# Patient Record
Sex: Male | Born: 1940
Health system: Southern US, Community
[De-identification: ages and names within clinical notes are randomized; demographics above are authoritative.]

## PROBLEM LIST (undated history)

## (undated) DIAGNOSIS — I10 Essential (primary) hypertension: Secondary | ICD-10-CM

## (undated) DIAGNOSIS — I2510 Atherosclerotic heart disease of native coronary artery without angina pectoris: Secondary | ICD-10-CM

## (undated) DIAGNOSIS — I213 ST elevation (STEMI) myocardial infarction of unspecified site: Secondary | ICD-10-CM

## (undated) DIAGNOSIS — S060X9A Concussion with loss of consciousness of unspecified duration, initial encounter: Secondary | ICD-10-CM

## (undated) DIAGNOSIS — M1990 Unspecified osteoarthritis, unspecified site: Secondary | ICD-10-CM

## (undated) DIAGNOSIS — D649 Anemia, unspecified: Secondary | ICD-10-CM

## (undated) DIAGNOSIS — K5790 Diverticulosis of intestine, part unspecified, without perforation or abscess without bleeding: Secondary | ICD-10-CM

## (undated) DIAGNOSIS — D72820 Lymphocytosis (symptomatic): Secondary | ICD-10-CM

## (undated) DIAGNOSIS — I609 Nontraumatic subarachnoid hemorrhage, unspecified: Secondary | ICD-10-CM

## (undated) DIAGNOSIS — I4510 Unspecified right bundle-branch block: Secondary | ICD-10-CM

## (undated) DIAGNOSIS — K635 Polyp of colon: Secondary | ICD-10-CM

## (undated) DIAGNOSIS — Z9889 Other specified postprocedural states: Secondary | ICD-10-CM

## (undated) DIAGNOSIS — E781 Pure hyperglyceridemia: Secondary | ICD-10-CM

## (undated) DIAGNOSIS — G4733 Obstructive sleep apnea (adult) (pediatric): Secondary | ICD-10-CM

## (undated) DIAGNOSIS — N200 Calculus of kidney: Secondary | ICD-10-CM

## (undated) DIAGNOSIS — R112 Nausea with vomiting, unspecified: Secondary | ICD-10-CM

## (undated) DIAGNOSIS — S92309A Fracture of unspecified metatarsal bone(s), unspecified foot, initial encounter for closed fracture: Secondary | ICD-10-CM

## (undated) DIAGNOSIS — S066X9A Traumatic subarachnoid hemorrhage with loss of consciousness of unspecified duration, initial encounter: Secondary | ICD-10-CM

## (undated) DIAGNOSIS — R161 Splenomegaly, not elsewhere classified: Secondary | ICD-10-CM

## (undated) DIAGNOSIS — C8307 Small cell B-cell lymphoma, spleen: Secondary | ICD-10-CM

## (undated) HISTORY — PX: CHOLECYSTECTOMY: SHX55

## (undated) HISTORY — DX: Unspecified right bundle-branch block: I45.10

## (undated) HISTORY — PX: CARDIAC CATHETERIZATION: SHX172

## (undated) HISTORY — PX: TONSILLECTOMY AND ADENOIDECTOMY: SUR1326

## (undated) HISTORY — DX: Small cell b-cell lymphoma, spleen: C83.07

## (undated) HISTORY — DX: Calculus of kidney: N20.0

## (undated) HISTORY — PX: LUMBAR DISC SURGERY: SHX700

## (undated) HISTORY — DX: Fracture of unspecified metatarsal bone(s), unspecified foot, initial encounter for closed fracture: S92.309A

## (undated) HISTORY — DX: Diverticulosis of intestine, part unspecified, without perforation or abscess without bleeding: K57.90

## (undated) HISTORY — PX: HERNIA REPAIR: SHX51

## (undated) HISTORY — DX: Concussion with loss of consciousness of unspecified duration, initial encounter: S06.0X9A

## (undated) HISTORY — DX: Pure hyperglyceridemia: E78.1

## (undated) HISTORY — DX: ST elevation (STEMI) myocardial infarction of unspecified site: I21.3

## (undated) HISTORY — DX: Obstructive sleep apnea (adult) (pediatric): G47.33

## (undated) HISTORY — DX: Nontraumatic subarachnoid hemorrhage, unspecified: I60.9

## (undated) HISTORY — DX: Lymphocytosis (symptomatic): D72.820

## (undated) HISTORY — DX: Polyp of colon: K63.5

## (undated) HISTORY — DX: Splenomegaly, not elsewhere classified: R16.1

## (undated) HISTORY — PX: JOINT REPLACEMENT: SHX530

## (undated) HISTORY — DX: Essential (primary) hypertension: I10

---
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
## 2002-02-07 ENCOUNTER — Encounter: Admission: RE | Admit: 2002-02-07 | Discharge: 2002-02-07 | Payer: Self-pay | Admitting: Family Medicine

## 2002-02-07 ENCOUNTER — Encounter: Payer: Self-pay | Admitting: Family Medicine

## 2002-02-18 ENCOUNTER — Encounter: Admission: RE | Admit: 2002-02-18 | Discharge: 2002-02-18 | Payer: Self-pay | Admitting: Urology

## 2002-02-18 ENCOUNTER — Encounter: Payer: Self-pay | Admitting: Urology

## 2002-03-11 ENCOUNTER — Encounter: Admission: RE | Admit: 2002-03-11 | Discharge: 2002-03-11 | Payer: Self-pay | Admitting: Urology

## 2002-03-11 ENCOUNTER — Encounter: Payer: Self-pay | Admitting: Urology

## 2002-08-25 ENCOUNTER — Encounter: Admission: RE | Admit: 2002-08-25 | Discharge: 2002-08-25 | Payer: Self-pay | Admitting: Family Medicine

## 2002-08-25 ENCOUNTER — Encounter: Payer: Self-pay | Admitting: Family Medicine

## 2002-08-28 ENCOUNTER — Encounter: Payer: Self-pay | Admitting: Family Medicine

## 2002-08-28 ENCOUNTER — Encounter: Admission: RE | Admit: 2002-08-28 | Discharge: 2002-08-28 | Payer: Self-pay | Admitting: Family Medicine

## 2003-02-14 ENCOUNTER — Ambulatory Visit (HOSPITAL_COMMUNITY): Admission: RE | Admit: 2003-02-14 | Discharge: 2003-02-14 | Payer: Self-pay | Admitting: Neurosurgery

## 2003-02-14 ENCOUNTER — Encounter: Payer: Self-pay | Admitting: Neurosurgery

## 2003-03-13 ENCOUNTER — Encounter: Payer: Self-pay | Admitting: Neurosurgery

## 2003-03-13 ENCOUNTER — Encounter: Admission: RE | Admit: 2003-03-13 | Discharge: 2003-03-13 | Payer: Self-pay | Admitting: Neurosurgery

## 2003-03-13 ENCOUNTER — Encounter: Payer: Self-pay | Admitting: Diagnostic Radiology

## 2003-03-27 ENCOUNTER — Encounter: Payer: Self-pay | Admitting: Neurosurgery

## 2003-03-27 ENCOUNTER — Encounter: Admission: RE | Admit: 2003-03-27 | Discharge: 2003-03-27 | Payer: Self-pay | Admitting: Neurosurgery

## 2003-05-08 ENCOUNTER — Encounter: Payer: Self-pay | Admitting: Neurosurgery

## 2003-05-08 ENCOUNTER — Encounter: Admission: RE | Admit: 2003-05-08 | Discharge: 2003-05-08 | Payer: Self-pay | Admitting: Neurosurgery

## 2005-04-05 ENCOUNTER — Ambulatory Visit: Payer: Self-pay | Admitting: Gastroenterology

## 2005-04-27 ENCOUNTER — Ambulatory Visit: Payer: Self-pay | Admitting: Gastroenterology

## 2006-09-12 ENCOUNTER — Ambulatory Visit: Payer: Self-pay | Admitting: Internal Medicine

## 2006-09-20 ENCOUNTER — Ambulatory Visit: Payer: Self-pay | Admitting: Cardiology

## 2006-12-20 ENCOUNTER — Ambulatory Visit: Payer: Self-pay | Admitting: Gastroenterology

## 2007-01-08 ENCOUNTER — Encounter (INDEPENDENT_AMBULATORY_CARE_PROVIDER_SITE_OTHER): Payer: Self-pay | Admitting: Gastroenterology

## 2007-01-08 ENCOUNTER — Ambulatory Visit: Payer: Self-pay | Admitting: Gastroenterology

## 2007-11-11 ENCOUNTER — Ambulatory Visit (HOSPITAL_COMMUNITY): Admission: RE | Admit: 2007-11-11 | Discharge: 2007-11-11 | Payer: Self-pay | Admitting: Family Medicine

## 2008-01-02 ENCOUNTER — Ambulatory Visit (HOSPITAL_COMMUNITY): Admission: RE | Admit: 2008-01-02 | Discharge: 2008-01-03 | Payer: Self-pay | Admitting: Neurosurgery

## 2008-01-12 ENCOUNTER — Inpatient Hospital Stay (HOSPITAL_COMMUNITY): Admission: EM | Admit: 2008-01-12 | Discharge: 2008-01-17 | Payer: Self-pay | Admitting: Emergency Medicine

## 2008-02-20 ENCOUNTER — Encounter: Admission: RE | Admit: 2008-02-20 | Discharge: 2008-02-20 | Payer: Self-pay | Admitting: Neurosurgery

## 2008-06-24 ENCOUNTER — Encounter: Admission: RE | Admit: 2008-06-24 | Discharge: 2008-06-24 | Payer: Self-pay | Admitting: Neurosurgery

## 2010-12-27 NOTE — Op Note (Signed)
NAME:  Marcus Taylor, SCHULKE NO.:  [id number]   MEDICAL RECORD NO.:  [id number]          PATIENT TYPE:  INP   LOCATION:  3029                         FACILITY:  [name]   PHYSICIAN:  Reinaldo Meeker, M.D. DATE OF BIRTH:  [birth year]   DATE OF PROCEDURE:  01/02/2008  DATE OF DISCHARGE:                               OPERATIVE REPORT   PREOPERATIVE DIAGNOSIS:  Herniated disk L3-L4, central.   POSTOPERATIVE DIAGNOSIS:  Herniated disk L3-L4, central.   PROCEDURES:  Bilateral L3-L4 microdiskectomy followed by L3-L4  interspinous fusion with bony spacer, autologous bone, and Osteocel  followed by L3-L4 nonsegmental instrumentation with spinous process  plate.   SURGEON:  Reinaldo Meeker, MD   ASSISTANT:  Donalee Citrin, MD   PROCEDURE IN DETAIL:  After placing in the prone position, the patient's  back was prepped and draped in usual sterile fashion.  Localizing  fluoroscopy was used prior to incision to identify the appropriate  level.  A midline incision was made above the spinous processes of L3  and L4.  Using Bovie cutting current, an incision was carried on the  spinous processes.  Subperiosteal dissection was then carried out  bilaterally along the spinous processes and lamina.  Self-retaining  retractor was placed for exposure.  X-ray showed approach to the  appropriate level.  Starting on the patient's left side, generous  laminotomy was performed by removing the inferior one-half of the L3  lamina, the medial one-third of the section of the superior one-half of  the L4 lamina.  Residual bone was removed and saved for use later in the  case.  Ligamentum flavum was removed in a piecemeal fashion.  Similar  decompression was then carried on the right, once again removing the  inferior half of the L3 lamina and medial one-third of the section of  the superior one-half of the L4 lamina.  Once again, residual bone was  removed and saved for use later in the case.   Interspinous ligament at  this time was removed.  At this time, microdiskectomy was carried out  starting the patient's right side.  Thorough disk space clean-up was  carried out with great care being paid to the midline at the disk space  and just superior to where large fragments of disk materials were  removed.  Progressively, more relaxation of the thecal sac was obtained.  Attention was then turned to the opposite side.  Once again,  microdiskectomy was carried out after coagulating the areas and cutting  it.  Once again, thorough disk space clean-up was carried out.  On the  same time, great care was taken to avoid injury to the neural elements  and this was successfully done.  At this point, inspections were carried  out in all directions for any evidence of significant residual  compression and noncompliance identified.  Large amounts of irrigation  were carried out and any bleeding controlled with bipolar coagulation  and Gelfoam.  Sizer for the interspinous fusion was carried out and  appropriate size was obtained.  The spacer was then placed without  difficulty.  Autologous  bone and Osteocel were placed on top of the  spacer and through the spinous processes around them to help with the  fusion.  An appropriate length interspinous plate was then chosen.  Once  confirming good location under fluoroscopy, the plate was clamped tight,  and final fluoroscopy in the AP and lateral direction showed the plate  and spacer all to be in good position.  Large amounts of irrigation  had already been carried out.  The wound was then closed in multiple  layers of Vicryl and the muscle fascia, subcutaneous and subcuticular  tissues, and staples were placed on the skin.  A sterile dressing was  then applied, and the patient was extubated and taken to recovery room  in stable condition.           ______________________________  Reinaldo Meeker, M.D.     ROK/MEDQ  D:  01/02/2008  T:   01/03/2008  Job:  [id number]

## 2010-12-27 NOTE — Discharge Summary (Signed)
NAMECOBURN, KNAUS NO.:  [id number]   MEDICAL RECORD NO.:  [id number]          PATIENT TYPE:  INP   LOCATION:  3009                         FACILITY:  [name]   PHYSICIAN:  Reinaldo Meeker, M.D. DATE OF BIRTH:  Nov 12, 1940   DATE OF ADMISSION:  01/12/2008  DATE OF DISCHARGE:  01/17/2008                               DISCHARGE SUMMARY   PRIMARY DIAGNOSIS:  Postoperative wound infection.   PRIMARY OPERATIVE PROCEDURE:  I&D of lumbar wound, extension of lumbar  decompression, and removal of instrumentation.   HISTORY:  [name] is a 70 year old gentleman who is approximately  10 days out from an L3 diskectomy fusion and intraspinous plating.  He  developed some drainage from his wound consistent with a wound  infection.  He was admitted and taken to the operating room where he  underwent I&D of his lumbar wound and extension of his lumbar  decompression.  The patient tolerated the procedure well and was placed  on broad-spectrum antibiotics.  Over subsequent days, the wound looked  much better.  He had a PICC line placed.  The wound grew staph species  so that he be discharged home with outpatient vancomycin.  He is feeling  much better than he was at the time of admission.   Plan was for the patient to return to the office in 5 days time for a  wound check but his condition was markedly improved.           ______________________________  Reinaldo Meeker, M.D.     ROK/MEDQ  D:  02/20/2008  T:  02/21/2008  Job:  [id number]

## 2010-12-27 NOTE — Op Note (Signed)
Marcus Taylor, SCHEAFFER NO.:  [id number]   MEDICAL RECORD NO.:  [id number]          PATIENT TYPE:  INP   LOCATION:  3009                         FACILITY:  [name]   PHYSICIAN:  Reinaldo Meeker, M.D. DATE OF BIRTH:  12-12-1940   DATE OF PROCEDURE:  01/14/2008  DATE OF DISCHARGE:                               OPERATIVE REPORT   PREOPERATIVE DIAGNOSIS:  Lumbar wound infection.   POSTOPERATIVE DIAGNOSIS:  Lumbar wound infection.   PROCEDURE:  I&D of the lumbar wound followed by complete laminectomy L3  with removal of residual bone and removal of residual L4 lamina for  decompression.   SECONDARY PROCEDURES:  Repair of dural tear.   SURGEON:  Reinaldo Meeker, MD   PROCEDURE IN DETAIL:  After being placed in the prone position, the  patient's back was prepped and draped in usual sterile fashion.  Previous lumbar incision was reopened and cultures were taken of the  purulent material encountered superficially.  This was cleaned out  thoroughly.  The fascia was then opened and the dissection taken down to  the spinous process plate.  This was loosened without difficulty and  removed without difficulty.  Interspinous bony spacer was also removed  as well.  Any Gelfoam and organized hematoma on the dura was removed, so  the dura was well visualized.  At this time, spinous processes of L3-L4  were removed.  The remainder of the lamina of L3 was removed all the way  up until the L2-L3 interspace.  Similar procedure was carried out at L4,  carrying this completely down to the interspace of L4-L5 in order to  thoroughly decompress the spinal dura.  The disk space at L3-L4 was once  again cleaned out.  Inspection superior showed a bit of superior  fragment of disk and this was removed.  In the process of removing it,  small dural tear was encountered and this was repaired with 1 Prolene  stitch and Duragen at the end of the case.  At this time, large amounts  of  irrigation was carried out once the decompression was felt to be  complete.  A 3 L bag of antibiotic irrigation was carried out followed  by another 500 used in septal.  Because of the dural tear, the Hemovac  drain was not left subfascial but was left above the fascia.  The fascia  was then closed with interrupted Vicryl stitches.  The Hemovac drain was  left in the superfascial space and brought out through a separate stab  incision.  The rest of the wound was then irrigated and closed with  Vicryl on the subcutaneous and subcuticular tissues and staples were  placed on the skin.  A sterile dressing was then applied.  The patient  was extubated and taken to recovery room in stable condition.           ______________________________  Reinaldo Meeker, M.D.     ROK/MEDQ  D:  01/14/2008  T:  01/14/2008  Job:  [id number]

## 2011-05-10 LAB — CBC
Hemoglobin: 10.9 — ABNORMAL LOW
MCHC: 34.1
Platelets: 298
Platelets: 312
RDW: 14.7
WBC: 15.8 — ABNORMAL HIGH

## 2011-05-10 LAB — DIFFERENTIAL
Basophils Absolute: 0
Basophils Relative: 0
Eosinophils Relative: 0
Monocytes Absolute: 0.9
Neutro Abs: 6.4

## 2011-05-10 LAB — C-REACTIVE PROTEIN: CRP: 8 — ABNORMAL HIGH (ref <0.6)

## 2011-05-10 LAB — BASIC METABOLIC PANEL
BUN: 15
Calcium: 8.6
Creatinine, Ser: 1.11
GFR calc Af Amer: >60
GFR calc non Af Amer: >60

## 2011-05-10 LAB — POCT I-STAT, CHEM 8
Calcium, Ion: 1.13
Glucose, Bld: 188 — ABNORMAL HIGH
HCT: 33 — ABNORMAL LOW
Hemoglobin: 11.2 — ABNORMAL LOW
Potassium: 3.3 — ABNORMAL LOW

## 2011-05-10 LAB — WOUND CULTURE

## 2011-05-10 LAB — GRAM STAIN

## 2011-05-10 LAB — SEDIMENTATION RATE
Sed Rate: 36 — ABNORMAL HIGH
Sed Rate: 36 — ABNORMAL HIGH

## 2011-05-11 LAB — GRAM STAIN

## 2011-05-11 LAB — ANAEROBIC CULTURE

## 2011-05-11 LAB — CULTURE, ROUTINE-ABSCESS

## 2011-10-24 ENCOUNTER — Encounter: Payer: Self-pay | Admitting: Gastroenterology

## 2011-11-24 ENCOUNTER — Telehealth: Payer: Self-pay | Admitting: Oncology

## 2011-11-24 NOTE — Telephone Encounter (Signed)
Gv pt appt for 04/22 will fax over a letter to Dr. Foy [place] office with appt d/t

## 2011-11-27 ENCOUNTER — Telehealth: Payer: Self-pay | Admitting: Oncology

## 2011-11-27 NOTE — Telephone Encounter (Signed)
Referred by Dr. Foy [place] Dx- Chronic Lymphocytosis

## 2011-11-30 ENCOUNTER — Encounter: Payer: Self-pay | Admitting: Oncology

## 2011-11-30 DIAGNOSIS — E119 Type 2 diabetes mellitus without complications: Secondary | ICD-10-CM | POA: Insufficient documentation

## 2011-11-30 DIAGNOSIS — I10 Essential (primary) hypertension: Secondary | ICD-10-CM | POA: Insufficient documentation

## 2011-11-30 DIAGNOSIS — D72820 Lymphocytosis (symptomatic): Secondary | ICD-10-CM | POA: Insufficient documentation

## 2011-12-04 ENCOUNTER — Ambulatory Visit: Payer: Self-pay

## 2011-12-04 ENCOUNTER — Other Ambulatory Visit (HOSPITAL_BASED_OUTPATIENT_CLINIC_OR_DEPARTMENT_OTHER): Payer: Medicare Other | Admitting: Lab

## 2011-12-04 ENCOUNTER — Telehealth: Payer: Self-pay | Admitting: Oncology

## 2011-12-04 ENCOUNTER — Ambulatory Visit (HOSPITAL_BASED_OUTPATIENT_CLINIC_OR_DEPARTMENT_OTHER): Payer: Medicare Other | Admitting: Oncology

## 2011-12-04 ENCOUNTER — Encounter: Payer: Self-pay | Admitting: Oncology

## 2011-12-04 ENCOUNTER — Other Ambulatory Visit (HOSPITAL_COMMUNITY)
Admission: RE | Admit: 2011-12-04 | Discharge: 2011-12-04 | Disposition: A | Discharge status: Home / Self Care | Payer: Medicare Other | Source: Ambulatory Visit | Attending: Oncology | Admitting: Oncology

## 2011-12-04 VITALS — BP 120/70 | HR 53 | Temp 98.3°F | Ht 69.0 in | Wt 222.1 lb

## 2011-12-04 DIAGNOSIS — D72820 Lymphocytosis (symptomatic): Secondary | ICD-10-CM | POA: Insufficient documentation

## 2011-12-04 DIAGNOSIS — R161 Splenomegaly, not elsewhere classified: Secondary | ICD-10-CM

## 2011-12-04 DIAGNOSIS — D7289 Other specified disorders of white blood cells: Secondary | ICD-10-CM

## 2011-12-04 DIAGNOSIS — I10 Essential (primary) hypertension: Secondary | ICD-10-CM

## 2011-12-04 DIAGNOSIS — E119 Type 2 diabetes mellitus without complications: Secondary | ICD-10-CM

## 2011-12-04 LAB — CBC WITH DIFFERENTIAL/PLATELET
BASO%: 0.3 % (ref 0.0–2.0)
EOS%: 1.9 % (ref 0.0–7.0)
HCT: 37.8 % — ABNORMAL LOW (ref 38.4–49.9)
LYMPH%: 71.6 % — ABNORMAL HIGH (ref 14.0–49.0)
MCH: 26.9 pg — ABNORMAL LOW (ref 27.2–33.4)
MCHC: 33.1 g/dL (ref 32.0–36.0)
NEUT%: 21.7 % — ABNORMAL LOW (ref 39.0–75.0)
lymph#: 8.4 10*3/uL — ABNORMAL HIGH (ref 0.9–3.3)

## 2011-12-04 LAB — MORPHOLOGY: PLT EST: ADEQUATE

## 2011-12-04 LAB — CHCC SMEAR

## 2011-12-04 LAB — LACTATE DEHYDROGENASE: LDH: 157 U/L (ref 94–250)

## 2011-12-04 NOTE — Progress Notes (Signed)
[facility]  Telephone:(336) [phone] Fax:(336) [phone]     INITIAL HEMATOLOGY CONSULTATION    Referral MD:  Dr. Marinda Elk, M.D.  Reason for Referral: lymphocyte-predominant leukocytosis.     HPI: Marcus Taylor is a 71 year-old man with PMH with DM, type II, HTN, diverticulosis.  He has had leukocytosis as far back as 01/19/2005 when his WBC was 11.5; Hgb 13.8; Plt 245.  There was no differential with this CBC.  His most recent CBC from 11/13/2011 showed WBC 11.9; Hgb 12.6; Plt 214.  Differential showed lymph of 73%.  He was thus kindly referred to the [facility] for evaluation.  Marcus Taylor presented to the clinic for the first time today with his wife.  He reported feeling well.  He has intermittent mild lower bilateral quadrants/upper pelvic abdominal cramp for the past few months.  This is very mild.  There is no relation with eating, bowel/bladder movement, or activity.  He does not need pain med.  The pain does not radiate.  There is no diarrhea, constipation, or bleeding symptoms.   Patient denies fatigue, headache, visual changes, confusion, drenching night sweats, palpable lymph node swelling, mucositis, odynophagia, dysphagia, nausea vomiting, jaundice, chest pain, palpitation, shortness of breath, dyspnea on exertion, productive cough, gum bleeding, epistaxis, hematemesis, hemoptysis, abdominal swelling, early satiety, melena, hematochezia, hematuria, skin rash, spontaneous bleeding, joint swelling, joint pain, heat or cold intolerance, bowel bladder incontinence, back pain, focal motor weakness, paresthesia, depression, suicidal or homocidal ideation, feeling hopelessness.     Past Medical History  Diagnosis Date  . Lymphocytosis   . Diabetes mellitus   . HTN (hypertension)   . Hypertriglyceridemia   . Metabolic syndrome   . Diverticulosis   . Kidney stone   . OSA (obstructive sleep apnea)   . Colonic polyp   :    Past Surgical History    Procedure Date  . Lumbar disc surgery   . Hernia repair   . Cholecystectomy   :   CURRENT MEDS: Current Outpatient Prescriptions  Medication Sig Dispense Refill  . aspirin 325 MG tablet Take 325 mg by mouth daily.      Marland Kitchen gemfibrozil (LOPID) 600 MG tablet Take by mouth Twice daily.      . meloxicam (MOBIC) 15 MG tablet Take by mouth Daily.      . quinapril-hydrochlorothiazide (ACCURETIC) 20-25 MG per tablet Take by mouth Daily.          No Known Allergies:  Family History  Problem Relation Age of Onset  . Cancer Sister     kidney cancer; and then esophageal cancer  . Cancer Brother     colon cancer; then bladder cancer  :  History   Social History  . Marital Status: Married    Spouse Name: N/A    Number of Children: 2  . Years of Education: N/A   Occupational History  .      used to work as an Chief Technology Officer; now retired   Social History Main Topics  . Smoking status: Former Smoker -- 1.0 packs/day for 20 years    Quit date: 08/14/1988  . Smokeless tobacco: Not on file  . Alcohol Use: No  . Drug Use: No  . Sexually Active: Yes    Birth Control/ Protection: None   Other Topics Concern  . Not on file   Social History Narrative  . No narrative on file  :  REVIEW OF SYSTEM:  The rest of  the 14-point review of sytem was negative.   Exam: ECOG 0.   General:  Mildly obese man in no acute distress.  Eyes:  no scleral icterus.  ENT:  There were no oropharyngeal lesions.  Neck was without thyromegaly.  Lymphatics:  Negative cervical, supraclavicular or axillary adenopathy.  Respiratory: lungs were clear bilaterally without wheezing or crackles.  Cardiovascular:  Regular rate and rhythm, S1/S2, without murmur, rub or gallop.  There was no pedal edema.  GI:  abdomen was soft, flat, nontender, nondistended, without hepatomegaly.  There was splenomegaly about 5 cm below costal margin.  Muscoloskeletal:  no spinal tenderness of palpation of vertebral spine.  Skin exam  was without echymosis, petichae.  Neuro exam was nonfocal.  Patient was able to get on and off exam table without assistance.  Gait was normal.  Patient was alerted and oriented.  Attention was good.   Language was appropriate.  Mood was normal without depression.  Speech was not pressured.  Thought content was not tangential.    LABS:  Lab Results  Component Value Date   WBC 11.7* 12/04/2011   HGB 12.5* 12/04/2011   HCT 37.8* 12/04/2011   PLT 177 12/04/2011   GLUCOSE 131* 01/12/2008   NA 136 01/12/2008   K 3.3* 01/12/2008   CL 100 01/12/2008   CREATININE 1.11 01/12/2008   BUN 15 01/12/2008   CO2 29 01/12/2008    Blood smear review:   I personally reviewed the patient's peripheral blood smear today.  There was isocytosis.  There was no peripheral blast.  There was increased in lymphocytes.  Some lymphocytes had cytoplastic projections.  There was no schistocytosis, spherocytosis, target cell, rouleaux formation, tear drop cell.  There was no giant platelets or platelet clumps.      ASSESSMENT AND PLAN:   1.  Hypertension:  Well controlled on Accuretic per PCP.   2.  DM:  Diet controlled per PCP.   3.  Hypertriglyceridemia:  On Lopid per PCP.   4.  Diverticulosis: stable.  No history of diverticulitis.  I query if his abdominal discomfort is due to this.  He has no fever or elevated neutrophil to raise suspicion for diverticulitis at this time.  If his abdominal pain worsens, further work up may be considered.   5.  Lymphocyte-predominant leukocytosis; as far as 2006; possibly longer. - Differential diagnosis:   Reactive vs indolent lymphoma/leukemia (CLL chronic lymphoid leukemia; marginal zone lymphoma; hairy cell leukemia).  - Work up:  I sent for flow cytometry.   If this is non diagnostic, I will recommend bone marrow biopsy.   - Treatment:  Most of these aforementioned lymphoma are very slow growing, cannot be cured with chemo.  Chemo are effective; however, remission is not  permanent.    So, only patients are only treated with chemo if they have severe B-symptoms, very large lymph node, severe cytopenia, or  recurrent infections).    6.  Enlarged spleen:  Most likely due to indolent lymphoma.  - Work up:  I requested complete abdominal ultrasound to confirm enlarged spleen, and to rule out cirrhosis, ascites, and other pathologies.   7.  Follow up:   In about 7-10 days to discuss result of work up and definitive treatment recommendation.     The length of time of the face-to-face encounter was 45 minutes. More than 50% of time was spent counseling and coordination of care.  Than,  you for this referral.

## 2011-12-04 NOTE — Telephone Encounter (Signed)
gv pt appt schedule for may and [place] for 4/29.

## 2011-12-04 NOTE — Patient Instructions (Addendum)
1.  Issue:  Lymphocyte-predominant leukocytosis: - Possibility:  Reactive vs slow growing/indolent lymphoma/leukemia (CLL chronic lymphoid leukemia; marginal zone lymphoma; hairy cell leukemia).  - Diagnosis:  Pending flow cytometry.   If this is non diagnostic, I will recommend bone marrow biopsy.   - Most of these aforementioned lymphoma are very slow growing, cannot be cured with chemo.  Chemo are effective; however, they tend to recur.   So, only a few patients with these lymphoma/leukemia need to be treated (severe fatigue, very large lymph node, severe low blood count recurrent infections).  - Follow up:  In about 7-10 days to discuss result of work up.    2.  Enlarged spleen:  Most likely due to #1.  - Diagnosis:  Ultrasound to confirm enlarged spleen.

## 2011-12-05 ENCOUNTER — Telehealth: Payer: Self-pay | Admitting: Gastroenterology

## 2011-12-05 NOTE — Telephone Encounter (Signed)
Dr. Arlyce Dice the pt states his sister has been diagnosed with Colon cancer. Pt wants to know if they need to repeat their colon sooner than recall date. Please advise.

## 2011-12-05 NOTE — Telephone Encounter (Signed)
She should have a colonoscopy 5 years after her prior colonoscopy

## 2011-12-05 NOTE — Telephone Encounter (Signed)
Pt scheduled for previsit 12/11/11@4pm , colon scheduled for 12/19/11@9 :30am. Pt aware of appt date and time.

## 2011-12-06 LAB — FLOW CYTOMETRY

## 2011-12-11 ENCOUNTER — Other Ambulatory Visit: Payer: Self-pay | Admitting: Oncology

## 2011-12-11 ENCOUNTER — Ambulatory Visit (AMBULATORY_SURGERY_CENTER): Payer: Medicare Other | Admitting: *Deleted

## 2011-12-11 ENCOUNTER — Ambulatory Visit (HOSPITAL_COMMUNITY)
Admission: RE | Admit: 2011-12-11 | Discharge: 2011-12-11 | Disposition: A | Discharge status: Home / Self Care | Payer: Medicare Other | Source: Ambulatory Visit | Attending: Oncology | Admitting: Oncology

## 2011-12-11 VITALS — Ht 71.0 in | Wt 225.7 lb

## 2011-12-11 DIAGNOSIS — I10 Essential (primary) hypertension: Secondary | ICD-10-CM

## 2011-12-11 DIAGNOSIS — Z1211 Encounter for screening for malignant neoplasm of colon: Secondary | ICD-10-CM

## 2011-12-11 DIAGNOSIS — Z9089 Acquired absence of other organs: Secondary | ICD-10-CM | POA: Insufficient documentation

## 2011-12-11 DIAGNOSIS — D72820 Lymphocytosis (symptomatic): Secondary | ICD-10-CM

## 2011-12-11 DIAGNOSIS — Z800 Family history of malignant neoplasm of digestive organs: Secondary | ICD-10-CM

## 2011-12-11 DIAGNOSIS — E119 Type 2 diabetes mellitus without complications: Secondary | ICD-10-CM | POA: Insufficient documentation

## 2011-12-11 DIAGNOSIS — R161 Splenomegaly, not elsewhere classified: Secondary | ICD-10-CM

## 2011-12-11 MED ORDER — PEG-KCL-NACL-NASULF-NA ASC-C 100 G PO SOLR: 1.0000 | Freq: Once | ORAL | Status: DC

## 2011-12-12 ENCOUNTER — Encounter: Payer: Self-pay | Admitting: Gastroenterology

## 2011-12-18 ENCOUNTER — Telehealth: Payer: Self-pay | Admitting: Oncology

## 2011-12-18 ENCOUNTER — Ambulatory Visit (HOSPITAL_BASED_OUTPATIENT_CLINIC_OR_DEPARTMENT_OTHER): Payer: Medicare Other | Admitting: Oncology

## 2011-12-18 ENCOUNTER — Encounter: Payer: Self-pay | Admitting: Oncology

## 2011-12-18 VITALS — BP 118/77 | HR 60 | Temp 96.6°F | Ht 71.0 in | Wt 223.9 lb

## 2011-12-18 DIAGNOSIS — M545 Low back pain, unspecified: Secondary | ICD-10-CM

## 2011-12-18 DIAGNOSIS — C8307 Small cell B-cell lymphoma, spleen: Secondary | ICD-10-CM

## 2011-12-18 DIAGNOSIS — I10 Essential (primary) hypertension: Secondary | ICD-10-CM

## 2011-12-18 HISTORY — DX: Small cell b-cell lymphoma, spleen: C83.07

## 2011-12-18 NOTE — Progress Notes (Signed)
[facility]  Telephone:(336) [phone] Fax:(336) [phone]   OFFICE PROGRESS NOTE   Cc:  FRIED, Marcus Cheadle, MD, MD  DIAGNOSIS:  splenic marginal zone B-cell lymphoma  CURRENT THERAPY:  Watchful observation.   INTERVAL HISTORY: Marcus Taylor 71 y.o. male returns for regular follow up with his wife to discuss result of work up from the first visit.  He reports again chronic lower back pain; sometime, it radiates to the bilateral paraspinal areas.  Pain is mild to moderate; crampy; improves with Tylenol.  He denies leg weakness, paresthesia; bowel bladder incontinence.  He denies anorexia, weight loss, SOB, chest pain, palpitation, early satiety, palpable node swelling.   Past Medical History  Diagnosis Date  . Lymphocytosis   . Diabetes mellitus   . HTN (hypertension)   . Hypertriglyceridemia   . Metabolic syndrome   . Diverticulosis   . Kidney stone   . OSA (obstructive sleep apnea)   . Colonic polyp   . Splenic marginal zone b-cell lymphoma 12/18/2011    Past Surgical History  Procedure Date  . Lumbar disc surgery   . Hernia repair   . Cholecystectomy     Current Outpatient Prescriptions  Medication Sig Dispense Refill  . aspirin 325 MG tablet Take 325 mg by mouth daily.      Marland Kitchen gemfibrozil (LOPID) 600 MG tablet Take by mouth Twice daily.      . meloxicam (MOBIC) 15 MG tablet Take by mouth Daily.      . peg 3350 powder (MOVIPREP) 100 G SOLR Take 1 kit (100 g total) by mouth once.  1 kit  0  . quinapril-hydrochlorothiazide (ACCURETIC) 20-25 MG per tablet Take by mouth Daily.        ALLERGIES:   has no known allergies.  REVIEW OF SYSTEMS:  The rest of the 14-point review of system was negative.   Filed Vitals:   12/18/11 1401  BP: 118/77  Pulse: 60  Temp: 96.6 F (35.9 C)   Wt Readings from Last 3 Encounters:  12/18/11 223 lb 14.4 oz (101.56 kg)  12/11/11 225 lb 11.2 oz (102.377 kg)  12/04/11 222 lb 1.6 oz (100.744 kg)   ECOG Performance  status:   PHYSICAL EXAMINATION:     General: Mildly obese man in no acute distress. Eyes: no scleral icterus. ENT: There were no oropharyngeal lesions. Neck was without thyromegaly. Lymphatics: Negative cervical, supraclavicular or axillary adenopathy. Respiratory: lungs were clear bilaterally without wheezing or crackles. Cardiovascular: Regular rate and rhythm, S1/S2, without murmur, rub or gallop. There was no pedal edema. GI: abdomen was soft, flat, nontender, nondistended.  Due to his body habitus, I could not appreciate his splenomegaly.  There was splenomegaly about 5 cm below costal margin. Muscoloskeletal: no spinal tenderness of palpation of vertebral spine. Skin exam was without echymosis, petichae. Neuro exam was nonfocal. Patient was able to get on and off exam table without assistance. Gait was normal. Patient was alerted and oriented. Attention was good. Language was appropriate. Mood was normal without depression. Speech was not pressured. Thought content was not tangential.        LABORATORY/RADIOLOGY DATA:  Lab Results  Component Value Date   WBC 11.7* 12/04/2011   HGB 12.5* 12/04/2011   HCT 37.8* 12/04/2011   PLT 177 12/04/2011   GLUCOSE 131* 01/12/2008   NA 136 01/12/2008   K 3.3* 01/12/2008   CL 100 01/12/2008   CREATININE 1.11 01/12/2008   BUN 15 01/12/2008   CO2 29  01/12/2008    US Abdomen Complete  12/11/2011  *RADIOLOGY REPORT*  Clinical Data:  Splenomegaly felt on physical exam.  History of leukocytosis.  Diabetes.  Hypertension.  COMPLETE ABDOMINAL ULTRASOUND  Comparison:  None.  Findings:  Gallbladder:  Surgically absent.  Common bile duct: Normal, 7 mm.  Liver: Normal in echogenicity, without focal lesion.  IVC: Negative  Pancreas:  Poorly visualized due to overlying bowel gas.  Spleen:  Splenomegaly.  The spleen measures 18.6 cm in greatest cranial caudal dimension.  22.3 x 8.4 cm transverse.  Splenic volume of 1836.  At least one echogenic focus is identified  within. Mild posterior acoustic shadowing suspected.  Image 13.  Right Kidney:  11.1 cm. No hydronephrosis.  Left Kidney:  11.6 cm. No hydronephrosis.  Abdominal aorta:  Nonaneurysmal without ascites.  IMPRESSION: Splenomegaly.  Echogenic focus within is favored to be related to old granulomatous disease.  No other significant abnormality within the abdomen identified.  Original Report Authenticated By: Consuello Bossier, M.D.     ASSESSMENT AND PLAN:    1. Hypertension: Well controlled on Accuretic per PCP.   2. DM: Diet controlled per PCP.   3. Hypertriglyceridemia: On Lopid per PCP.   4. Diverticulosis: stable. No history of diverticulitis.   5. Lymphocyte-predominant leukocytosis and splenomegaly:  Newly diagnosed splenic marginal zone B-cell lymphoma (an indolent subtype of Non-Hodgkin's lymphoma). - Since chemo therapy does not cure these type of slow growing lymphoma, not all patients requires treatment right away.  - Indications for treatment:  Severe constitutional symptoms, low blood count, recurrent infection, very bulky disease causing LUQ abdominal pain or early satiety.  - Recommendation:  He does not have any of these indication.  Thus,  I recommended observation at this point.  [name] and his wife expressed informed understanding and wished to proceed with observation as well.   6.  Chronic lower back pain:  Most likely 2/2 OA.  He had lumbar surgery in the past.  However, pain now is also off the side.  He had history of nephrolithiasis.  The pain quality is unlike nephrolithiasis.  [place] cannot rule out kidney stone.  I advised him to follow up with his urologist if the pain worsens, radiates, or there is hematuria.   7.  Follow up: - Lab only appointment in about 2-3 months. - RV with my nurse practitioner in about 4-5 months.     The length of time of the face-to-face encounter was 15 minutes. More than 50% of time was spent counseling and coordination of care.

## 2011-12-18 NOTE — Telephone Encounter (Signed)
appts made and printed for pt aom °

## 2011-12-18 NOTE — Patient Instructions (Signed)
1.  Diagnosis:  Splenic marginal zone B-cell lymphoma (an indolent subtype of Non-Hodgkin's lymphoma). - Since chemo therapy does not cure these type of slow growing lymphoma, not all patients require treatment right away.  - Indications for treatment:  Severe constitutional symptoms, low blood count, recurrent infection, very bulky disease.  - Recommendation:  Observation at this point.  2.  Follow up: - Lab only appointment in about 2-3 months. - RV with my nurse practitioner in about 4-5 months.

## 2011-12-19 ENCOUNTER — Ambulatory Visit (AMBULATORY_SURGERY_CENTER): Payer: Medicare Other | Admitting: Gastroenterology

## 2011-12-19 ENCOUNTER — Encounter: Payer: Self-pay | Admitting: Gastroenterology

## 2011-12-19 VITALS — BP 116/62 | HR 53 | Temp 97.1°F | Resp 16 | Ht 71.0 in | Wt 225.0 lb

## 2011-12-19 DIAGNOSIS — Z800 Family history of malignant neoplasm of digestive organs: Secondary | ICD-10-CM

## 2011-12-19 DIAGNOSIS — Z1211 Encounter for screening for malignant neoplasm of colon: Secondary | ICD-10-CM

## 2011-12-19 DIAGNOSIS — Z86010 Personal history of colonic polyps: Secondary | ICD-10-CM

## 2011-12-19 DIAGNOSIS — K5730 Diverticulosis of large intestine without perforation or abscess without bleeding: Secondary | ICD-10-CM

## 2011-12-19 MED ORDER — SODIUM CHLORIDE 0.9 % IV SOLN: 500.0000 mL | INTRAVENOUS | Status: DC

## 2011-12-19 NOTE — Progress Notes (Signed)
Patient did not experience any of the following events: a burn prior to discharge; a fall within the facility; wrong site/side/patient/procedure/implant event; or a hospital transfer or hospital admission upon discharge from the facility. (G8907) Patient did not have preoperative order for IV antibiotic SSI prophylaxis. ([id number])  

## 2011-12-19 NOTE — Progress Notes (Signed)
The pt tolerated the colonoscopy very well. Maw   

## 2011-12-19 NOTE — Op Note (Signed)
[facility] [address] [place], [place]  [address]  COLONOSCOPY PROCEDURE REPORT  PATIENT:  Marcus Taylor, Marcus Taylor  MR#:  [id number] BIRTHDATE:  16-May-1941, 70 yrs. old  GENDER:  male ENDOSCOPIST:  Barbette Hair. Arlyce Dice, MD REF. BY:  Marinda Elk, M.D. PROCEDURE DATE:  12/19/2011 PROCEDURE:  Diagnostic Colonoscopy ASA CLASS:  Class II INDICATIONS:  Screening, family history of colon cancer Sister with colon Ca Index polypectomy 2002; 2008 colo negative for polyps MEDICATIONS:   MAC sedation, administered by CRNA propofol 140mg IV  DESCRIPTION OF PROCEDURE:   After the risks benefits and alternatives of the procedure were thoroughly explained, informed consent was obtained.  Digital rectal exam was performed and revealed no abnormalities.   The LB [id number] endoscope was introduced through the anus and advanced to the cecum, which was identified by both the appendix and ileocecal valve, without limitations.  The quality of the prep was excellent, using MoviPrep.  The instrument was then slowly withdrawn as the colon was fully examined. <<PROCEDUREIMAGES>>  FINDINGS:  Moderate diverticulosis was found in the sigmoid colon (see image2).  Scattered diverticula were found. descending to ascending colon  Internal Hemorrhoids were found (see image7). This was otherwise a normal examination of the colon (see image3). Retroflexed views in the rectum revealed no abnormalities.    The time to cecum =  1) 2.75  minutes. The scope was then withdrawn in 1) 8  minutes from the cecum and the procedure completed. COMPLICATIONS:  None ENDOSCOPIC IMPRESSION: 1) Moderate diverticulosis in the sigmoid colon 2) Diverticula, scattered 3) Internal hemorrhoids 4) Otherwise normal examination RECOMMENDATIONS: 1) Given your significant family history of colon cancer, you should have a repeat colonoscopy in 5 years REPEAT EXAM:  In 5 year(s) for  Colonoscopy.  ______________________________ Barbette Hair. Arlyce Dice, MD  CC:  n. eSIGNED:   Barbette Hair. Maeryn Mcgath at 12/19/2011 10:03 AM  Oralia Rud, [phone]

## 2011-12-19 NOTE — Patient Instructions (Signed)
YOU HAD AN ENDOSCOPIC PROCEDURE TODAY AT THE [facility]: Refer to the procedure report that was given to you for any specific questions about what was found during the examination.  If the procedure report does not answer your questions, please call your gastroenterologist to clarify.  If you requested that your care partner not be given the details of your procedure findings, then the procedure report has been included in a sealed envelope for you to review at your convenience later.  YOU SHOULD EXPECT: Some feelings of bloating in the abdomen. Passage of more gas than usual.  Walking can help get rid of the air that was put into your GI tract during the procedure and reduce the bloating. If you had a lower endoscopy (such as a colonoscopy or flexible sigmoidoscopy) you may notice spotting of blood in your stool or on the toilet paper. If you underwent a bowel prep for your procedure, then you may not have a normal bowel movement for a few days.  DIET: Your first meal following the procedure should be a light meal and then it is ok to progress to your normal diet.  A half-sandwich or bowl of soup is an example of a good first meal.  Heavy or fried foods are harder to digest and may make you feel nauseous or bloated.  Likewise meals heavy in dairy and vegetables can cause extra gas to form and this can also increase the bloating.  Drink plenty of fluids but you should avoid alcoholic beverages for 24 hours.  ACTIVITY: Your care partner should take you home directly after the procedure.  You should plan to take it easy, moving slowly for the rest of the day.  You can resume normal activity the day after the procedure however you should NOT DRIVE or use heavy machinery for 24 hours (because of the sedation medicines used during the test).    SYMPTOMS TO REPORT IMMEDIATELY: A gastroenterologist can be reached at any hour.  During normal business hours, 8:30 AM to 5:00 PM Monday through Friday,  call [phone].  After hours and on weekends, please call the GI answering service at [phone] who will take a message and have the physician on call contact you.   Following lower endoscopy (colonoscopy or flexible sigmoidoscopy):  Excessive amounts of blood in the stool  Significant tenderness or worsening of abdominal pains  Swelling of the abdomen that is new, acute  Fever of 100F or higher  Following upper endoscopy (EGD)  Vomiting of blood or coffee ground material  New chest pain or pain under the shoulder blades  Painful or persistently difficult swallowing  New shortness of breath  Fever of 100F or higher  Black, tarry-looking stools  FOLLOW UP: If any biopsies were taken you will be contacted by phone or by letter within the next 1-3 weeks.  Call your gastroenterologist if you have not heard about the biopsies in 3 weeks.  Our staff will call the home number listed on your records the next business day following your procedure to check on you and address any questions or concerns that you may have at that time regarding the information given to you following your procedure. This is a courtesy call and so if there is no answer at the home number and we have not heard from you through the emergency physician on call, we will assume that you have returned to your regular daily activities without incident.  SIGNATURES/CONFIDENTIALITY: You and/or your care   partner have signed paperwork which will be entered into your electronic medical record.  These signatures attest to the fact that that the information above on your After Visit Summary has been reviewed and is understood.  Full responsibility of the confidentiality of this discharge information lies with you and/or your care-partner.  

## 2011-12-20 ENCOUNTER — Telehealth: Payer: Self-pay | Admitting: *Deleted

## 2011-12-20 NOTE — Telephone Encounter (Signed)
  Follow up Call-  Call back number 12/19/2011  Post procedure Call Back phone  # [phone]  Permission to leave phone message Yes     Patient questions:  Do you have a fever, pain , or abdominal swelling? no Pain Score  0 *  Have you tolerated food without any problems? yes  Have you been able to return to your normal activities? yes  Do you have any questions about your discharge instructions: Diet   no Medications  no Follow up visit  no  Do you have questions or concerns about your Care? no  Actions: * If pain score is 4 or above: No action needed, pain <4.

## 2012-03-12 ENCOUNTER — Other Ambulatory Visit (HOSPITAL_BASED_OUTPATIENT_CLINIC_OR_DEPARTMENT_OTHER): Payer: Medicare Other | Admitting: Lab

## 2012-03-12 DIAGNOSIS — C8307 Small cell B-cell lymphoma, spleen: Secondary | ICD-10-CM

## 2012-03-12 LAB — CBC WITH DIFFERENTIAL/PLATELET
EOS%: 1.5 % (ref 0.0–7.0)
Eosinophils Absolute: 0.2 10*3/uL (ref 0.0–0.5)
MCV: 80.7 fL (ref 79.3–98.0)
MONO%: 3.4 % (ref 0.0–14.0)
NEUT#: 1.8 10*3/uL (ref 1.5–6.5)
RBC: 4.69 10*6/uL (ref 4.20–5.82)
RDW: 16.1 % — ABNORMAL HIGH (ref 11.0–14.6)
WBC: 10.2 10*3/uL (ref 4.0–10.3)

## 2012-03-12 LAB — COMPREHENSIVE METABOLIC PANEL
AST: 27 U/L (ref 0–37)
Albumin: 4.6 g/dL (ref 3.5–5.2)
Alkaline Phosphatase: 69 U/L (ref 39–117)
Glucose, Bld: 106 mg/dL — ABNORMAL HIGH (ref 70–99)
Potassium: 3.9 mEq/L (ref 3.5–5.3)
Sodium: 141 mEq/L (ref 135–145)
Total Protein: 6.4 g/dL (ref 6.0–8.3)

## 2012-03-13 ENCOUNTER — Telehealth: Payer: Self-pay | Admitting: *Deleted

## 2012-03-13 NOTE — Telephone Encounter (Signed)
Called pt w/ results of CBC and relayed Dr. Lodema Pilot message.  Instructed pt to keep next appt in Sept as scheduled. He verbalized understanding.

## 2012-03-13 NOTE — Telephone Encounter (Signed)
Message copied by Wende Mott on Wed Mar 13, 2012  4:53 PM ------      Message from: HA, Raliegh Ip T      Created: Wed Mar 13, 2012  7:50 AM       Please contact pt.  His CBC is relatively normal (from history of indolent nonHodgkin's lymphoma).  Again, there is no indication for chemo at this time.  Continue observation.  Thanks.

## 2012-05-07 ENCOUNTER — Ambulatory Visit (HOSPITAL_BASED_OUTPATIENT_CLINIC_OR_DEPARTMENT_OTHER): Payer: Medicare Other | Admitting: Oncology

## 2012-05-07 ENCOUNTER — Encounter: Payer: Self-pay | Admitting: Oncology

## 2012-05-07 ENCOUNTER — Other Ambulatory Visit (HOSPITAL_BASED_OUTPATIENT_CLINIC_OR_DEPARTMENT_OTHER): Payer: Medicare Other | Admitting: Lab

## 2012-05-07 ENCOUNTER — Telehealth: Payer: Self-pay | Admitting: Oncology

## 2012-05-07 VITALS — BP 118/70 | HR 55 | Temp 97.1°F | Resp 20 | Ht 71.0 in | Wt 219.6 lb

## 2012-05-07 DIAGNOSIS — C8307 Small cell B-cell lymphoma, spleen: Secondary | ICD-10-CM

## 2012-05-07 DIAGNOSIS — M549 Dorsalgia, unspecified: Secondary | ICD-10-CM

## 2012-05-07 DIAGNOSIS — Z23 Encounter for immunization: Secondary | ICD-10-CM

## 2012-05-07 LAB — COMPREHENSIVE METABOLIC PANEL (CC13)
ALT: 15 U/L (ref 0–55)
AST: 26 U/L (ref 5–34)
Creatinine: 1.3 mg/dL (ref 0.7–1.3)
Total Bilirubin: 0.4 mg/dL (ref 0.20–1.20)

## 2012-05-07 LAB — CBC WITH DIFFERENTIAL/PLATELET
BASO%: 0.5 % (ref 0.0–2.0)
EOS%: 1.1 % (ref 0.0–7.0)
HCT: 37.8 % — ABNORMAL LOW (ref 38.4–49.9)
LYMPH%: 79.1 % — ABNORMAL HIGH (ref 14.0–49.0)
MCH: 26.9 pg — ABNORMAL LOW (ref 27.2–33.4)
MCHC: 32.8 g/dL (ref 32.0–36.0)
MCV: 82.1 fL (ref 79.3–98.0)
NEUT%: 15.6 % — ABNORMAL LOW (ref 39.0–75.0)
Platelets: 194 10*3/uL (ref 140–400)

## 2012-05-07 LAB — LACTATE DEHYDROGENASE (CC13): LDH: 126 U/L (ref 125–220)

## 2012-05-07 MED ORDER — INFLUENZA VIRUS VACC SPLIT PF IM SUSP
0.5000 mL | Freq: Once | INTRAMUSCULAR | Status: AC
  Administered 2012-05-07: 0.5 mL via INTRAMUSCULAR
  Filled 2012-05-07: qty 0.5

## 2012-05-07 NOTE — Telephone Encounter (Signed)
appts made and printed for pt aom °

## 2012-05-07 NOTE — Progress Notes (Signed)
[facility]  Telephone:(336) [phone] Fax:(336) [phone]   OFFICE PROGRESS NOTE   Cc:  FRIED, ROBERT L, MD  DIAGNOSIS:  splenic marginal zone B-cell lymphoma  CURRENT THERAPY:  Watchful observation.   INTERVAL HISTORY: Marcus Taylor 71 y.o. male returns for regular follow up with his wife for routine follow-up. Lower back pain has resolved at this time. He denies anorexia, weight loss, SOB, chest pain, palpitation, early satiety, palpable node swelling. 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   Past Medical History  Diagnosis Date  . Lymphocytosis   . Diabetes mellitus   . HTN (hypertension)   . Hypertriglyceridemia   . Metabolic syndrome   . Diverticulosis   . Kidney stone   . OSA (obstructive sleep apnea)   . Colonic polyp   . Splenic marginal zone b-cell lymphoma 12/18/2011    Past Surgical History  Procedure Date  . Lumbar disc surgery   . Hernia repair   . Cholecystectomy     Current Outpatient Prescriptions  Medication Sig Dispense Refill  . aspirin 325 MG tablet Take 325 mg by mouth daily.      Marland Kitchen gemfibrozil (LOPID) 600 MG tablet Take by mouth Twice daily.      . meloxicam (MOBIC) 15 MG tablet Take by mouth Daily.      . quinapril-hydrochlorothiazide (ACCURETIC) 20-25 MG per tablet Take by mouth Daily.       Current Facility-Administered Medications  Medication Dose Route Frequency Provider Last Rate Last Dose  . influenza  inactive virus vaccine (FLUZONE/FLUARIX) injection 0.5 mL  0.5 mL Intramuscular Once Myrtis Ser, NP   0.5 mL at 05/07/12 1358    ALLERGIES:   has no known allergies.  REVIEW OF SYSTEMS:  The rest of the 14-point review of system was negative.   Filed Vitals:   05/07/12 1319  BP: 118/70  Pulse: 55  Temp: 97.1 F (36.2 C)  Resp: 20   Wt Readings from Last 3 Encounters:  05/07/12 219 lb 9.6 oz (99.61 kg)  12/19/11 225 lb (102.059 kg)  12/18/11 223 lb 14.4 oz (101.56 kg)   ECOG  Performance status: 1  PHYSICAL EXAMINATION:   General: Mildly obese man in no acute distress. Eyes: no scleral icterus. ENT: There were no oropharyngeal lesions. Neck was without thyromegaly. Lymphatics: Negative cervical, supraclavicular or axillary adenopathy. Respiratory: lungs were clear bilaterally without wheezing or crackles. Cardiovascular: Regular rate and rhythm, S1/S2, without murmur, rub or gallop. There was no pedal edema. GI: abdomen was soft, flat, nontender, nondistended.  Due to his body habitus, I could not appreciate his splenomegaly.  Muscoloskeletal: no spinal tenderness of palpation of vertebral spine. Skin exam was without echymosis, petichae. Neuro exam was nonfocal. Patient was able to get on and off exam table without assistance. Gait was normal. Patient was alerted and oriented. Attention was good. Language was appropriate. Mood was normal without depression. Speech was not pressured. Thought content was not tangential.   LABORATORY/RADIOLOGY DATA:  Lab Results  Component Value Date   WBC 13.0* 05/07/2012   HGB 12.4* 05/07/2012   HCT 37.8* 05/07/2012   PLT 194 05/07/2012   GLUCOSE 105* 05/07/2012   ALKPHOS 90 05/07/2012   ALT 15 05/07/2012   AST 26 05/07/2012   NA 141 05/07/2012   K 3.6 05/07/2012   CL 102 05/07/2012   CREATININE 1.3 05/07/2012   BUN 25.0 05/07/2012   CO2 29 05/07/2012   ASSESSMENT  AND PLAN:    1. Hypertension: Well controlled on Accuretic per PCP.   2. DM: Diet controlled per PCP.   3. Hypertriglyceridemia: On Lopid per PCP.   4. Diverticulosis: stable. No history of diverticulitis.   5. Lymphocyte-predominant leukocytosis and splenomegaly:  Newly diagnosed splenic marginal zone B-cell lymphoma (an indolent subtype of Non-Hodgkin's lymphoma). - Since chemo therapy does not cure these type of slow growing lymphoma, not all patients requires treatment right away.  - Indications for treatment:  Severe constitutional symptoms, low blood count,  recurrent infection, very bulky disease causing LUQ abdominal pain or early satiety.  - Recommendation:  He does not have any of these indication.  Thus,  I recommended observation at this point.  [name] and his wife expressed informed understanding and wished to proceed with observation as well.   6.  Chronic lower back pain:  Most likely due to OA.  He had lumbar surgery in the past.  Pain has resolved.  7. Health maintenance: Flu vaccine was administered today. Colonoscopy was performed earlier in 2013.He has a colonoscopy every 5 years due to a sister with a history of colon cancer.  8.  Follow up: - Lab only appointment in about 2-3 months. - Visit in about 5-6 months.     The length of time of the face-to-face encounter was 15 minutes. More than 50% of time was spent counseling and coordination of care.

## 2012-05-07 NOTE — Patient Instructions (Addendum)
1. Diagnosis: Splenic marginal zone B-cell lymphoma (an indolent subtype of Non-Hodgkin's lymphoma).  - Since chemo therapy does not cure these type of slow growing lymphoma, not all patients require treatment right away.  - Indications for treatment: Severe constitutional symptoms, low blood count, recurrent infection, very bulky disease.  - Recommendation: Observation at this point.   2. Follow up:  - Lab only appointment in about 2-3 months.  - Visit in about 5-6 months.

## 2012-07-15 ENCOUNTER — Other Ambulatory Visit (HOSPITAL_BASED_OUTPATIENT_CLINIC_OR_DEPARTMENT_OTHER): Payer: Medicare Other | Admitting: Lab

## 2012-07-15 DIAGNOSIS — C8307 Small cell B-cell lymphoma, spleen: Secondary | ICD-10-CM

## 2012-07-15 LAB — CBC WITH DIFFERENTIAL/PLATELET
BASO%: 0.2 % (ref 0.0–2.0)
Eosinophils Absolute: 0.1 10*3/uL (ref 0.0–0.5)
LYMPH%: 79.4 % — ABNORMAL HIGH (ref 14.0–49.0)
MCHC: 32.8 g/dL (ref 32.0–36.0)
MONO#: 0.4 10*3/uL (ref 0.1–0.9)
NEUT#: 2.1 10*3/uL (ref 1.5–6.5)
Platelets: 201 10*3/uL (ref 140–400)
RBC: 4.83 10*6/uL (ref 4.20–5.82)
RDW: 15.9 % — ABNORMAL HIGH (ref 11.0–14.6)
WBC: 12.9 10*3/uL — ABNORMAL HIGH (ref 4.0–10.3)
lymph#: 10.3 10*3/uL — ABNORMAL HIGH (ref 0.9–3.3)

## 2012-07-15 LAB — COMPREHENSIVE METABOLIC PANEL (CC13)
ALT: 17 U/L (ref 0–55)
Albumin: 4 g/dL (ref 3.5–5.0)
CO2: 29 mEq/L (ref 22–29)
Chloride: 100 mEq/L (ref 98–107)
Glucose: 187 mg/dl — ABNORMAL HIGH (ref 70–99)
Potassium: 3.7 mEq/L (ref 3.5–5.1)
Sodium: 142 mEq/L (ref 136–145)
Total Bilirubin: 0.36 mg/dL (ref 0.20–1.20)
Total Protein: 6.6 g/dL (ref 6.4–8.3)

## 2012-07-15 LAB — LACTATE DEHYDROGENASE (CC13): LDH: 119 U/L — ABNORMAL LOW (ref 125–245)

## 2012-07-15 LAB — TECHNOLOGIST REVIEW

## 2012-10-07 ENCOUNTER — Telehealth: Payer: Self-pay | Admitting: Oncology

## 2012-10-07 ENCOUNTER — Ambulatory Visit (HOSPITAL_BASED_OUTPATIENT_CLINIC_OR_DEPARTMENT_OTHER): Payer: Medicare Other | Admitting: Oncology

## 2012-10-07 ENCOUNTER — Other Ambulatory Visit (HOSPITAL_BASED_OUTPATIENT_CLINIC_OR_DEPARTMENT_OTHER): Payer: Medicare Other | Admitting: Lab

## 2012-10-07 VITALS — BP 118/71 | HR 61 | Temp 96.8°F | Resp 20 | Wt 219.0 lb

## 2012-10-07 DIAGNOSIS — I10 Essential (primary) hypertension: Secondary | ICD-10-CM

## 2012-10-07 DIAGNOSIS — C8307 Small cell B-cell lymphoma, spleen: Secondary | ICD-10-CM

## 2012-10-07 DIAGNOSIS — M159 Polyosteoarthritis, unspecified: Secondary | ICD-10-CM

## 2012-10-07 LAB — COMPREHENSIVE METABOLIC PANEL (CC13)
ALT: 17 U/L (ref 0–55)
AST: 27 U/L (ref 5–34)
Alkaline Phosphatase: 77 U/L (ref 40–150)
BUN: 22 mg/dL (ref 7.0–26.0)
Creatinine: 1.3 mg/dL (ref 0.7–1.3)
Potassium: 3.5 mEq/L (ref 3.5–5.1)

## 2012-10-07 LAB — TECHNOLOGIST REVIEW

## 2012-10-07 LAB — CBC WITH DIFFERENTIAL/PLATELET
BASO%: 0.6 % (ref 0.0–2.0)
Basophils Absolute: 0.1 10*3/uL (ref 0.0–0.1)
EOS%: 1.5 % (ref 0.0–7.0)
HGB: 12.3 g/dL — ABNORMAL LOW (ref 13.0–17.1)
MCH: 26.7 pg — ABNORMAL LOW (ref 27.2–33.4)
MCHC: 33.7 g/dL (ref 32.0–36.0)
MCV: 79.3 fL (ref 79.3–98.0)
MONO%: 3.3 % (ref 0.0–14.0)
NEUT%: 16.3 % — ABNORMAL LOW (ref 39.0–75.0)
RDW: 15.7 % — ABNORMAL HIGH (ref 11.0–14.6)

## 2012-10-07 NOTE — Telephone Encounter (Signed)
Gave pt appt for lab and MD on August 2014 °

## 2012-10-07 NOTE — Patient Instructions (Addendum)
1.  Diagnosis:  Splenic marginal B-cell lymphoma:  An indolent B-cell Non Hodgkin's lymphoma. 2.  Treatment:  Observation.  No chemo is indicated since these types of cancer are responsive to chemo; however, remission is not long lasting.  Chemotherapy is recommended if:  Severe constitutional symptoms, recurrent infection, severe anemia/low platelet count, severely enlarged lymph nodes.  3.  Follow up:  In about 6 months.

## 2012-10-08 NOTE — Progress Notes (Signed)
[facility]  Telephone:(336) [phone] Fax:(336) [phone]   OFFICE PROGRESS NOTE   Cc:  FRIED, ROBERT L, MD  DIAGNOSIS:  splenic marginal zone B-cell lymphoma  CURRENT THERAPY:  Watchful observation.   INTERVAL HISTORY: Marcus Taylor 72 y.o. male returns for regular follow up with his wife.  He reported chronic arthritic pain in the hands, knees, hips, ankles.  The pain is mild, crampy/stiff, worst in the morning, improves with activities.  He takes intermittent Ultram.  He denied fever, weight loss, anorexia, palpable node swellings, recurrent infection, fever, night sweat, skin rash.  The rest of the 14-point review of system was negative.   Past Medical History  Diagnosis Date  . Lymphocytosis   . Diabetes mellitus   . HTN (hypertension)   . Hypertriglyceridemia   . Metabolic syndrome   . Diverticulosis   . Kidney stone   . OSA (obstructive sleep apnea)   . Colonic polyp   . Splenic marginal zone b-cell lymphoma 12/18/2011    Past Surgical History  Procedure Laterality Date  . Lumbar disc surgery    . Hernia repair    . Cholecystectomy      Current Outpatient Prescriptions  Medication Sig Dispense Refill  . aspirin 325 MG tablet Take 325 mg by mouth daily.      Marland Kitchen gemfibrozil (LOPID) 600 MG tablet Take by mouth Twice daily.      . meloxicam (MOBIC) 15 MG tablet Take by mouth Daily.      . quinapril-hydrochlorothiazide (ACCURETIC) 20-25 MG per tablet Take by mouth Daily.      . traMADol (ULTRAM) 50 MG tablet Take one tablet as needed       No current facility-administered medications for this visit.    ALLERGIES:  has No Known Allergies.   Filed Vitals:   10/07/12 1026  BP: 118/71  Pulse: 61  Temp: 96.8 F (36 C)  Resp: 20   Wt Readings from Last 3 Encounters:  10/07/12 219 lb (99.338 kg)  05/07/12 219 lb 9.6 oz (99.61 kg)  12/19/11 225 lb (102.059 kg)   ECOG Performance status: 0-1  PHYSICAL EXAMINATION:    General: Mildly obese  man in no acute distress. Eyes: no scleral icterus. ENT: There were no oropharyngeal lesions. Neck was without thyromegaly. Lymphatics: Negative cervical, supraclavicular or axillary adenopathy. Respiratory: lungs were clear bilaterally without wheezing or crackles. Cardiovascular: Regular rate and rhythm, S1/S2, without murmur, rub or gallop. There was no pedal edema. GI: abdomen was soft, flat, nontender, non-distended.   There was about 5cm splenomegaly below costal margin on today exam. Muscoloskeletal: no spinal tenderness of palpation of vertebral spine. Skin exam was without echymosis, petichae. Neuro exam was nonfocal. Patient was able to get on and off exam table without assistance. Gait was normal. Patient was alerted and oriented. Attention was good. Language was appropriate. Mood was normal without depression. Speech was not pressured. Thought content was not tangential.        LABORATORY/RADIOLOGY DATA:  Lab Results  Component Value Date   WBC 11.9* 10/07/2012   HGB 12.3* 10/07/2012   HCT 36.6* 10/07/2012   PLT 206 10/07/2012   GLUCOSE 199* 10/07/2012   ALKPHOS 77 10/07/2012   ALT 17 10/07/2012   AST 27 10/07/2012   NA 143 10/07/2012   K 3.5 10/07/2012   CL 102 10/07/2012   CREATININE 1.3 10/07/2012   BUN 22.0 10/07/2012   CO2 31* 10/07/2012     ASSESSMENT AND PLAN:  1. Hypertension: on Accuretic per PCP.   2. DM: Diet controlled per PCP.   3. Hypertriglyceridemia: On Lopid per PCP.   4. Osteoarthritis:  On Ultram prn.   5.  Splenic marginal B-cell lymphoma:  An indolent B-cell Non Hodgkin's lymphoma. - Treatment:  Observation.  No chemo is indicated since these types of cancer are responsive to chemo; however, remission is not long lasting.   - Chemotherapy is recommended if:  Severe constitutional symptoms, recurrent infection, severe anemia/low platelet count, severely enlarged lymph nodes. - [name] and his wife expressed informed understanding and agreed with  watchful observation.   7.  Follow up:  In about 6 months.  At that time, if he does well, we can space out visit once a year.     The length of time of the face-to-face encounter was 10 minutes. More than 50% of time was spent counseling and coordination of care.

## 2013-03-19 ENCOUNTER — Other Ambulatory Visit: Payer: Self-pay

## 2013-04-07 ENCOUNTER — Encounter: Payer: Self-pay | Admitting: Oncology

## 2013-04-07 ENCOUNTER — Other Ambulatory Visit (HOSPITAL_BASED_OUTPATIENT_CLINIC_OR_DEPARTMENT_OTHER): Payer: Medicare Other | Admitting: Lab

## 2013-04-07 ENCOUNTER — Ambulatory Visit (HOSPITAL_BASED_OUTPATIENT_CLINIC_OR_DEPARTMENT_OTHER): Payer: Medicare Other | Admitting: Oncology

## 2013-04-07 ENCOUNTER — Telehealth: Payer: Self-pay | Admitting: Oncology

## 2013-04-07 VITALS — BP 104/62 | HR 54 | Temp 96.8°F | Resp 18 | Ht 71.0 in | Wt 218.2 lb

## 2013-04-07 DIAGNOSIS — M159 Polyosteoarthritis, unspecified: Secondary | ICD-10-CM

## 2013-04-07 DIAGNOSIS — C8307 Small cell B-cell lymphoma, spleen: Secondary | ICD-10-CM

## 2013-04-07 LAB — COMPREHENSIVE METABOLIC PANEL (CC13)
ALT: 18 U/L (ref 0–55)
AST: 25 U/L (ref 5–34)
CO2: 26 mEq/L (ref 22–29)
Calcium: 9.3 mg/dL (ref 8.4–10.4)
Chloride: 103 mEq/L (ref 98–109)
Sodium: 142 mEq/L (ref 136–145)
Total Protein: 6.8 g/dL (ref 6.4–8.3)

## 2013-04-07 LAB — LACTATE DEHYDROGENASE (CC13): LDH: 142 U/L (ref 125–245)

## 2013-04-07 LAB — CBC WITH DIFFERENTIAL/PLATELET
BASO%: 0.4 % (ref 0.0–2.0)
MCHC: 32.5 g/dL (ref 32.0–36.0)
MONO#: 0.5 10*3/uL (ref 0.1–0.9)
RBC: 4.61 10*6/uL (ref 4.20–5.82)
RDW: 17.3 % — ABNORMAL HIGH (ref 11.0–14.6)
WBC: 14.4 10*3/uL — ABNORMAL HIGH (ref 4.0–10.3)
lymph#: 11.6 10*3/uL — ABNORMAL HIGH (ref 0.9–3.3)

## 2013-04-07 LAB — TECHNOLOGIST REVIEW

## 2013-04-07 NOTE — Telephone Encounter (Signed)
gv and printed appt sched and avs for pt for NOV and Feb 2015

## 2013-04-07 NOTE — Progress Notes (Signed)
[facility]  Telephone:(336) [phone] Fax:(336) [phone]   OFFICE PROGRESS NOTE   Cc:  FRIED, ROBERT L, MD  DIAGNOSIS:  splenic marginal zone B-cell lymphoma  CURRENT THERAPY:  Watchful observation.   INTERVAL HISTORY: Marcus Taylor 72 y.o. male returns for regular follow up with his wife.  He reported chronic arthritic pain in the hands, knees, hips, ankles.  The pain is mild, crampy/stiff, worst in the morning, improves with activities.  He takes intermittent Ultram.  He denied fever, weight loss, anorexia, palpable node swellings, recurrent infection, fever, night sweats, skin rash.  The rest of the 14-point review of system was negative.   Past Medical History  Diagnosis Date  . Lymphocytosis   . Diabetes mellitus   . HTN (hypertension)   . Hypertriglyceridemia   . Metabolic syndrome   . Diverticulosis   . Kidney stone   . OSA (obstructive sleep apnea)   . Colonic polyp   . Splenic marginal zone b-cell lymphoma 12/18/2011    Past Surgical History  Procedure Laterality Date  . Lumbar disc surgery    . Hernia repair    . Cholecystectomy      Current Outpatient Prescriptions  Medication Sig Dispense Refill  . aspirin 325 MG tablet Take 325 mg by mouth daily.      Marland Kitchen gemfibrozil (LOPID) 600 MG tablet Take by mouth Twice daily.      . quinapril-hydrochlorothiazide (ACCURETIC) 20-25 MG per tablet Take by mouth Daily.      . traMADol (ULTRAM) 50 MG tablet Take one tablet as needed       No current facility-administered medications for this visit.    ALLERGIES:  has No Known Allergies.   Filed Vitals:   04/07/13 1049  BP: 104/62  Pulse: 54  Temp: 96.8 F (36 C)  Resp: 18   Wt Readings from Last 3 Encounters:  04/07/13 218 lb 3.2 oz (98.975 kg)  10/07/12 219 lb (99.338 kg)  05/07/12 219 lb 9.6 oz (99.61 kg)   ECOG Performance status: 0-1  PHYSICAL EXAMINATION:    General: Mildly obese man in no acute distress. Eyes: no scleral icterus.  ENT: There were no oropharyngeal lesions. Neck was without thyromegaly. Lymphatics: Negative cervical, supraclavicular or axillary adenopathy. Respiratory: lungs were clear bilaterally without wheezing or crackles. Cardiovascular: Regular rate and rhythm, S1/S2, without murmur, rub or gallop. There was no pedal edema. GI: abdomen was soft, flat, nontender, non-distended.   There was about 5cm splenomegaly below costal margin on today exam. Muscoloskeletal: no spinal tenderness of palpation of vertebral spine. Skin exam was without echymosis, petichae. Neuro exam was nonfocal. Patient was able to get on and off exam table without assistance. Gait was normal. Patient was alerted and oriented. Attention was good. Language was appropriate. Mood was normal without depression. Speech was not pressured. Thought content was not tangential.     LABORATORY/RADIOLOGY DATA:  Lab Results  Component Value Date   WBC 14.4* 04/07/2013   HGB 11.7* 04/07/2013   HCT 36.2* 04/07/2013   PLT 218 04/07/2013   GLUCOSE 164* 04/07/2013   ALKPHOS 78 04/07/2013   ALT 18 04/07/2013   AST 25 04/07/2013   NA 142 04/07/2013   K 3.7 04/07/2013   CL 102 10/07/2012   CREATININE 1.2 04/07/2013   BUN 24.7 04/07/2013   CO2 26 04/07/2013     ASSESSMENT AND PLAN:    1. Hypertension: on Accuretic per PCP.   2. DM: Diet controlled per PCP.  3. Hypertriglyceridemia: On Lopid per PCP.   4. Osteoarthritis:  On Ultram prn.   5.  Splenic marginal B-cell lymphoma:  An indolent B-cell Non Hodgkin's lymphoma. - Treatment:  Observation.  No chemo is indicated since these types of cancer are responsive to chemo; however, remission is not long lasting.   - Chemotherapy is recommended if:  Severe constitutional symptoms, recurrent infection, severe anemia/low platelet count, severely enlarged lymph nodes. - [name] and his wife expressed informed understanding and agreed with watchful observation.   7.  Follow up:  In about 6 months.   Lab only in 3 months.     The length of time of the face-to-face encounter was 15 minutes. More than 50% of time was spent counseling and coordination of care.

## 2013-06-19 ENCOUNTER — Other Ambulatory Visit: Payer: Self-pay

## 2013-06-30 ENCOUNTER — Other Ambulatory Visit (HOSPITAL_BASED_OUTPATIENT_CLINIC_OR_DEPARTMENT_OTHER): Payer: Medicare Other

## 2013-06-30 DIAGNOSIS — C8307 Small cell B-cell lymphoma, spleen: Secondary | ICD-10-CM

## 2013-06-30 LAB — COMPREHENSIVE METABOLIC PANEL (CC13)
AST: 24 U/L (ref 5–34)
Albumin: 4.1 g/dL (ref 3.5–5.0)
Anion Gap: 11 mEq/L (ref 3–11)
BUN: 25 mg/dL (ref 7.0–26.0)
CO2: 26 mEq/L (ref 22–29)
Calcium: 9.7 mg/dL (ref 8.4–10.4)
Chloride: 104 mEq/L (ref 98–109)
Creatinine: 1.3 mg/dL (ref 0.7–1.3)
Glucose: 147 mg/dl — ABNORMAL HIGH (ref 70–140)
Potassium: 3.9 mEq/L (ref 3.5–5.1)

## 2013-06-30 LAB — TECHNOLOGIST REVIEW

## 2013-06-30 LAB — CBC WITH DIFFERENTIAL/PLATELET
Basophils Absolute: 0.1 10*3/uL (ref 0.0–0.1)
EOS%: 1.1 % (ref 0.0–7.0)
Eosinophils Absolute: 0.1 10*3/uL (ref 0.0–0.5)
HCT: 39.4 % (ref 38.4–49.9)
HGB: 12.6 g/dL — ABNORMAL LOW (ref 13.0–17.1)
MCH: 25.6 pg — ABNORMAL LOW (ref 27.2–33.4)
MONO#: 0.5 10*3/uL (ref 0.1–0.9)
NEUT#: 2.2 10*3/uL (ref 1.5–6.5)
NEUT%: 16.9 % — ABNORMAL LOW (ref 39.0–75.0)
RDW: 16.5 % — ABNORMAL HIGH (ref 11.0–14.6)
WBC: 13 10*3/uL — ABNORMAL HIGH (ref 4.0–10.3)
lymph#: 10.1 10*3/uL — ABNORMAL HIGH (ref 0.9–3.3)

## 2013-09-12 ENCOUNTER — Telehealth: Payer: Self-pay | Admitting: *Deleted

## 2013-09-12 NOTE — Telephone Encounter (Signed)
Pt is already aware of his appt change on 09/22/13....td

## 2013-09-22 ENCOUNTER — Ambulatory Visit (HOSPITAL_BASED_OUTPATIENT_CLINIC_OR_DEPARTMENT_OTHER): Payer: Medicare Other | Admitting: Hematology and Oncology

## 2013-09-22 ENCOUNTER — Other Ambulatory Visit (HOSPITAL_BASED_OUTPATIENT_CLINIC_OR_DEPARTMENT_OTHER): Payer: Medicare Other

## 2013-09-22 ENCOUNTER — Encounter: Payer: Self-pay | Admitting: Hematology and Oncology

## 2013-09-22 VITALS — BP 138/64 | HR 62 | Temp 97.6°F | Resp 18 | Ht 71.0 in | Wt 227.5 lb

## 2013-09-22 DIAGNOSIS — D649 Anemia, unspecified: Secondary | ICD-10-CM

## 2013-09-22 DIAGNOSIS — C8307 Small cell B-cell lymphoma, spleen: Secondary | ICD-10-CM

## 2013-09-22 DIAGNOSIS — Z23 Encounter for immunization: Secondary | ICD-10-CM

## 2013-09-22 LAB — CBC WITH DIFFERENTIAL/PLATELET
BASO%: 0.4 % (ref 0.0–2.0)
BASOS ABS: 0.1 10*3/uL (ref 0.0–0.1)
EOS ABS: 0.2 10*3/uL (ref 0.0–0.5)
EOS%: 1.2 % (ref 0.0–7.0)
HCT: 39.2 % (ref 38.4–49.9)
HEMOGLOBIN: 12.6 g/dL — AB (ref 13.0–17.1)
LYMPH%: 79.2 % — ABNORMAL HIGH (ref 14.0–49.0)
MCH: 25.9 pg — ABNORMAL LOW (ref 27.2–33.4)
MCHC: 32.1 g/dL (ref 32.0–36.0)
MCV: 80.5 fL (ref 79.3–98.0)
MONO#: 0.6 10*3/uL (ref 0.1–0.9)
MONO%: 4.1 % (ref 0.0–14.0)
NEUT%: 15.1 % — ABNORMAL LOW (ref 39.0–75.0)
NEUTROS ABS: 2.3 10*3/uL (ref 1.5–6.5)
PLATELETS: 217 10*3/uL (ref 140–400)
RBC: 4.87 10*6/uL (ref 4.20–5.82)
RDW: 16.4 % — AB (ref 11.0–14.6)
WBC: 15.4 10*3/uL — ABNORMAL HIGH (ref 4.0–10.3)
lymph#: 12.2 10*3/uL — ABNORMAL HIGH (ref 0.9–3.3)

## 2013-09-22 MED ORDER — INFLUENZA VAC SPLIT QUAD 0.5 ML IM SUSP
0.5000 mL | Freq: Once | INTRAMUSCULAR | Status: AC
  Administered 2013-09-22: 0.5 mL via INTRAMUSCULAR
  Filled 2013-09-22: qty 0.5

## 2013-09-22 NOTE — Progress Notes (Signed)
[place] OFFICE PROGRESS NOTE  Patient Care Team: Abigail Miyamoto, MD as PCP - General (Family Medicine)  DIAGNOSIS: Splenic marginal zone lymphoma with splenomegaly  SUMMARY OF ONCOLOGIC HISTORY: This is a very pleasant gentleman who was found to have abnormal CBC for the past 5 years. In April 2013, flow cytometry detectable monoclonal B-cell population suspicious for splenic/marginal zone lymphoma. Ultrasound confirmed splenomegaly. Due to lack of symptoms he was being observed.  INTERVAL HISTORY: Marcus Taylor 73 y.o. male returns for further followup. He denies any new lymphadenopathy. His appetite is stable, denies any recent weight loss. Denies history of recurrent infection. The patient denies any mouth sores, nausea, vomiting or change in bowel habits  I have reviewed the past medical history, past surgical history, social history and family history with the patient and they are unchanged from previous note.  ALLERGIES:  has No Known Allergies.  MEDICATIONS:  Current Outpatient Prescriptions  Medication Sig Dispense Refill  . aspirin 325 MG tablet Take 325 mg by mouth daily.      Marland Kitchen gemfibrozil (LOPID) 600 MG tablet Take by mouth Twice daily.      . quinapril-hydrochlorothiazide (ACCURETIC) 20-25 MG per tablet Take by mouth Daily.      . traMADol (ULTRAM) 50 MG tablet Take one tablet as needed       No current facility-administered medications for this visit.    REVIEW OF SYSTEMS:   Constitutional: Denies fevers, chills or abnormal weight loss Eyes: Denies blurriness of vision Ears, nose, mouth, throat, and face: Denies mucositis or sore throat Respiratory: Denies cough, dyspnea or wheezes Cardiovascular: Denies palpitation, chest discomfort or lower extremity swelling Gastrointestinal:  Denies nausea, heartburn or change in bowel habits Skin: Denies abnormal skin rashes Lymphatics: Denies new lymphadenopathy or easy bruising Neurological:Denies  numbness, tingling or new weaknesses Behavioral/Psych: Mood is stable, no new changes  All other systems were reviewed with the patient and are negative.  PHYSICAL EXAMINATION: ECOG PERFORMANCE STATUS: 0 - Asymptomatic  Filed Vitals:   09/22/13 1343  BP: 138/64  Pulse: 62  Temp: 97.6 F (36.4 C)  Resp: 18   Filed Weights   09/22/13 1343  Weight: 227 lb 8 oz (103.193 kg)    GENERAL:alert, no distress and comfortable SKIN: skin color, texture, turgor are normal, no rashes or significant lesions EYES: normal, Conjunctiva are pink and non-injected, sclera clear OROPHARYNX:no exudate, no erythema and lips, buccal mucosa, and tongue normal  NECK: supple, thyroid normal size, non-tender, without nodularity LYMPH:  no palpable lymphadenopathy in the cervical, axillary or inguinal LUNGS: clear to auscultation and percussion with normal breathing effort HEART: regular rate & rhythm and no murmurs and no lower extremity edema ABDOMEN:abdomen soft, non-tender and normal bowel sounds. Due to abdomen obesity, I am not able to appreciate splenomegaly Musculoskeletal:no cyanosis of digits and no clubbing  NEURO: alert & oriented x 3 with fluent speech, no focal motor/sensory deficits  LABORATORY DATA:  I have reviewed the data as listed    Component Value Date/Time   NA 142 06/30/2013 0844   NA 141 03/12/2012 1358   K 3.9 06/30/2013 0844   K 3.9 03/12/2012 1358   CL 102 10/07/2012 1013   CL 105 03/12/2012 1358   CO2 26 06/30/2013 0844   CO2 28 03/12/2012 1358   GLUCOSE 147* 06/30/2013 0844   GLUCOSE 199* 10/07/2012 1013   GLUCOSE 106* 03/12/2012 1358   BUN 25.0 06/30/2013 0844   BUN 24* 03/12/2012 1358  CREATININE 1.3 06/30/2013 0844   CREATININE 1.15 03/12/2012 1358   CALCIUM 9.7 06/30/2013 0844   CALCIUM 9.7 03/12/2012 1358   PROT 7.1 06/30/2013 0844   PROT 6.4 03/12/2012 1358   ALBUMIN 4.1 06/30/2013 0844   ALBUMIN 4.6 03/12/2012 1358   AST 24 06/30/2013 0844   AST 27 03/12/2012 1358    ALT 16 06/30/2013 0844   ALT 15 03/12/2012 1358   ALKPHOS 71 06/30/2013 0844   ALKPHOS 69 03/12/2012 1358   BILITOT 0.34 06/30/2013 0844   BILITOT 0.3 03/12/2012 1358   GFRNONAA >60 01/12/2008 1508   GFRAA  Value: >60        The eGFR has been calculated using the MDRD equation. This calculation has not been validated in all clinical 01/12/2008 1508    No results found for this basename: SPEP, UPEP,  kappa and lambda light chains    Lab Results  Component Value Date   WBC 15.4* 09/22/2013   NEUTROABS 2.3 09/22/2013   HGB 12.6* 09/22/2013   HCT 39.2 09/22/2013   MCV 80.5 09/22/2013   PLT 217 09/22/2013      Chemistry      Component Value Date/Time   NA 142 06/30/2013 0844   NA 141 03/12/2012 1358   K 3.9 06/30/2013 0844   K 3.9 03/12/2012 1358   CL 102 10/07/2012 1013   CL 105 03/12/2012 1358   CO2 26 06/30/2013 0844   CO2 28 03/12/2012 1358   BUN 25.0 06/30/2013 0844   BUN 24* 03/12/2012 1358   CREATININE 1.3 06/30/2013 0844   CREATININE 1.15 03/12/2012 1358      Component Value Date/Time   CALCIUM 9.7 06/30/2013 0844   CALCIUM 9.7 03/12/2012 1358   ALKPHOS 71 06/30/2013 0844   ALKPHOS 69 03/12/2012 1358   AST 24 06/30/2013 0844   AST 27 03/12/2012 1358   ALT 16 06/30/2013 0844   ALT 15 03/12/2012 1358   BILITOT 0.34 06/30/2013 0844   BILITOT 0.3 03/12/2012 1358      ASSESSMENT & PLAN:  #1 splenic marginal cell lymphoma He has no evidence of disease progression. I will see him in 8 months. If his condition remained stable then, I would just see him on a yearly basis. The patient is made aware signs and symptoms to watch out for possible disease progression #2 anemia This is likely anemia of chronic disease. He has no signs of bleeding and he is not symptomatic. We will observe. #3 preventive care We discussed the importance of preventive care and reviewed the vaccination programs. He does not have any prior allergic reactions to influenza vaccination. He agrees to proceed with  influenza vaccination today and we will administer it today at the clinic.   Orders Placed This Encounter  Procedures  . CBC with Differential    Standing Status: Future     Number of Occurrences:      Standing Expiration Date: 09/22/2014  . Comprehensive metabolic panel    Standing Status: Future     Number of Occurrences:      Standing Expiration Date: 09/22/2014  . Lactate dehydrogenase    Standing Status: Future     Number of Occurrences:      Standing Expiration Date: 09/22/2014   All questions were answered. The patient knows to call the clinic with any problems, questions or concerns. No barriers to learning was detected. I spent 15 minutes counseling the patient face to face. The total time spent in the appointment was  20 minutes and more than 50% was on counseling and review of test results     [facility] Addison Whidbee, MD 09/22/2013 2:08 PM

## 2013-09-23 ENCOUNTER — Telehealth: Payer: Self-pay | Admitting: Hematology and Oncology

## 2013-09-23 NOTE — Telephone Encounter (Signed)
s.w. pt and advised on OCT appt....pt ok and aware °

## 2014-04-30 ENCOUNTER — Inpatient Hospital Stay (HOSPITAL_COMMUNITY): Admission: RE | Admit: 2014-04-30 | Payer: Medicare Other | Source: Ambulatory Visit

## 2014-05-04 ENCOUNTER — Ambulatory Visit (HOSPITAL_COMMUNITY): Payer: Medicare Other

## 2014-05-04 ENCOUNTER — Ambulatory Visit: Payer: Medicare Other | Admitting: Internal Medicine

## 2014-05-06 ENCOUNTER — Ambulatory Visit (HOSPITAL_COMMUNITY): Payer: Medicare Other

## 2014-05-08 ENCOUNTER — Ambulatory Visit (HOSPITAL_COMMUNITY): Payer: Medicare Other

## 2014-05-11 ENCOUNTER — Ambulatory Visit (HOSPITAL_COMMUNITY): Payer: Medicare Other

## 2014-05-13 ENCOUNTER — Ambulatory Visit (HOSPITAL_COMMUNITY): Payer: Medicare Other

## 2014-05-15 ENCOUNTER — Ambulatory Visit (HOSPITAL_COMMUNITY): Payer: Medicare Other

## 2014-05-18 ENCOUNTER — Ambulatory Visit (HOSPITAL_COMMUNITY): Payer: Medicare Other

## 2014-05-20 ENCOUNTER — Ambulatory Visit (HOSPITAL_COMMUNITY): Payer: Medicare Other

## 2014-05-22 ENCOUNTER — Ambulatory Visit (HOSPITAL_COMMUNITY): Payer: Medicare Other

## 2014-05-25 ENCOUNTER — Other Ambulatory Visit (HOSPITAL_BASED_OUTPATIENT_CLINIC_OR_DEPARTMENT_OTHER): Payer: Medicare Other

## 2014-05-25 ENCOUNTER — Telehealth: Payer: Self-pay | Admitting: Hematology and Oncology

## 2014-05-25 ENCOUNTER — Ambulatory Visit (HOSPITAL_BASED_OUTPATIENT_CLINIC_OR_DEPARTMENT_OTHER): Payer: Medicare Other | Admitting: Hematology and Oncology

## 2014-05-25 ENCOUNTER — Encounter: Payer: Self-pay | Admitting: Hematology and Oncology

## 2014-05-25 ENCOUNTER — Ambulatory Visit (HOSPITAL_COMMUNITY): Payer: Medicare Other

## 2014-05-25 VITALS — BP 133/53 | HR 18 | Temp 98.8°F | Resp 18 | Ht 71.0 in | Wt 215.5 lb

## 2014-05-25 DIAGNOSIS — D630 Anemia in neoplastic disease: Secondary | ICD-10-CM | POA: Insufficient documentation

## 2014-05-25 DIAGNOSIS — Z23 Encounter for immunization: Secondary | ICD-10-CM

## 2014-05-25 DIAGNOSIS — C8307 Small cell B-cell lymphoma, spleen: Secondary | ICD-10-CM

## 2014-05-25 DIAGNOSIS — Z299 Encounter for prophylactic measures, unspecified: Secondary | ICD-10-CM

## 2014-05-25 LAB — CBC WITH DIFFERENTIAL/PLATELET
BASO%: 0.3 % (ref 0.0–2.0)
Basophils Absolute: 0 10*3/uL (ref 0.0–0.1)
EOS%: 0.8 % (ref 0.0–7.0)
Eosinophils Absolute: 0.1 10*3/uL (ref 0.0–0.5)
HCT: 38.1 % — ABNORMAL LOW (ref 38.4–49.9)
HGB: 12 g/dL — ABNORMAL LOW (ref 13.0–17.1)
LYMPH%: 72.2 % — ABNORMAL HIGH (ref 14.0–49.0)
MCH: 25.4 pg — AB (ref 27.2–33.4)
MCHC: 31.5 g/dL — AB (ref 32.0–36.0)
MCV: 80.5 fL (ref 79.3–98.0)
MONO#: 0.4 10*3/uL (ref 0.1–0.9)
MONO%: 3.5 % (ref 0.0–14.0)
NEUT#: 2.8 10*3/uL (ref 1.5–6.5)
NEUT%: 23.2 % — ABNORMAL LOW (ref 39.0–75.0)
Platelets: 160 10*3/uL (ref 140–400)
RBC: 4.73 10*6/uL (ref 4.20–5.82)
RDW: 16.6 % — ABNORMAL HIGH (ref 11.0–14.6)
WBC: 11.9 10*3/uL — ABNORMAL HIGH (ref 4.0–10.3)
lymph#: 8.6 10*3/uL — ABNORMAL HIGH (ref 0.9–3.3)

## 2014-05-25 LAB — COMPREHENSIVE METABOLIC PANEL (CC13)
ALBUMIN: 4 g/dL (ref 3.5–5.0)
ALK PHOS: 93 U/L (ref 40–150)
ALT: 27 U/L (ref 0–55)
AST: 31 U/L (ref 5–34)
Anion Gap: 9 mEq/L (ref 3–11)
BUN: 18.6 mg/dL (ref 7.0–26.0)
CO2: 23 mEq/L (ref 22–29)
Calcium: 9.4 mg/dL (ref 8.4–10.4)
Chloride: 109 mEq/L (ref 98–109)
Creatinine: 1 mg/dL (ref 0.7–1.3)
Glucose: 118 mg/dl (ref 70–140)
Potassium: 4.1 mEq/L (ref 3.5–5.1)
Sodium: 141 mEq/L (ref 136–145)
Total Bilirubin: 0.41 mg/dL (ref 0.20–1.20)
Total Protein: 6.6 g/dL (ref 6.4–8.3)

## 2014-05-25 LAB — LACTATE DEHYDROGENASE (CC13): LDH: 146 U/L (ref 125–245)

## 2014-05-25 MED ORDER — INFLUENZA VAC SPLIT QUAD 0.5 ML IM SUSY
0.5000 mL | PREFILLED_SYRINGE | Freq: Once | INTRAMUSCULAR | Status: AC
  Administered 2014-05-25: 0.5 mL via INTRAMUSCULAR
  Filled 2014-05-25: qty 0.5

## 2014-05-25 NOTE — Assessment & Plan Note (Signed)
This is likely anemia of chronic disease and possibly related to underlying bone marrow disease. The patient denies recent history of bleeding such as epistaxis, hematuria or hematochezia. He is asymptomatic from the anemia. We will observe for now.

## 2014-05-25 NOTE — Assessment & Plan Note (Signed)
Clinically, he has no signs of disease progression. I will see him back on a yearly basis with history, physical examination and blood work. I educated the patient signs and symptoms to watch out for disease progression.

## 2014-05-25 NOTE — Telephone Encounter (Signed)
PER POF TO [facility] PT APPT-MAILED PT COPY OF [facility]

## 2014-05-25 NOTE — Progress Notes (Signed)
[place] OFFICE PROGRESS NOTE  Patient Care Team: Abigail Miyamoto, MD as PCP - General (Family Medicine)  SUMMARY OF ONCOLOGIC HISTORY: This is a very pleasant gentleman who was found to have abnormal CBC for the past 5 years. In April 2013, flow cytometry detectable monoclonal B-cell population suspicious for splenic/marginal zone lymphoma. Ultrasound confirmed splenomegaly. Due to lack of symptoms he was being observed.  INTERVAL HISTORY: Please see below for problem oriented charting. He feels well apart from recent heart attack in August. He had 3 stents placed. He is feeling well since.  REVIEW OF SYSTEMS:   Constitutional: Denies fevers, chills or abnormal weight loss Eyes: Denies blurriness of vision Ears, nose, mouth, throat, and face: Denies mucositis or sore throat Respiratory: Denies cough, dyspnea or wheezes Cardiovascular: Denies palpitation, chest discomfort or lower extremity swelling Gastrointestinal:  Denies nausea, heartburn or change in bowel habits Skin: Denies abnormal skin rashes Lymphatics: Denies new lymphadenopathy or easy bruising Neurological:Denies numbness, tingling or new weaknesses Behavioral/Psych: Mood is stable, no new changes  All other systems were reviewed with the patient and are negative.  I have reviewed the past medical history, past surgical history, social history and family history with the patient and they are unchanged from previous note.  ALLERGIES:  has No Known Allergies.  MEDICATIONS:  Current Outpatient Prescriptions  Medication Sig Dispense Refill  . aspirin EC 81 MG tablet Take 81 mg by mouth daily.      Marland Kitchen atorvastatin (LIPITOR) 80 MG tablet Take 80 mg by mouth daily.      . clopidogrel (PLAVIX) 75 MG tablet Take 75 mg by mouth daily.      . metFORMIN (GLUMETZA) 500 MG (MOD) 24 hr tablet Take 500 mg by mouth daily with breakfast.      . metoprolol tartrate (LOPRESSOR) 25 MG tablet Take 25 mg by mouth 2 (two)  times daily.      . nitroGLYCERIN (NITROSTAT) 0.4 MG SL tablet Place 0.4 mg under the tongue every 5 (five) minutes as needed for chest pain.      . traMADol (ULTRAM) 50 MG tablet Take one tablet as needed       Current Facility-Administered Medications  Medication Dose Route Frequency Provider Last Rate Last Dose  . Influenza vac split quadrivalent PF (FLUARIX) injection 0.5 mL  0.5 mL Intramuscular Once [name], MD        PHYSICAL EXAMINATION: ECOG PERFORMANCE STATUS: 0 - Asymptomatic  Filed Vitals:   05/25/14 1421  BP: 133/53  Pulse: 18  Temp: 98.8 F (37.1 C)  Resp: 18   Filed Weights   05/25/14 1421  Weight: 215 lb 8 oz (97.75 kg)    GENERAL:alert, no distress and comfortable. He is morbidly obese SKIN: skin color, texture, turgor are normal, no rashes or significant lesions EYES: normal, Conjunctiva are pink and non-injected, sclera clear OROPHARYNX:no exudate, no erythema and lips, buccal mucosa, and tongue normal  NECK: supple, thyroid normal size, non-tender, without nodularity LYMPH:  no palpable lymphadenopathy in the cervical, axillary or inguinal LUNGS: clear to auscultation and percussion with normal breathing effort HEART: regular rate & rhythm and no murmurs and no lower extremity edema ABDOMEN:abdomen soft, non-tender and normal bowel sounds. Palpable splenomegaly is detected Musculoskeletal:no cyanosis of digits and no clubbing  NEURO: alert & oriented x 3 with fluent speech, no focal motor/sensory deficits  LABORATORY DATA:  I have reviewed the data as listed    Component Value Date/Time   NA  142 06/30/2013 0844   NA 141 03/12/2012 1358   K 3.9 06/30/2013 0844   K 3.9 03/12/2012 1358   CL 102 10/07/2012 1013   CL 105 03/12/2012 1358   CO2 26 06/30/2013 0844   CO2 28 03/12/2012 1358   GLUCOSE 147* 06/30/2013 0844   GLUCOSE 199* 10/07/2012 1013   GLUCOSE 106* 03/12/2012 1358   BUN 25.0 06/30/2013 0844   BUN 24* 03/12/2012 1358   CREATININE 1.3  06/30/2013 0844   CREATININE 1.15 03/12/2012 1358   CALCIUM 9.7 06/30/2013 0844   CALCIUM 9.7 03/12/2012 1358   PROT 7.1 06/30/2013 0844   PROT 6.4 03/12/2012 1358   ALBUMIN 4.1 06/30/2013 0844   ALBUMIN 4.6 03/12/2012 1358   AST 24 06/30/2013 0844   AST 27 03/12/2012 1358   ALT 16 06/30/2013 0844   ALT 15 03/12/2012 1358   ALKPHOS 71 06/30/2013 0844   ALKPHOS 69 03/12/2012 1358   BILITOT 0.34 06/30/2013 0844   BILITOT 0.3 03/12/2012 1358   GFRNONAA >60 01/12/2008 1508   GFRAA  Value: >60        The eGFR has been calculated using the MDRD equation. This calculation has not been validated in all clinical 01/12/2008 1508    No results found for this basename: SPEP,  UPEP,   kappa and lambda light chains    Lab Results  Component Value Date   WBC 11.9* 05/25/2014   NEUTROABS 2.8 05/25/2014   HGB 12.0* 05/25/2014   HCT 38.1* 05/25/2014   MCV 80.5 05/25/2014   PLT 160 05/25/2014      Chemistry      Component Value Date/Time   NA 142 06/30/2013 0844   NA 141 03/12/2012 1358   K 3.9 06/30/2013 0844   K 3.9 03/12/2012 1358   CL 102 10/07/2012 1013   CL 105 03/12/2012 1358   CO2 26 06/30/2013 0844   CO2 28 03/12/2012 1358   BUN 25.0 06/30/2013 0844   BUN 24* 03/12/2012 1358   CREATININE 1.3 06/30/2013 0844   CREATININE 1.15 03/12/2012 1358      Component Value Date/Time   CALCIUM 9.7 06/30/2013 0844   CALCIUM 9.7 03/12/2012 1358   ALKPHOS 71 06/30/2013 0844   ALKPHOS 69 03/12/2012 1358   AST 24 06/30/2013 0844   AST 27 03/12/2012 1358   ALT 16 06/30/2013 0844   ALT 15 03/12/2012 1358   BILITOT 0.34 06/30/2013 0844   BILITOT 0.3 03/12/2012 1358     ASSESSMENT & PLAN:  Splenic marginal zone b-cell lymphoma Clinically, he has no signs of disease progression. I will see him back on a yearly basis with history, physical examination and blood work. I educated the patient signs and symptoms to watch out for disease progression.  Preventive measure We discussed the importance of  preventive care and reviewed the vaccination programs. He does not have any prior allergic reactions to influenza vaccination. He agrees to proceed with influenza vaccination today and we will administer it today at the clinic.   Anemia in neoplastic disease This is likely anemia of chronic disease and possibly related to underlying bone marrow disease. The patient denies recent history of bleeding such as epistaxis, hematuria or hematochezia. He is asymptomatic from the anemia. We will observe for now.        Orders Placed This Encounter  Procedures  . CBC with Differential    Standing Status: Future     Number of Occurrences:      Standing Expiration  Date: 06/29/2015  . Comprehensive metabolic panel    Standing Status: Future     Number of Occurrences:      Standing Expiration Date: 06/29/2015  . Lactate dehydrogenase    Standing Status: Future     Number of Occurrences:      Standing Expiration Date: 06/29/2015   All questions were answered. The patient knows to call the clinic with any problems, questions or concerns. No barriers to learning was detected. I spent 15 minutes counseling the patient face to face. The total time spent in the appointment was 20 minutes and more than 50% was on counseling and review of test results     [facility], [place], MD 05/25/2014 2:42 PM

## 2014-05-25 NOTE — Assessment & Plan Note (Signed)
We discussed the importance of preventive care and reviewed the vaccination programs. He does not have any prior allergic reactions to influenza vaccination. He agrees to proceed with influenza vaccination today and we will administer it today at the clinic.  

## 2014-05-27 ENCOUNTER — Ambulatory Visit (HOSPITAL_COMMUNITY): Payer: Medicare Other

## 2014-05-29 ENCOUNTER — Ambulatory Visit (HOSPITAL_COMMUNITY): Payer: Medicare Other

## 2014-06-01 ENCOUNTER — Ambulatory Visit (HOSPITAL_COMMUNITY): Payer: Medicare Other

## 2014-06-03 ENCOUNTER — Ambulatory Visit (HOSPITAL_COMMUNITY): Payer: Medicare Other

## 2014-06-05 ENCOUNTER — Ambulatory Visit (HOSPITAL_COMMUNITY): Payer: Medicare Other

## 2014-06-08 ENCOUNTER — Ambulatory Visit (HOSPITAL_COMMUNITY): Payer: Medicare Other

## 2014-06-10 ENCOUNTER — Ambulatory Visit (HOSPITAL_COMMUNITY): Payer: Medicare Other

## 2014-06-12 ENCOUNTER — Ambulatory Visit (HOSPITAL_COMMUNITY): Payer: Medicare Other

## 2014-06-15 ENCOUNTER — Ambulatory Visit (HOSPITAL_COMMUNITY): Payer: Medicare Other

## 2014-06-17 ENCOUNTER — Ambulatory Visit (HOSPITAL_COMMUNITY): Payer: Medicare Other

## 2014-06-19 ENCOUNTER — Ambulatory Visit (HOSPITAL_COMMUNITY): Payer: Medicare Other

## 2014-06-22 ENCOUNTER — Ambulatory Visit (HOSPITAL_COMMUNITY): Payer: Medicare Other

## 2014-06-24 ENCOUNTER — Ambulatory Visit (HOSPITAL_COMMUNITY): Payer: Medicare Other

## 2014-06-26 ENCOUNTER — Ambulatory Visit (HOSPITAL_COMMUNITY): Payer: Medicare Other

## 2014-06-29 ENCOUNTER — Ambulatory Visit (HOSPITAL_COMMUNITY): Payer: Medicare Other

## 2014-07-01 ENCOUNTER — Ambulatory Visit (HOSPITAL_COMMUNITY): Payer: Medicare Other

## 2014-07-03 ENCOUNTER — Ambulatory Visit (HOSPITAL_COMMUNITY): Payer: Medicare Other

## 2014-07-06 ENCOUNTER — Ambulatory Visit (HOSPITAL_COMMUNITY): Payer: Medicare Other

## 2014-07-08 ENCOUNTER — Ambulatory Visit (HOSPITAL_COMMUNITY): Payer: Medicare Other

## 2014-07-13 ENCOUNTER — Ambulatory Visit (HOSPITAL_COMMUNITY): Payer: Medicare Other

## 2014-07-15 ENCOUNTER — Ambulatory Visit (HOSPITAL_COMMUNITY): Payer: Medicare Other

## 2014-07-17 ENCOUNTER — Ambulatory Visit (HOSPITAL_COMMUNITY): Payer: Medicare Other

## 2014-07-20 ENCOUNTER — Ambulatory Visit (HOSPITAL_COMMUNITY): Payer: Medicare Other

## 2014-07-22 ENCOUNTER — Ambulatory Visit (HOSPITAL_COMMUNITY): Payer: Medicare Other

## 2014-07-24 ENCOUNTER — Ambulatory Visit (HOSPITAL_COMMUNITY): Payer: Medicare Other

## 2014-07-27 ENCOUNTER — Ambulatory Visit (HOSPITAL_COMMUNITY): Payer: Medicare Other

## 2014-07-29 ENCOUNTER — Ambulatory Visit (HOSPITAL_COMMUNITY): Payer: Medicare Other

## 2014-07-31 ENCOUNTER — Ambulatory Visit (HOSPITAL_COMMUNITY): Payer: Medicare Other

## 2014-08-03 ENCOUNTER — Ambulatory Visit (HOSPITAL_COMMUNITY): Payer: Medicare Other

## 2014-08-05 ENCOUNTER — Ambulatory Visit (HOSPITAL_COMMUNITY): Payer: Medicare Other

## 2015-02-08 ENCOUNTER — Other Ambulatory Visit: Payer: Self-pay

## 2015-03-04 ENCOUNTER — Other Ambulatory Visit: Payer: Self-pay | Admitting: General Surgery

## 2015-03-18 ENCOUNTER — Encounter: Payer: Self-pay | Admitting: Gastroenterology

## 2015-04-04 ENCOUNTER — Ambulatory Visit: Payer: Self-pay | Admitting: General Surgery

## 2015-04-04 NOTE — H&P (Signed)
Marcus Taylor 03/04/2015 4:00 PM Location: [facility] Patient #: [id number] DOB: 1941-05-23 Married / Language: English / Race: White Male  History of Present Illness Marcus Taylor Marcus Taylor; 03/05/2015 4:41 PM) Patient words: hernia.  The patient is a 74 year old male who presents with an inguinal hernia. He is referred by Dr Marcus Taylor for evaluation of right inguinal hernia. He states that about 6 weeks ago he had some discomfort in his right groin. It wasn't anything terribly painful. Then he developed a constant bulge. He ended up seeing his primary Marcus physician who was not able to completely reduce the hernia. He states the last 2-3 weeks he has not had any groin pain. He denies any nausea, vomiting, diarrhea or current constipation. It does tend to protrude with heavy lifting. He's had a prior open left inguinal hernia repair many years ago. He states that he did have a difficult time voiding spontaneously after that Taylor. He does endorse some weak stream and nocturia. He states that he had a heart attack last August and underwent cardiac catheterization with a stent placement at [facility]. He is still taking Plavix. He sees his cardiologist regularly he states. He denies any chest pain, chest pressure, orthopnea, paroxysmal dyspnea, or dyspnea on exertion. He denies any TIAs or amaurosis fugax. His cardiologist according to Marcus Taylor in Epic is Dr Marcus Taylor (office [phone])   Problem List/Past Medical Marcus Taylor, Marcus Taylor; 03/05/2015 4:44 PM) CAD IN NATIVE ARTERY (414.01  I25.10) CONTROLLED TYPE 2 DIABETES MELLITUS WITHOUT COMPLICATION, WITHOUT LONG-TERM CURRENT USE OF INSULIN (250.00  E11.9) HYPERTENSION, BENIGN (401.1  I10) RIGHT INGUINAL HERNIA (550.90  K40.90)  Past Surgical History Marcus Taylor, Marcus Taylor; 03/04/2015 4:04 PM) Gallbladder Taylor - Laparoscopic  Allergies (Marcus Taylor, Marcus Taylor; 03/04/2015 4:05 PM) No Known Drug  Allergies07/21/2016  Medication History Marcus Taylor, Marcus Taylor; 03/05/2015 4:44 PM) Aspirin (81MG  Tablet DR, Oral) Active. Atorvastatin Calcium (80MG  Tablet, Oral) Active. Clopidogrel Bisulfate (75MG  Tablet, Oral) Active. Lisinopril (10MG  Tablet, Oral) Active. MetFORMIN HCl ER (500MG  Tablet ER 24HR, Oral) Active. Metoprolol Succinate ER (50MG  Tablet ER 24HR, Oral) Active. Medications Reconciled Flomax (0.4MG  Capsule, 1 (one) Capsule Oral daily, Taken starting 03/04/2015) Active. (start taking 2 weeks before hernia Taylor)  Review of Systems (Marcus Taylor; 03/04/2015 4:04 PM) General Not Present- Appetite Loss, Chills, Fatigue, Fever, Night Sweats, Weight Gain and Weight Loss. HEENT Present- Wears glasses/contact lenses. Not Present- Earache, Hearing Loss, Hoarseness, Nose Bleed, Oral Ulcers, Ringing in the Ears, Seasonal Allergies, Sinus Pain, Sore Throat, Visual Disturbances and Yellow Eyes. Respiratory Not Present- Bloody sputum, Chronic Cough, Difficulty Breathing, Snoring and Wheezing. Breast Not Present- Breast Mass, Breast Pain, Nipple Discharge and Skin Changes. Cardiovascular Not Present- Chest Pain, Difficulty Breathing Lying Down, Leg Cramps, Palpitations, Rapid Heart Rate, Shortness of Breath and Swelling of Extremities. Gastrointestinal Not Present- Abdominal Pain, Bloating, Bloody Stool, Change in Bowel Habits, Chronic diarrhea, Constipation, Difficulty Swallowing, Excessive gas, Gets full quickly at meals, Hemorrhoids, Indigestion, Nausea, Rectal Pain and Vomiting. Male Genitourinary Not Present- Blood in Urine, Change in Urinary Stream, Frequency, Impotence, Nocturia, Painful Urination, Urgency and Urine Leakage.   Vitals (Marcus Taylor; 03/04/2015 4:05 PM) 03/04/2015 4:04 PM Weight: 205 lb Height: 71in Body Surface Area: 2.16 m Body Mass Index: 28.59 kg/m Temp.: 51F(Temporal)  Pulse: 81 (Regular)  BP: 126/74 (Sitting, Left Arm,  Standard)    Physical Exam Marcus Hiss M. Lakeshia Dohner Marcus Taylor; 03/05/2015 4:37 PM) General Mental Status-Alert. General Appearance-Consistent with stated age.  Hydration-Well hydrated. Voice-Normal.  Head and Neck Head-normocephalic, atraumatic with no lesions or palpable masses. Trachea-midline. Thyroid Gland Characteristics - normal size and consistency.  Eye Eyeball - Bilateral-Extraocular movements intact. Sclera/Conjunctiva - Bilateral-No scleral icterus.  Chest and Lung Exam Chest and lung exam reveals -quiet, even and easy respiratory effort with no use of accessory muscles and on auscultation, normal breath sounds, no adventitious sounds and normal vocal resonance. Inspection Chest Wall - Normal. Back - normal.  Breast - Did not examine.  Cardiovascular Cardiovascular examination reveals -normal heart sounds, regular rate and rhythm with no murmurs and normal pedal pulses bilaterally.  Abdomen Inspection Inspection of the abdomen reveals - No Hernias. Skin - Scar - no surgical scars. Palpation/Percussion Palpation and Percussion of the abdomen reveal - Soft, Non Tender, No Rebound tenderness, No Rigidity (guarding) and No hepatosplenomegaly. Auscultation Auscultation of the abdomen reveals - Bowel sounds normal.  Male Genitourinary Note: old left inguinal incision; both testicles down, no scrotal masses. has bag of worms on right side >left side; reducible RIH, nontender. +enlarged deep ring on right; no evidence of recurrence on left   Peripheral Vascular Upper Extremity Palpation - Pulses bilaterally normal.  Neurologic Neurologic evaluation reveals -alert and oriented x 3 with no impairment of recent or remote memory. Mental Status-Normal.  Neuropsychiatric The patient's mood and affect are described as -normal. Judgment and Insight-insight is appropriate concerning matters relevant to self.  Musculoskeletal Normal Exam - Left-Upper  Extremity Strength Normal and Lower Extremity Strength Normal. Normal Exam - Right-Upper Extremity Strength Normal and Lower Extremity Strength Normal.  Lymphatic Head & Neck  General Head & Neck Lymphatics: Bilateral - Description - Normal. Axillary - Did not examine. Femoral & Inguinal - Did not examine.    Assessment & Plan Marcus Hiss M. Hayze Gazda Marcus Taylor; 03/05/2015 4:44 PM) RIGHT INGUINAL HERNIA (550.90  K40.90) Impression: We discussed the etiology of inguinal hernias. We discussed the signs & symptoms of incarceration & strangulation. We discussed non-operative and operative management.  The patient has elected to proceed with [place]  I described the procedure in detail. The patient was given educational material. We discussed the risks and benefits including but not limited to bleeding, infection, chronic inguinal pain, nerve entrapment, hernia recurrence, mesh complications, hematoma formation, urinary retention, injury to the testicles or the ovaries, numbness in the groin, blood clots, injury to the surrounding structures, and anesthesia risk. We also discussed the typical post operative recovery course, including no heavy lifting for 4-6 weeks. I explained that the likelihood of improvement of their symptoms is good.  Because of his nocturia/BPH symptoms and difficulty urinating after his last hernia repair, we will place him on perioperative Flomax to try to decrease his risk of urinary retention.  I also explained that we would need to get cardiac clearance prior to scheduling him for Taylor. We will also need permission to hold his Plavix perioperatively as well Current Plans  Pt Education - Pamphlet Given - Laparoscopic Hernia Repair: discussed with patient and provided information. Pt Education - [facility] Free Text Education/Instructions: discussed with patient and provided information. Started Flomax 0.4MG , 1 (one) Capsule daily, #21, 21  days starting 03/04/2015, No Refill. Local Order: start taking 2 weeks before hernia Taylor HYPERTENSION, BENIGN (401.1  I10) CONTROLLED TYPE 2 DIABETES MELLITUS WITHOUT COMPLICATION, WITHOUT LONG-TERM CURRENT USE OF INSULIN (250.00  E11.9) CAD IN NATIVE ARTERY (414.01  I25.10)  Leighton Ruff. Redmond Pulling, Marcus Taylor, FACS General, Bariatric, & Minimally Invasive Taylor  [place], [place]

## 2015-05-24 ENCOUNTER — Encounter (HOSPITAL_COMMUNITY): Payer: Self-pay

## 2015-05-24 ENCOUNTER — Encounter (HOSPITAL_COMMUNITY)
Admission: RE | Admit: 2015-05-24 | Discharge: 2015-05-24 | Disposition: A | Discharge status: Home / Self Care | Payer: Medicare Other | Source: Ambulatory Visit | Attending: General Surgery | Admitting: General Surgery

## 2015-05-24 DIAGNOSIS — Z79899 Other long term (current) drug therapy: Secondary | ICD-10-CM | POA: Diagnosis not present

## 2015-05-24 DIAGNOSIS — Z955 Presence of coronary angioplasty implant and graft: Secondary | ICD-10-CM | POA: Diagnosis not present

## 2015-05-24 DIAGNOSIS — K4090 Unilateral inguinal hernia, without obstruction or gangrene, not specified as recurrent: Secondary | ICD-10-CM | POA: Diagnosis not present

## 2015-05-24 DIAGNOSIS — Z7982 Long term (current) use of aspirin: Secondary | ICD-10-CM | POA: Diagnosis not present

## 2015-05-24 DIAGNOSIS — I252 Old myocardial infarction: Secondary | ICD-10-CM | POA: Diagnosis not present

## 2015-05-24 DIAGNOSIS — Z87891 Personal history of nicotine dependence: Secondary | ICD-10-CM | POA: Diagnosis not present

## 2015-05-24 DIAGNOSIS — Z7902 Long term (current) use of antithrombotics/antiplatelets: Secondary | ICD-10-CM | POA: Diagnosis not present

## 2015-05-24 DIAGNOSIS — Z7984 Long term (current) use of oral hypoglycemic drugs: Secondary | ICD-10-CM | POA: Diagnosis not present

## 2015-05-24 DIAGNOSIS — I2510 Atherosclerotic heart disease of native coronary artery without angina pectoris: Secondary | ICD-10-CM | POA: Diagnosis not present

## 2015-05-24 DIAGNOSIS — E119 Type 2 diabetes mellitus without complications: Secondary | ICD-10-CM | POA: Diagnosis not present

## 2015-05-24 DIAGNOSIS — E781 Pure hyperglyceridemia: Secondary | ICD-10-CM | POA: Diagnosis not present

## 2015-05-24 DIAGNOSIS — I10 Essential (primary) hypertension: Secondary | ICD-10-CM | POA: Diagnosis not present

## 2015-05-24 DIAGNOSIS — M1990 Unspecified osteoarthritis, unspecified site: Secondary | ICD-10-CM | POA: Diagnosis not present

## 2015-05-24 HISTORY — DX: Other specified postprocedural states: R11.2

## 2015-05-24 HISTORY — DX: Anemia, unspecified: D64.9

## 2015-05-24 HISTORY — DX: Unspecified osteoarthritis, unspecified site: M19.90

## 2015-05-24 HISTORY — DX: Other specified postprocedural states: Z98.890

## 2015-05-24 HISTORY — DX: Atherosclerotic heart disease of native coronary artery without angina pectoris: I25.10

## 2015-05-24 HISTORY — DX: Nausea with vomiting, unspecified: R11.2

## 2015-05-24 LAB — CBC WITH DIFFERENTIAL/PLATELET
BASOS PCT: 0 %
Basophils Absolute: 0 10*3/uL (ref 0.0–0.1)
EOS ABS: 0.2 10*3/uL (ref 0.0–0.7)
Eosinophils Relative: 1 %
HCT: 37 % — ABNORMAL LOW (ref 39.0–52.0)
Hemoglobin: 11.4 g/dL — ABNORMAL LOW (ref 13.0–17.0)
LYMPHS ABS: 12.2 10*3/uL — AB (ref 0.7–4.0)
Lymphocytes Relative: 78 %
MCH: 25.7 pg — AB (ref 26.0–34.0)
MCHC: 30.8 g/dL (ref 30.0–36.0)
MCV: 83.3 fL (ref 78.0–100.0)
MONO ABS: 0.5 10*3/uL (ref 0.1–1.0)
Monocytes Relative: 3 %
NEUTROS PCT: 18 %
Neutro Abs: 2.8 10*3/uL (ref 1.7–7.7)
PLATELETS: 157 10*3/uL (ref 150–400)
RBC: 4.44 MIL/uL (ref 4.22–5.81)
RDW: 16.7 % — AB (ref 11.5–15.5)
WBC: 15.7 10*3/uL — AB (ref 4.0–10.5)

## 2015-05-24 LAB — BASIC METABOLIC PANEL
Anion gap: 6 (ref 5–15)
BUN: 16 mg/dL (ref 6–20)
CALCIUM: 9 mg/dL (ref 8.9–10.3)
CO2: 29 mmol/L (ref 22–32)
CREATININE: 0.84 mg/dL (ref 0.61–1.24)
Chloride: 106 mmol/L (ref 101–111)
GFR calc non Af Amer: >60 mL/min (ref >60)
Glucose, Bld: 119 mg/dL — ABNORMAL HIGH (ref 65–99)
Potassium: 4.6 mmol/L (ref 3.5–5.1)
SODIUM: 141 mmol/L (ref 135–145)

## 2015-05-24 NOTE — Patient Instructions (Signed)
Marcus Taylor  05/24/2015   Your procedure is scheduled on: May 27, 2015  Report to [facility] Main  Entrance take [place]  elevators to 3rd floor to  [place] at 5:30 AM.  Call this number if you have problems the morning of surgery [phone]   Remember: ONLY 1 PERSON MAY GO WITH YOU TO SHORT STAY TO GET  READY MORNING OF [place].  Do not eat food or drink liquids :After Midnight.     Take these medicines the morning of surgery with A SIP OF WATER: Metoprolol (lopressor), Flomax DO NOT TAKE ANY DIABETIC MEDICATIONS DAY OF YOUR SURGERY                               You may not have any metal on your body including hair pins and              piercings  Do not wear jewelry lotions, powders or perfumes, deodorant              Men may shave face and neck.   Do not bring valuables to the hospital. [place].  Contacts, dentures or bridgework may not be worn into surgery.  .     Patients discharged the day of surgery will not be allowed to drive home.  Name and phone number of your driver: Ricky Gallery (wife)             Special Instructions:  Coughing and deep breathing , leg exercises             Please read over the following fact sheets you were given: _____________________________________________________________________             [facility] - Preparing for Surgery Before surgery, you can play an important role.  Because skin is not sterile, your skin needs to be as free of germs as possible.  You can reduce the number of germs on your skin by washing with CHG (chlorahexidine gluconate) soap before surgery.  CHG is an antiseptic cleaner which kills germs and bonds with the skin to continue killing germs even after washing. Please DO NOT use if you have an allergy to CHG or antibacterial soaps.  If your skin becomes reddened/irritated stop using the CHG and inform your nurse when  you arrive at Short Stay. Do not shave (including legs and underarms) for at least 48 hours prior to the first CHG shower.  You may shave your face/neck. Please follow these instructions carefully:  1.  Shower with CHG Soap the night before surgery and the  morning of Surgery.  2.  If you choose to wash your hair, wash your hair first as usual with your  normal  shampoo.  3.  After you shampoo, rinse your hair and body thoroughly to remove the  shampoo.                           4.  Use CHG as you would any other liquid soap.  You can apply chg directly  to the skin and wash  Gently with a scrungie or clean washcloth.  5.  Apply the CHG Soap to your body ONLY FROM THE NECK DOWN.   Do not use on face/ open                           Wound or open sores. Avoid contact with eyes, ears mouth and genitals (private parts).                       Wash face,  Genitals (private parts) with your normal soap.             6.  Wash thoroughly, paying special attention to the area where your surgery  will be performed.  7.  Thoroughly rinse your body with warm water from the neck down.  8.  DO NOT shower/wash with your normal soap after using and rinsing off  the CHG Soap.                9.  Pat yourself dry with a clean towel.            10.  Wear clean pajamas.            11.  Place clean sheets on your bed the night of your first shower and do not  sleep with pets. Day of Surgery : Do not apply any lotions/deodorants the morning of surgery.  Please wear clean clothes to the hospital/surgery center.  FAILURE TO FOLLOW THESE INSTRUCTIONS MAY RESULT IN THE CANCELLATION OF YOUR SURGERY PATIENT SIGNATURE_________________________________  NURSE SIGNATURE__________________________________  ________________________________________________________________________

## 2015-05-24 NOTE — Progress Notes (Signed)
03-18-15 - LOV - Dr. Burnis Medin & Dr. Justice Rocher (cardio) - in chart (cardiac clearance in Care Everywhere)-EPIC  05-25-14 - LOV - Dr. Alvy Bimler (onc) - EPIC

## 2015-05-24 NOTE — Progress Notes (Signed)
05-24-15 - Faxed CBCw/diff lab results from preop appt. On 05-24-15 to Dr. Greer Pickerel via [facility]

## 2015-05-25 LAB — PATHOLOGIST SMEAR REVIEW

## 2015-05-27 ENCOUNTER — Ambulatory Visit (HOSPITAL_COMMUNITY): Payer: Medicare Other | Admitting: Certified Registered"

## 2015-05-27 ENCOUNTER — Encounter (HOSPITAL_COMMUNITY)
Admission: RE | Disposition: A | Discharge status: Home / Self Care | Payer: Self-pay | Source: Ambulatory Visit | Attending: General Surgery

## 2015-05-27 ENCOUNTER — Ambulatory Visit (HOSPITAL_COMMUNITY)
Admission: RE | Admit: 2015-05-27 | Discharge: 2015-05-27 | Disposition: A | Discharge status: Home / Self Care | Payer: Medicare Other | Source: Ambulatory Visit | Attending: General Surgery | Admitting: General Surgery

## 2015-05-27 ENCOUNTER — Encounter (HOSPITAL_COMMUNITY): Payer: Self-pay

## 2015-05-27 DIAGNOSIS — E119 Type 2 diabetes mellitus without complications: Secondary | ICD-10-CM | POA: Diagnosis not present

## 2015-05-27 DIAGNOSIS — E781 Pure hyperglyceridemia: Secondary | ICD-10-CM | POA: Insufficient documentation

## 2015-05-27 DIAGNOSIS — M1990 Unspecified osteoarthritis, unspecified site: Secondary | ICD-10-CM | POA: Insufficient documentation

## 2015-05-27 DIAGNOSIS — K4090 Unilateral inguinal hernia, without obstruction or gangrene, not specified as recurrent: Secondary | ICD-10-CM | POA: Insufficient documentation

## 2015-05-27 DIAGNOSIS — I2510 Atherosclerotic heart disease of native coronary artery without angina pectoris: Secondary | ICD-10-CM | POA: Insufficient documentation

## 2015-05-27 DIAGNOSIS — Z955 Presence of coronary angioplasty implant and graft: Secondary | ICD-10-CM | POA: Insufficient documentation

## 2015-05-27 DIAGNOSIS — Z79899 Other long term (current) drug therapy: Secondary | ICD-10-CM | POA: Insufficient documentation

## 2015-05-27 DIAGNOSIS — I10 Essential (primary) hypertension: Secondary | ICD-10-CM | POA: Insufficient documentation

## 2015-05-27 DIAGNOSIS — I252 Old myocardial infarction: Secondary | ICD-10-CM | POA: Insufficient documentation

## 2015-05-27 DIAGNOSIS — Z7982 Long term (current) use of aspirin: Secondary | ICD-10-CM | POA: Insufficient documentation

## 2015-05-27 DIAGNOSIS — Z7902 Long term (current) use of antithrombotics/antiplatelets: Secondary | ICD-10-CM | POA: Insufficient documentation

## 2015-05-27 DIAGNOSIS — Z87891 Personal history of nicotine dependence: Secondary | ICD-10-CM | POA: Insufficient documentation

## 2015-05-27 DIAGNOSIS — Z7984 Long term (current) use of oral hypoglycemic drugs: Secondary | ICD-10-CM | POA: Insufficient documentation

## 2015-05-27 HISTORY — PX: INSERTION OF MESH: SHX5868

## 2015-05-27 HISTORY — PX: INGUINAL HERNIA REPAIR: SHX194

## 2015-05-27 LAB — GLUCOSE, CAPILLARY
GLUCOSE-CAPILLARY: 99 mg/dL (ref 65–99)
GLUCOSE-CAPILLARY: 177 mg/dL — AB (ref 65–99)

## 2015-05-27 SURGERY — REPAIR, HERNIA, INGUINAL, LAPAROSCOPIC
Anesthesia: General | Site: Groin | Laterality: Right

## 2015-05-27 MED ORDER — CEFAZOLIN SODIUM-DEXTROSE 2-3 GM-% IV SOLR
2.0000 g | INTRAVENOUS | Status: AC
  Administered 2015-05-27: 2 g via INTRAVENOUS

## 2015-05-27 MED ORDER — DEXAMETHASONE SODIUM PHOSPHATE 10 MG/ML IJ SOLN
INTRAMUSCULAR | Status: AC
  Filled 2015-05-27: qty 1

## 2015-05-27 MED ORDER — EPHEDRINE SULFATE 50 MG/ML IJ SOLN
INTRAMUSCULAR | Status: AC
  Filled 2015-05-27: qty 1

## 2015-05-27 MED ORDER — BUPIVACAINE-EPINEPHRINE 0.25% -1:200000 IJ SOLN
INTRAMUSCULAR | Status: AC
  Filled 2015-05-27: qty 1

## 2015-05-27 MED ORDER — LACTATED RINGERS IV SOLN: INTRAVENOUS | Status: DC

## 2015-05-27 MED ORDER — PROPOFOL 10 MG/ML IV BOLUS
INTRAVENOUS | Status: AC
  Filled 2015-05-27: qty 20

## 2015-05-27 MED ORDER — LIDOCAINE HCL (PF) 2 % IJ SOLN
INTRAMUSCULAR | Status: DC | PRN
  Administered 2015-05-27: 30 mg via INTRADERMAL

## 2015-05-27 MED ORDER — GLYCOPYRROLATE 0.2 MG/ML IJ SOLN
INTRAMUSCULAR | Status: DC | PRN
  Administered 2015-05-27: 0.4 mg via INTRAVENOUS
  Administered 2015-05-27 (×2): 0.2 mg via INTRAVENOUS

## 2015-05-27 MED ORDER — OXYCODONE HCL 5 MG PO TABS: 5.0000 mg | ORAL_TABLET | ORAL | Status: DC | PRN

## 2015-05-27 MED ORDER — ROCURONIUM BROMIDE 100 MG/10ML IV SOLN
INTRAVENOUS | Status: AC
  Filled 2015-05-27: qty 1

## 2015-05-27 MED ORDER — GLYCOPYRROLATE 0.2 MG/ML IJ SOLN
INTRAMUSCULAR | Status: AC
  Filled 2015-05-27: qty 9

## 2015-05-27 MED ORDER — CHLORHEXIDINE GLUCONATE 4 % EX LIQD: 1.0000 "application " | Freq: Once | CUTANEOUS | Status: DC

## 2015-05-27 MED ORDER — FENTANYL CITRATE (PF) 250 MCG/5ML IJ SOLN
INTRAMUSCULAR | Status: AC
  Filled 2015-05-27: qty 25

## 2015-05-27 MED ORDER — ONDANSETRON HCL 4 MG/2ML IJ SOLN
INTRAMUSCULAR | Status: AC
  Filled 2015-05-27: qty 2

## 2015-05-27 MED ORDER — CEFAZOLIN SODIUM-DEXTROSE 2-3 GM-% IV SOLR
INTRAVENOUS | Status: AC
  Filled 2015-05-27: qty 50

## 2015-05-27 MED ORDER — LIDOCAINE HCL (CARDIAC) 20 MG/ML IV SOLN
INTRAVENOUS | Status: AC
  Filled 2015-05-27: qty 5

## 2015-05-27 MED ORDER — SODIUM CHLORIDE 0.9 % IV SOLN: INTRAVENOUS | Status: DC

## 2015-05-27 MED ORDER — BUPIVACAINE LIPOSOME 1.3 % IJ SUSP
20.0000 mL | Freq: Once | INTRAMUSCULAR | Status: DC
  Filled 2015-05-27: qty 20

## 2015-05-27 MED ORDER — LACTATED RINGERS IV SOLN
INTRAVENOUS | Status: DC | PRN
  Administered 2015-05-27: 07:00:00 via INTRAVENOUS

## 2015-05-27 MED ORDER — MIDAZOLAM HCL 2 MG/2ML IJ SOLN
INTRAMUSCULAR | Status: AC
  Filled 2015-05-27: qty 4

## 2015-05-27 MED ORDER — SODIUM CHLORIDE 0.9 % IJ SOLN
INTRAMUSCULAR | Status: AC
  Filled 2015-05-27: qty 10

## 2015-05-27 MED ORDER — ACETAMINOPHEN 325 MG PO TABS: 650.0000 mg | ORAL_TABLET | ORAL | Status: DC | PRN

## 2015-05-27 MED ORDER — SODIUM CHLORIDE 0.9 % IJ SOLN
INTRAMUSCULAR | Status: AC
Start: 2015-05-27 — End: 2015-05-27
  Filled 2015-05-27: qty 50

## 2015-05-27 MED ORDER — ONDANSETRON HCL 4 MG/2ML IJ SOLN
INTRAMUSCULAR | Status: DC | PRN
  Administered 2015-05-27: 4 mg via INTRAVENOUS

## 2015-05-27 MED ORDER — GLYCOPYRROLATE 0.2 MG/ML IJ SOLN
INTRAMUSCULAR | Status: AC
  Filled 2015-05-27: qty 1

## 2015-05-27 MED ORDER — MIDAZOLAM HCL 5 MG/5ML IJ SOLN
INTRAMUSCULAR | Status: DC | PRN
  Administered 2015-05-27 (×2): 1 mg via INTRAVENOUS

## 2015-05-27 MED ORDER — DEXAMETHASONE SODIUM PHOSPHATE 10 MG/ML IJ SOLN
INTRAMUSCULAR | Status: DC | PRN
  Administered 2015-05-27: 10 mg via INTRAVENOUS

## 2015-05-27 MED ORDER — PROMETHAZINE HCL 25 MG/ML IJ SOLN: 6.2500 mg | INTRAMUSCULAR | Status: DC | PRN

## 2015-05-27 MED ORDER — EPHEDRINE SULFATE 50 MG/ML IJ SOLN
INTRAMUSCULAR | Status: DC | PRN
  Administered 2015-05-27: 10 mg via INTRAVENOUS

## 2015-05-27 MED ORDER — NEOSTIGMINE METHYLSULFATE 10 MG/10ML IV SOLN
INTRAVENOUS | Status: DC | PRN
  Administered 2015-05-27: 3 mg via INTRAVENOUS

## 2015-05-27 MED ORDER — 0.9 % SODIUM CHLORIDE (POUR BTL) OPTIME
TOPICAL | Status: DC | PRN
  Administered 2015-05-27: 1000 mL

## 2015-05-27 MED ORDER — SODIUM CHLORIDE 0.9 % IJ SOLN: 3.0000 mL | INTRAMUSCULAR | Status: DC | PRN

## 2015-05-27 MED ORDER — SODIUM CHLORIDE 0.9 % IV SOLN: 20.0000 mL | Freq: Once | INTRAVENOUS | Status: DC

## 2015-05-27 MED ORDER — SODIUM CHLORIDE 0.9 % IJ SOLN: 3.0000 mL | Freq: Two times a day (BID) | INTRAMUSCULAR | Status: DC

## 2015-05-27 MED ORDER — BUPIVACAINE-EPINEPHRINE 0.25% -1:200000 IJ SOLN
INTRAMUSCULAR | Status: DC | PRN
  Administered 2015-05-27: 50 mL

## 2015-05-27 MED ORDER — ACETAMINOPHEN 650 MG RE SUPP
650.0000 mg | RECTAL | Status: DC | PRN
  Filled 2015-05-27: qty 1

## 2015-05-27 MED ORDER — ROCURONIUM BROMIDE 100 MG/10ML IV SOLN
INTRAVENOUS | Status: DC | PRN
  Administered 2015-05-27: 10 mg via INTRAVENOUS
  Administered 2015-05-27: 40 mg via INTRAVENOUS

## 2015-05-27 MED ORDER — PROPOFOL 10 MG/ML IV BOLUS
INTRAVENOUS | Status: DC | PRN
  Administered 2015-05-27: 150 mg via INTRAVENOUS

## 2015-05-27 MED ORDER — FENTANYL CITRATE (PF) 100 MCG/2ML IJ SOLN
INTRAMUSCULAR | Status: DC | PRN
  Administered 2015-05-27: 100 ug via INTRAVENOUS
  Administered 2015-05-27 (×3): 50 ug via INTRAVENOUS

## 2015-05-27 MED ORDER — HYDROMORPHONE HCL 1 MG/ML IJ SOLN: 0.2500 mg | INTRAMUSCULAR | Status: DC | PRN

## 2015-05-27 MED ORDER — SODIUM CHLORIDE 0.9 % IV SOLN: 250.0000 mL | INTRAVENOUS | Status: DC | PRN

## 2015-05-27 MED ORDER — MORPHINE SULFATE (PF) 10 MG/ML IV SOLN: 2.0000 mg | INTRAVENOUS | Status: DC | PRN

## 2015-05-27 SURGICAL SUPPLY — 42 items
ADH SKN CLS APL DERMABOND .7 (GAUZE/BANDAGES/DRESSINGS) ×2
APL SKNCLS STERI-STRIP NONHPOA (GAUZE/BANDAGES/DRESSINGS)
BANDAGE ADH SHEER 1  50/CT (GAUZE/BANDAGES/DRESSINGS) IMPLANT
BENZOIN TINCTURE PRP APPL 2/3 (GAUZE/BANDAGES/DRESSINGS) IMPLANT
CABLE HIGH FREQUENCY MONO STRZ (ELECTRODE) ×1 IMPLANT
COVER SURGICAL LIGHT HANDLE (MISCELLANEOUS) ×3 IMPLANT
DECANTER SPIKE VIAL GLASS SM (MISCELLANEOUS) ×2 IMPLANT
DERMABOND ADVANCED (GAUZE/BANDAGES/DRESSINGS) ×1
DERMABOND ADVANCED .7 DNX12 (GAUZE/BANDAGES/DRESSINGS) IMPLANT
DEVICE SECURE STRAP 25 ABSORB (INSTRUMENTS) ×1 IMPLANT
DRAPE LAPAROSCOPIC ABDOMINAL (DRAPES) ×3 IMPLANT
DRSG TEGADERM 2-3/8X2-3/4 SM (GAUZE/BANDAGES/DRESSINGS) IMPLANT
DRSG TEGADERM 4X4.75 (GAUZE/BANDAGES/DRESSINGS) IMPLANT
ELECT REM PT RETURN 9FT ADLT (ELECTROSURGICAL) ×3
ELECTRODE REM PT RTRN 9FT ADLT (ELECTROSURGICAL) ×2 IMPLANT
ENDOLOOP SUT PDS II  0 18 (SUTURE) ×1
ENDOLOOP SUT PDS II 0 18 (SUTURE) IMPLANT
GLOVE BIO SURGEON STRL SZ7.5 (GLOVE) ×3 IMPLANT
GLOVE BIOGEL M STRL SZ7.5 (GLOVE) IMPLANT
GLOVE INDICATOR 8.0 STRL GRN (GLOVE) ×3 IMPLANT
GOWN STRL REUS W/TWL XL LVL3 (GOWN DISPOSABLE) ×9 IMPLANT
KIT BASIN OR (CUSTOM PROCEDURE TRAY) ×3 IMPLANT
MESH 3DMAX 4X6 RT LRG (Mesh General) ×1 IMPLANT
NS IRRIG 1000ML POUR BTL (IV SOLUTION) ×3 IMPLANT
RELOAD STAPLE 4.0 BLU F/HERNIA (INSTRUMENTS) IMPLANT
RELOAD STAPLE 4.8 BLK F/HERNIA (STAPLE) IMPLANT
RELOAD STAPLE HERNIA 4.0 BLUE (INSTRUMENTS) IMPLANT
RELOAD STAPLE HERNIA 4.8 BLK (STAPLE) IMPLANT
SCALPEL HARMONIC ACE (MISCELLANEOUS) IMPLANT
SCISSORS LAP 5X35 DISP (ENDOMECHANICALS) ×1 IMPLANT
SET IRRIG TUBING LAPAROSCOPIC (IRRIGATION / IRRIGATOR) IMPLANT
SOLUTION ANTI FOG 6CC (MISCELLANEOUS) ×2 IMPLANT
STAPLER HERNIA 12 8.5 360D (INSTRUMENTS) ×2 IMPLANT
STRIP CLOSURE SKIN 1/2X4 (GAUZE/BANDAGES/DRESSINGS) IMPLANT
SUT MNCRL AB 4-0 PS2 18 (SUTURE) ×3 IMPLANT
TOWEL OR 17X26 10 PK STRL BLUE (TOWEL DISPOSABLE) ×3 IMPLANT
TOWEL OR NON WOVEN STRL DISP B (DISPOSABLE) ×3 IMPLANT
TRAY LAPAROSCOPIC (CUSTOM PROCEDURE TRAY) ×3 IMPLANT
TROCAR BLADELESS OPT 5 75 (ENDOMECHANICALS) ×3 IMPLANT
TROCAR SLEEVE XCEL 5X75 (ENDOMECHANICALS) ×3 IMPLANT
TROCAR XCEL BLUNT TIP 100MML (ENDOMECHANICALS) ×3 IMPLANT
TUBING INSUFFLATION 10FT LAP (TUBING) ×3 IMPLANT

## 2015-05-27 NOTE — Progress Notes (Signed)
Notified Dr. Redmond Pulling of pt's inability to void.  Informed of pt's bladder scan volume.  Dr. Redmond Pulling states that Mr. Bonnee Quin cannot go home until pt voids.  No further orders given.  Report given to [facility] RN.

## 2015-05-27 NOTE — Progress Notes (Signed)
[name] up in room, attempting to void again but was unsuccessful.

## 2015-05-27 NOTE — Anesthesia Procedure Notes (Signed)
Procedure Name: Intubation Date/Time: 05/27/2015 7:41 AM Performed by: Lajuana Carry E Pre-anesthesia Checklist: Patient identified, Emergency Drugs available, Suction available and Patient being monitored Patient Re-evaluated:Patient Re-evaluated prior to inductionOxygen Delivery Method: Circle System Utilized Preoxygenation: Pre-oxygenation with 100% oxygen Intubation Type: IV induction Ventilation: Mask ventilation without difficulty and Oral airway inserted - appropriate to patient size Laryngoscope Size: Mac and 3 Grade View: Grade I Tube type: Oral Tube size: 7.5 mm Number of attempts: 1 Airway Equipment and Method: Stylet and Oral airway Placement Confirmation: ETT inserted through vocal cords under direct vision,  positive ETCO2 and breath sounds checked- equal and bilateral Secured at: 21 cm Tube secured with: Tape Dental Injury: Teeth and Oropharynx as per pre-operative assessment

## 2015-05-27 NOTE — Progress Notes (Signed)
Pt sitting up on side of stretcher.  He became pale in color, c/o of nausea and lightheaded,   BP 120/61,  HR 47  100% O2 sats.   No complaints of SOB or CP.   Pt placed back in supine position and is feeling better.  I will continue to monitor.  His wife and daughter are at the bedside.  Call light within reach.  IVF's are infusing.

## 2015-05-27 NOTE — Progress Notes (Signed)
[name] up to bathroom and unable to void.  He walked down hallway x2 without complaints.

## 2015-05-27 NOTE — Progress Notes (Signed)
Patient ambulated in hallway and tolerated well. Denies pain. Reports he is beginning to feel as if he could void.

## 2015-05-27 NOTE — Progress Notes (Signed)
Pt up to bathroom and unable to void.

## 2015-05-27 NOTE — Discharge Instructions (Signed)
[facility]  UMBILICAL OR INGUINAL HERNIA REPAIR: POST OP INSTRUCTIONS  Always review your discharge instruction sheet given to you by the facility where your surgery was performed. IF YOU HAVE DISABILITY OR FAMILY LEAVE FORMS, YOU MUST BRING THEM TO THE OFFICE FOR PROCESSING.   DO NOT GIVE THEM TO YOUR DOCTOR.  RESUME PLAVIX ON SATURDAY  1. A  prescription for pain medication may be given to you upon discharge.  Take your pain medication as prescribed, if needed.  If narcotic pain medicine is not needed, then you may take acetaminophen (Tylenol) or ibuprofen (Advil) as needed. 2. Take your usually prescribed medications unless otherwise directed. 3. If you need a refill on your pain medication, please contact your pharmacy.  They will contact our office to request authorization. Prescriptions will not be filled after 5 pm or on week-ends. 4. You should follow a light diet the first 24 hours after arrival home, such as soup and crackers, etc.  Be sure to include lots of fluids daily.  Resume your normal diet the day after surgery. 5. Most patients will experience some swelling and bruising around the umbilicus or in the groin and scrotum.  Ice packs and reclining will help.  Swelling and bruising can take several days to resolve.  6. It is common to experience some constipation if taking pain medication after surgery.  Increasing fluid intake and taking a stool softener (such as Colace) will usually help or prevent this problem from occurring.  A mild laxative (Milk of Magnesia or Miralax) should be taken according to package directions if there are no bowel movements after 48 hours. 7.   If your surgeon used skin glue on the incision, you may shower in 24 hours.  The glue will flake off over the next 2-3 weeks.  Any sutures or staples will be removed at the office during your follow-up visit. 8. ACTIVITIES:  You may resume regular (light) daily activities beginning the next  day--such as daily self-care, walking, climbing stairs--gradually increasing activities as tolerated.  You may have sexual intercourse when it is comfortable.  Refrain from any heavy lifting or straining until approved by your doctor. a. You may drive when you are no longer taking prescription pain medication, you can comfortably wear a seatbelt, and you can safely maneuver your car and apply brakes. b. RETURN TO WORK:  9. You should see your doctor in the office for a follow-up appointment approximately 2-3 weeks after your surgery.  Make sure that you call for this appointment within a day or two after you arrive home to insure a convenient appointment time. 10. OTHER INSTRUCTIONS: DO NOT LIFT, PUSH, PULL ANYTHING GREATER THAN 10 POUNDS FOR 6 WEEKS    WHEN TO CALL YOUR DOCTOR: 1. Fever over 101.0 2. Inability to urinate 3. Nausea and/or vomiting 4. Extreme swelling or bruising 5. Continued bleeding from incision. 6. Increased pain, redness, or drainage from the incision  The clinic staff is available to answer your questions during regular business hours.  Please dont hesitate to call and ask to speak to one of the nurses for clinical concerns.  If you have a medical emergency, go to the nearest emergency room or call 911.  A surgeon from [facility] is always on call at the hospital   [address] [place], [place], [place]  [address] ?  [address] [place], [place],    [address] [phone] ? [phone] ? FAX [phone] Web  site: [email or web address]   General Anesthesia, Adult, Care After Refer to this sheet in the next few weeks. These instructions provide you with information on caring for yourself after your procedure. Your health care provider may also give you more specific instructions. Your treatment has been planned according to current medical practices, but problems sometimes occur. Call your health care provider if you have any problems  or questions after your procedure. WHAT TO EXPECT AFTER THE PROCEDURE After the procedure, it is typical to experience:  Sleepiness.  Nausea and vomiting. HOME CARE INSTRUCTIONS  For the first 24 hours after general anesthesia:  Have a responsible person with you.  Do not drive a car. If you are alone, do not take public transportation.  Do not drink alcohol.  Do not take medicine that has not been prescribed by your health care provider.  Do not sign important papers or make important decisions.  You may resume a normal diet and activities as directed by your health care provider.  Change bandages (dressings) as directed.  If you have questions or problems that seem related to general anesthesia, call the hospital and ask for the anesthetist or anesthesiologist on call. SEEK MEDICAL CARE IF:  You have nausea and vomiting that continue the day after anesthesia.  You develop a rash. SEEK IMMEDIATE MEDICAL CARE IF:   You have difficulty breathing.  You have chest pain.  You have any allergic problems.   This information is not intended to replace advice given to you by your health care provider. Make sure you discuss any questions you have with your health care provider.   Document Released: 11/06/2000 Document Revised: 08/21/2014 Document Reviewed: 11/29/2011 Elsevier Interactive Patient Education [employer].

## 2015-05-27 NOTE — Transfer of Care (Signed)
Immediate Anesthesia Transfer of Care Note  Patient: Marcus Taylor  Procedure(s) Performed: Procedure(s): LAPAROSCOPIC REPAIR RIGHT  INGUINAL HERNIA (Right) INSERTION OF MESH (Right)  Patient Location: PACU  Anesthesia Type:General  Level of Consciousness:  sedated, patient cooperative and responds to stimulation  Airway & Oxygen Therapy:Patient Spontanous Breathing and Patient connected to face mask oxgen  Post-op Assessment:  Report given to PACU RN and Post -op Vital signs reviewed and stable  Post vital signs:  Reviewed and stable  Last Vitals:  Filed Vitals:   05/27/15 0532  BP: 132/66  Pulse: 49  Temp: 36.4 C  Resp: 16    Complications: No apparent anesthesia complicationsImmediate Anesthesia Transfer of Care Note  Patient: Marcus Taylor  Procedure(s) Performed: Procedure(s): LAPAROSCOPIC REPAIR RIGHT  INGUINAL HERNIA (Right) INSERTION OF MESH (Right)  Patient Location: PACU  Anesthesia Type:General  Level of Consciousness: awake, alert , oriented and patient cooperative  Airway & Oxygen Therapy: Patient Spontanous Breathing and Patient connected to face mask oxygen  Post-op Assessment: Report given to RN, Post -op Vital signs reviewed and stable and Patient moving all extremities X 4  Post vital signs: Reviewed and stable  Last Vitals:  Filed Vitals:   05/27/15 0532  BP: 132/66  Pulse: 49  Temp: 36.4 C  Resp: 16    Complications: No apparent anesthesia complications

## 2015-05-27 NOTE — H&P (Signed)
SCOTTY WEIGELT is an 74 y.o. male.   Chief Complaint: here for surgery HPI: 74 yo WM presents for planned lap repair of RIH with mesh. Last seen in clinic in late July. receieved cardiology clearance. Denies  Any medical changes, ED trips, hospitalizations, cp/cp/sob/n/v/f/c/d/c. Symptoms of right hernia unchanged from [place]  Past Medical History  Diagnosis Date  . Lymphocytosis   . Diabetes mellitus   . HTN (hypertension)   . Hypertriglyceridemia   . Metabolic syndrome   . Diverticulosis   . Kidney stone   . Colonic polyp   . Splenic marginal zone b-cell lymphoma ([place]) 12/18/2011  . Need for prophylactic vaccination and inoculation against influenza 09/22/2013  . Complication of anesthesia     nausea/vomiting  . PONV (postoperative nausea and vomiting)   . Coronary artery disease     stents x 3 (03-2014)  . Myocardial infarction [facility])     August 2015  . Arthritis   . Anemia     Past Surgical History  Procedure Laterality Date  . Lumbar disc surgery    . Hernia repair    . Cholecystectomy    . Cardiac catheterization      stents x 3 (03/2014)    Family History  Problem Relation Age of Onset  . Cancer Sister     kidney cancer; and then esophageal cancer  . Colon cancer Sister 19  . Cancer Brother     colon cancer; then bladder cancer  . Stomach cancer Neg Hx    Social History:  reports that he quit smoking about 26 years ago. He has never used smokeless tobacco. He reports that he does not drink alcohol or use illicit drugs.  Allergies: No Known Allergies  Medications Prior to Admission  Medication Sig Dispense Refill  . aspirin EC 81 MG tablet Take 81 mg by mouth every morning.     Marland Kitchen atorvastatin (LIPITOR) 80 MG tablet Take 80 mg by mouth every morning.     . clopidogrel (PLAVIX) 75 MG tablet Take 75 mg by mouth every morning.     . metFORMIN (GLUMETZA) 500 MG (MOD) 24 hr tablet Take 500 mg by mouth daily with breakfast.    . metoprolol tartrate (LOPRESSOR) 25 MG  tablet Take 25 mg by mouth 2 (two) times daily.    . nitroGLYCERIN (NITROSTAT) 0.4 MG SL tablet Place 0.4 mg under the tongue every 5 (five) minutes as needed for chest pain.    . tamsulosin (FLOMAX) 0.4 MG CAPS capsule Take 0.4 mg by mouth daily.    . traMADol (ULTRAM) 50 MG tablet Take 50 mg by mouth every 6 (six) hours as needed for moderate pain. Take one tablet as needed      Results for orders placed or performed during the hospital encounter of 05/27/15 (from the past 48 hour(s))  Glucose, capillary     Status: None   Collection Time: 05/27/15  5:34 AM  Result Value Ref Range   Glucose-Capillary 99 65 - 99 mg/dL   Comment 1 Notify RN    No results found.  Review of Systems  Constitutional: Negative for weight loss.  HENT: Negative for nosebleeds.   Eyes: Negative for blurred vision.  Respiratory: Negative for shortness of breath.   Cardiovascular: Negative for chest pain, palpitations, orthopnea and PND.       Denies DOE; cleared by cardiologist at [facility]  Gastrointestinal: Negative for nausea, vomiting and abdominal pain.  Genitourinary: Negative for dysuria and hematuria.       +  nocturia, hesistancy  Musculoskeletal: Negative.   Skin: Negative for itching and rash.  Neurological: Negative for dizziness, focal weakness, seizures, loss of consciousness and headaches.       Denies TIAs, amaurosis fugax  Endo/Heme/Allergies: Does not bruise/bleed easily.  Psychiatric/Behavioral: The patient is not nervous/anxious.     Blood pressure 132/66, pulse 49, temperature 97.6 F (36.4 C), temperature source Oral, resp. rate 16, height 6' (1.829 m), weight 93.895 kg (207 lb), SpO2 100 %. Physical Exam  Vitals reviewed. Constitutional: He is oriented to person, place, and time. He appears well-developed and well-nourished. No distress.  obese  HENT:  Head: Normocephalic and atraumatic.  Right Ear: External ear normal.  Left Ear: External ear normal.  Eyes: Conjunctivae are  normal. No scleral icterus.  Neck: Normal range of motion. Neck supple. No tracheal deviation present. No thyromegaly present.  Cardiovascular: Normal rate and normal heart sounds.   Respiratory: Effort normal and breath sounds normal. No stridor. No respiratory distress. He has no wheezes.  GI: Soft. He exhibits no distension. There is no tenderness. There is no rebound.  Right groin bulge; old left groin scar  Musculoskeletal: He exhibits no edema or tenderness.  Lymphadenopathy:    He has no cervical adenopathy.  Neurological: He is alert and oriented to person, place, and time. He exhibits normal muscle tone.  Skin: Skin is warm and dry. No rash noted. He is not diaphoretic. No erythema. No pallor.  Psychiatric: He has a normal mood and affect. His behavior is normal. Judgment and thought content normal.     Assessment/Plan Right inguinal hernia  Although his preop wbc was elevated he denies any symptoms of infection. His vitals are stable. Will proceed with lap right inguinal hernia repair. Has been off of plavix. Been taking flomax to try to prevent urinary retention  All questions asked and answered.   Leighton Ruff. Redmond Pulling, MD, FACS General, Bariatric, & Minimally Invasive Surgery [facility], [place]  [facility] M 05/27/2015, 7:20 AM

## 2015-05-27 NOTE — Anesthesia Preprocedure Evaluation (Signed)
Anesthesia Evaluation  Patient identified by MRN, date of birth, ID band Patient awake    Reviewed: Allergy & Precautions, NPO status , Patient's Chart, lab work & pertinent test results  Airway Mallampati: II  TM Distance: >3 FB Neck ROM: Full    Dental no notable dental hx.    Pulmonary neg pulmonary ROS, former smoker,    Pulmonary exam normal breath sounds clear to auscultation       Cardiovascular hypertension, Pt. on medications and Pt. on home beta blockers + CAD and + Past MI  Normal cardiovascular exam Rhythm:Regular Rate:Normal     Neuro/Psych negative neurological ROS  negative psych ROS   GI/Hepatic negative GI ROS, Neg liver ROS,   Endo/Other  diabetes  Renal/GU negative Renal ROS  negative genitourinary   Musculoskeletal negative musculoskeletal ROS (+)   Abdominal   Peds negative pediatric ROS (+)  Hematology negative hematology ROS (+)   Anesthesia Other Findings   Reproductive/Obstetrics negative OB ROS                             Anesthesia Physical Anesthesia Plan  ASA: III  Anesthesia Plan: General   Post-op Pain Management:    Induction: Intravenous  Airway Management Planned: Oral ETT  Additional Equipment:   Intra-op Plan:   Post-operative Plan: Extubation in OR  Informed Consent: I have reviewed the patients History and Physical, chart, labs and discussed the procedure including the risks, benefits and alternatives for the proposed anesthesia with the patient or authorized representative who has indicated his/her understanding and acceptance.   Dental advisory given  Plan Discussed with: CRNA and Surgeon  Anesthesia Plan Comments:         Anesthesia Quick Evaluation

## 2015-05-27 NOTE — Anesthesia Postprocedure Evaluation (Signed)
  Anesthesia Post-op Note  Patient: Marcus Taylor  Procedure(s) Performed: Procedure(s) (LRB): LAPAROSCOPIC REPAIR RIGHT  INGUINAL HERNIA (Right) INSERTION OF MESH (Right)  Patient Location: PACU  Anesthesia Type: General  Level of Consciousness: awake and alert   Airway and Oxygen Therapy: Patient Spontanous Breathing  Post-op Pain: mild  Post-op Assessment: Post-op Vital signs reviewed, Patient's Cardiovascular Status Stable, Respiratory Function Stable, Patent Airway and No signs of Nausea or vomiting  Last Vitals:  Filed Vitals:   05/27/15 0940  BP: 123/52  Pulse: 57  Temp: 36.4 C  Resp: 15    Post-op Vital Signs: stable   Complications: No apparent anesthesia complications

## 2015-05-27 NOTE — Op Note (Signed)
05/27/2015  Marcus Taylor [birth year]   PREOPERATIVE DIAGNOSIS: right inguinal hernia.   POSTOPERATIVE DIAGNOSIS: large right indirect inguinal hernia.   PROCEDURE: Laparoscopic repair of large right indirect inguinal hernia with  mesh (TAPP).   SURGEON: Leighton Ruff. Redmond Pulling, MD   ASSISTANT SURGEON: None.   ANESTHESIA: General plus local consisting of 0.25% Marcaine with epi.   ESTIMATED BLOOD LOSS: Minimal.   FINDINGS: The patient had a right indirect inguinal hernia.  It was repaired using a right Large Bard 3DMax mesh  SPECIMEN: cord lipoma - discarded  INDICATIONS FOR PROCEDURE: 74 yo WM presented for repair of a symptomatic right inguinal hernia.  The risks and benefits including but not limited to bleeding, infection, chronic inguinal pain, nerve entrapment, hernia recurrence, mesh complications, hematoma formation, urinary retention, injury to the testicles or the ovaries, numbness in the groin, blood clots, injury to the surrounding structures, and anesthesia risk was discussed with the patient.  DESCRIPTION OF PROCEDURE: After obtaining verbal consent and marking  the right groin in the holding area with the patient confirming the  operative site, the patient was then taken back to the operating room, placed  supine on the operating room table. General endotracheal anesthesia was  established. The patient had emptied their bladder prior to going back to  the operating room. Sequential compression devices were placed. The  abdomen and groin were prepped and draped in the usual standard surgical  fashion with ChloraPrep. The patient received IV  antibiotics prior to the incision. A surgical time-out was performed.  Local was infiltrated at the base of the umbilicus.   Next, a 1-cm vertical infraumbilical incision was made with a #11 blade. The fascia  was grasped and lifted anteriorly. Next, the fascia was incised, and  the abdominal cavity was entered. Pursestring  suture was placed around  the fascial edges using a 0 Vicryl. A 12-mm Hasson trocar was placed.  Pneumoperitoneum was smoothly established up to a patient pressure of 15  mmHg. Laparoscope was advanced. There was no evidence of a  contralateral hernia. The patient had a defect lateral to  the inferior epigastric vessel, consistent with an right indirect  Hernia. It was a rather large defect. Two 5-mm trocars were placed, one on the right, one on the left  in the midclavicular line slightly above the level of the umbilicus all  under direct visualization. After local had been infiltrated, I then  made incision along the peritoneum on the right, starting 2 inches above  the anterior superior iliac spine and caring it medial  toward the median umbilical ligament in a lazy S configuration using  Endo Shears with electrocautery. The peritoneal flap was then gently  dissected downward from the anterior abdominal wall taking care not to  injure the inferior epigastric vessels. The pubic bone was identified.  The testicular vessels were identified.  Using  traction and counter traction with short graspers, I reduced the sac in  its entirety. The testicular vessels had been identified and preserved. The vas deferens was identified and preserved, and the hernia sac was stripped from those to  surrounding structures. He had a cord lipoma that was dissected off as well from the cord structures. This lipoma was tethered a little bit to the peritoneal flap. I decided to resect this cord lipoma by placing a PDS endoloop near base and then removing with endoshears with cautery. The cord lipoma was removed and discarded.  I then went about creating a large pocket  by  lifting the peritoneum of the pelvic floor. I took great care not to  injure the iliac vessels.    Local anesthetic was injected 2 finger breadths below and medial to the anterior superior iliac spine as well as along the right groin prior to  placing the mesh. I then obtained a piece of right Large Bard 3DMax Mesh, placed it through the Hasson trocar, half of it covered medial  to the inferior epigastric vessels and half of it lateral to the  inferior epigastric vessels. The defect was well  covered with the mesh. I then secured the mesh to the abdominal wall  using an Ethicon secure strap tack. Tacks were placed through  the Cooper's ligament, one tack on each side of the inferior epigastric  vessel and two tacks out laterally. No tacks were placed below the  shelving edge of the inguinal ligament. Pneumoperitoneum was reduced  to 8 mmHg. I then brought the peritoneal flap back up to the abdominal  wall and tacked it to the abdominal wall using 4 tacks. There was no  defect in the peritoneum, and the mesh was well covered. I removed the  Hasson trocar and tied down the previously placed pursestring suture. I placed an additional interrupted 0 vicryl suture.  The closure was viewed laparoscopically. There was no evidence of  fascial defect. There was no air leak at the umbilicus. There was no  evidence of injury to surrounding structures. Pneumoperitoneum was  released, and the remaining trocars were removed. All skin incisions  were closed with a 4-0 Monocryl in a subcuticular fashion followed by  application of Dermabond. All needle, instrument, and sponge counts  were correct x2. There are no immediate complications. The patient  tolerated the procedure well. The patient was extubated and taken to the  recovery room in stable condition.  Leighton Ruff. Redmond Pulling, MD, FACS General, Bariatric, & Minimally Invasive Surgery [facility], [place]

## 2015-05-31 ENCOUNTER — Telehealth: Payer: Self-pay | Admitting: Hematology and Oncology

## 2015-05-31 ENCOUNTER — Encounter: Payer: Self-pay | Admitting: Hematology and Oncology

## 2015-05-31 ENCOUNTER — Ambulatory Visit (HOSPITAL_BASED_OUTPATIENT_CLINIC_OR_DEPARTMENT_OTHER): Payer: Medicare Other | Admitting: Hematology and Oncology

## 2015-05-31 ENCOUNTER — Other Ambulatory Visit (HOSPITAL_BASED_OUTPATIENT_CLINIC_OR_DEPARTMENT_OTHER): Payer: Medicare Other

## 2015-05-31 VITALS — BP 126/69 | HR 59 | Temp 98.3°F | Resp 18 | Ht 72.0 in | Wt 205.8 lb

## 2015-05-31 DIAGNOSIS — D630 Anemia in neoplastic disease: Secondary | ICD-10-CM

## 2015-05-31 DIAGNOSIS — Z23 Encounter for immunization: Secondary | ICD-10-CM | POA: Diagnosis not present

## 2015-05-31 DIAGNOSIS — C8307 Small cell B-cell lymphoma, spleen: Secondary | ICD-10-CM

## 2015-05-31 DIAGNOSIS — Z299 Encounter for prophylactic measures, unspecified: Secondary | ICD-10-CM

## 2015-05-31 LAB — CBC WITH DIFFERENTIAL/PLATELET
BASO%: 0.3 % (ref 0.0–2.0)
Basophils Absolute: 0 10*3/uL (ref 0.0–0.1)
EOS%: 0.8 % (ref 0.0–7.0)
Eosinophils Absolute: 0.1 10*3/uL (ref 0.0–0.5)
HCT: 35.4 % — ABNORMAL LOW (ref 38.4–49.9)
HGB: 11.3 g/dL — ABNORMAL LOW (ref 13.0–17.1)
LYMPH%: 77 % — AB (ref 14.0–49.0)
MCH: 25.9 pg — ABNORMAL LOW (ref 27.2–33.4)
MCHC: 31.9 g/dL — AB (ref 32.0–36.0)
MCV: 81.4 fL (ref 79.3–98.0)
MONO#: 0.4 10*3/uL (ref 0.1–0.9)
MONO%: 3.2 % (ref 0.0–14.0)
NEUT#: 2.5 10*3/uL (ref 1.5–6.5)
NEUT%: 18.7 % — AB (ref 39.0–75.0)
PLATELETS: 154 10*3/uL (ref 140–400)
RBC: 4.36 10*6/uL (ref 4.20–5.82)
RDW: 16.8 % — ABNORMAL HIGH (ref 11.0–14.6)
WBC: 13.1 10*3/uL — ABNORMAL HIGH (ref 4.0–10.3)
lymph#: 10.1 10*3/uL — ABNORMAL HIGH (ref 0.9–3.3)

## 2015-05-31 LAB — COMPREHENSIVE METABOLIC PANEL (CC13)
ALT: 23 U/L (ref 0–55)
ANION GAP: 7 meq/L (ref 3–11)
AST: 28 U/L (ref 5–34)
Albumin: 3.9 g/dL (ref 3.5–5.0)
Alkaline Phosphatase: 79 U/L (ref 40–150)
BUN: 18.3 mg/dL (ref 7.0–26.0)
CHLORIDE: 108 meq/L (ref 98–109)
CO2: 29 meq/L (ref 22–29)
CREATININE: 0.9 mg/dL (ref 0.7–1.3)
Calcium: 9.2 mg/dL (ref 8.4–10.4)
EGFR: 86 mL/min/{1.73_m2} — ABNORMAL LOW (ref >90)
Glucose: 99 mg/dl (ref 70–140)
Potassium: 4.7 mEq/L (ref 3.5–5.1)
Sodium: 144 mEq/L (ref 136–145)
Total Bilirubin: 0.46 mg/dL (ref 0.20–1.20)
Total Protein: 6.3 g/dL — ABNORMAL LOW (ref 6.4–8.3)

## 2015-05-31 LAB — TECHNOLOGIST REVIEW

## 2015-05-31 LAB — LACTATE DEHYDROGENASE (CC13): LDH: 140 U/L (ref 125–245)

## 2015-05-31 MED ORDER — INFLUENZA VAC SPLIT QUAD 0.5 ML IM SUSY
0.5000 mL | PREFILLED_SYRINGE | Freq: Once | INTRAMUSCULAR | Status: AC
  Administered 2015-05-31: 0.5 mL via INTRAMUSCULAR
  Filled 2015-05-31: qty 0.5

## 2015-05-31 NOTE — Telephone Encounter (Signed)
Gave adn printed appt sched and avs for pt for OCT 2017

## 2015-06-01 NOTE — Assessment & Plan Note (Signed)
This is likely anemia of chronic disease and possibly related to underlying bone marrow disease. The patient denies recent history of bleeding such as epistaxis, hematuria or hematochezia. He is asymptomatic from the anemia. We will observe for now.

## 2015-06-01 NOTE — Assessment & Plan Note (Signed)
Clinically, he has no signs of disease progression. I will see him back on a yearly basis with history, physical examination and blood work. I educated the patient signs and symptoms to watch out for disease progression.

## 2015-06-01 NOTE — Assessment & Plan Note (Signed)
We discussed the importance of preventive care and reviewed the vaccination programs. He does not have any prior allergic reactions to influenza vaccination. He agrees to proceed with influenza vaccination today and we will administer it today at the clinic.  

## 2015-06-01 NOTE — Progress Notes (Signed)
[facility] OFFICE PROGRESS NOTE  Patient Care Team: Briscoe Deutscher, MD as PCP - General (Family Medicine)  SUMMARY OF ONCOLOGIC HISTORY:  This is a very pleasant gentleman who was found to have abnormal CBC for the past 5 years. In April 2013, flow cytometry detectable monoclonal B-cell population suspicious for splenic/marginal zone lymphoma. Ultrasound confirmed splenomegaly. Due to lack of symptoms he was being observed.  INTERVAL HISTORY: Please see below for problem oriented charting. He feels well. He had recent hernia repair and recovered fully. Denies recent infection. No new lymphadenopathy.  REVIEW OF SYSTEMS:   Constitutional: Denies fevers, chills or abnormal weight loss Eyes: Denies blurriness of vision Ears, nose, mouth, throat, and face: Denies mucositis or sore throat Respiratory: Denies cough, dyspnea or wheezes Cardiovascular: Denies palpitation, chest discomfort or lower extremity swelling Gastrointestinal:  Denies nausea, heartburn or change in bowel habits Skin: Denies abnormal skin rashes Lymphatics: Denies new lymphadenopathy or easy bruising Neurological:Denies numbness, tingling or new weaknesses Behavioral/Psych: Mood is stable, no new changes  All other systems were reviewed with the patient and are negative.  I have reviewed the past medical history, past surgical history, social history and family history with the patient and they are unchanged from previous note.  ALLERGIES:  has No Known Allergies.  MEDICATIONS:  Current Outpatient Prescriptions  Medication Sig Dispense Refill  . aspirin EC 81 MG tablet Take 81 mg by mouth every morning.     Marland Kitchen atorvastatin (LIPITOR) 80 MG tablet Take 80 mg by mouth every morning.     . clopidogrel (PLAVIX) 75 MG tablet Take 75 mg by mouth every morning.     . metFORMIN (GLUMETZA) 500 MG (MOD) 24 hr tablet Take 500 mg by mouth daily with breakfast.    . metoprolol tartrate (LOPRESSOR) 25 MG tablet  Take 25 mg by mouth 2 (two) times daily.    . nitroGLYCERIN (NITROSTAT) 0.4 MG SL tablet Place 0.4 mg under the tongue every 5 (five) minutes as needed for chest pain.    . tamsulosin (FLOMAX) 0.4 MG CAPS capsule Take 0.4 mg by mouth daily.    . traMADol (ULTRAM) 50 MG tablet Take 50 mg by mouth every 6 (six) hours as needed for moderate pain. Take one tablet as needed     No current facility-administered medications for this visit.    PHYSICAL EXAMINATION: ECOG PERFORMANCE STATUS: 0 - Asymptomatic  Filed Vitals:   05/31/15 1430  BP: 126/69  Pulse: 59  Temp: 98.3 F (36.8 C)  Resp: 18   Filed Weights   05/31/15 1430  Weight: 205 lb 12.8 oz (93.35 kg)    GENERAL:alert, no distress and comfortable SKIN: skin color, texture, turgor are normal, no rashes or significant lesions EYES: normal, Conjunctiva are pink and non-injected, sclera clear OROPHARYNX:no exudate, no erythema and lips, buccal mucosa, and tongue normal  NECK: supple, thyroid normal size, non-tender, without nodularity LYMPH:  no palpable lymphadenopathy in the cervical, axillary or inguinal LUNGS: clear to auscultation and percussion with normal breathing effort HEART: regular rate & rhythm and no murmurs and no lower extremity edema ABDOMEN:abdomen soft, non-tender and normal bowel sounds Musculoskeletal:no cyanosis of digits and no clubbing  NEURO: alert & oriented x 3 with fluent speech, no focal motor/sensory deficits  LABORATORY DATA:  I have reviewed the data as listed    Component Value Date/Time   NA 144 05/31/2015 1404   NA 141 05/24/2015 0935   K 4.7 05/31/2015 1404   K  4.6 05/24/2015 0935   CL 106 05/24/2015 0935   CL 102 10/07/2012 1013   CO2 29 05/31/2015 1404   CO2 29 05/24/2015 0935   GLUCOSE 99 05/31/2015 1404   GLUCOSE 119* 05/24/2015 0935   GLUCOSE 199* 10/07/2012 1013   BUN 18.3 05/31/2015 1404   BUN 16 05/24/2015 0935   CREATININE 0.9 05/31/2015 1404   CREATININE 0.84 05/24/2015  0935   CALCIUM 9.2 05/31/2015 1404   CALCIUM 9.0 05/24/2015 0935   PROT 6.3* 05/31/2015 1404   PROT 6.4 03/12/2012 1358   ALBUMIN 3.9 05/31/2015 1404   ALBUMIN 4.6 03/12/2012 1358   AST 28 05/31/2015 1404   AST 27 03/12/2012 1358   ALT 23 05/31/2015 1404   ALT 15 03/12/2012 1358   ALKPHOS 79 05/31/2015 1404   ALKPHOS 69 03/12/2012 1358   BILITOT 0.46 05/31/2015 1404   BILITOT 0.3 03/12/2012 1358   GFRNONAA >60 05/24/2015 0935   GFRAA >60 05/24/2015 0935    No results found for: SPEP, UPEP  Lab Results  Component Value Date   WBC 13.1* 05/31/2015   NEUTROABS 2.5 05/31/2015   HGB 11.3* 05/31/2015   HCT 35.4* 05/31/2015   MCV 81.4 05/31/2015   PLT 154 05/31/2015      Chemistry      Component Value Date/Time   NA 144 05/31/2015 1404   NA 141 05/24/2015 0935   K 4.7 05/31/2015 1404   K 4.6 05/24/2015 0935   CL 106 05/24/2015 0935   CL 102 10/07/2012 1013   CO2 29 05/31/2015 1404   CO2 29 05/24/2015 0935   BUN 18.3 05/31/2015 1404   BUN 16 05/24/2015 0935   CREATININE 0.9 05/31/2015 1404   CREATININE 0.84 05/24/2015 0935      Component Value Date/Time   CALCIUM 9.2 05/31/2015 1404   CALCIUM 9.0 05/24/2015 0935   ALKPHOS 79 05/31/2015 1404   ALKPHOS 69 03/12/2012 1358   AST 28 05/31/2015 1404   AST 27 03/12/2012 1358   ALT 23 05/31/2015 1404   ALT 15 03/12/2012 1358   BILITOT 0.46 05/31/2015 1404   BILITOT 0.3 03/12/2012 1358      ASSESSMENT & PLAN:  Splenic marginal zone b-cell lymphoma Clinically, he has no signs of disease progression. I will see him back on a yearly basis with history, physical examination and blood work. I educated the patient signs and symptoms to watch out for disease progression.    Preventive measure We discussed the importance of preventive care and reviewed the vaccination programs. He does not have any prior allergic reactions to influenza vaccination. He agrees to proceed with influenza vaccination today and we will  administer it today at the clinic.     Anemia in neoplastic disease This is likely anemia of chronic disease and possibly related to underlying bone marrow disease. The patient denies recent history of bleeding such as epistaxis, hematuria or hematochezia. He is asymptomatic from the anemia. We will observe for now.         Orders Placed This Encounter  Procedures  . CBC with Differential/Platelet    Standing Status: Future     Number of Occurrences:      Standing Expiration Date: 07/04/2016  . Comprehensive metabolic panel    Standing Status: Future     Number of Occurrences:      Standing Expiration Date: 07/04/2016  . Lactate dehydrogenase    Standing Status: Future     Number of Occurrences:  Standing Expiration Date: 07/04/2016   All questions were answered. The patient knows to call the clinic with any problems, questions or concerns. No barriers to learning was detected. I spent 15 minutes counseling the patient face to face. The total time spent in the appointment was 20 minutes and more than 50% was on counseling and review of test results     [facility], [place], MD 06/01/2015 4:30 PM

## 2015-10-29 LAB — HEMOGLOBIN A1C: Hemoglobin A1C: 6.3

## 2016-01-13 DIAGNOSIS — I4510 Unspecified right bundle-branch block: Secondary | ICD-10-CM

## 2016-01-13 HISTORY — DX: Unspecified right bundle-branch block: I45.10

## 2016-01-24 ENCOUNTER — Encounter: Payer: Self-pay | Admitting: Family Medicine

## 2016-01-24 ENCOUNTER — Ambulatory Visit (INDEPENDENT_AMBULATORY_CARE_PROVIDER_SITE_OTHER): Payer: Medicare Other | Admitting: Family Medicine

## 2016-01-24 VITALS — BP 136/76 | HR 71 | Temp 97.9°F | Resp 20 | Ht 70.0 in | Wt 199.2 lb

## 2016-01-24 DIAGNOSIS — C8307 Small cell B-cell lymphoma, spleen: Secondary | ICD-10-CM | POA: Diagnosis not present

## 2016-01-24 DIAGNOSIS — D630 Anemia in neoplastic disease: Secondary | ICD-10-CM | POA: Diagnosis not present

## 2016-01-24 DIAGNOSIS — Z7689 Persons encountering health services in other specified circumstances: Secondary | ICD-10-CM

## 2016-01-24 DIAGNOSIS — R7303 Prediabetes: Secondary | ICD-10-CM | POA: Diagnosis not present

## 2016-01-24 DIAGNOSIS — Z7189 Other specified counseling: Secondary | ICD-10-CM | POA: Diagnosis not present

## 2016-01-24 DIAGNOSIS — I10 Essential (primary) hypertension: Secondary | ICD-10-CM

## 2016-01-24 NOTE — Progress Notes (Signed)
Patient ID: Marcus Taylor, male   DOB: 11-Nov-1940, 75 y.o.   MRN: [id number]      Patient ID: Marcus Taylor, male  DOB: [birth year], 75 y.o.   MRN: [id number]  Subjective:  Marcus Taylor is a 75 y.o. male present for establishment of care.  All past medical history, surgical history, allergies, family history, immunizations, medications and social history were obtained and entered in the electronic medical record today, along with additional records request that will be reviewed and entered at the time it is received.  All recent labs, ED visits and hospitalizations within the last year were reviewed.  Patient Care Team    Relationship Specialty Notifications Start End  [name], DO PCP - General Family Medicine  01/26/16   [name], MD Consulting Physician Gastroenterology  01/26/16   [name], MD Consulting Physician Hematology and Oncology  01/26/16   [name], MD Referring Physician Cardiology  01/26/16     Hypertension/H/o MI/CAD/stentsx3: Pt routinely follows with cardiology at [facility], Dr. [name]. Pt has had stent placement and MI in 2015. He is prescribed Lipitor 80 mg, plavix, lisinopril 20 mg, lopressor 25 (BID). He takes a daily baby asa. He is prescribed nitro, but reports never having to use. He denies chest pain, shortness of breath,  Orthopnea, DOE or LE edema.    Splenic marginal zone b-cell lymphoma- Follows yearly with oncology. No medications or interventions to date.   Diabetes type 2: metformin 500 mg daily. Although a diagnosis of Diabetes, it sounds medication started for more metabolic syndrome. Will await records from prior PCP to decide on follow up need. No records or labs available at time of visit.   Health maintenance:  Colonoscopy: 2013 completed, 5 year f/u for Fhx. Diverticula noted only.  Immunizations: tdap 2015, PNA series completed. Zostavax unknown.  Infectious disease screening: Not indicated.  PSA: Unknown screening. Awaiting  records.  Assistive device: None  Oxygen use: None  Patient has a Dental home. Hospitalizations/ED visits: none   Past Medical History  Diagnosis Date  . Lymphocytosis   . Diabetes mellitus   . HTN (hypertension)   . Hypertriglyceridemia   . Diverticulosis   . Kidney stone   . Colonic polyp   . Splenic marginal zone b-cell lymphoma ([place]) 12/18/2011  . PONV (postoperative nausea and vomiting)   . Coronary artery disease     stents x 3 (03-2014)  . Myocardial infarction [facility])     August 2015  . Arthritis   . Anemia    No Known Allergies Past Surgical History  Procedure Laterality Date  . Lumbar disc surgery    . Hernia repair    . Cholecystectomy    . Cardiac catheterization      stents x 3 (03/2014)  . Inguinal hernia repair Right 05/27/2015    Procedure: LAPAROSCOPIC REPAIR RIGHT  INGUINAL HERNIA;  Surgeon: Greer Pickerel, MD;  Location: [facility];  Service: General;  Laterality: Right;  . Insertion of mesh Right 05/27/2015    Procedure: INSERTION OF MESH;  Surgeon: Greer Pickerel, MD;  Location: [facility];  Service: General;  Laterality: Right;  . Tonsillectomy and adenoidectomy     Family History  Problem Relation Age of Onset  . Kidney disease Sister   . Colon cancer Sister 71  . Colon cancer Brother   . Bladder Cancer Brother   . Arthritis Mother   . Arthritis Son   . Breast cancer Mother   . Heart  disease Mother   . Esophageal cancer Sister    Social History   Social History  . Marital Status: Married    Spouse Name: N/A  . Number of Children: 2  . Years of Education: N/A   Occupational History  .      used to work as an Proofreader; now retired   Social History Main Topics  . Smoking status: Former Smoker -- 1.00 packs/day for 20 years    Quit date: 08/14/1988  . Smokeless tobacco: Never Used  . Alcohol Use: No  . Drug Use: No  . Sexual Activity: Yes    Birth Control/ Protection: None   Other Topics Concern  . Not on file   Social History Narrative     Married to [place].    HS education. Retired.    Former smoker. No drugs or ETOH.   Wears seatbelt.   Wears dentures.   Smoke detector in the home. Firearms  In the home.    Feels safe in his relationships.    ROS: Negative, with the exception of above mentioned in HPI  Objective: BP 136/76 mmHg  Pulse 71  Temp(Src) 97.9 F (36.6 C)  Resp 20  Ht 5\' 10"  (1.778 m)  Wt 199 lb 4 oz (90.379 kg)  BMI 28.59 kg/m2  SpO2 97% Gen: Afebrile. No acute distress. Nontoxic in appearance, well-developed, well-nourished, male, pleasant caucasian.  HENT: AT. [place]. Bilateral TM visualized and normal in appearance, normal external auditory canal. MMM, no oral lesions, dentures.  Eyes:Pupils Equal Round Reactive to light, Extraocular movements intact,  Conjunctiva without redness, discharge or icterus. Neck/lymp/endocrine: Supple,no  lymphadenopathy CV: RRR, no edema Chest: CTAB, no wheeze, rhonchi or crackles.  Abd: Soft. NTND. BS present Skin: Warm and well-perfused. Skin intact. Neuro/Msk: Normal gait. PERLA. EOMi. Alert. Oriented x3.   Psych: Normal affect, dress and demeanor. Normal speech. Normal thought content and judgment.   Assessment/plan: Marcus Taylor is a 75 y.o. male present for establishment of care.  Splenic marginal zone b-cell lymphoma (HCC) Anemia in neoplastic disease Prediabetes Essential hypertension Establishing care with new doctor, encounter for - pt transferring care from [facility], Dr. Maceo Pro. No records provided prior to appt.  - Would advised pt to schedule visit in 1- 3 month depending on last visit with prior PCP. - Continue routine scheduled follow up with Oncology/cardiology and Gastroenterology. - Would advise  q 3 mos f/u initially and if labs/chronic medical conditions stable could be every 6 month f/u as long as continues to routinely follow with specialist.  - Medicare wellness yearly.   Greater than 45 minutes was spent with patient, greater than 50% of  that time was spent face-to-face with patient counseling and coordinating care.   Electronically signed by: Howard Pouch, DO [place]

## 2016-01-24 NOTE — Patient Instructions (Signed)
It was a pleasure meeting you today. Once I get your records I will call you to discuss time of labs and follow up. We will plan every 6 months followup for now. Sooner if you need anything.

## 2016-01-26 ENCOUNTER — Encounter: Payer: Self-pay | Admitting: Family Medicine

## 2016-01-26 ENCOUNTER — Telehealth: Payer: Self-pay | Admitting: Family Medicine

## 2016-01-26 DIAGNOSIS — R7303 Prediabetes: Secondary | ICD-10-CM | POA: Insufficient documentation

## 2016-01-26 NOTE — Telephone Encounter (Signed)
Please call pt: - I have received his last OV records from [facility]. He will be due after 05/01/2016 for DM/HTN follow up to be consistent with the timeline of every 6 months that he has been on. I believe he scheduled out in December because we were waiting on records to see when he needed to be scheduled.  - please call him and reschedule him to late September.  thanks

## 2016-01-27 ENCOUNTER — Encounter: Payer: Self-pay | Admitting: Family Medicine

## 2016-01-27 DIAGNOSIS — R809 Proteinuria, unspecified: Secondary | ICD-10-CM

## 2016-01-27 DIAGNOSIS — I252 Old myocardial infarction: Secondary | ICD-10-CM | POA: Insufficient documentation

## 2016-01-27 DIAGNOSIS — E781 Pure hyperglyceridemia: Secondary | ICD-10-CM | POA: Insufficient documentation

## 2016-01-27 DIAGNOSIS — Z860100 Personal history of colon polyps, unspecified: Secondary | ICD-10-CM

## 2016-01-27 DIAGNOSIS — E1129 Type 2 diabetes mellitus with other diabetic kidney complication: Secondary | ICD-10-CM | POA: Insufficient documentation

## 2016-01-27 DIAGNOSIS — E785 Hyperlipidemia, unspecified: Secondary | ICD-10-CM | POA: Insufficient documentation

## 2016-01-27 DIAGNOSIS — I2510 Atherosclerotic heart disease of native coronary artery without angina pectoris: Secondary | ICD-10-CM | POA: Insufficient documentation

## 2016-01-27 HISTORY — DX: Personal history of colonic polyps: Z86.010

## 2016-01-27 HISTORY — DX: Personal history of colon polyps, unspecified: Z86.0100

## 2016-01-28 ENCOUNTER — Other Ambulatory Visit: Payer: Self-pay | Admitting: Hematology and Oncology

## 2016-02-01 NOTE — Telephone Encounter (Signed)
Patient has been rescheduled.

## 2016-02-03 ENCOUNTER — Encounter: Payer: Self-pay | Admitting: Family Medicine

## 2016-02-03 DIAGNOSIS — K4090 Unilateral inguinal hernia, without obstruction or gangrene, not specified as recurrent: Secondary | ICD-10-CM | POA: Insufficient documentation

## 2016-02-03 HISTORY — DX: Unilateral inguinal hernia, without obstruction or gangrene, not specified as recurrent: K40.90

## 2016-02-14 ENCOUNTER — Other Ambulatory Visit: Payer: Self-pay | Admitting: *Deleted

## 2016-02-14 MED ORDER — CLOPIDOGREL BISULFATE 75 MG PO TABS: 75.0000 mg | ORAL_TABLET | Freq: Every day | ORAL | Status: DC

## 2016-02-14 NOTE — Telephone Encounter (Signed)
Pharmacy request for plavix received. We have not filled this before . Dr Alvy Bimler has notation on prior Rx for patient to get from PCP. Patient seen as new patient on 01/24/16. Please advise.

## 2016-03-03 ENCOUNTER — Encounter: Payer: Self-pay | Admitting: Family Medicine

## 2016-04-19 DIAGNOSIS — E119 Type 2 diabetes mellitus without complications: Secondary | ICD-10-CM | POA: Diagnosis not present

## 2016-04-19 DIAGNOSIS — H2513 Age-related nuclear cataract, bilateral: Secondary | ICD-10-CM | POA: Diagnosis not present

## 2016-04-19 DIAGNOSIS — Z7984 Long term (current) use of oral hypoglycemic drugs: Secondary | ICD-10-CM | POA: Diagnosis not present

## 2016-04-19 LAB — HM DIABETES EYE EXAM

## 2016-05-03 ENCOUNTER — Ambulatory Visit (INDEPENDENT_AMBULATORY_CARE_PROVIDER_SITE_OTHER): Payer: Medicare Other | Admitting: Family Medicine

## 2016-05-03 ENCOUNTER — Telehealth: Payer: Self-pay | Admitting: Family Medicine

## 2016-05-03 ENCOUNTER — Encounter: Payer: Self-pay | Admitting: Family Medicine

## 2016-05-03 VITALS — BP 127/74 | HR 57 | Temp 97.6°F | Resp 18 | Ht 70.0 in | Wt 198.2 lb

## 2016-05-03 DIAGNOSIS — I2510 Atherosclerotic heart disease of native coronary artery without angina pectoris: Secondary | ICD-10-CM

## 2016-05-03 DIAGNOSIS — I252 Old myocardial infarction: Secondary | ICD-10-CM

## 2016-05-03 DIAGNOSIS — L608 Other nail disorders: Secondary | ICD-10-CM

## 2016-05-03 DIAGNOSIS — R809 Proteinuria, unspecified: Secondary | ICD-10-CM

## 2016-05-03 DIAGNOSIS — E118 Type 2 diabetes mellitus with unspecified complications: Secondary | ICD-10-CM | POA: Insufficient documentation

## 2016-05-03 DIAGNOSIS — I10 Essential (primary) hypertension: Secondary | ICD-10-CM

## 2016-05-03 DIAGNOSIS — D630 Anemia in neoplastic disease: Secondary | ICD-10-CM

## 2016-05-03 DIAGNOSIS — Z23 Encounter for immunization: Secondary | ICD-10-CM

## 2016-05-03 DIAGNOSIS — E1129 Type 2 diabetes mellitus with other diabetic kidney complication: Secondary | ICD-10-CM

## 2016-05-03 DIAGNOSIS — L609 Nail disorder, unspecified: Secondary | ICD-10-CM

## 2016-05-03 DIAGNOSIS — C8307 Small cell B-cell lymphoma, spleen: Secondary | ICD-10-CM | POA: Diagnosis not present

## 2016-05-03 DIAGNOSIS — E1149 Type 2 diabetes mellitus with other diabetic neurological complication: Secondary | ICD-10-CM

## 2016-05-03 LAB — COMPREHENSIVE METABOLIC PANEL
ALBUMIN: 4.1 g/dL (ref 3.5–5.2)
ALT: 18 U/L (ref 0–53)
AST: 30 U/L (ref 0–37)
Alkaline Phosphatase: 69 U/L (ref 39–117)
BILIRUBIN TOTAL: 0.4 mg/dL (ref 0.2–1.2)
BUN: 17 mg/dL (ref 6–23)
CALCIUM: 8.9 mg/dL (ref 8.4–10.5)
CO2: 29 meq/L (ref 19–32)
CREATININE: 0.85 mg/dL (ref 0.40–1.50)
Chloride: 106 mEq/L (ref 96–112)
GFR: 93.42 mL/min (ref >60.00)
Glucose, Bld: 103 mg/dL — ABNORMAL HIGH (ref 70–99)
Potassium: 4.3 mEq/L (ref 3.5–5.1)
Sodium: 141 mEq/L (ref 135–145)
Total Protein: 6.1 g/dL (ref 6.0–8.3)

## 2016-05-03 LAB — POCT GLYCOSYLATED HEMOGLOBIN (HGB A1C): Hemoglobin A1C: 6

## 2016-05-03 LAB — CBC WITH DIFFERENTIAL/PLATELET
BASOS ABS: 0 10*3/uL (ref 0.0–0.1)
Basophils Relative: 0.3 % (ref 0.0–3.0)
EOS ABS: 0.1 10*3/uL (ref 0.0–0.7)
Eosinophils Relative: 0.7 % (ref 0.0–5.0)
HEMATOCRIT: 31.5 % — AB (ref 39.0–52.0)
HEMOGLOBIN: 10.1 g/dL — AB (ref 13.0–17.0)
LYMPHS PCT: 76.1 % — AB (ref 12.0–46.0)
Lymphs Abs: 10.8 10*3/uL — ABNORMAL HIGH (ref 0.7–4.0)
MCHC: 32 g/dL (ref 30.0–36.0)
MCV: 75.1 fl — ABNORMAL LOW (ref 78.0–100.0)
Monocytes Absolute: 0.7 10*3/uL (ref 0.1–1.0)
Monocytes Relative: 4.6 % (ref 3.0–12.0)
Neutro Abs: 2.6 10*3/uL (ref 1.4–7.7)
Neutrophils Relative %: 18.3 % — ABNORMAL LOW (ref 43.0–77.0)
Platelets: 183 10*3/uL (ref 150.0–400.0)
RBC: 4.19 Mil/uL — ABNORMAL LOW (ref 4.22–5.81)
RDW: 19 % — ABNORMAL HIGH (ref 11.5–15.5)
WBC: 14.1 10*3/uL — AB (ref 4.0–10.5)

## 2016-05-03 LAB — MICROALBUMIN / CREATININE URINE RATIO
CREATININE, U: 116.7 mg/dL
MICROALB UR: 6.8 mg/dL — AB (ref 0.0–1.9)
Microalb Creat Ratio: 5.8 mg/g (ref 0.0–30.0)

## 2016-05-03 NOTE — Telephone Encounter (Signed)
Please call pt: - His hemoglobin (anemia) is mildly lower than prior, otherwise his labs are consistent with prior collections.  - I have forwarded the results to his oncologist.

## 2016-05-03 NOTE — Progress Notes (Signed)
Patient ID: Marcus Taylor, male   DOB: 10/11/1940, 75 y.o.   MRN: [id number]      Patient ID: Marcus Taylor, male  DOB: 29-Dec-1940, 75 y.o.   MRN: [id number]  Subjective:  Marcus Taylor is a 75 y.o. male present for follow up on chronic medical issues.  All past medical history, surgical history, allergies, family history, immunizations, medications and social history were updated in the electronic medical record today. Additional records requested from his opthalmology office  Patient Care Team    Relationship Specialty Notifications Start End  [name], DO PCP - General Family Medicine  01/26/16   [name], MD Consulting Physician Gastroenterology  01/26/16   [name], MD Consulting Physician Hematology and Oncology  01/26/16   [name], MD Referring Physician Cardiology  01/26/16   [name], MD Consulting Physician Neurosurgery  01/27/16   [name], OD Referring Physician   05/03/16     Hypertension/H/o MI/CAD/stentsx3: Pt routinely follows with cardiology at [facility], Dr. [name]. Pt has had stent placement and MI in 2015. He is prescribed Lipitor 80 mg, plavix, lisinopril 20 mg, lopressor 25 (BID). He takes a daily baby asa. He is prescribed nitro, but reports never having to use. He denies chest pain, shortness of breath, orthopnea, DOE or LE edema.  Pt recent echo results from 11/2015 reviewed.   Splenic marginal zone b-cell lymphoma- Follows yearly with oncology, appointment scheduled in October 2017. No medications or interventions to date. Last CBC 05/2015 mild anemia and lymphocytosis 77%.  Diabetes type 2: Pt is compliant with metformin 500 mg daily. He does watch his diet closely, but does not get much exercise. His right knee condition makes it too painful to exercise routinely. Denies hypoglycemic events/hyperglycemic events. He does endorse mild tingling sensation of his bilateral large toes. Denies nonhealing wounds.  - Opthalmology:  04/14/2016.  [facility] Dr. [name] - pna series UTD/completed  - flu shot due and given  today - cmp every 6 mos,  05/03/2016 - foot exam completed 05/03/2016 - urine micro 05/03/2016, prior elevated microalbuminuria.   Health maintenance: Flu shot due today.   CBC Latest Ref Rng & Units 05/31/2015 05/24/2015 05/25/2014  WBC 4.0 - 10.3 10e3/uL 13.1(H) 15.7(H) 11.9(H)  Hemoglobin 13.0 - 17.1 g/dL 11.3(L) 11.4(L) 12.0(L)  Hematocrit 38.4 - 49.9 % 35.4(L) 37.0(L) 38.1(L)  Platelets 140 - 400 10e3/uL 154 157 160      Name: Marcus Taylor Study Date: 11/19/2015 Height: 72 in  MRN: [id number] Weight: 197 lb  DOB: 01-03-1941 Gender: Male BSA: 2.1 m2  Age: 69 yrsEthnicity: Caucasian BP: 146/38 mmHg  Reason For Study: Ventricular arrhythmia    Ordering Physician: [id number] [name], [name]  Performed By: [name]  Referring Physician: [name]  -  HISTORY  Arrhythmia.  -    PROCEDURE  Study Quality: Fair.  -  SUMMARY  The left ventricular size is normal.    Left ventricular systolic function is normal.  LV ejection fraction = 60-65%.    Left ventricular filling pattern is impaired.  The right ventricle is mildly dilated.  The right ventricular systolic function is normal.  The left atrium is mildly dilated.  The right atrium is mildly dilated.  There is aortic valve sclerosis.  There is trace aortic regurgitation.  No significant stenosis seen  There is trivial pericardial effusion.  -  FINDINGS:    LEFT VENTRICLE  The left ventricular size is normal. There is normal left ventricular  wall   thickness. LV ejection fraction = 60-65%. Left ventricular systolic function   is normal. Left ventricular filling pattern is impaired. No segmental wall   motion abnormalities seen in the left ventricle.  -    RIGHT VENTRICLE  The right ventricle is mildly dilated. The right ventricular systolic   function is normal.    LEFT  ATRIUM  The left atrium is mildly dilated.    RIGHT ATRIUM  The right atrium is mildly dilated.  -  AORTIC VALVE  There is aortic valve sclerosis. There is trace aortic regurgitation.  -  MITRAL VALVE  The mitral valve is normal in structure and function. There is trace mitral   regurgitation.  -  TRICUSPID VALVE  Structurally normal tricuspid valve. There is trace tricuspid regurgitation.   Estimated right ventricular systolic pressure is 31 mmHg. Estimated right   atrial pressure is 5 mmHg..  -  PULMONIC VALVE  The pulmonic valve is not well visualized. Trace pulmonic valvular   regurgitation.  -  ARTERIES  The aortic root is normal size.  -  VENOUS  Pulmonary venous flow pattern is normal. IVC size was normal.  -  EFFUSION  There is trivial pericardial effusion.  -  -  MMode/2D Measurements & Calculations  IVSd: 1.3 cm  LVIDd: 4.5 cm  LVPWd: 1.1 cm  LVIDs: 3.2 cm  LA diam: 4.3 cm  Ao root: 3.3 cm  EDV(MOD-sp4): 142.8 ml  ESV(MOD-sp4): 41.1 ml  LVOT diam: 2.1 cm  LVOT area: 3.4 cm2  SV(MOD-sp4): 101.7 ml  SI(MOD-sp4): 48.1 ml/m2  LA area A4: 27.7 cm2  LA length (vol): 5.6 cm  RA area A4: 29.1 cm2  Doppler Measurements & Calculations  MV E max vel: 57.2 cm/sec  MV A max vel: 39.4 cm/sec  MV E/A: 1.5  Med Peak E' Vel: 6.0 cm/sec  Lat Peak E' Vel: 7.8 cm/sec  E/Lat E`: 7.3  E/Med E`: 9.5  MV dec time: 0.23 sec  SV(LVOT): 59.9 ml  Ao max PG: 5.7 mmHg  Ao mean PG: 2.6 mmHg  Ao V2 VTI: 25.1 cm  AVA (VTI): 2.4 cm2  LV V1 VTI: 17.9 cm  TR max vel: 256.4 cm/sec  TR max PG: 26.3 mmHg  AS Dimensionless Index (VTI): 0.71  AVAi(VTI) cm^2/m^2: 1.1 cm2  SV index(LVOT): 28.3 ml/m2      Reading Physician:  [name], [id number] 11/19/2015 12:13 PM      Past Medical History:  Diagnosis Date  . Anemia   . Arthritis   . Colonic polyp   . Coronary artery disease    stents x 3  (03-2014)  . Diabetes mellitus   . Diverticulosis   . HTN (hypertension)   . Hypertriglyceridemia   . Kidney stone   . Lymphocytosis   . Metatarsal fracture left   left 5th proximal phalanx   . OSA (obstructive sleep apnea)    "Mild" by records  . PONV (postoperative nausea and vomiting)   . RBBB 01/2016  . Splenic marginal zone b-cell lymphoma ([place]) 12/18/2011  . STEMI (ST elevation myocardial infarction) [facility])    August 2015   No Known Allergies Past Surgical History:  Procedure Laterality Date  . CARDIAC CATHETERIZATION     stents x 3 (03/2014)  . CHOLECYSTECTOMY    . HERNIA REPAIR    . INGUINAL HERNIA REPAIR Right 05/27/2015   Procedure: LAPAROSCOPIC REPAIR RIGHT  INGUINAL HERNIA;  Surgeon: Greer Pickerel, MD;  Location: [facility] ORS;  Service:  General;  Laterality: Right;  . INSERTION OF MESH Right 05/27/2015   Procedure: INSERTION OF MESH;  Surgeon: Greer Pickerel, MD;  Location: [facility];  Service: General;  Laterality: Right;  . LUMBAR DISC SURGERY    . TONSILLECTOMY AND ADENOIDECTOMY     Family History  Problem Relation Age of Onset  . Kidney disease Sister   . Colon cancer Sister 41  . Colon cancer Brother   . Arthritis Mother   . Breast cancer Mother   . Heart disease Mother   . Bladder Cancer Brother   . Arthritis Son   . Esophageal cancer Sister    Social History   Social History  . Marital status: Married    Spouse name: N/A  . Number of children: 2  . Years of education: N/A   Occupational History  .      used to work as an Proofreader; now retired   Social History Main Topics  . Smoking status: Former Smoker    Packs/day: 1.00    Years: 20.00    Quit date: 08/14/1988  . Smokeless tobacco: Never Used  . Alcohol use No  . Drug use: No  . Sexual activity: Yes    Birth control/ protection: None   Other Topics Concern  . Not on file   Social History Narrative   Married to [place].    HS education. Retired.    Former smoker. No drugs or ETOH.   Wears  seatbelt.   Wears dentures.   Smoke detector in the home. Firearms  In the home.    Feels safe in his relationships.    ROS: Negative, with the exception of above mentioned in HPI  Objective: BP 127/74 (BP Location: Right Arm, Patient Position: Sitting, Cuff Size: Large)   Pulse (!) 57   Temp 97.6 F (36.4 C)   Resp 18   Ht '5\' 10"'  (1.778 m)   Wt 198 lb 4 oz (89.9 kg)   SpO2 96%   BMI 28.45 kg/m  Gen: Afebrile. No acute distress. Nontoxic in appearance, well-developed, well-nourished, male, pleasant caucasian.  HENT: AT. [place]. MMM, no oral lesions, dentures.  Eyes:Pupils Equal Round Reactive to light, Extraocular movements intact,  Conjunctiva without redness, discharge or icterus. Neck/lymp/endocrine: Supple,no  lymphadenopathy CV: RRR, no edema Chest: CTAB, no wheeze, rhonchi or crackles.  Abd: Soft. NTND. BS present Skin: Warm and well-perfused. Skin intact. Neuro/Msk: Normal gait. PERLA. EOMi. Alert. Oriented x3.    Diabetic Foot Exam - Simple   Simple Foot Form Diabetic Foot exam was performed with the following findings:  Yes 05/03/2016  8:21 AM  Visual Inspection See comments:  Yes Sensation Testing See comments:  Yes Pulse Check Posterior Tibialis and Dorsalis pulse intact bilaterally:  Yes Comments Thickened deformed nails affecting all nails. Decreased sensation bilateral medial large toes.      Assessment/plan: Marcus Taylor is a 75 y.o. male present for chronic medical condition follow up.  Microalbuminuria due to type 2 diabetes mellitus (HCC)/Controlled type 2 diabetes mellitus with complication, without long-term current use of insulin ([place]) - Opthalmology:  04/14/2016. [place] Dr. [name], records requested - pna series UTD/completed  - flu shot due and given  today - cmp every 6 mos,  05/03/2016 - foot exam completed 05/03/2016--> nail deformity and mild neuropathy present--> - Ambulatory referral to Podiatry. Diabetic foot care discussed,  - urine  micro 05/03/2016, prior elevated microalbuminuria, pt on ACEi, control RF: HTN, DM - last a1c 6.3--> today  6.0, would not want a1c lower than 6.0. If continues to decrease will consider DC metformin.   Essential hypertension/Coronary artery disease involving native coronary artery of native heart without angina pectoris/History of ST elevation myocardial infarction (STEMI)/Other diabetic neurological complication associated with type 2 diabetes mellitus ([place]) - stable. Follows routinely with cardiology.  - Continue ASA, plavix, lopressor, lisinopril and statin. - Low sodium diet encouraged. - recent echo reviewed.  - CBC w/Diff - Comp Met (CMET)   Anemia in neoplastic disease/Splenic marginal zone b-cell lymphoma (HCC) - CBC w/Diff - Follows yearly with Oncology.   Influenza vaccine administered - Flu vaccine HIGH DOSE PF (Fluzone High dose)  Nail deformity with Diabetes - Ambulatory referral to Podiatry  Return in about 3 months (around 08/02/2016).    Electronically signed by: Howard Pouch, DO [place]

## 2016-05-03 NOTE — Patient Instructions (Signed)
Medications will remain the same today. If you need refills please have your pharmacy request.  We will call you with lab results once they are available.  I will place a referral to podiatry to have your diabetic foot care.   Follow up 3-4 months for chronic medical conditions.

## 2016-05-04 ENCOUNTER — Other Ambulatory Visit: Payer: Self-pay | Admitting: Hematology and Oncology

## 2016-05-04 DIAGNOSIS — D539 Nutritional anemia, unspecified: Secondary | ICD-10-CM | POA: Insufficient documentation

## 2016-05-04 DIAGNOSIS — C8307 Small cell B-cell lymphoma, spleen: Secondary | ICD-10-CM

## 2016-05-04 NOTE — Telephone Encounter (Signed)
Spoke with patient reviewed lab results. 

## 2016-05-11 ENCOUNTER — Telehealth: Payer: Self-pay | Admitting: Family Medicine

## 2016-05-11 MED ORDER — METOPROLOL TARTRATE 25 MG PO TABS: 25.0000 mg | ORAL_TABLET | Freq: Two times a day (BID) | ORAL | 3 refills | Status: DC

## 2016-05-11 NOTE — Telephone Encounter (Signed)
Refill sent.

## 2016-05-11 NOTE — Telephone Encounter (Signed)
Pt's wife walked in to office to request refill of metoprolol tartrate (LOPRESSOR) 25 MG tablet to be sent to  [id number] - [place], [place] - [address] [phone] (Phone) [phone] (Fax)   Per wife patient has been out of medication for the last 3-4 days.  Please send to pharmacy as soon as you can.

## 2016-05-15 ENCOUNTER — Ambulatory Visit (INDEPENDENT_AMBULATORY_CARE_PROVIDER_SITE_OTHER): Payer: Medicare Other | Admitting: Podiatry

## 2016-05-15 ENCOUNTER — Encounter: Payer: Self-pay | Admitting: Podiatry

## 2016-05-15 DIAGNOSIS — M79675 Pain in left toe(s): Secondary | ICD-10-CM

## 2016-05-15 DIAGNOSIS — B351 Tinea unguium: Secondary | ICD-10-CM

## 2016-05-15 DIAGNOSIS — M79674 Pain in right toe(s): Secondary | ICD-10-CM

## 2016-05-15 NOTE — Progress Notes (Signed)
   Subjective:    Patient ID: Marcus Taylor, male    DOB: 05/29/1941, 75 y.o.   MRN: [id number]  HPI   75 year old male presents the also concerns of thick, painful, elongated toenails that he cannot trim himself. Denies any redness or drainage. Denies any claudication symptoms. Occasionally has a numbness and tingling as to his toes but denies any burning pain. No other complaints this time.  Last A1c 6  Review of Systems  All other systems reviewed and are negative.      Objective:   Physical Exam General: AAO x3, NAD  Dermatological: Nails are hypertrophic, dystrophic, brittle, discolored, elongated 10. No surrounding redness or drainage. Tenderness nails 1-5 bilaterally. No open lesions or pre-ulcerative lesions are identified today.  Vascular: Dorsalis Pedis artery and Posterior Tibial artery pedal pulses are 2/4 bilateral with immedate capillary fill time. There is no pain with calf compression, swelling, warmth, erythema.   Neruologic: Grossly intact via light touch bilateral. Vibratory intact via tuning fork bilateral. Protective threshold with Semmes Wienstein monofilament intact to all pedal sites bilateral.   Musculoskeletal: No gross boney pedal deformities bilateral. No pain, crepitus, or limitation noted with foot and ankle range of motion bilateral. Muscular strength 5/5 in all groups tested bilateral.  Gait: Unassisted, Nonantalgic.      Assessment & Plan:  75 year old male with symptomatic onychomycosis, early neuropathy symptoms. -Treatment options discussed including all alternatives, risks, and complications -Etiology of symptoms were discussed -Nails debrided 10 without complications or bleeding. -Continue monitor for any worsening neuropathy symptoms. We will medications for now. -Daily foot inspection -Follow-up in 3 months or sooner if any problems arise. In the meantime, encouraged to call the office with any questions, concerns, change in symptoms.     Celesta Gentile, DPM

## 2016-05-26 ENCOUNTER — Other Ambulatory Visit: Payer: Self-pay | Admitting: *Deleted

## 2016-05-26 MED ORDER — METFORMIN HCL ER 500 MG PO TB24: ORAL_TABLET | ORAL | 1 refills | Status: DC

## 2016-05-30 ENCOUNTER — Encounter: Payer: Self-pay | Admitting: Hematology and Oncology

## 2016-05-30 ENCOUNTER — Ambulatory Visit (HOSPITAL_BASED_OUTPATIENT_CLINIC_OR_DEPARTMENT_OTHER): Payer: Medicare Other | Admitting: Hematology and Oncology

## 2016-05-30 ENCOUNTER — Telehealth: Payer: Self-pay | Admitting: Hematology and Oncology

## 2016-05-30 ENCOUNTER — Telehealth: Payer: Self-pay | Admitting: *Deleted

## 2016-05-30 ENCOUNTER — Other Ambulatory Visit (HOSPITAL_BASED_OUTPATIENT_CLINIC_OR_DEPARTMENT_OTHER): Payer: Medicare Other

## 2016-05-30 VITALS — BP 137/61 | HR 62 | Temp 97.7°F | Resp 17 | Ht 70.0 in | Wt 203.6 lb

## 2016-05-30 DIAGNOSIS — D696 Thrombocytopenia, unspecified: Secondary | ICD-10-CM | POA: Diagnosis not present

## 2016-05-30 DIAGNOSIS — C8307 Small cell B-cell lymphoma, spleen: Secondary | ICD-10-CM

## 2016-05-30 DIAGNOSIS — D630 Anemia in neoplastic disease: Secondary | ICD-10-CM

## 2016-05-30 DIAGNOSIS — D539 Nutritional anemia, unspecified: Secondary | ICD-10-CM

## 2016-05-30 LAB — CBC & DIFF AND RETIC
BASO%: 0.5 % (ref 0.0–2.0)
BASOS ABS: 0.1 10*3/uL (ref 0.0–0.1)
EOS%: 1 % (ref 0.0–7.0)
Eosinophils Absolute: 0.1 10*3/uL (ref 0.0–0.5)
HEMATOCRIT: 32.8 % — AB (ref 38.4–49.9)
HGB: 10.1 g/dL — ABNORMAL LOW (ref 13.0–17.1)
Immature Retic Fract: 22.8 % — ABNORMAL HIGH (ref 3.00–10.60)
LYMPH%: 79 % — ABNORMAL HIGH (ref 14.0–49.0)
MCH: 23.6 pg — AB (ref 27.2–33.4)
MCHC: 30.8 g/dL — AB (ref 32.0–36.0)
MCV: 76.6 fL — ABNORMAL LOW (ref 79.3–98.0)
MONO#: 0.4 10*3/uL (ref 0.1–0.9)
MONO%: 3.6 % (ref 0.0–14.0)
NEUT#: 1.8 10*3/uL (ref 1.5–6.5)
NEUT%: 15.9 % — AB (ref 39.0–75.0)
PLATELETS: 132 10*3/uL — AB (ref 140–400)
RBC: 4.28 10*6/uL (ref 4.20–5.82)
RDW: 17.8 % — AB (ref 11.0–14.6)
RETIC %: 1.49 % (ref 0.80–1.80)
Retic Ct Abs: 63.77 10*3/uL (ref 34.80–93.90)
WBC: 11.5 10*3/uL — ABNORMAL HIGH (ref 4.0–10.3)
lymph#: 9.1 10*3/uL — ABNORMAL HIGH (ref 0.9–3.3)

## 2016-05-30 LAB — LACTATE DEHYDROGENASE: LDH: 142 U/L (ref 125–245)

## 2016-05-30 LAB — FERRITIN: Ferritin: 6 ng/ml — ABNORMAL LOW (ref 22–316)

## 2016-05-30 LAB — IRON AND TIBC
%SAT: 7 % — ABNORMAL LOW (ref 20–55)
Iron: 25 ug/dL — ABNORMAL LOW (ref 42–163)
TIBC: 356 ug/dL (ref 202–409)
UIBC: 331 ug/dL (ref 117–376)

## 2016-05-30 NOTE — Assessment & Plan Note (Signed)
This is secondary to consumption. It is minor and the patient feels well. Continue close observation

## 2016-05-30 NOTE — Progress Notes (Signed)
[facility] OFFICE PROGRESS NOTE  Patient Care Team: [name], DO as PCP - General (Family Medicine) [name], MD as Consulting Physician (Gastroenterology) [name], MD as Consulting Physician (Hematology and Oncology) [name], MD as Referring Physician (Cardiology) [name], MD as Consulting Physician (Neurosurgery) [name], OD as Referring Physician  SUMMARY OF ONCOLOGIC HISTORY:  This is a very pleasant gentleman who was found to have abnormal CBC for the past 5 years. In April 2013, flow cytometry detectable monoclonal B-cell population suspicious for splenic/marginal zone lymphoma. Ultrasound confirmed splenomegaly. Due to lack of symptoms he was being observed.  INTERVAL HISTORY: Please see below for problem oriented charting. The patient denies any recent signs or symptoms of bleeding such as spontaneous epistaxis, hematuria or hematochezia. He feels well. Denies recent infection. No new lymphadenopathy. Denies recent night sweats.  REVIEW OF SYSTEMS:   Constitutional: Denies fevers, chills or abnormal weight loss Eyes: Denies blurriness of vision Ears, nose, mouth, throat, and face: Denies mucositis or sore throat Respiratory: Denies cough, dyspnea or wheezes Cardiovascular: Denies palpitation, chest discomfort or lower extremity swelling Gastrointestinal:  Denies nausea, heartburn or change in bowel habits Skin: Denies abnormal skin rashes Lymphatics: Denies new lymphadenopathy or easy bruising Neurological:Denies numbness, tingling or new weaknesses Behavioral/Psych: Mood is stable, no new changes  All other systems were reviewed with the patient and are negative.  I have reviewed the past medical history, past surgical history, social history and family history with the patient and they are unchanged from previous note.  ALLERGIES:  has No Known Allergies.  MEDICATIONS:  Current Outpatient Prescriptions  Medication Sig  Dispense Refill  . aspirin EC 81 MG tablet Take 81 mg by mouth every morning.     Marland Kitchen atorvastatin (LIPITOR) 80 MG tablet Take 80 mg by mouth every morning.     . clopidogrel (PLAVIX) 75 MG tablet Take 1 tablet (75 mg total) by mouth daily. 30 tablet 11  . lisinopril (PRINIVIL,ZESTRIL) 20 MG tablet Take 20 mg by mouth daily.  1  . metFORMIN (GLUCOPHAGE-XR) 500 MG 24 hr tablet TAKE 1 TABLET BY MOUTH WITH EVENING MEAL 90 tablet 1  . metoprolol tartrate (LOPRESSOR) 25 MG tablet Take 1 tablet (25 mg total) by mouth 2 (two) times daily. 60 tablet 3  . nitroGLYCERIN (NITROSTAT) 0.4 MG SL tablet Place 0.4 mg under the tongue every 5 (five) minutes as needed for chest pain.     No current facility-administered medications for this visit.     PHYSICAL EXAMINATION: ECOG PERFORMANCE STATUS: 1 - Symptomatic but completely ambulatory  Vitals:   05/30/16 1315  BP: 137/61  Pulse: 62  Resp: 17  Temp: 97.7 F (36.5 C)   Filed Weights   05/30/16 1315  Weight: 203 lb 9.6 oz (92.4 kg)    GENERAL:alert, no distress and comfortable SKIN: skin color, texture, turgor are normal, no rashes or significant lesions EYES: normal, Conjunctiva are pink and non-injected, sclera clear OROPHARYNX:no exudate, no erythema and lips, buccal mucosa, and tongue normal  NECK: supple, thyroid normal size, non-tender, without nodularity LYMPH:  no palpable lymphadenopathy in the cervical, axillary or inguinal LUNGS: clear to auscultation and percussion with normal breathing effort HEART: regular rate & rhythm and no murmurs and no lower extremity edema ABDOMEN:abdomen soft, non-tender and normal bowel sounds Musculoskeletal:no cyanosis of digits and no clubbing  NEURO: alert & oriented x 3 with fluent speech, no focal motor/sensory deficits  LABORATORY DATA:  I have  reviewed the data as listed    Component Value Date/Time   NA 141 05/03/2016 0841   NA 144 05/31/2015 1404   K 4.3 05/03/2016 0841   K 4.7 05/31/2015  1404   CL 106 05/03/2016 0841   CL 102 10/07/2012 1013   CO2 29 05/03/2016 0841   CO2 29 05/31/2015 1404   GLUCOSE 103 (H) 05/03/2016 0841   GLUCOSE 99 05/31/2015 1404   GLUCOSE 199 (H) 10/07/2012 1013   BUN 17 05/03/2016 0841   BUN 18.3 05/31/2015 1404   CREATININE 0.85 05/03/2016 0841   CREATININE 0.9 05/31/2015 1404   CALCIUM 8.9 05/03/2016 0841   CALCIUM 9.2 05/31/2015 1404   PROT 6.1 05/03/2016 0841   PROT 6.3 (L) 05/31/2015 1404   ALBUMIN 4.1 05/03/2016 0841   ALBUMIN 3.9 05/31/2015 1404   AST 30 05/03/2016 0841   AST 28 05/31/2015 1404   ALT 18 05/03/2016 0841   ALT 23 05/31/2015 1404   ALKPHOS 69 05/03/2016 0841   ALKPHOS 79 05/31/2015 1404   BILITOT 0.4 05/03/2016 0841   BILITOT 0.46 05/31/2015 1404   GFRNONAA >60 05/24/2015 0935   GFRAA >60 05/24/2015 0935    No results found for: SPEP, UPEP  Lab Results  Component Value Date   WBC 11.5 (H) 05/30/2016   NEUTROABS 1.8 05/30/2016   HGB 10.1 (L) 05/30/2016   HCT 32.8 (L) 05/30/2016   MCV 76.6 (L) 05/30/2016   PLT 132 (L) 05/30/2016      Chemistry      Component Value Date/Time   NA 141 05/03/2016 0841   NA 144 05/31/2015 1404   K 4.3 05/03/2016 0841   K 4.7 05/31/2015 1404   CL 106 05/03/2016 0841   CL 102 10/07/2012 1013   CO2 29 05/03/2016 0841   CO2 29 05/31/2015 1404   BUN 17 05/03/2016 0841   BUN 18.3 05/31/2015 1404   CREATININE 0.85 05/03/2016 0841   CREATININE 0.9 05/31/2015 1404      Component Value Date/Time   CALCIUM 8.9 05/03/2016 0841   CALCIUM 9.2 05/31/2015 1404   ALKPHOS 69 05/03/2016 0841   ALKPHOS 79 05/31/2015 1404   AST 30 05/03/2016 0841   AST 28 05/31/2015 1404   ALT 18 05/03/2016 0841   ALT 23 05/31/2015 1404   BILITOT 0.4 05/03/2016 0841   BILITOT 0.46 05/31/2015 1404       ASSESSMENT & PLAN:  Splenic marginal zone b-cell lymphoma Clinically, he has no signs of disease progression. I will see him back on a yearly basis with history, physical examination and  blood work. I educated the patient signs and symptoms to watch out for disease progression.  Anemia in neoplastic disease The patient is noted to have progressive microcytosis. Iron studies confirmed iron deficiency anemia. The patient denies signs of bleeding. He had colonoscopy in 2013 which show diverticular disease with hemorrhoids. The patient is on dual antiplatelet agents. I suspect he may have microscopic bleeding. I recommend iron therapy. If his anemia does not improve, he would need repeat panendoscopy to rule out GI bleed  Thrombocytopenia ([place]) This is secondary to consumption. It is minor and the patient feels well. Continue close observation   Orders Placed This Encounter  Procedures  . CBC with Differential/Platelet    Standing Status:   Future    Standing Expiration Date:   07/04/2017   All questions were answered. The patient knows to call the clinic with any problems, questions or concerns. No barriers to  learning was detected. I spent 15 minutes counseling the patient face to face. The total time spent in the appointment was 20 minutes and more than 50% was on counseling and review of test results     [name], MD 05/30/2016 2:32 PM

## 2016-05-30 NOTE — Telephone Encounter (Signed)
-----   Message from [name], MD sent at 05/30/2016  2:26 PM EDT ----- Regarding: iron def PLs call patient Iron panel confirmed iron deficiency Recommend him to take iron supplement as discussed ----- Message ----- From: Interface, Lab In Three Zero One Sent: 05/30/2016   1:09 PM To: [name], MD

## 2016-05-30 NOTE — Assessment & Plan Note (Signed)
Clinically, he has no signs of disease progression. I will see him back on a yearly basis with history, physical examination and blood work. I educated the patient signs and symptoms to watch out for disease progression.

## 2016-05-30 NOTE — Assessment & Plan Note (Signed)
The patient is noted to have progressive microcytosis. Iron studies confirmed iron deficiency anemia. The patient denies signs of bleeding. He had colonoscopy in 2013 which show diverticular disease with hemorrhoids. The patient is on dual antiplatelet agents. I suspect he may have microscopic bleeding. I recommend iron therapy. If his anemia does not improve, he would need repeat panendoscopy to rule out GI bleed

## 2016-05-30 NOTE — Telephone Encounter (Signed)
Pt notified of message below. States he will go get iron supplement today.

## 2016-05-30 NOTE — Telephone Encounter (Signed)
Gave patient avs report and appointments for November 2018.  °

## 2016-06-21 ENCOUNTER — Other Ambulatory Visit: Payer: Self-pay | Admitting: *Deleted

## 2016-06-21 MED ORDER — LISINOPRIL 20 MG PO TABS: 20.0000 mg | ORAL_TABLET | Freq: Every day | ORAL | 1 refills | Status: DC

## 2016-06-21 NOTE — Telephone Encounter (Signed)
Request for refill on Lisinopril received from pharmacy. We have not filled this medication before. Patient see cardiology Dr [name] at [facility]. Note looks like he refilled this medication for patient as noted in his office visit note dated 11/04/15. Do you want to refill this medication or have pharmacy forward it to Dr [name]? Please advise.

## 2016-06-26 ENCOUNTER — Ambulatory Visit: Payer: Medicare Other | Admitting: Family Medicine

## 2016-07-26 ENCOUNTER — Ambulatory Visit: Payer: Medicare Other | Admitting: Family Medicine

## 2016-08-02 ENCOUNTER — Ambulatory Visit (INDEPENDENT_AMBULATORY_CARE_PROVIDER_SITE_OTHER): Payer: Medicare Other | Admitting: Family Medicine

## 2016-08-02 ENCOUNTER — Encounter: Payer: Self-pay | Admitting: Family Medicine

## 2016-08-02 VITALS — BP 137/81 | HR 52 | Temp 97.6°F | Resp 20 | Ht 70.0 in | Wt 201.0 lb

## 2016-08-02 DIAGNOSIS — I2510 Atherosclerotic heart disease of native coronary artery without angina pectoris: Secondary | ICD-10-CM

## 2016-08-02 DIAGNOSIS — E118 Type 2 diabetes mellitus with unspecified complications: Secondary | ICD-10-CM

## 2016-08-02 DIAGNOSIS — D630 Anemia in neoplastic disease: Secondary | ICD-10-CM

## 2016-08-02 DIAGNOSIS — D539 Nutritional anemia, unspecified: Secondary | ICD-10-CM | POA: Diagnosis not present

## 2016-08-02 DIAGNOSIS — C8307 Small cell B-cell lymphoma, spleen: Secondary | ICD-10-CM

## 2016-08-02 DIAGNOSIS — I10 Essential (primary) hypertension: Secondary | ICD-10-CM | POA: Diagnosis not present

## 2016-08-02 LAB — IRON AND TIBC
%SAT: 7 % — AB (ref 15–60)
Iron: 25 ug/dL — ABNORMAL LOW (ref 50–180)
TIBC: 342 ug/dL (ref 250–425)
UIBC: 317 ug/dL (ref 125–400)

## 2016-08-02 LAB — POCT GLYCOSYLATED HEMOGLOBIN (HGB A1C): HEMOGLOBIN A1C: 5.6

## 2016-08-02 NOTE — Progress Notes (Signed)
Patient ID: Marcus Taylor, male   DOB: 1941/03/10, 75 y.o.   MRN: [id number]      Patient ID: Marcus Taylor, male  DOB: [birth year], 75 y.o.   MRN: [id number]  Subjective:  Marcus Taylor is a 75 y.o. male present for follow up on chronic medical issues.  All past medical history, surgical history, allergies, family history, immunizations, medications and social history were updated in the electronic medical record today.   Patient Care Team    Relationship Specialty Notifications Start End  [name], DO PCP - General Family Medicine  01/26/16   [name], MD Consulting Physician Gastroenterology  01/26/16   [name], MD Consulting Physician Hematology and Oncology  01/26/16   [name], MD Referring Physician Cardiology  01/26/16   [name], MD Consulting Physician Neurosurgery  01/27/16   [name], OD Referring Physician   05/03/16    Comment: opthalmology     Hypertension/H/o MI/CAD/stentsx3/iron deficiency anemia: Pt routinely follows with cardiology at [facility], Dr. [name]. Pt has had stent placement and MI in 2015. He is prescribed Lipitor 80 mg, plavix, lisinopril 20 mg, lopressor 25 (BID). He takes a daily baby asa. He is prescribed nitro, but reports never having to use. He denies chest pain, shortness breath, orthopnea, dyspnea on exertion or lower extremity edema. Echo results from April 2017 reviewed. Patient reports having lower iron his oncology office, they have asked him to take an iron supplementation daily and have his iron rechecked in our office today. Last iron panel 05/31/1999 1725, U IBC 331, TIBC 336. Percent saturation 7, ferritin 6.  Diabetes type 2: She is compliant with metformin 500 mg daily. His last A1c was 6.0. He tries to watch his diet, not. Closely. He is unable to exercise secondary to orthopedic condition. He denies hypoglycemic or hyperglycemic events. He has endorse mild neuropathy in the past, this is unchanged. He denies  nonhealing wounds. He does not routinely check his blood sugars at home. He states he was put on the metformin a few years ago after his heart surgery, but his A1c's were up in the sevens at that time.  - Opthalmology:  04/14/2016. [place] Dr. [name] - pna series UTD/completed  - flu shot due at the day 2017 - cmp every 6 mos,  05/03/2016 - foot exam completed 05/03/2016 - urine micro 05/03/2016, prior elevated microalbuminuria.    CBC Latest Ref Rng & Units 05/30/2016 05/03/2016 05/31/2015  WBC 4.0 - 10.3 10e3/uL 11.5(H) 14.1(H) 13.1(H)  Hemoglobin 13.0 - 17.1 g/dL 10.1(L) 10.1(L) 11.3(L)  Hematocrit 38.4 - 49.9 % 32.8(L) 31.5(L) 35.4(L)  Platelets 140 - 400 10e3/uL 132(L) 183.0 154   Lab Results  Component Value Date   HGBA1C 5.6 08/02/2016    Name: Marcus Taylor, Marcus Taylor Study Date: 11/19/2015 Height: 72 in  MRN: [id number] Weight: 197 lb  DOB: 1941-07-03 Gender: Male BSA: 2.1 m2  Age: 41 yrsEthnicity: Caucasian BP: 146/38 mmHg  Reason For Study: Ventricular arrhythmia    Ordering Physician: [id number] [facility], [name]  Performed By: [name]  Referring Physician: [name]  -  HISTORY  Arrhythmia.  -    PROCEDURE  Study Quality: Fair.  -  SUMMARY  The left ventricular size is normal.    Left ventricular systolic function is normal.  LV ejection fraction = 60-65%.    Left ventricular filling pattern is impaired.  The right ventricle is mildly dilated.  The right ventricular systolic function is normal.  The  left atrium is mildly dilated.  The right atrium is mildly dilated.  There is aortic valve sclerosis.  There is trace aortic regurgitation.  No significant stenosis seen  There is trivial pericardial effusion.  -  FINDINGS:    LEFT VENTRICLE  The left ventricular size is normal. There is normal left ventricular wall   thickness. LV ejection fraction = 60-65%. Left ventricular systolic function   is normal. Left  ventricular filling pattern is impaired. No segmental wall   motion abnormalities seen in the left ventricle.  -    RIGHT VENTRICLE  The right ventricle is mildly dilated. The right ventricular systolic   function is normal.    LEFT ATRIUM  The left atrium is mildly dilated.    RIGHT ATRIUM  The right atrium is mildly dilated.  -  AORTIC VALVE  There is aortic valve sclerosis. There is trace aortic regurgitation.  -  MITRAL VALVE  The mitral valve is normal in structure and function. There is trace mitral   regurgitation.  -  TRICUSPID VALVE  Structurally normal tricuspid valve. There is trace tricuspid regurgitation.   Estimated right ventricular systolic pressure is 31 mmHg. Estimated right   atrial pressure is 5 mmHg..  -  PULMONIC VALVE  The pulmonic valve is not well visualized. Trace pulmonic valvular   regurgitation.  -  ARTERIES  The aortic root is normal size.  -  VENOUS  Pulmonary venous flow pattern is normal. IVC size was normal.  -  EFFUSION  There is trivial pericardial effusion.  -  -  MMode/2D Measurements & Calculations  IVSd: 1.3 cm  LVIDd: 4.5 cm  LVPWd: 1.1 cm  LVIDs: 3.2 cm  LA diam: 4.3 cm  Ao root: 3.3 cm  EDV(MOD-sp4): 142.8 ml  ESV(MOD-sp4): 41.1 ml  LVOT diam: 2.1 cm  LVOT area: 3.4 cm2  SV(MOD-sp4): 101.7 ml  SI(MOD-sp4): 48.1 ml/m2  LA area A4: 27.7 cm2  LA length (vol): 5.6 cm  RA area A4: 29.1 cm2  Doppler Measurements & Calculations  MV E max vel: 57.2 cm/sec  MV A max vel: 39.4 cm/sec  MV E/A: 1.5  Med Peak E' Vel: 6.0 cm/sec  Lat Peak E' Vel: 7.8 cm/sec  E/Lat E`: 7.3  E/Med E`: 9.5  MV dec time: 0.23 sec  SV(LVOT): 59.9 ml  Ao max PG: 5.7 mmHg  Ao mean PG: 2.6 mmHg  Ao V2 VTI: 25.1 cm  AVA (VTI): 2.4 cm2  LV V1 VTI: 17.9 cm  TR max vel: 256.4 cm/sec  TR max PG: 26.3 mmHg  AS Dimensionless Index (VTI): 0.71  AVAi(VTI) cm^2/m^2: 1.1 cm2    SV index(LVOT): 28.3 ml/m2      Reading Physician:  [name], [id number] 11/19/2015 12:13 PM      Past Medical History:  Diagnosis Date  . Anemia   . Arthritis   . Colonic polyp   . Coronary artery disease    stents x 3 (03-2014)  . Diabetes mellitus   . Diverticulosis   . HTN (hypertension)   . Hypertriglyceridemia   . Kidney stone   . Lymphocytosis   . Metatarsal fracture left   left 5th proximal phalanx   . OSA (obstructive sleep apnea)    "Mild" by records  . PONV (postoperative nausea and vomiting)   . RBBB 01/2016  . Splenic marginal zone b-cell lymphoma ([place]) 12/18/2011  . STEMI (ST elevation myocardial infarction) [facility])    August 2015   No Known Allergies  Past Surgical History:  Procedure Laterality Date  . CARDIAC CATHETERIZATION     stents x 3 (03/2014)  . CHOLECYSTECTOMY    . HERNIA REPAIR    . INGUINAL HERNIA REPAIR Right 05/27/2015   Procedure: LAPAROSCOPIC REPAIR RIGHT  INGUINAL HERNIA;  Surgeon: Greer Pickerel, MD;  Location: [facility];  Service: General;  Laterality: Right;  . INSERTION OF MESH Right 05/27/2015   Procedure: INSERTION OF MESH;  Surgeon: Greer Pickerel, MD;  Location: [facility];  Service: General;  Laterality: Right;  . LUMBAR DISC SURGERY    . TONSILLECTOMY AND ADENOIDECTOMY     Family History  Problem Relation Age of Onset  . Kidney disease Sister   . Colon cancer Sister 73  . Colon cancer Brother   . Arthritis Mother   . Breast cancer Mother   . Heart disease Mother   . Bladder Cancer Brother   . Arthritis Son   . Esophageal cancer Sister    Social History   Social History  . Marital status: Married    Spouse name: N/A  . Number of children: 2  . Years of education: N/A   Occupational History  .      used to work as an Proofreader; now retired   Social History Main Topics  . Smoking status: Former Smoker    Packs/day: 1.00    Years: 20.00    Quit date: 08/14/1988  . Smokeless tobacco: Never Used  . Alcohol use No   . Drug use: No  . Sexual activity: Yes    Birth control/ protection: None   Other Topics Concern  . Not on file   Social History Narrative   Married to [place].    HS education. Retired.    Former smoker. No drugs or ETOH.   Wears seatbelt.   Wears dentures.   Smoke detector in the home. Firearms  In the home.    Feels safe in his relationships.    ROS: Negative, with the exception of above mentioned in HPI  Objective: BP 137/81 (BP Location: Right Arm, Patient Position: Sitting, Cuff Size: Normal)   Pulse (!) 52   Temp 97.6 F (36.4 C)   Resp 20   Ht 5\' 10"  (1.778 m)   Wt 201 lb (91.2 kg)   SpO2 97%   BMI 28.84 kg/m   Gen: Afebrile. No acute distress. Nontoxic in appearance. Well developed. Pleasant caucasian male.  HENT: AT. [place]. MMM.  Eyes:Pupils Equal Round Reactive to light, Extraocular movements intact,  Conjunctiva without redness, discharge or icterus. CV: RRR, no edema, +2/4 P posterior tibialis pulses Chest: CTAB, no wheeze or crackles Abd: Soft. Flat. NTND. BS present.   Skin: WWW. Skin intact Neuro: Normal gait. PERLA. EOMi. Alert. Oriented  Assessment/plan: Marcus Taylor is a 75 y.o. male present for chronic medical condition follow up.  Microalbuminuria due to type 2 diabetes mellitus (HCC)/Controlled type 2 diabetes mellitus with complication, without long-term current use of insulin (HCC) - A1c today 5.6 - Patient to change metformin to Monday, Wednesday, Friday use only. - Opthalmology:  04/14/2016. [place] Dr. [name], records requested - pna series UTD/completed  - flu shot UTD 2017 - cmp every 6 mos,  05/03/2016 - foot exam completed 05/03/2016 - followes with podiatry as well  - urine micro 05/03/2016, prior elevated microalbuminuria, pt on ACEi, control RF: HTN, DM - Follow-up 4 months  Essential hypertension/Coronary artery disease involving native coronary artery of native heart without angina pectoris/History of ST  elevation myocardial  infarction (STEMI)/ - stable. Follows routinely with cardiology.  - Continue ASA, plavix, lopressor, lisinopril and statin. - Low sodium diet encouraged. - F/U q 4 months  Anemia in neoplastic disease/iron deficiency/Splenic marginal zone b-cell lymphoma (HCC) - Iron Binding Cap (TIBC) - Recheck iron panel today, taking oral supplementation since October.   Return in about 4 months (around 12/01/2016), or Diabetes/hypertension.    Electronically signed by: Howard Pouch, DO [place]

## 2016-08-02 NOTE — Patient Instructions (Addendum)
Your a1c is 5.6 today! That is great!!! Lets go down on your metformin to taking only on Monday, Wednesday, Friday.  Keep up the good work!  Keep taking your BP daily and if you see it increase above 140/90, then please call in and we will increase a medicine and then see you back in a week.   If Bp is normal at home, then we will see each other in 4 months.   Tell Mrs. Slifko I said "Hello" and Merry Christmas!

## 2016-08-09 ENCOUNTER — Telehealth: Payer: Self-pay | Admitting: Hematology and Oncology

## 2016-08-09 ENCOUNTER — Other Ambulatory Visit: Payer: Self-pay | Admitting: Hematology and Oncology

## 2016-08-09 ENCOUNTER — Telehealth: Payer: Self-pay | Admitting: *Deleted

## 2016-08-09 DIAGNOSIS — D500 Iron deficiency anemia secondary to blood loss (chronic): Secondary | ICD-10-CM

## 2016-08-09 DIAGNOSIS — D509 Iron deficiency anemia, unspecified: Secondary | ICD-10-CM | POA: Insufficient documentation

## 2016-08-09 NOTE — Telephone Encounter (Signed)
sw pt to confirm 08/28/16 appt date/time per LOS

## 2016-08-09 NOTE — Telephone Encounter (Signed)
-----   Message from [name], MD sent at 08/09/2016 10:10 AM EST ----- Regarding: IV iron Can you call and see if he can come in to see me at 1015 am, 15 mins on 1/15? He needs labs first, see me and then IV iron same day ----- Message ----- From: [name], DO Sent: 08/09/2016   8:07 AM To: [name], MD  Collected at his HTN appt. He saw you a few months prior and you started on iron supplement. Iron panel unchanged after supplement.  Dr. Raoul Pitch

## 2016-08-09 NOTE — Telephone Encounter (Signed)
Informed pt of Dr. Calton Dach message.  He agreed to appts on 1/15.  Scheduling message sent to add pt on for Lab/MD/Iron infusion 1/15 and another iron infusion on 1/22.

## 2016-08-11 DIAGNOSIS — M25569 Pain in unspecified knee: Secondary | ICD-10-CM | POA: Diagnosis not present

## 2016-08-18 ENCOUNTER — Ambulatory Visit (INDEPENDENT_AMBULATORY_CARE_PROVIDER_SITE_OTHER): Payer: Medicare Other | Admitting: Podiatry

## 2016-08-18 ENCOUNTER — Encounter: Payer: Self-pay | Admitting: Podiatry

## 2016-08-18 DIAGNOSIS — B351 Tinea unguium: Secondary | ICD-10-CM

## 2016-08-18 DIAGNOSIS — M79676 Pain in unspecified toe(s): Secondary | ICD-10-CM | POA: Diagnosis not present

## 2016-08-18 NOTE — Progress Notes (Signed)
Subjective: 75 y.o. returns the office today for painful, elongated, thickened toenails which he cannot trim himself. Denies any redness or drainage around the nails. Denies any acute changes since last appointment and no new complaints today. Denies any systemic complaints such as fevers, chills, nausea, vomiting.   Objective: AAO 3, NAD DP/PT pulses palpable, CRT less than 3 seconds Nails hypertrophic, dystrophic, elongated, brittle, discolored 10. There is tenderness overlying the nails 1-5 bilaterally. There is no surrounding erythema or drainage along the nail sites. Hammertoes are present.  No open lesions or pre-ulcerative lesions are identified. No other areas of tenderness bilateral lower extremities. No overlying edema, erythema, increased warmth. No pain with calf compression, swelling, warmth, erythema.  Assessment: Patient presents with symptomatic onychomycosis  Plan: -Treatment options including alternatives, risks, complications were discussed -Nails sharply debrided 10 without complication/bleeding. -Discussed daily foot inspection. If there are any changes, to call the office immediately.  -Follow-up in 3 months or sooner if any problems are to arise. In the meantime, encouraged to call the office with any questions, concerns, changes symptoms.  Rhealynn Myhre, DPM  

## 2016-08-28 ENCOUNTER — Encounter: Payer: Self-pay | Admitting: Hematology and Oncology

## 2016-08-28 ENCOUNTER — Ambulatory Visit: Payer: Medicare Other

## 2016-08-28 ENCOUNTER — Ambulatory Visit (HOSPITAL_BASED_OUTPATIENT_CLINIC_OR_DEPARTMENT_OTHER): Payer: Medicare Other | Admitting: Hematology and Oncology

## 2016-08-28 ENCOUNTER — Telehealth: Payer: Self-pay | Admitting: Hematology and Oncology

## 2016-08-28 ENCOUNTER — Other Ambulatory Visit (HOSPITAL_BASED_OUTPATIENT_CLINIC_OR_DEPARTMENT_OTHER): Payer: Medicare Other

## 2016-08-28 DIAGNOSIS — D500 Iron deficiency anemia secondary to blood loss (chronic): Secondary | ICD-10-CM

## 2016-08-28 DIAGNOSIS — C8307 Small cell B-cell lymphoma, spleen: Secondary | ICD-10-CM

## 2016-08-28 DIAGNOSIS — D630 Anemia in neoplastic disease: Secondary | ICD-10-CM

## 2016-08-28 DIAGNOSIS — D696 Thrombocytopenia, unspecified: Secondary | ICD-10-CM | POA: Diagnosis not present

## 2016-08-28 LAB — CBC & DIFF AND RETIC
BASO%: 0.4 % (ref 0.0–2.0)
Basophils Absolute: 0 10*3/uL (ref 0.0–0.1)
EOS ABS: 0.1 10*3/uL (ref 0.0–0.5)
EOS%: 0.9 % (ref 0.0–7.0)
HCT: 37.1 % — ABNORMAL LOW (ref 38.4–49.9)
HGB: 11.8 g/dL — ABNORMAL LOW (ref 13.0–17.1)
IMMATURE RETIC FRACT: 12.5 % — AB (ref 3.00–10.60)
LYMPH%: 76.9 % — AB (ref 14.0–49.0)
MCH: 25.7 pg — AB (ref 27.2–33.4)
MCHC: 31.8 g/dL — ABNORMAL LOW (ref 32.0–36.0)
MCV: 80.8 fL (ref 79.3–98.0)
MONO#: 0.3 10*3/uL (ref 0.1–0.9)
MONO%: 3.3 % (ref 0.0–14.0)
NEUT%: 18.5 % — ABNORMAL LOW (ref 39.0–75.0)
NEUTROS ABS: 1.9 10*3/uL (ref 1.5–6.5)
Platelets: 137 10*3/uL — ABNORMAL LOW (ref 140–400)
RBC: 4.59 10*6/uL (ref 4.20–5.82)
RDW: 20.4 % — AB (ref 11.0–14.6)
RETIC %: 1.24 % (ref 0.80–1.80)
Retic Ct Abs: 56.92 10*3/uL (ref 34.80–93.90)
WBC: 10.2 10*3/uL (ref 4.0–10.3)
lymph#: 7.9 10*3/uL — ABNORMAL HIGH (ref 0.9–3.3)

## 2016-08-28 LAB — FERRITIN: FERRITIN: 11 ng/mL — AB (ref 22–316)

## 2016-08-28 NOTE — Assessment & Plan Note (Signed)
This is secondary to consumption and splenomegaly. It is minor and the patient feels well. Continue close observation

## 2016-08-28 NOTE — Assessment & Plan Note (Signed)
Clinically, he has no signs of disease progression. I will see him back at the end of the year and then on an annual basis with history, physical examination and blood work. I educated the patient signs and symptoms to watch out for disease progression.

## 2016-08-28 NOTE — Telephone Encounter (Signed)
Iron Tx cancelled for next week, per Dr Burr Medico, per 08/28/16 los. No additional appointments needed, keep November appointment as is,  per 08/28/16 los. Patient was given a copy of the AVS report and appointment schedule, per 08/28/16 los.

## 2016-08-28 NOTE — Progress Notes (Signed)
[place] OFFICE PROGRESS NOTE  Patient Care Team: [name], DO as PCP - General (Family Medicine) [name], MD as Consulting Physician (Gastroenterology) [name], MD as Consulting Physician (Hematology and Oncology) [name], MD as Referring Physician (Cardiology) [name], MD as Consulting Physician (Neurosurgery) [name], OD as Referring Physician  SUMMARY OF ONCOLOGIC HISTORY:  This is a very pleasant gentleman who was found to have abnormal CBC for the past 5 years. In April 2013, flow cytometry detectable monoclonal B-cell population suspicious for splenic/marginal zone lymphoma. Ultrasound confirmed splenomegaly. Due to lack of symptoms he was being observed. In October 2017, he was noted to have microcytic, iron deficiency anemia. He was started on oral iron supplement  INTERVAL HISTORY: Please see below for problem oriented charting. The patient denies any recent signs or symptoms of bleeding such as spontaneous epistaxis, hematuria or hematochezia. He feels well. Denies recent infection. No new lymphadenopathy. Denies recent night sweats. He tolerated oral iron supplement well without any side effects  REVIEW OF SYSTEMS:   Constitutional: Denies fevers, chills or abnormal weight loss Eyes: Denies blurriness of vision Ears, nose, mouth, throat, and face: Denies mucositis or sore throat Respiratory: Denies cough, dyspnea or wheezes Cardiovascular: Denies palpitation, chest discomfort or lower extremity swelling Gastrointestinal:  Denies nausea, heartburn or change in bowel habits Skin: Denies abnormal skin rashes Lymphatics: Denies new lymphadenopathy or easy bruising Neurological:Denies numbness, tingling or new weaknesses Behavioral/Psych: Mood is stable, no new changes  All other systems were reviewed with the patient and are negative.  I have reviewed the past medical history, past surgical history, social history and  family history with the patient and they are unchanged from previous note.  ALLERGIES:  has No Known Allergies.  MEDICATIONS:  Current Outpatient Prescriptions  Medication Sig Dispense Refill  . aspirin EC 81 MG tablet Take 81 mg by mouth every morning.     Marland Kitchen atorvastatin (LIPITOR) 80 MG tablet Take 80 mg by mouth every morning.     . clopidogrel (PLAVIX) 75 MG tablet Take 1 tablet (75 mg total) by mouth daily. 30 tablet 11  . ferrous sulfate 325 (65 FE) MG tablet Take 325 mg by mouth daily with breakfast.    . lisinopril (PRINIVIL,ZESTRIL) 20 MG tablet Take 1 tablet (20 mg total) by mouth daily. 90 tablet 1  . metFORMIN (GLUCOPHAGE-XR) 500 MG 24 hr tablet TAKE 1 TABLET BY MOUTH WITH EVENING MEAL 90 tablet 1  . metoprolol succinate (TOPROL-XL) 12.5 mg TB24 24 hr tablet Take 12.5 mg by mouth daily.    . nitroGLYCERIN (NITROSTAT) 0.4 MG SL tablet Place 0.4 mg under the tongue every 5 (five) minutes as needed for chest pain.     No current facility-administered medications for this visit.     PHYSICAL EXAMINATION: ECOG PERFORMANCE STATUS: 1 - Symptomatic but completely ambulatory  Vitals:   08/28/16 1029  BP: (!) 144/67  Pulse: 62  Resp: 18  Temp: 97.4 F (36.3 C)   Filed Weights   08/28/16 1029  Weight: 206 lb (93.4 kg)    GENERAL:alert, no distress and comfortable SKIN: skin color, texture, turgor are normal, no rashes or significant lesions EYES: normal, Conjunctiva are pink and non-injected, sclera clear OROPHARYNX:no exudate, no erythema and lips, buccal mucosa, and tongue normal  NECK: supple, thyroid normal size, non-tender, without nodularity LYMPH:  no palpable lymphadenopathy in the cervical, axillary or inguinal LUNGS: clear to auscultation and percussion with normal breathing effort  HEART: regular rate & rhythm and no murmurs and no lower extremity edema ABDOMEN:abdomen soft, non-tender and normal bowel sounds. Palpable splenomegaly Musculoskeletal:no cyanosis of  digits and no clubbing  NEURO: alert & oriented x 3 with fluent speech, no focal motor/sensory deficits  LABORATORY DATA:  I have reviewed the data as listed    Component Value Date/Time   NA 141 05/03/2016 0841   NA 144 05/31/2015 1404   K 4.3 05/03/2016 0841   K 4.7 05/31/2015 1404   CL 106 05/03/2016 0841   CL 102 10/07/2012 1013   CO2 29 05/03/2016 0841   CO2 29 05/31/2015 1404   GLUCOSE 103 (H) 05/03/2016 0841   GLUCOSE 99 05/31/2015 1404   GLUCOSE 199 (H) 10/07/2012 1013   BUN 17 05/03/2016 0841   BUN 18.3 05/31/2015 1404   CREATININE 0.85 05/03/2016 0841   CREATININE 0.9 05/31/2015 1404   CALCIUM 8.9 05/03/2016 0841   CALCIUM 9.2 05/31/2015 1404   PROT 6.1 05/03/2016 0841   PROT 6.3 (L) 05/31/2015 1404   ALBUMIN 4.1 05/03/2016 0841   ALBUMIN 3.9 05/31/2015 1404   AST 30 05/03/2016 0841   AST 28 05/31/2015 1404   ALT 18 05/03/2016 0841   ALT 23 05/31/2015 1404   ALKPHOS 69 05/03/2016 0841   ALKPHOS 79 05/31/2015 1404   BILITOT 0.4 05/03/2016 0841   BILITOT 0.46 05/31/2015 1404   GFRNONAA >60 05/24/2015 0935   GFRAA >60 05/24/2015 0935    No results found for: SPEP, UPEP  Lab Results  Component Value Date   WBC 10.2 08/28/2016   NEUTROABS 1.9 08/28/2016   HGB 11.8 (L) 08/28/2016   HCT 37.1 (L) 08/28/2016   MCV 80.8 08/28/2016   PLT 137 (L) 08/28/2016      Chemistry      Component Value Date/Time   NA 141 05/03/2016 0841   NA 144 05/31/2015 1404   K 4.3 05/03/2016 0841   K 4.7 05/31/2015 1404   CL 106 05/03/2016 0841   CL 102 10/07/2012 1013   CO2 29 05/03/2016 0841   CO2 29 05/31/2015 1404   BUN 17 05/03/2016 0841   BUN 18.3 05/31/2015 1404   CREATININE 0.85 05/03/2016 0841   CREATININE 0.9 05/31/2015 1404      Component Value Date/Time   CALCIUM 8.9 05/03/2016 0841   CALCIUM 9.2 05/31/2015 1404   ALKPHOS 69 05/03/2016 0841   ALKPHOS 79 05/31/2015 1404   AST 30 05/03/2016 0841   AST 28 05/31/2015 1404   ALT 18 05/03/2016 0841   ALT 23  05/31/2015 1404   BILITOT 0.4 05/03/2016 0841   BILITOT 0.46 05/31/2015 1404      ASSESSMENT & PLAN:  Splenic marginal zone b-cell lymphoma Clinically, he has no signs of disease progression. I will see him back at the end of the year and then on an annual basis with history, physical examination and blood work. I educated the patient signs and symptoms to watch out for disease progression.  Thrombocytopenia ([place]) This is secondary to consumption and splenomegaly. It is minor and the patient feels well. Continue close observation  Anemia in neoplastic disease The patient is noted to have progressive microcytosis. Iron studies confirmed iron deficiency anemia. The patient denies signs of bleeding. He had colonoscopy in 2013 which show diverticular disease with hemorrhoids. The patient is on dual antiplatelet agents. I suspect he may have microscopic bleeding. I recommend iron therapy. He appears to be responsive take well to oral iron supplements with  improvement of his MCV and anemia I recommend he continues oral iron supplementation for now I will cancel his intravenous iron treatment   No orders of the defined types were placed in this encounter.  All questions were answered. The patient knows to call the clinic with any problems, questions or concerns. No barriers to learning was detected. I spent 15 minutes counseling the patient face to face. The total time spent in the appointment was 20 minutes and more than 50% was on counseling and review of test results     [name], MD 08/28/2016 11:11 AM

## 2016-08-28 NOTE — Assessment & Plan Note (Signed)
The patient is noted to have progressive microcytosis. Iron studies confirmed iron deficiency anemia. The patient denies signs of bleeding. He had colonoscopy in 2013 which show diverticular disease with hemorrhoids. The patient is on dual antiplatelet agents. I suspect he may have microscopic bleeding. I recommend iron therapy. He appears to be responsive take well to oral iron supplements with improvement of his MCV and anemia I recommend he continues oral iron supplementation for now I will cancel his intravenous iron treatment

## 2016-09-04 ENCOUNTER — Ambulatory Visit: Payer: Medicare Other

## 2016-09-04 ENCOUNTER — Telehealth: Payer: Self-pay | Admitting: Family Medicine

## 2016-09-04 DIAGNOSIS — G8929 Other chronic pain: Secondary | ICD-10-CM

## 2016-09-04 DIAGNOSIS — M25562 Pain in left knee: Secondary | ICD-10-CM

## 2016-09-04 DIAGNOSIS — M1990 Unspecified osteoarthritis, unspecified site: Secondary | ICD-10-CM

## 2016-09-04 DIAGNOSIS — M25561 Pain in right knee: Secondary | ICD-10-CM

## 2016-09-04 NOTE — Telephone Encounter (Signed)
Patient's wife walked into office and states that Pt would like to go see a specialist about his knees?  Can Dr Raoul Pitch do referral or does patient need an appointment?  Please advise.

## 2016-09-04 NOTE — Telephone Encounter (Signed)
pts request for referral to ortho for his chronic knee pain/arthritis has been placed. Please let him/wife know.

## 2016-09-04 NOTE — Telephone Encounter (Signed)
Spoke with patient reviewed information. Patient verbalized understanding 

## 2016-09-07 ENCOUNTER — Other Ambulatory Visit: Payer: Self-pay | Admitting: Family Medicine

## 2016-09-22 ENCOUNTER — Ambulatory Visit (INDEPENDENT_AMBULATORY_CARE_PROVIDER_SITE_OTHER): Payer: Medicare Other | Admitting: Orthopaedic Surgery

## 2016-09-22 ENCOUNTER — Ambulatory Visit (INDEPENDENT_AMBULATORY_CARE_PROVIDER_SITE_OTHER): Payer: Medicare Other

## 2016-09-22 ENCOUNTER — Encounter (INDEPENDENT_AMBULATORY_CARE_PROVIDER_SITE_OTHER): Payer: Self-pay | Admitting: Orthopaedic Surgery

## 2016-09-22 VITALS — BP 144/75 | HR 61 | Ht 70.0 in | Wt 206.0 lb

## 2016-09-22 DIAGNOSIS — M25562 Pain in left knee: Secondary | ICD-10-CM

## 2016-09-22 DIAGNOSIS — M25551 Pain in right hip: Secondary | ICD-10-CM

## 2016-09-22 DIAGNOSIS — M25561 Pain in right knee: Secondary | ICD-10-CM

## 2016-09-22 DIAGNOSIS — M545 Low back pain: Secondary | ICD-10-CM | POA: Diagnosis not present

## 2016-09-22 DIAGNOSIS — M25552 Pain in left hip: Secondary | ICD-10-CM

## 2016-09-22 DIAGNOSIS — G8929 Other chronic pain: Secondary | ICD-10-CM

## 2016-09-22 MED ORDER — METHYLPREDNISOLONE ACETATE 40 MG/ML IJ SUSP
80.0000 mg | INTRAMUSCULAR | Status: AC | PRN
  Administered 2016-09-22: 80 mg

## 2016-09-22 MED ORDER — LIDOCAINE HCL 1 % IJ SOLN
5.0000 mL | INTRAMUSCULAR | Status: AC | PRN
  Administered 2016-09-22: 5 mL

## 2016-09-22 MED ORDER — BUPIVACAINE HCL 0.5 % IJ SOLN
3.0000 mL | INTRAMUSCULAR | Status: AC | PRN
  Administered 2016-09-22: 3 mL via INTRA_ARTICULAR

## 2016-09-22 NOTE — Progress Notes (Signed)
Office Visit Note   Patient: Marcus [place]           Date of Birth: 20-Nov-1940           MRN: [id number] Visit Date: 09/22/2016              Requested by: [name], DO [address] [place],  [address] PCP: Howard Pouch, DO   Assessment & Plan: Visit Diagnoses: Osteoarthritis bilateral knees, left more symptomatic than right. Chronic low back pain with possible radiculopathy to both lower extremities.   Plan: Cortisone injection left knee, follow-up in 2 weeks for consideration of aspiration and injection of the right knee. MRI scan of lumbar spine. Spent over an hour discussing all of the above and possible implications and treatment options etc.  Follow-Up Instructions: No Follow-up on file.   Orders:  No orders of the defined types were placed in this encounter.  No orders of the defined types were placed in this encounter.     Procedures: Large Joint Inj Date/Time: 09/22/2016 9:12 AM Performed by: Garald Balding Authorized by: Garald Balding   Consent Given by:  Patient Timeout: prior to procedure the correct patient, procedure, and site was verified   Indications:  Pain and joint swelling Location:  Knee Site:  L knee Prep: patient was prepped and draped in usual sterile fashion   Needle Size:  25 G Needle Length:  1.5 inches Approach:  Anteromedial Ultrasound Guidance: No   Fluoroscopic Guidance: No   Arthrogram: No   Medications:  3 mL bupivacaine 0.5 %; 5 mL lidocaine 1 %; 80 mg methylPREDNISolone acetate 40 MG/ML Aspiration Attempted: No   Patient tolerance:  Patient tolerated the procedure well with no immediate complications       Clinical Data: No additional findings.   Subjective: Chief Complaint  Patient presents with  . Right Knee - Pain  . Left Knee - Pain    Patient is a 76 y.o male who presents in office today for bilateral knee pain. He has had chronic pain in right knee for three years now and pain in the left  knee since December of last year. He has no known injury. He does complain of catching, popping, swelling. In his left knee he has pain laterally. Right knee is nonspecific pain. His pain worsens going up steps. He is currently taking tylenol. Previously was evaluated at local urgent care in [place] due to acute pain in left knee. He had imaging done and was told he had a chipped bone, but did not bring imaging with him today. Maxcine Ham, RT   Marcus [place] has had chronic problem with both of his knees. He recently was seen as mentioned above at an urgent care facility in [place] and it was suggested he follow-up for an orthopedic evaluation. He presently is having more trouble on the left than the right knee without history of injury or trauma.  In addition he's had chronic problem with his lumbar spine. He's had 3 prior back surgeries for an apparent "fusion". He developed an infection postoperatively  with "staph.". He has experienced some pain in both of his legs which might be attributable to his lumbar spine. On occasion is experiencing some groin pain bilaterally. Review of Systems   Objective: Vital Signs: Ht 5\' 10"  (1.778 m)   Wt 206 lb (93.4 kg)   BMI 29.56 kg/m   Physical Exam  Ortho Exam straight leg raise is negative bilaterally  reflexes appear to be symmetrical. There was a large effusion of the right knee with mild medial joint pain and very mild patellar crepitation. He lacked approximately 10 to full knee extension. Flexion was over 100. Minimal effusion of the right knee with predominantly medial joint pain. No instability either knee. Mild loss of extension left knee.. No calf pain. Thickened skin over both patella but without redness or pain. Normal sensibility to both feet +1 pulses. No percussible tenderness of lumbar spine. Mild discomfort in both hips with internal/external rotation. Motion symmetrical of both hips. Localized areas of tenderness in either thigh or  calf.  Specialty Comments:  No specialty comments available.  Imaging: No results found.   PMFS History: Patient Active Problem List   Diagnosis Date Noted  . IDA (iron deficiency anemia) 08/09/2016  . Thrombocytopenia ([place]) 05/30/2016  . Deficiency anemia 05/04/2016  . Diabetes mellitus type 2, controlled, with complications ([place]) [id number]  . Right inguinal hernia 02/03/2016  . History of colon polyps 01/27/2016  . History of ST elevation myocardial infarction (STEMI) 01/27/2016  . CAD (coronary artery disease) 01/27/2016  . Hyperlipidemia 01/27/2016  . Anemia in neoplastic disease 05/25/2014  . Splenic marginal zone b-cell lymphoma ([place]) 12/18/2011  . HTN (hypertension)    Past Medical History:  Diagnosis Date  . Anemia   . Arthritis   . Colonic polyp   . Coronary artery disease    stents x 3 (03-2014)  . Diabetes mellitus   . Diverticulosis   . HTN (hypertension)   . Hypertriglyceridemia   . Kidney stone   . Lymphocytosis   . Metatarsal fracture left   left 5th proximal phalanx   . OSA (obstructive sleep apnea)    "Mild" by records  . PONV (postoperative nausea and vomiting)   . RBBB 01/2016  . Splenic marginal zone b-cell lymphoma ([place]) 12/18/2011  . STEMI (ST elevation myocardial infarction) [facility])    August 2015    Family History  Problem Relation Age of Onset  . Kidney disease Sister   . Colon cancer Sister 57  . Colon cancer Brother   . Arthritis Mother   . Breast cancer Mother   . Heart disease Mother   . Bladder Cancer Brother   . Arthritis Son   . Esophageal cancer Sister     Past Surgical History:  Procedure Laterality Date  . CARDIAC CATHETERIZATION     stents x 3 (03/2014)  . CHOLECYSTECTOMY    . HERNIA REPAIR    . INGUINAL HERNIA REPAIR Right 05/27/2015   Procedure: LAPAROSCOPIC REPAIR RIGHT  INGUINAL HERNIA;  Surgeon: Greer Pickerel, MD;  Location: [facility];  Service: General;  Laterality: Right;  . INSERTION OF MESH Right 05/27/2015    Procedure: INSERTION OF MESH;  Surgeon: Greer Pickerel, MD;  Location: [facility];  Service: General;  Laterality: Right;  . LUMBAR DISC SURGERY    . TONSILLECTOMY AND ADENOIDECTOMY     Social History   Occupational History  .      used to work as an Proofreader; now retired   Social History Main Topics  . Smoking status: Former Smoker    Packs/day: 1.00    Years: 20.00    Quit date: 08/14/1988  . Smokeless tobacco: Never Used  . Alcohol use No  . Drug use: No  . Sexual activity: Yes    Birth control/ protection: None

## 2016-10-04 ENCOUNTER — Encounter (INDEPENDENT_AMBULATORY_CARE_PROVIDER_SITE_OTHER): Payer: Self-pay | Admitting: Orthopedic Surgery

## 2016-10-04 ENCOUNTER — Ambulatory Visit (INDEPENDENT_AMBULATORY_CARE_PROVIDER_SITE_OTHER): Payer: Medicare Other | Admitting: Orthopedic Surgery

## 2016-10-04 ENCOUNTER — Telehealth (INDEPENDENT_AMBULATORY_CARE_PROVIDER_SITE_OTHER): Payer: Self-pay

## 2016-10-04 VITALS — BP 148/84 | HR 48 | Resp 14 | Ht 69.0 in | Wt 203.0 lb

## 2016-10-04 DIAGNOSIS — M1711 Unilateral primary osteoarthritis, right knee: Secondary | ICD-10-CM

## 2016-10-04 DIAGNOSIS — M25461 Effusion, right knee: Secondary | ICD-10-CM

## 2016-10-04 DIAGNOSIS — G8929 Other chronic pain: Secondary | ICD-10-CM

## 2016-10-04 DIAGNOSIS — M25561 Pain in right knee: Secondary | ICD-10-CM | POA: Diagnosis not present

## 2016-10-04 LAB — SYNOVIAL CELL COUNT + DIFF, W/ CRYSTALS
Basophils, %: 0 %
EOSINOPHILS-SYNOVIAL: 0 % (ref 0–2)
Lymphocytes-Synovial Fld: 69 % (ref 0–74)
Monocyte/Macrophage: 16 % (ref 0–69)
NEUTROPHIL, SYNOVIAL: 10 % (ref 0–24)
Synoviocytes, %: 5 % (ref 0–15)
WBC, SYNOVIAL: 305 {cells}/uL — AB (ref <150)

## 2016-10-04 MED ORDER — BUPIVACAINE HCL 0.5 % IJ SOLN
3.0000 mL | INTRAMUSCULAR | Status: AC | PRN
  Administered 2016-10-04: 3 mL via INTRA_ARTICULAR

## 2016-10-04 MED ORDER — LIDOCAINE HCL 1 % IJ SOLN
5.0000 mL | INTRAMUSCULAR | Status: AC | PRN
  Administered 2016-10-04: 5 mL

## 2016-10-04 MED ORDER — METHYLPREDNISOLONE ACETATE 40 MG/ML IJ SUSP
80.0000 mg | INTRAMUSCULAR | Status: AC | PRN
  Administered 2016-10-04: 80 mg

## 2016-10-04 NOTE — Progress Notes (Signed)
Office Visit Note   Patient: Marcus [place]           Date of Birth: [birth year]           MRN: [id number] Visit Date: 10/04/2016              Requested by: [name], DO [address] [place], [place] [address] PCP: Howard Pouch, DO   Assessment & Plan: Visit Diagnoses:  1. Unilateral primary osteoarthritis, right knee   2. Chronic pain of right knee   3. Effusion, right knee     Plan:  #1: The right knee was prepped and draped and aspirated 35 mL of yellow some blood tinging. The knee was then injected with Depo-Medrol Xylocaine and Marcaine. The procedure well. #2: Fluid sent for analysis #3: He will follow back up after his MRI of his lumbar spine for treatment options  Follow-Up Instructions: Return if symptoms worsen or fail to improve.   Orders:  Orders Placed This Encounter  Procedures  . Large Joint Injection/Arthrocentesis  . Body Fluid Culture  . Cell count + diff,  w/ cryst-synvl fld   No orders of the defined types were placed in this encounter.     Procedures: Large Joint Inj Date/Time: 10/04/2016 9:45 AM Performed by: Biagio Borg D Authorized by: Biagio Borg D   Consent Given by:  Patient Timeout: prior to procedure the correct patient, procedure, and site was verified   Indications:  Pain and joint swelling Location:  Knee Site:  R knee Prep: patient was prepped and draped in usual sterile fashion   Needle Size:  25 G Needle Length:  1.5 inches Approach:  Anteromedial Ultrasound Guidance: No   Fluoroscopic Guidance: No   Arthrogram: No   Medications:  3 mL bupivacaine 0.5 %; 5 mL lidocaine 1 %; 80 mg methylPREDNISolone acetate 40 MG/ML Aspiration Attempted: Yes   Aspirate amount (mL):  35 (Yellow with slight blood tinged) Lab: fluid sent for laboratory analysis   Patient tolerance:  Patient tolerated the procedure well with no immediate complications      Clinical Data: No additional findings.   Subjective: Chief  Complaint  Patient presents with  . Left Knee - Pain  . Right Knee - Pain, Injections    Marcus [place] is a 76 y.o male who presents in office today for follow-up of bilateral knee pain. He has had chronic pain in right knee for three years now and pain in the left knee since December of last year. He has no known injury. He does complain of catching, popping, swelling. In his left knee he has pain laterally. Right knee is nonspecific pain. His pain worsens going up steps. He is currently taking tylenol. Previously was evaluated at local urgent care in [place] due to acute pain in left knee  He was seen previously had an aspiration and injection of his left knee with excellent results. He has no pain in the left knee somewhat to what he had prior to his aspiration. He requests the right knee to be injected.      Review of Systems  Cardiovascular:       History of coronary artery disease  Allergic/Immunologic:       History of anemia     Objective: Vital Signs: BP (!) 148/84   Pulse (!) 48   Resp 14   Ht 5\' 9"  (1.753 m)   Wt 203 lb (92.1 kg)   BMI 29.98 kg/m   Physical  Exam  Constitutional: He is oriented to person, place, and time. He appears well-developed and well-nourished.  HENT:  Head: Normocephalic and atraumatic.  Eyes: EOM are normal. Pupils are equal, round, and reactive to light.  Pulmonary/Chest: Effort normal.  Musculoskeletal:       Right knee: He exhibits effusion.  Neurological: He is alert and oriented to person, place, and time.  Skin: Skin is warm and dry.  Psychiatric: He has a normal mood and affect. His behavior is normal. Judgment and thought content normal.    Right Knee Exam   Tenderness  The patient is experiencing tenderness in the medial joint line.  Range of Motion  Extension: 5  Flexion: 100   Other  Erythema: absent Scars: absent Sensation: normal Swelling: mild Other tests: effusion present      Specialty Comments:  No  specialty comments available.  Imaging: No results found.   PMFS History: Patient Active Problem List   Diagnosis Date Noted  . IDA (iron deficiency anemia) 08/09/2016  . Thrombocytopenia ([place]) 05/30/2016  . Deficiency anemia 05/04/2016  . Diabetes mellitus type 2, controlled, with complications ([place]) XX123456  . Right inguinal hernia 02/03/2016  . History of colon polyps 01/27/2016  . History of ST elevation myocardial infarction (STEMI) 01/27/2016  . CAD (coronary artery disease) 01/27/2016  . Hyperlipidemia 01/27/2016  . Anemia in neoplastic disease 05/25/2014  . Splenic marginal zone b-cell lymphoma ([place]) 12/18/2011  . HTN (hypertension)    Past Medical History:  Diagnosis Date  . Anemia   . Arthritis   . Colonic polyp   . Coronary artery disease    stents x 3 (03-2014)  . Diabetes mellitus   . Diverticulosis   . HTN (hypertension)   . Hypertriglyceridemia   . Kidney stone   . Lymphocytosis   . Metatarsal fracture left   left 5th proximal phalanx   . OSA (obstructive sleep apnea)    "Mild" by records  . PONV (postoperative nausea and vomiting)   . RBBB 01/2016  . Splenic marginal zone b-cell lymphoma ([place]) 12/18/2011  . STEMI (ST [facility] myocardial infarction) [facility])    August 2015    Family History  Problem Relation Age of Onset  . Kidney disease Sister   . Colon cancer Sister 20  . Colon cancer Brother   . Arthritis Mother   . Breast cancer Mother   . Heart disease Mother   . Bladder Cancer Brother   . Arthritis Son   . Esophageal cancer Sister     Past Surgical History:  Procedure Laterality Date  . CARDIAC CATHETERIZATION     stents x 3 (03/2014)  . CHOLECYSTECTOMY    . HERNIA REPAIR    . INGUINAL HERNIA REPAIR Right 05/27/2015   Procedure: LAPAROSCOPIC REPAIR RIGHT  INGUINAL HERNIA;  Surgeon: Greer Pickerel, MD;  [facility];  Service: General;  Laterality: Right;  . INSERTION OF MESH Right 05/27/2015   Procedure: INSERTION OF MESH;  Surgeon:  Greer Pickerel, MD;  [facility]: [facility];  Service: General;  Laterality: Right;  . LUMBAR DISC SURGERY    . TONSILLECTOMY AND ADENOIDECTOMY     Social History   Occupational History  .      used to work as an Proofreader; now retired   Social History Main Topics  . Smoking status: Former Smoker    Packs/day: 1.00    Years: 20.00    Quit date: 08/14/1988  . Smokeless tobacco: Never Used  . Alcohol use  No  . Drug use: No  . Sexual activity: Yes    Birth control/ protection: None

## 2016-10-04 NOTE — Progress Notes (Signed)
Pt doing very well with the Left knee corticoid injection on 09/19/16.   Relates "100%" better.  Pt here today for aspiration of Right knee and corticoid injection.

## 2016-10-05 ENCOUNTER — Telehealth (INDEPENDENT_AMBULATORY_CARE_PROVIDER_SITE_OTHER): Payer: Self-pay | Admitting: Orthopaedic Surgery

## 2016-10-05 NOTE — Telephone Encounter (Signed)
l °

## 2016-10-05 NOTE — Telephone Encounter (Signed)
They need to verify the lab test need for [facility].  [id number] option #2 for customer service.  Thank you

## 2016-10-05 NOTE — Telephone Encounter (Signed)
Called lab and they did not have any record of any more questions regarding lab test.

## 2016-10-08 LAB — BODY FLUID CULTURE
GRAM STAIN: NONE SEEN
GRAM STAIN: NONE SEEN
ORGANISM ID, BACTERIA: NO GROWTH

## 2016-10-10 ENCOUNTER — Ambulatory Visit
Admission: RE | Admit: 2016-10-10 | Discharge: 2016-10-10 | Disposition: A | Discharge status: Home / Self Care | Payer: Medicare Other | Source: Ambulatory Visit | Attending: Orthopaedic Surgery | Admitting: Orthopaedic Surgery

## 2016-10-10 DIAGNOSIS — M545 Low back pain: Principal | ICD-10-CM

## 2016-10-10 DIAGNOSIS — G8929 Other chronic pain: Secondary | ICD-10-CM

## 2016-10-10 DIAGNOSIS — M48061 Spinal stenosis, lumbar region without neurogenic claudication: Secondary | ICD-10-CM | POA: Diagnosis not present

## 2016-10-23 ENCOUNTER — Ambulatory Visit (INDEPENDENT_AMBULATORY_CARE_PROVIDER_SITE_OTHER): Payer: Medicare Other | Admitting: Orthopaedic Surgery

## 2016-10-23 ENCOUNTER — Encounter (INDEPENDENT_AMBULATORY_CARE_PROVIDER_SITE_OTHER): Payer: Self-pay | Admitting: Orthopaedic Surgery

## 2016-10-23 VITALS — BP 143/83 | HR 62 | Ht 72.0 in | Wt 185.0 lb

## 2016-10-23 DIAGNOSIS — G8929 Other chronic pain: Secondary | ICD-10-CM | POA: Diagnosis not present

## 2016-10-23 DIAGNOSIS — M5441 Lumbago with sciatica, right side: Secondary | ICD-10-CM

## 2016-10-23 DIAGNOSIS — M5442 Lumbago with sciatica, left side: Secondary | ICD-10-CM | POA: Diagnosis not present

## 2016-10-23 DIAGNOSIS — M4807 Spinal stenosis, lumbosacral region: Secondary | ICD-10-CM

## 2016-10-23 NOTE — Progress Notes (Signed)
Office Visit Note   Patient: Marcus [place]           Date of Birth: 1940-10-23           MRN: [id number] Visit Date: 10/23/2016              Requested by: [name], DO [address] [place], [place] [address] PCP: Howard Pouch, DO   Assessment & Plan: Visit Diagnoses: Degenerative lumbar disc disease with spinal stenosis. We'll schedule an appointment with Dr. Ernestina Patches for consideration of epidural steroid injection. Marcus. [place] has a history of physical therapy evaluation at that does have a home exercise program and encouraged him to restart his exercises    Follow-Up Instructions: No Follow-up on file.   Orders:  No orders of the defined types were placed in this encounter.  No orders of the defined types were placed in this encounter.     Procedures: No procedures performed   Clinical Data: No additional findings. MRI scan demonstrates severe lower lumbar degenerative disc disease with moderate spinal stenosis at L3-4 and L4-5 that's essentially unchanged. This partial involution of the right subarticular distal exctrusion at L3-4. Severe bilateral L3-L4 and L5 neural foraminal stenosis that also is unchanged  Subjective: No chief complaint on file.   Pt here today for LBP and results of MRI lumbar.  Marcus [place] is having predominantly low back pain but some lower extremity discomfort including both knees which we have evaluated with cortisone injections. He is approximately 10 years status post lumbar laminectomy  Review of Systems   Objective: Vital Signs: There were no vitals taken for this visit.  Physical Exam  Ortho ExamStraight leg raise negative bilaterally. motor and sensory exam was intact distally. Mild percussible tenderness of  the lumbar spine. Painless range of motion of both hips with internal/external rotation  Specialty Comments:  No specialty comments available.  Imaging: No results found.   PMFS History: Patient Active Problem  List   Diagnosis Date Noted  . IDA (iron deficiency anemia) 08/09/2016  . Thrombocytopenia ([place]) 05/30/2016  . Deficiency anemia 05/04/2016  . Diabetes mellitus type 2, controlled, with complications ([place]) 93/71/6967  . Right inguinal hernia 02/03/2016  . History of colon polyps 01/27/2016  . History of ST elevation myocardial infarction (STEMI) 01/27/2016  . CAD (coronary artery disease) 01/27/2016  . Hyperlipidemia 01/27/2016  . Anemia in neoplastic disease 05/25/2014  . Splenic marginal zone b-cell lymphoma ([place]) 12/18/2011  . HTN (hypertension)    Past Medical History:  Diagnosis Date  . Anemia   . Arthritis   . Colonic polyp   . Coronary artery disease    stents x 3 (03-2014)  . Diabetes mellitus   . Diverticulosis   . HTN (hypertension)   . Hypertriglyceridemia   . Kidney stone   . Lymphocytosis   . Metatarsal fracture left   left 5th proximal phalanx   . OSA (obstructive sleep apnea)    "Mild" by records  . PONV (postoperative nausea and vomiting)   . RBBB 01/2016  . Splenic marginal zone b-cell lymphoma ([place]) 12/18/2011  . STEMI (ST [facility] myocardial infarction) [facility])    August 2015    Family History  Problem Relation Age of Onset  . Kidney disease Sister   . Colon cancer Sister 92  . Colon cancer Brother   . Arthritis Mother   . Breast cancer Mother   . Heart disease Mother   . Bladder Cancer Brother   .  Arthritis Son   . Esophageal cancer Sister     Past Surgical History:  Procedure Laterality Date  . CARDIAC CATHETERIZATION     stents x 3 (03/2014)  . CHOLECYSTECTOMY    . HERNIA REPAIR    . INGUINAL HERNIA REPAIR Right 05/27/2015   Procedure: LAPAROSCOPIC REPAIR RIGHT  INGUINAL HERNIA;  Surgeon: Greer Pickerel, MD;  [facility]: [facility];  Service: General;  Laterality: Right;  . INSERTION OF MESH Right 05/27/2015   Procedure: INSERTION OF MESH;  Surgeon: Greer Pickerel, MD;  Location: [facility];  Service: General;  Laterality: Right;  . LUMBAR DISC SURGERY      . TONSILLECTOMY AND ADENOIDECTOMY     Social History   Occupational History  .      used to work as an Proofreader; now retired   Social History Main Topics  . Smoking status: Former Smoker    Packs/day: 1.00    Years: 20.00    Quit date: 08/14/1988  . Smokeless tobacco: Never Used  . Alcohol use No  . Drug use: No  . Sexual activity: Yes    Birth control/ protection: None

## 2016-11-09 ENCOUNTER — Encounter: Payer: Self-pay | Admitting: Gastroenterology

## 2016-11-09 DIAGNOSIS — Z87891 Personal history of nicotine dependence: Secondary | ICD-10-CM | POA: Diagnosis not present

## 2016-11-09 DIAGNOSIS — I2510 Atherosclerotic heart disease of native coronary artery without angina pectoris: Secondary | ICD-10-CM | POA: Diagnosis not present

## 2016-11-09 DIAGNOSIS — Z9889 Other specified postprocedural states: Secondary | ICD-10-CM | POA: Diagnosis not present

## 2016-11-09 DIAGNOSIS — Z7901 Long term (current) use of anticoagulants: Secondary | ICD-10-CM | POA: Diagnosis not present

## 2016-11-09 DIAGNOSIS — Z955 Presence of coronary angioplasty implant and graft: Secondary | ICD-10-CM | POA: Diagnosis not present

## 2016-11-09 DIAGNOSIS — Z7982 Long term (current) use of aspirin: Secondary | ICD-10-CM | POA: Diagnosis not present

## 2016-11-14 ENCOUNTER — Encounter (INDEPENDENT_AMBULATORY_CARE_PROVIDER_SITE_OTHER): Payer: Self-pay | Admitting: Physical Medicine and Rehabilitation

## 2016-11-14 ENCOUNTER — Ambulatory Visit (INDEPENDENT_AMBULATORY_CARE_PROVIDER_SITE_OTHER): Payer: Medicare Other | Admitting: Physical Medicine and Rehabilitation

## 2016-11-14 ENCOUNTER — Ambulatory Visit (INDEPENDENT_AMBULATORY_CARE_PROVIDER_SITE_OTHER): Payer: Self-pay

## 2016-11-14 VITALS — BP 134/69 | HR 67 | Temp 98.0°F

## 2016-11-14 DIAGNOSIS — M5416 Radiculopathy, lumbar region: Secondary | ICD-10-CM | POA: Diagnosis not present

## 2016-11-14 MED ORDER — LIDOCAINE HCL (PF) 1 % IJ SOLN
0.3300 mL | Freq: Once | INTRAMUSCULAR | Status: AC
  Administered 2016-11-14: 0.3 mL

## 2016-11-14 MED ORDER — METHYLPREDNISOLONE ACETATE 80 MG/ML IJ SUSP
80.0000 mg | Freq: Once | INTRAMUSCULAR | Status: AC
  Administered 2016-11-14: 80 mg

## 2016-11-14 NOTE — Progress Notes (Unsigned)
                    747\    

## 2016-11-14 NOTE — Patient Instructions (Signed)

## 2016-11-14 NOTE — Progress Notes (Signed)
Marcus Taylor - 76 y.o. male MRN [id number]  Date of birth: Jan 20, 1941  Office Visit Note: Visit Date: 11/14/2016 PCP: Howard Pouch, DO Referred by: [name], DO  Subjective: Chief Complaint  Patient presents with  . Lower Back - Pain   HPI: [name] is a 76 year old gentleman with chronic worsening severe pain across his lower back for several years. His symptoms are bilateral and equal. Says pain is constant but pain level varies. Worse with increased activity. He has some referral of the hips right more than left. Having bilateral knee pain also. Right worse than left. Denies numbness and tingling.   D/c'd Plavix on 11/07/16.  Plan and L5-S1 intralaminar epidural steroid injection today. He has had prior laminectomy decompression at L3-4 and L4-5 he continues to have moderate stenosis. Depending on his relief would look at facet joint blocks at L3-4 and L4-5    ROS Otherwise per HPI.  Assessment & Plan: Visit Diagnoses:  1. Lumbar radiculopathy     Plan: Findings:  Right L5-S1 interlaminar epidural steroid injection.    Meds & Orders:  Meds ordered this encounter  Medications  . lidocaine (PF) (XYLOCAINE) 1 % injection 0.3 mL  . methylPREDNISolone acetate (DEPO-MEDROL) injection 80 mg    Orders Placed This Encounter  Procedures  . XR C-ARM NO REPORT  . Epidural Steroid injection    Follow-up: Return in about 2 weeks (around 11/28/2016) for bilateral L3-4 and L4-5  facets block.   Procedures: No procedures performed  Lumbar Epidural Steroid Injection - Interlaminar Approach with Fluoroscopic Guidance  Patient: Marcus Taylor      Date of Birth: [birth year] MRN: [id number] PCP: Howard Pouch, DO      Visit Date: 11/14/2016   Universal Protocol:    Date/Time: 04/05/185:48 AM  Consent Given By: the patient  Position: PRONE  Additional Comments: Vital signs were monitored before and after the procedure. Patient was prepped and draped in the  usual sterile fashion. The correct patient, procedure, and site was verified.   Injection Procedure Details:  Procedure Site One Meds Administered:  Meds ordered this encounter  Medications  . lidocaine (PF) (XYLOCAINE) 1 % injection 0.3 mL  . methylPREDNISolone acetate (DEPO-MEDROL) injection 80 mg     Laterality: Right  Location/Site:  L5-S1  Needle size: 20 G  Needle type: Tuohy  Needle Placement: Paramedian epidural  Findings:  -Contrast Used: 1 mL iohexol 180 mg iodine/mL   -Comments: Excellent flow of contrast into the epidural space.  Procedure Details: Using a paramedian approach from the side mentioned above, the region overlying the inferior lamina was localized under fluoroscopic visualization and the soft tissues overlying this structure were infiltrated with 4 ml. of 1% Lidocaine without Epinephrine. The Tuohy needle was inserted into the epidural space using a paramedian approach.   The epidural space was localized using loss of resistance along with lateral and bi-planar fluoroscopic views.  After negative aspirate for air, blood, and CSF, a 2 ml. volume of Isovue-250 was injected into the epidural space and the flow of contrast was observed. Radiographs were obtained for documentation purposes.    The injectate was administered into the level noted above.   Additional Comments:  The patient tolerated the procedure well Dressing: Band-Aid    Post-procedure details: Patient was observed during the procedure. Post-procedure instructions were reviewed.  Patient left the clinic in stable condition.    Clinical History: Lumbar spine MRI 10/10/2016 L3-L4: Severe disc space narrowing and severe  facet hypertrophy. There are postsurgical transient prior laminectomies. Moderate central spinal canal stenosis. Severe bilateral neural foraminal stenosis, unchanged. A component of right subarticular disc extrusion with inferior migration has decreased compared  to the prior study.  L4-L5: Severe loss of disc space with diffuse bulge. Posterior decompression. Unchanged moderate spinal canal stenosis and severe bilateral neural foraminal stenosis.  L5-S1: Disc desiccation with diffuse disc bulge. No spinal canal stenosis. Unchanged severe bilateral neural foraminal stenosis.  Visualized sacrum: Normal.  IMPRESSION: 1. Severe lower lumbar degenerative disc disease with moderate spinal canal stenosis at L3-L4 and L4-L5, unchanged. Grade 1 listhesis of L3 on 4 and retrolisthesis of L4 on 5 2. Partial involution of right subarticular disc extrusion of L3-L4. 3. Severe bilateral L3, L4 and L5 neural foraminal stenosis, unchanged.  He reports that he quit smoking about 28 years ago. He has a 20.00 pack-year smoking history. He has never used smokeless tobacco.   Recent Labs  05/03/16 0855 08/02/16 0859 11/15/16 0855  HGBA1C 6.0 5.6 5.9    Objective:  VS:  HT:    WT:   BMI:     BP:134/69  HR:67bpm  TEMP:98 F (36.7 C)( )  RESP:96 % Physical Exam  Musculoskeletal:  Patient has difficulty going from sit to stand he has pain with extension he has good strength in the lower extremity is bilaterally.    Ortho Exam Imaging: No results found.  Past Medical/Family/Surgical/Social History: Medications & Allergies reviewed per EMR Patient Active Problem List   Diagnosis Date Noted  . IDA (iron deficiency anemia) 08/09/2016  . Thrombocytopenia ([place]) 05/30/2016  . Deficiency anemia 05/04/2016  . Diabetes mellitus type 2, controlled, with complications ([place]) 89/21/1941  . Right inguinal hernia 02/03/2016  . History of colon polyps 01/27/2016  . History of ST elevation myocardial infarction (STEMI) 01/27/2016  . CAD (coronary artery disease) 01/27/2016  . Hyperlipidemia 01/27/2016  . Anemia in neoplastic disease 05/25/2014  . Splenic marginal zone b-cell lymphoma ([place]) 12/18/2011  . HTN (hypertension)    Past Medical History:    Diagnosis Date  . Anemia   . Arthritis   . Colonic polyp   . Coronary artery disease    stents x 3 (03-2014)  . Diabetes mellitus   . Diverticulosis   . HTN (hypertension)   . Hypertriglyceridemia   . Kidney stone   . Lymphocytosis   . Metatarsal fracture left   left 5th proximal phalanx   . OSA (obstructive sleep apnea)    "Mild" by records  . PONV (postoperative nausea and vomiting)   . RBBB 01/2016  . Splenic marginal zone b-cell lymphoma ([place]) 12/18/2011  . STEMI (ST elevation myocardial infarction) [facility])    August 2015   Family History  Problem Relation Age of Onset  . Kidney disease Sister   . Colon cancer Sister 27  . Colon cancer Brother   . Arthritis Mother   . Breast cancer Mother   . Heart disease Mother   . Bladder Cancer Brother   . Arthritis Son   . Esophageal cancer Sister    Past Surgical History:  Procedure Laterality Date  . CARDIAC CATHETERIZATION     stents x 3 (03/2014)  . CHOLECYSTECTOMY    . HERNIA REPAIR    . INGUINAL HERNIA REPAIR Right 05/27/2015   Procedure: LAPAROSCOPIC REPAIR RIGHT  INGUINAL HERNIA;  Surgeon: Greer Pickerel, MD;  Location: [facility];  Service: General;  Laterality: Right;  . INSERTION OF MESH Right 05/27/2015   Procedure:  INSERTION OF MESH;  Surgeon: Greer Pickerel, MD;  Location: [facility];  Service: General;  Laterality: Right;  . LUMBAR DISC SURGERY    . TONSILLECTOMY AND ADENOIDECTOMY     Social History   Occupational History  .      used to work as an Proofreader; now retired   Social History Main Topics  . Smoking status: Former Smoker    Packs/day: 1.00    Years: 20.00    Quit date: 08/14/1988  . Smokeless tobacco: Never Used  . Alcohol use No  . Drug use: No  . Sexual activity: Yes    Birth control/ protection: None

## 2016-11-15 ENCOUNTER — Encounter: Payer: Self-pay | Admitting: Family Medicine

## 2016-11-15 ENCOUNTER — Ambulatory Visit (INDEPENDENT_AMBULATORY_CARE_PROVIDER_SITE_OTHER): Payer: Medicare Other | Admitting: Family Medicine

## 2016-11-15 VITALS — BP 138/82 | HR 78 | Temp 97.9°F | Resp 20 | Ht 72.0 in | Wt 203.2 lb

## 2016-11-15 DIAGNOSIS — C8307 Small cell B-cell lymphoma, spleen: Secondary | ICD-10-CM | POA: Diagnosis not present

## 2016-11-15 DIAGNOSIS — I252 Old myocardial infarction: Secondary | ICD-10-CM | POA: Diagnosis not present

## 2016-11-15 DIAGNOSIS — I2510 Atherosclerotic heart disease of native coronary artery without angina pectoris: Secondary | ICD-10-CM

## 2016-11-15 DIAGNOSIS — D696 Thrombocytopenia, unspecified: Secondary | ICD-10-CM | POA: Diagnosis not present

## 2016-11-15 DIAGNOSIS — I10 Essential (primary) hypertension: Secondary | ICD-10-CM

## 2016-11-15 DIAGNOSIS — E118 Type 2 diabetes mellitus with unspecified complications: Secondary | ICD-10-CM

## 2016-11-15 DIAGNOSIS — E785 Hyperlipidemia, unspecified: Secondary | ICD-10-CM

## 2016-11-15 LAB — BASIC METABOLIC PANEL WITH GFR
BUN: 15 mg/dL (ref 7–25)
CALCIUM: 9 mg/dL (ref 8.6–10.3)
CO2: 21 mmol/L (ref 20–31)
CREATININE: 0.85 mg/dL (ref 0.70–1.18)
Chloride: 106 mmol/L (ref 98–110)
GFR, EST NON AFRICAN AMERICAN: 85 mL/min (ref >=60)
Glucose, Bld: 113 mg/dL — ABNORMAL HIGH (ref 65–99)
Potassium: 4.4 mmol/L (ref 3.5–5.3)
SODIUM: 141 mmol/L (ref 135–146)

## 2016-11-15 LAB — POCT GLYCOSYLATED HEMOGLOBIN (HGB A1C): Hemoglobin A1C: 5.9

## 2016-11-15 MED ORDER — LISINOPRIL 20 MG PO TABS: 20.0000 mg | ORAL_TABLET | Freq: Every day | ORAL | 1 refills | Status: DC

## 2016-11-15 MED ORDER — METFORMIN HCL ER 500 MG PO TB24: ORAL_TABLET | ORAL | 1 refills | Status: DC

## 2016-11-15 NOTE — Progress Notes (Signed)
Patient ID: CAROLYN MANISCALCO, male   DOB: 04-18-1941, 76 y.o.   MRN: [id number]      Patient ID: DEQUANN VANDERVELDEN, male  DOB: 1941/02/28, 76 y.o.   MRN: [id number]  Subjective:  HELIO LACK is a 76 y.o. male present for follow up on chronic medical issues.  All past medical history, surgical history, allergies, family history, immunizations, medications and social history were updated in the electronic medical record today.   Patient Care Team    Relationship Specialty Notifications Start End  [name], DO PCP - General Family Medicine  01/26/16   [name], MD Consulting Physician Gastroenterology  01/26/16   [name], MD Consulting Physician Hematology and Oncology  01/26/16   [name], MD Referring Physician Cardiology  01/26/16   [name], MD Consulting Physician Neurosurgery  01/27/16   [name], OD Referring Physician   05/03/16    Comment: opthalmology     Hypertension/H/o MI/CAD/stentsx3/iron deficiency anemia: Pt routinely follows with cardiology at [facility], Dr. [name]. Pt has had stent placement and MI in 2015. He is prescribed Lipitor 80 mg, plavix, lisinopril 20 mg, lopressor 12.5 (BID). He takes a daily baby asa. He is prescribed nitro, but reports never having to use. His chest pain, shortness of breath, orthopnea, dyspnea on exertion or lower extremity edema. Echo results from April 2017 reviewed. He reports compliance with medications.  Diabetes type 2: Patient has been compliant on metformin 500 mg Monday Wednesday and Friday. His last A1c 6.0-5.6 in December. At that time we decreased his dose from daily metformin to metformin Monday Wednesday Friday.  He is unable to exercise secondary to orthopedic condition. He denies hypoglycemic events. He has chronic mild neuropathy that is unchanged. He denies nonhealing wounds. He does not routinely check his blood sugars at home. He states he was put on the metformin a few years ago after his heart surgery,  but his A1c's were up in the sevens at that time.  - Opthalmology:  04/14/2016. [place] Dr. [name] - pna series UTD/completed  - flu shot due at the day 2017 - cmp every 6 mos,  05/03/2016--> due today. - foot exam completed 05/03/2016 - urine micro 05/03/2016, prior elevated microalbuminuria.    CBC Latest Ref Rng & Units 08/28/2016 05/30/2016 05/03/2016  WBC 4.0 - 10.3 10e3/uL 10.2 11.5(H) 14.1(H)  Hemoglobin 13.0 - 17.1 g/dL 11.8(L) 10.1(L) 10.1(L)  Hematocrit 38.4 - 49.9 % 37.1(L) 32.8(L) 31.5(L)  Platelets 140 - 400 10e3/uL 137(L) 132(L) 183.0   Lab Results  Component Value Date   HGBA1C 5.9 11/15/2016    Name: MARQUEZ, CEESAY Study Date: 11/19/2015 Height: 72 in  MRN: [id number] Weight: 197 lb  DOB: 06-24-1941 Gender: Male BSA: 2.1 m2  Age: 76 yrsEthnicity: Caucasian BP: 146/38 mmHg  Reason For Study: Ventricular arrhythmia    Ordering Physician: [id number] [facility], [name]  Performed By: [name]  Referring Physician: [name]  -  HISTORY  Arrhythmia.  -    PROCEDURE  Study Quality: Fair.  -  SUMMARY  The left ventricular size is normal.    Left ventricular systolic function is normal.  LV ejection fraction = 60-65%.    Left ventricular filling pattern is impaired.  The right ventricle is mildly dilated.  The right ventricular systolic function is normal.  The left atrium is mildly dilated.  The right atrium is mildly dilated.  There is aortic valve sclerosis.  There is trace aortic regurgitation.  No significant  stenosis seen  There is trivial pericardial effusion.  -  FINDINGS:    LEFT VENTRICLE  The left ventricular size is normal. There is normal left ventricular wall   thickness. LV ejection fraction = 60-65%. Left ventricular systolic function   is normal. Left ventricular filling pattern is impaired. No segmental wall   motion abnormalities seen in the left ventricle.  -    RIGHT  VENTRICLE  The right ventricle is mildly dilated. The right ventricular systolic   function is normal.    LEFT ATRIUM  The left atrium is mildly dilated.    RIGHT ATRIUM  The right atrium is mildly dilated.  -  AORTIC VALVE  There is aortic valve sclerosis. There is trace aortic regurgitation.  -  MITRAL VALVE  The mitral valve is normal in structure and function. There is trace mitral   regurgitation.  -  TRICUSPID VALVE  Structurally normal tricuspid valve. There is trace tricuspid regurgitation.   Estimated right ventricular systolic pressure is 31 mmHg. Estimated right   atrial pressure is 5 mmHg..  -  PULMONIC VALVE  The pulmonic valve is not well visualized. Trace pulmonic valvular   regurgitation.  -  ARTERIES  The aortic root is normal size.  -  VENOUS  Pulmonary venous flow pattern is normal. IVC size was normal.  -  EFFUSION  There is trivial pericardial effusion.  -  -  MMode/2D Measurements & Calculations  IVSd: 1.3 cm  LVIDd: 4.5 cm  LVPWd: 1.1 cm  LVIDs: 3.2 cm  LA diam: 4.3 cm  Ao root: 3.3 cm  EDV(MOD-sp4): 142.8 ml  ESV(MOD-sp4): 41.1 ml  LVOT diam: 2.1 cm  LVOT area: 3.4 cm2  SV(MOD-sp4): 101.7 ml  SI(MOD-sp4): 48.1 ml/m2  LA area A4: 27.7 cm2  LA length (vol): 5.6 cm  RA area A4: 29.1 cm2  Doppler Measurements & Calculations  MV E max vel: 57.2 cm/sec  MV A max vel: 39.4 cm/sec  MV E/A: 1.5  Med Peak E' Vel: 6.0 cm/sec  Lat Peak E' Vel: 7.8 cm/sec  E/Lat E`: 7.3  E/Med E`: 9.5  MV dec time: 0.23 sec  SV(LVOT): 59.9 ml  Ao max PG: 5.7 mmHg  Ao mean PG: 2.6 mmHg  Ao V2 VTI: 25.1 cm  AVA (VTI): 2.4 cm2  LV V1 VTI: 17.9 cm  TR max vel: 256.4 cm/sec  TR max PG: 26.3 mmHg  AS Dimensionless Index (VTI): 0.71  AVAi(VTI) cm^2/m^2: 1.1 cm2  SV index(LVOT): 28.3 ml/m2      Reading Physician:  [name], [id number] 11/19/2015 12:13 PM      Past Medical  History:  Diagnosis Date  . Anemia   . Arthritis   . Colonic polyp   . Coronary artery disease    stents x 3 (03-2014)  . Diabetes mellitus   . Diverticulosis   . HTN (hypertension)   . Hypertriglyceridemia   . Kidney stone   . Lymphocytosis   . Metatarsal fracture left   left 5th proximal phalanx   . OSA (obstructive sleep apnea)    "Mild" by records  . PONV (postoperative nausea and vomiting)   . RBBB 01/2016  . Splenic marginal zone b-cell lymphoma ([place]) 12/18/2011  . STEMI (ST elevation myocardial infarction) [facility])    August 2015   No Known Allergies Past Surgical History:  Procedure Laterality Date  . CARDIAC CATHETERIZATION     stents x 3 (03/2014)  . CHOLECYSTECTOMY    . HERNIA REPAIR    .  INGUINAL HERNIA REPAIR Right 05/27/2015   Procedure: LAPAROSCOPIC REPAIR RIGHT  INGUINAL HERNIA;  Surgeon: Greer Pickerel, MD;  Location: [facility];  Service: General;  Laterality: Right;  . INSERTION OF MESH Right 05/27/2015   Procedure: INSERTION OF MESH;  Surgeon: Greer Pickerel, MD;  Location: [facility];  Service: General;  Laterality: Right;  . LUMBAR DISC SURGERY    . TONSILLECTOMY AND ADENOIDECTOMY     Family History  Problem Relation Age of Onset  . Kidney disease Sister   . Colon cancer Sister 37  . Colon cancer Brother   . Arthritis Mother   . Breast cancer Mother   . Heart disease Mother   . Bladder Cancer Brother   . Arthritis Son   . Esophageal cancer Sister    Social History   Social History  . Marital status: Married    Spouse name: N/A  . Number of children: 2  . Years of education: N/A   Occupational History  .      used to work as an Proofreader; now retired   Social History Main Topics  . Smoking status: Former Smoker    Packs/day: 1.00    Years: 20.00    Quit date: 08/14/1988  . Smokeless tobacco: Never Used  . Alcohol use No  . Drug use: No  . Sexual activity: Yes    Birth control/ protection: None   Other Topics Concern  . Not on file   Social  History Narrative   Married to [place].    HS education. Retired.    Former smoker. No drugs or ETOH.   Wears seatbelt.   Wears dentures.   Smoke detector in the home. Firearms  In the home.    Feels safe in his relationships.    ROS: Negative, with the exception of above mentioned in HPI  Objective: BP 138/82 (BP Location: Left Arm, Patient Position: Sitting, Cuff Size: Large)   Pulse 78   Temp 97.9 F (36.6 C)   Resp 20   Ht 6' (1.829 m)   Wt 203 lb 4 oz (92.2 kg)   SpO2 97%   BMI 27.57 kg/m   Gen: Afebrile. No acute distress. Very pleasant Caucasian male. HENT: AT. [place]. Bilateral TM visualized and normal in appearance. MMM. Bilateral nares erythema and drainage present. Throat without erythema or exudates. Mild cough, mild hoarseness. No sinus tenderness. Eyes:Pupils Equal Round Reactive to light, Extraocular movements intact,  Conjunctiva without redness, discharge or icterus. Watery eyes. Neck/lymp/endocrine: Supple, no lymphadenopathy CV: RRR, no edema, pulses equal bilateral lower extremity. Chest: CTAB, no wheeze or crackles Abd: Soft. NTND. BS present.    Neuro:  Normal gait. PERLA. EOMi. Alert. Oriented x3  Results for orders placed or performed in visit on 11/15/16 (from the past 48 hour(s))  POCT glycosylated hemoglobin (Hb A1C)     Status: Abnormal   Collection Time: 11/15/16  8:55 AM  Result Value Ref Range   Hemoglobin A1C 5.9     Assessment/plan: RAYANE GALLARDO is a 76 y.o. male present for chronic medical condition follow up.  Microalbuminuria due to type 2 diabetes mellitus (HCC)/Controlled type 2 diabetes mellitus with complication, without long-term current use of insulin (HCC) - A1c 5.9 Today - Continue metformin 500 mg Monday, Wednesday and Friday only.  - Opthalmology:  04/14/2016. [place] Dr. [name], records requested - pna series UTD/completed  - flu shot UTD 2017 - bmp every 6 mos,  05/03/2016--> completed today - foot exam completed 05/03/2016  -  follows with podiatry as well  - urine micro 05/03/2016, prior elevated microalbuminuria, pt on ACEi, control RF: HTN, DM - Follow-up 4 months  Essential hypertension/Coronary artery disease involving native coronary artery of native heart without angina pectoris/History of ST elevation myocardial infarction (STEMI)/ - Stable today. Follows routinely with cardiology.  - Continue ASA, plavix, lopressor, lisinopril and statin. - Low sodium diet encouraged. - F/U q 4 months   - Next appt 3 -4 months for CPE fasting labs (orders placed)   Return in about 4 months (around 03/17/2017) for CPE.    Electronically signed by: Howard Pouch, DO [place]

## 2016-11-15 NOTE — Patient Instructions (Signed)
Allergy: start mucinex plain or Coricidin, nasal saline 2-3x daily and Claritin or allegra.  -/+ flonase nasal spray   Diabetes looks well controlled, continue current regimen.   BP looks good.   Schedule a physical in 3-4 months and have fasting labs 2 days prior.

## 2016-11-16 ENCOUNTER — Telehealth: Payer: Self-pay | Admitting: Family Medicine

## 2016-11-16 ENCOUNTER — Ambulatory Visit (INDEPENDENT_AMBULATORY_CARE_PROVIDER_SITE_OTHER): Payer: Medicare Other | Admitting: Podiatry

## 2016-11-16 ENCOUNTER — Encounter: Payer: Self-pay | Admitting: Podiatry

## 2016-11-16 DIAGNOSIS — B351 Tinea unguium: Secondary | ICD-10-CM | POA: Diagnosis not present

## 2016-11-16 DIAGNOSIS — M79676 Pain in unspecified toe(s): Secondary | ICD-10-CM | POA: Diagnosis not present

## 2016-11-16 NOTE — Telephone Encounter (Signed)
Please call patient: - labs look good, kidney function looks great.

## 2016-11-16 NOTE — Telephone Encounter (Signed)
Spoke with patient reviewed results.

## 2016-11-16 NOTE — Procedures (Signed)
Lumbar Epidural Steroid Injection - Interlaminar Approach with Fluoroscopic Guidance  Patient: Marcus Taylor      Date of Birth: 05-22-1941 MRN: [id number] PCP: Howard Pouch, DO      Visit Date: 11/14/2016   Universal Protocol:    Date/Time: 04/05/185:48 AM  Consent Given By: the patient  Position: PRONE  Additional Comments: Vital signs were monitored before and after the procedure. Patient was prepped and draped in the usual sterile fashion. The correct patient, procedure, and site was verified.   Injection Procedure Details:  Procedure Site One Meds Administered:  Meds ordered this encounter  Medications  . lidocaine (PF) (XYLOCAINE) 1 % injection 0.3 mL  . methylPREDNISolone acetate (DEPO-MEDROL) injection 80 mg     Laterality: Right  Location/Site:  L5-S1  Needle size: 20 G  Needle type: Tuohy  Needle Placement: Paramedian epidural  Findings:  -Contrast Used: 1 mL iohexol 180 mg iodine/mL   -Comments: Excellent flow of contrast into the epidural space.  Procedure Details: Using a paramedian approach from the side mentioned above, the region overlying the inferior lamina was localized under fluoroscopic visualization and the soft tissues overlying this structure were infiltrated with 4 ml. of 1% Lidocaine without Epinephrine. The Tuohy needle was inserted into the epidural space using a paramedian approach.   The epidural space was localized using loss of resistance along with lateral and bi-planar fluoroscopic views.  After negative aspirate for air, blood, and CSF, a 2 ml. volume of Isovue-250 was injected into the epidural space and the flow of contrast was observed. Radiographs were obtained for documentation purposes.    The injectate was administered into the level noted above.   Additional Comments:  The patient tolerated the procedure well Dressing: Band-Aid    Post-procedure details: Patient was observed during the procedure. Post-procedure  instructions were reviewed.  Patient left the clinic in stable condition.

## 2016-11-16 NOTE — Progress Notes (Signed)
Subjective: 75 y.o. returns the office today for painful, elongated, thickened toenails which he cannot trim himself. Denies any redness or drainage around the nails. Denies any acute changes since last appointment and no new complaints today. Denies any systemic complaints such as fevers, chills, nausea, vomiting.   Objective: AAO 3, NAD DP/PT pulses palpable, CRT less than 3 seconds Nails hypertrophic, dystrophic, elongated, brittle, discolored 10. There is tenderness overlying the nails 1-5 bilaterally. There is no surrounding erythema or drainage along the nail sites. Hammertoes are present.  No open lesions or pre-ulcerative lesions are identified. No other areas of tenderness bilateral lower extremities. No overlying edema, erythema, increased warmth. No pain with calf compression, swelling, warmth, erythema.  Assessment: Patient presents with symptomatic onychomycosis  Plan: -Treatment options including alternatives, risks, complications were discussed -Nails sharply debrided 10 without complication/bleeding. -Discussed daily foot inspection. If there are any changes, to call the office immediately.  -Follow-up in 3 months or sooner if any problems are to arise. In the meantime, encouraged to call the office with any questions, concerns, changes symptoms.  Shamonica Schadt, DPM  

## 2016-11-28 ENCOUNTER — Ambulatory Visit (INDEPENDENT_AMBULATORY_CARE_PROVIDER_SITE_OTHER): Payer: Medicare Other | Admitting: Physical Medicine and Rehabilitation

## 2016-11-28 ENCOUNTER — Ambulatory Visit (INDEPENDENT_AMBULATORY_CARE_PROVIDER_SITE_OTHER): Payer: Medicare Other

## 2016-11-28 ENCOUNTER — Encounter (INDEPENDENT_AMBULATORY_CARE_PROVIDER_SITE_OTHER): Payer: Self-pay | Admitting: Physical Medicine and Rehabilitation

## 2016-11-28 VITALS — BP 133/74 | HR 63 | Temp 98.1°F

## 2016-11-28 DIAGNOSIS — M47816 Spondylosis without myelopathy or radiculopathy, lumbar region: Secondary | ICD-10-CM | POA: Diagnosis not present

## 2016-11-28 MED ORDER — LIDOCAINE HCL (PF) 1 % IJ SOLN
0.3300 mL | Freq: Once | INTRAMUSCULAR | Status: AC
  Administered 2016-11-28: 0.3 mL

## 2016-11-28 MED ORDER — METHYLPREDNISOLONE ACETATE 80 MG/ML IJ SUSP
80.0000 mg | Freq: Once | INTRAMUSCULAR | Status: AC
  Administered 2016-11-28: 80 mg

## 2016-11-28 NOTE — Progress Notes (Signed)
Marcus Taylor - 76 y.o. male MRN [id number]  Date of birth: [birth year]  Office Visit Note: Visit Date: 11/28/2016 PCP: Howard Pouch, DO Referred by: [name], DO  Subjective: Chief Complaint  Patient presents with  . Lower Back - Pain   HPI: [name] is a very pleasant 76 year old gentleman with right-sided low back pain without much referral in the hip at this point. He reports pain in the center and to the right low back. States he is doing better since last injection which was an L5-S1 interlaminar epidural steroid injection and he would say has had at least 60% relief with injection. His hip pain is much better. He does have facet arthropathy particularly at the L3-4 region. He's had prior laminectomy.     ROS Otherwise per HPI.  Assessment & Plan: Visit Diagnoses:  1. Spondylosis without myelopathy or radiculopathy, lumbar region     Plan: Findings:  Diagnostic and therapeutic right L3-4 facet joint block. Depending on his relief would look at trigger point injection versus physical therapy with dry needling.    Meds & Orders:  Meds ordered this encounter  Medications  . lidocaine (PF) (XYLOCAINE) 1 % injection 0.3 mL  . methylPREDNISolone acetate (DEPO-MEDROL) injection 80 mg    Orders Placed This Encounter  Procedures  . Facet Injection  . XR C-ARM NO REPORT    Follow-up: Return if symptoms worsen or fail to improve.   Procedures: No procedures performed  Lumbar Facet Joint Intra-Articular Injection(s) with Fluoroscopic Guidance  Patient: Marcus Taylor      Date of Birth: Mar 06, 1975 MRN: [id number] PCP: Howard Pouch, DO      Visit Date: 11/28/2016   Universal Protocol:    Date/Time: 04/17/185:06 PM  Consent Given By: the patient  Position: PRONE   Additional Comments: Vital signs were monitored before and after the procedure. Patient was prepped and draped in the usual sterile fashion. The correct patient, procedure, and site was  verified.   Injection Procedure Details:  Procedure Site One Meds Administered:  Meds ordered this encounter  Medications  . lidocaine (PF) (XYLOCAINE) 1 % injection 0.3 mL  . methylPREDNISolone acetate (DEPO-MEDROL) injection 80 mg     Laterality: Right  Location/Site:  L3-L4  Needle size: 22 guage  Needle type: Spinal  Needle Placement: Articular  Findings:  -Contrast Used: 0.5 mL iohexol 180 mg iodine/mL   -Comments: Excellent flow of contrast producing a partial arthrogram.  Procedure Details: The fluoroscope beam is vertically oriented in AP, and the inferior recess is visualized beneath the lower pole of the inferior apophyseal process, which represents the target point for needle insertion. When direct visualization is difficult the target point is located at the medial projection of the vertebral pedicle. The region overlying each aforementioned target is locally anesthetized with a 1 to 2 ml. volume of 1% Lidocaine without Epinephrine.   The spinal needle was inserted into each of the above mentioned facet joints using biplanar fluoroscopic guidance. A 0.25 to 0.5 ml. volume of Isovue-250 was injected and a partial facet joint arthrogram was obtained. A single spot film was obtained of the resulting arthrogram.    One to 1.25 ml of the steroid/anesthetic solution was then injected into each of the facet joints noted above.   Additional Comments:  The patient tolerated the procedure well Dressing: Band-Aid    Post-procedure details: Patient was observed during the procedure. Post-procedure instructions were reviewed.  Patient left the clinic in stable condition.  Clinical History: Lumbar spine MRI 10/10/2016 L3-L4: Severe disc space narrowing and severe facet hypertrophy. There are postsurgical transient prior laminectomies. Moderate central spinal canal stenosis. Severe bilateral neural foraminal stenosis, unchanged. A component of right subarticular  disc extrusion with inferior migration has decreased compared to the prior study.  L4-L5: Severe loss of disc space with diffuse bulge. Posterior decompression. Unchanged moderate spinal canal stenosis and severe bilateral neural foraminal stenosis.  L5-S1: Disc desiccation with diffuse disc bulge. No spinal canal stenosis. Unchanged severe bilateral neural foraminal stenosis.  Visualized sacrum: Normal.  IMPRESSION: 1. Severe lower lumbar degenerative disc disease with moderate spinal canal stenosis at L3-L4 and L4-L5, unchanged. Grade 1 listhesis of L3 on 4 and retrolisthesis of L4 on 5 2. Partial involution of right subarticular disc extrusion of L3-L4. 3. Severe bilateral L3, L4 and L5 neural foraminal stenosis, unchanged.  He reports that he quit smoking about 28 years ago. He has a 20.00 pack-year smoking history. He has never used smokeless tobacco.   Recent Labs  05/03/16 0855 08/02/16 0859 11/15/16 0855  HGBA1C 6.0 5.6 5.9    Objective:  VS:  HT:    WT:   BMI:     BP:133/74  HR:63bpm  TEMP:98.1 F (36.7 C)( )  RESP:97 % Physical Exam  Musculoskeletal:  Concordant low back pain with extension rotation of the lumbar spine. No pain over the greater trochanters and good distal strength.    Ortho Exam Imaging: Xr C-arm No Report  Result Date: 11/28/2016 Please see Notes or Procedures tab for imaging impression.   Past Medical/Family/Surgical/Social History: Medications & Allergies reviewed per EMR Patient Active Problem List   Diagnosis Date Noted  . IDA (iron deficiency anemia) 08/09/2016  . Thrombocytopenia ([place]) 05/30/2016  . Deficiency anemia 05/04/2016  . Diabetes mellitus type 2, controlled, with complications ([place]) 87/56/4332  . Right inguinal hernia 02/03/2016  . History of colon polyps 01/27/2016  . History of ST elevation myocardial infarction (STEMI) 01/27/2016  . CAD (coronary artery disease) 01/27/2016  . Hyperlipidemia 01/27/2016  .  Anemia in neoplastic disease 05/25/2014  . Splenic marginal zone b-cell lymphoma ([place]) 12/18/2011  . HTN (hypertension)    Past Medical History:  Diagnosis Date  . Anemia   . Arthritis   . Colonic polyp   . Coronary artery disease    stents x 3 (03-2014)  . Diabetes mellitus   . Diverticulosis   . HTN (hypertension)   . Hypertriglyceridemia   . Kidney stone   . Lymphocytosis   . Metatarsal fracture left   left 5th proximal phalanx   . OSA (obstructive sleep apnea)    "Mild" by records  . PONV (postoperative nausea and vomiting)   . RBBB 01/2016  . Splenic marginal zone b-cell lymphoma ([place]) 12/18/2011  . [facility]I (ST [facility])    August 2015   Family History  Problem Relation Age of Onset  . Kidney disease Sister   . Colon cancer Sister 68  . Colon cancer Brother   . Arthritis Mother   . Breast cancer Mother   . Heart disease Mother   . Bladder Cancer Brother   . Arthritis Son   . Esophageal cancer Sister    Past Surgical History:  Procedure Laterality Date  . CARDIAC CATHETERIZATION     stents x 3 (03/2014)  . CHOLECYSTECTOMY    . HERNIA REPAIR    . INGUINAL HERNIA REPAIR Right 05/27/2015   Procedure: LAPAROSCOPIC REPAIR RIGHT  INGUINAL HERNIA;  Surgeon: Greer Pickerel, MD;  Location: [facility];  Service: General;  Laterality: Right;  . INSERTION OF MESH Right 05/27/2015   Procedure: INSERTION OF MESH;  Surgeon: Greer Pickerel, MD;  Location: [facility];  Service: General;  Laterality: Right;  . LUMBAR DISC SURGERY    . TONSILLECTOMY AND ADENOIDECTOMY     Social History   Occupational History  .      used to work as an Proofreader; now retired   Social History Main Topics  . Smoking status: Former Smoker    Packs/day: 1.00    Years: 20.00    Quit date: 08/14/1988  . Smokeless tobacco: Never Used  . Alcohol use No  . Drug use: No  . Sexual activity: Yes    Birth control/ protection: None

## 2016-11-28 NOTE — Patient Instructions (Signed)

## 2016-11-28 NOTE — Procedures (Signed)
Lumbar Facet Joint Intra-Articular Injection(s) with Fluoroscopic Guidance  Patient: Marcus Taylor      Date of Birth: Nov 24, 1940 MRN: [id number] PCP: Howard Pouch, DO      Visit Date: 11/28/2016   Universal Protocol:    Date/Time: 04/17/185:06 PM  Consent Given By: the patient  Position: PRONE   Additional Comments: Vital signs were monitored before and after the procedure. Patient was prepped and draped in the usual sterile fashion. The correct patient, procedure, and site was verified.   Injection Procedure Details:  Procedure Site One Meds Administered:  Meds ordered this encounter  Medications  . lidocaine (PF) (XYLOCAINE) 1 % injection 0.3 mL  . methylPREDNISolone acetate (DEPO-MEDROL) injection 80 mg     Laterality: Right  Location/Site:  L3-L4  Needle size: 22 guage  Needle type: Spinal  Needle Placement: Articular  Findings:  -Contrast Used: 0.5 mL iohexol 180 mg iodine/mL   -Comments: Excellent flow of contrast producing a partial arthrogram.  Procedure Details: The fluoroscope beam is vertically oriented in AP, and the inferior recess is visualized beneath the lower pole of the inferior apophyseal process, which represents the target point for needle insertion. When direct visualization is difficult the target point is located at the medial projection of the vertebral pedicle. The region overlying each aforementioned target is locally anesthetized with a 1 to 2 ml. volume of 1% Lidocaine without Epinephrine.   The spinal needle was inserted into each of the above mentioned facet joints using biplanar fluoroscopic guidance. A 0.25 to 0.5 ml. volume of Isovue-250 was injected and a partial facet joint arthrogram was obtained. A single spot film was obtained of the resulting arthrogram.    One to 1.25 ml of the steroid/anesthetic solution was then injected into each of the facet joints noted above.   Additional Comments:  The patient tolerated the  procedure well Dressing: Band-Aid    Post-procedure details: Patient was observed during the procedure. Post-procedure instructions were reviewed.  Patient left the clinic in stable condition.

## 2017-01-10 ENCOUNTER — Other Ambulatory Visit: Payer: Self-pay | Admitting: *Deleted

## 2017-01-10 MED ORDER — METOPROLOL TARTRATE 25 MG PO TABS: 25.0000 mg | ORAL_TABLET | Freq: Two times a day (BID) | ORAL | 3 refills | Status: DC

## 2017-01-22 ENCOUNTER — Encounter: Payer: Self-pay | Admitting: Gastroenterology

## 2017-01-23 ENCOUNTER — Other Ambulatory Visit: Payer: Self-pay | Admitting: *Deleted

## 2017-01-23 MED ORDER — ATORVASTATIN CALCIUM 80 MG PO TABS: 80.0000 mg | ORAL_TABLET | Freq: Every morning | ORAL | 1 refills | Status: DC

## 2017-01-25 ENCOUNTER — Encounter (INDEPENDENT_AMBULATORY_CARE_PROVIDER_SITE_OTHER): Payer: Self-pay | Admitting: Orthopaedic Surgery

## 2017-01-25 ENCOUNTER — Ambulatory Visit (INDEPENDENT_AMBULATORY_CARE_PROVIDER_SITE_OTHER): Payer: Medicare Other | Admitting: Orthopaedic Surgery

## 2017-01-25 VITALS — BP 135/62 | HR 54 | Ht 70.0 in | Wt 200.0 lb

## 2017-01-25 DIAGNOSIS — M25562 Pain in left knee: Secondary | ICD-10-CM

## 2017-01-25 MED ORDER — BUPIVACAINE HCL 0.5 % IJ SOLN
3.0000 mL | INTRAMUSCULAR | Status: AC | PRN
  Administered 2017-01-25: 3 mL via INTRA_ARTICULAR

## 2017-01-25 MED ORDER — METHYLPREDNISOLONE ACETATE 40 MG/ML IJ SUSP
80.0000 mg | INTRAMUSCULAR | Status: AC | PRN
  Administered 2017-01-25: 80 mg

## 2017-01-25 MED ORDER — LIDOCAINE HCL 1 % IJ SOLN
5.0000 mL | INTRAMUSCULAR | Status: AC | PRN
  Administered 2017-01-25: 5 mL

## 2017-01-25 NOTE — Progress Notes (Signed)
Office Visit Note   Patient: Marcus Taylor           Date of Birth: 20-Dec-1940           MRN: [id number] Visit Date: 01/25/2017              Requested by: [name], DO [address] [place], [place] [address] PCP: [name], DO   Assessment & Plan: Visit Diagnoses:  1. Acute pain of left knee   Mild osteoarthritis by plain film with hemarthrosis-no history of injury or trauma  Plan: Aspirate left knee, inject with cortisone. I aspirated 26 mL of blood-tinged fluid. Patient is on Plavix. After aspiration and injection of cortisone he felt much better. Plan to see him back first of week if no improvement  Follow-Up Instructions: Return if symptoms worsen or fail to improve.   Orders:  No orders of the defined types were placed in this encounter.  No orders of the defined types were placed in this encounter.     Procedures: Large Joint Inj Date/Time: 01/25/2017 2:29 PM Performed by: Garald Balding Authorized by: Garald Balding   Consent Given by:  Patient Timeout: prior to procedure the correct patient, procedure, and site was verified   Indications:  Pain and joint swelling Location:  Knee Site:  L knee Prep: patient was prepped and draped in usual sterile fashion   Needle Size:  25 G Needle Length:  1.5 inches Approach:  Anteromedial Ultrasound Guidance: No   Fluoroscopic Guidance: No   Arthrogram: No   Medications:  5 mL lidocaine 1 %; 80 mg methylPREDNISolone acetate 40 MG/ML; 3 mL bupivacaine 0.5 % Aspiration Attempted: No   Aspirate amount (mL):  26 Aspirate:  Blood-tinged Patient tolerance:  Patient tolerated the procedure well with no immediate complications     Clinical Data: No additional findings.   Subjective: Chief Complaint  Patient presents with  . Left Leg - Weakness, Pain, Numbness    Marcus Taylor is a 76 y o that presents with chronic LBP, Knee pain and today left left and calf pain. Not hx of blood clots on Plavix.  Denies injury. Pain since 1 day  Marcus Taylor has had progressive pain in his left knee over the last several days. His knee is "tight" and pain in the posterior aspect of his leg. He was concerned that he may have a "blood clot" but has not noticed any swelling in his calf or his ankle. He has had prior films of his knee demonstrating osteoarthritis. He's doing well in terms of his low back pain after injection by Dr. Ernestina Patches  HPI  Review of Systems   Objective: Vital Signs: BP 135/62   Pulse (!) 54   Ht 5\' 10"  (1.778 m)   Wt 200 lb (90.7 kg)   BMI 28.70 kg/m   Physical Exam  Ortho Exam positive effusion left knee. Left knee was not hot warm or swollen. No calf pain induration or ecchymosis. No swelling distally. No popliteal mass. Flexion over 105 without instability but difficulty with full extension based on the effusion. Mild medial joint pain. Straight leg raise negative. Painless range of motion of both hips  Specialty Comments:  No specialty comments available.  Imaging: No results found.   PMFS History: Patient Active Problem List   Diagnosis Date Noted  . IDA (iron deficiency anemia) 08/09/2016  . Thrombocytopenia ([place]) 05/30/2016  . Deficiency anemia 05/04/2016  . Diabetes mellitus type 2,  controlled, with complications ([place]) 16/38/4536  . Right inguinal hernia 02/03/2016  . History of colon polyps 01/27/2016  . History of ST elevation myocardial infarction (STEMI) 01/27/2016  . CAD (coronary artery disease) 01/27/2016  . Hyperlipidemia 01/27/2016  . Anemia in neoplastic disease 05/25/2014  . Splenic marginal zone b-cell lymphoma ([place]) 12/18/2011  . HTN (hypertension)    Past Medical History:  Diagnosis Date  . Anemia   . Arthritis   . Colonic polyp   . Coronary artery disease    stents x 3 (03-2014)  . Diabetes mellitus   . Diverticulosis   . HTN (hypertension)   . Hypertriglyceridemia   . Kidney stone   . Lymphocytosis   . Metatarsal fracture left     left 5th proximal phalanx   . OSA (obstructive sleep apnea)    "Mild" by records  . PONV (postoperative nausea and vomiting)   . RBBB 01/2016  . Splenic marginal zone b-cell lymphoma ([place]) 12/18/2011  . STEMI (ST [facility] myocardial infarction) [facility])    August 2015    Family History  Problem Relation Age of Onset  . Kidney disease Sister   . Colon cancer Sister 68  . Colon cancer Brother   . Arthritis Mother   . Breast cancer Mother   . Heart disease Mother   . Bladder Cancer Brother   . Arthritis Son   . Esophageal cancer Sister     Past Surgical History:  Procedure Laterality Date  . CARDIAC CATHETERIZATION     stents x 3 (03/2014)  . CHOLECYSTECTOMY    . HERNIA REPAIR    . INGUINAL HERNIA REPAIR Right 05/27/2015   Procedure: LAPAROSCOPIC REPAIR RIGHT  INGUINAL HERNIA;  Surgeon: Greer Pickerel, MD;  [facility];  Service: General;  Laterality: Right;  . INSERTION OF MESH Right 05/27/2015   Procedure: INSERTION OF MESH;  Surgeon: Greer Pickerel, MD;  Location: [facility];  Service: General;  Laterality: Right;  . LUMBAR DISC SURGERY    . TONSILLECTOMY AND ADENOIDECTOMY     Social History   Occupational History  .      used to work as an Proofreader; now retired   Social History Main Topics  . Smoking status: Former Smoker    Packs/day: 1.00    Years: 20.00    Quit date: 08/14/1988  . Smokeless tobacco: Never Used  . Alcohol use No  . Drug use: No  . Sexual activity: Yes    Birth control/ protection: None     Garald Balding, MD   Note - This record has been created using [employer].  Chart creation errors have been sought, but may not always  have been located. Such creation errors do not reflect on  the standard of medical care.

## 2017-02-16 ENCOUNTER — Ambulatory Visit: Payer: Medicare Other | Admitting: Podiatry

## 2017-02-19 ENCOUNTER — Other Ambulatory Visit: Payer: Self-pay | Admitting: *Deleted

## 2017-02-19 MED ORDER — METOPROLOL TARTRATE 25 MG PO TABS: 25.0000 mg | ORAL_TABLET | Freq: Two times a day (BID) | ORAL | 0 refills | Status: DC

## 2017-02-22 ENCOUNTER — Other Ambulatory Visit: Payer: Self-pay | Admitting: *Deleted

## 2017-02-22 ENCOUNTER — Telehealth: Payer: Self-pay | Admitting: Family Medicine

## 2017-02-22 MED ORDER — CLOPIDOGREL BISULFATE 75 MG PO TABS: 75.0000 mg | ORAL_TABLET | Freq: Every day | ORAL | 2 refills | Status: DC

## 2017-02-22 NOTE — Telephone Encounter (Signed)
Patient states he has been out of plavix x 3 days now, has contacted his [facility] multiple times and they stated they have sent multiple refill requests.  Please adivse, patient needs a call back as well.  Thank You.

## 2017-02-22 NOTE — Telephone Encounter (Signed)
We received request this am and refill was sent to patient pharmacy.notified patient he states he will call his pharmacy to see if they have processed it.

## 2017-02-23 ENCOUNTER — Encounter: Payer: Self-pay | Admitting: Podiatry

## 2017-02-23 ENCOUNTER — Telehealth (INDEPENDENT_AMBULATORY_CARE_PROVIDER_SITE_OTHER): Payer: Self-pay | Admitting: Orthopaedic Surgery

## 2017-02-23 ENCOUNTER — Ambulatory Visit (INDEPENDENT_AMBULATORY_CARE_PROVIDER_SITE_OTHER): Payer: Medicare Other | Admitting: Podiatry

## 2017-02-23 DIAGNOSIS — B351 Tinea unguium: Secondary | ICD-10-CM

## 2017-02-23 DIAGNOSIS — M79676 Pain in unspecified toe(s): Secondary | ICD-10-CM

## 2017-02-23 NOTE — Progress Notes (Signed)
Subjective: 75 y.o. returns the office today for painful, elongated, thickened toenails which he cannot trim himself. Denies any redness or drainage around the nails. Denies any acute changes since last appointment and no new complaints today. Denies any systemic complaints such as fevers, chills, nausea, vomiting.   Objective: AAO 3, NAD DP/PT pulses palpable, CRT less than 3 seconds Nails hypertrophic, dystrophic, elongated, brittle, discolored 10. There is tenderness overlying the nails 1-5 bilaterally. There is no surrounding erythema or drainage along the nail sites. Hammertoes are present.  No open lesions or pre-ulcerative lesions are identified. No other areas of tenderness bilateral lower extremities. No overlying edema, erythema, increased warmth. No pain with calf compression, swelling, warmth, erythema.  Assessment: Patient presents with symptomatic onychomycosis  Plan: -Treatment options including alternatives, risks, complications were discussed -Nails sharply debrided 10 without complication/bleeding. -Discussed daily foot inspection. If there are any changes, to call the office immediately.  -Follow-up in 3 months or sooner if any problems are to arise. In the meantime, encouraged to call the office with any questions, concerns, changes symptoms.  Asencion Loveday, DPM  

## 2017-02-23 NOTE — Telephone Encounter (Signed)
Patient called this morning wanting to get fit into Dr. Rudene Anda schedule.  He is having trouble with his left leg and is unable to walk.  CB#[phone]

## 2017-02-28 ENCOUNTER — Telehealth: Payer: Self-pay | Admitting: *Deleted

## 2017-02-28 ENCOUNTER — Encounter: Payer: Self-pay | Admitting: *Deleted

## 2017-02-28 NOTE — Telephone Encounter (Signed)
Message sent in [facility] Chart to call and schedule Medicare Wellness Visit

## 2017-03-05 ENCOUNTER — Telehealth: Payer: Self-pay

## 2017-03-05 ENCOUNTER — Ambulatory Visit (INDEPENDENT_AMBULATORY_CARE_PROVIDER_SITE_OTHER): Payer: Medicare Other | Admitting: Gastroenterology

## 2017-03-05 ENCOUNTER — Encounter: Payer: Self-pay | Admitting: Gastroenterology

## 2017-03-05 VITALS — BP 118/60 | HR 60 | Ht 72.0 in | Wt 202.5 lb

## 2017-03-05 DIAGNOSIS — D509 Iron deficiency anemia, unspecified: Secondary | ICD-10-CM | POA: Diagnosis not present

## 2017-03-05 DIAGNOSIS — Z800 Family history of malignant neoplasm of digestive organs: Secondary | ICD-10-CM | POA: Diagnosis not present

## 2017-03-05 DIAGNOSIS — Z7902 Long term (current) use of antithrombotics/antiplatelets: Secondary | ICD-10-CM

## 2017-03-05 MED ORDER — NA SULFATE-K SULFATE-MG SULF 17.5-3.13-1.6 GM/177ML PO SOLN: 1.0000 | Freq: Once | ORAL | 0 refills | Status: AC

## 2017-03-05 NOTE — Telephone Encounter (Signed)
RE: Marcus Taylor DOB: 1941-06-21 MRN: [id number]   Dear Dr. [name],    We have scheduled the above patient for an endoscopic procedure. Our records show that he is on anticoagulation therapy.   Please advise as to how long the patient may come off his therapy of Plavix prior to the procedure, which is scheduled for 04/18/17.  Please fax back/ or route the completed form to [facility] at [phone].   Thank Ulanda Edison, LPN

## 2017-03-05 NOTE — Patient Instructions (Signed)
If you are age 76 or older, your body mass index should be between 23-30. Your Body mass index is 27.46 kg/m. If this is out of the aforementioned range listed, please consider follow up with your Primary Care Provider.  If you are age 55 or younger, your body mass index should be between 19-25. Your Body mass index is 27.46 kg/m. If this is out of the aformentioned range listed, please consider follow up with your Primary Care Provider.   We have sent the following medications to your pharmacy for you to pick up at your convenience:  [place] have been scheduled for an endoscopy and colonoscopy. Please follow the written instructions given to you at your visit today. Please pick up your prep supplies at the pharmacy within the next 1-3 days. If you use inhalers (even only as needed), please bring them with you on the day of your procedure. Your physician has requested that you go to www.startemmi.com and enter the access code given to you at your visit today. This web site gives a general overview about your procedure. However, you should still follow specific instructions given to you by our office regarding your preparation for the procedure.  You will be contacted by our office prior to your procedure for directions on holding your Plavix.  If you do not hear from our office 1 week prior to your scheduled procedure, please call [phone] to discuss.   You may continue your Aspirin through out the procedure.  We will contact you if a sooner/earlier appointment becomes available.  Thank you.

## 2017-03-05 NOTE — Progress Notes (Signed)
HPI :  76 y/o male with a history of MI 79 - CAD on plavix for 3 stents in place, here for a visit to discuss colon cancer screening.  He has not been to our office since 2013.  FH of colon cancer - sister had colon cancer, diagnosed around age 26s or so.  He denies any blood in the stools. No trouble with his bowels. No dysphagia. No eating problems. No nausea or vomiting. No abdominal pains. He denies shortness of breath or chest pains - Dr. [name] is cardiologist at [facility].  Echo 11/19/2015 showed EF of 60-65%.  He has been noted to have an iron deficiency anemia on labs in Jan 2018, he reports this has been ongoing for a while. He has been on iron tablets and has not needed IV infusion. He denies prior upper endoscopy.   Colonoscopy 12/19/2011 - diverticulosis, hemorrhoids, no polyps Colonoscopy 01/08/2007 - solitary ulcer ascending colon, diverticulosis, hemorrhoids, no polyps History of polyps remotely  Past Medical History:  Diagnosis Date  . Anemia   . Arthritis   . Colonic polyp   . Coronary artery disease    stents x 3 (03-2014)  . Diabetes mellitus   . Diverticulosis   . HTN (hypertension)   . Hypertriglyceridemia   . Kidney stone   . Lymphocytosis   . Metatarsal fracture left   left 5th proximal phalanx   . OSA (obstructive sleep apnea)    "Mild" by records  . PONV (postoperative nausea and vomiting)   . RBBB 01/2016  . Splenic marginal zone b-cell lymphoma ([place]) 12/18/2011  . STEMI (ST elevation myocardial infarction) [facility])    August 2015     Past Surgical History:  Procedure Laterality Date  . CARDIAC CATHETERIZATION     stents x 3 (03/2014)  . CHOLECYSTECTOMY    . HERNIA REPAIR    . INGUINAL HERNIA REPAIR Right 05/27/2015   Procedure: LAPAROSCOPIC REPAIR RIGHT  INGUINAL HERNIA;  Surgeon: Greer Pickerel, MD;  Location: [facility];  Service: General;  Laterality: Right;  . INSERTION OF MESH Right 05/27/2015   Procedure: INSERTION OF MESH;  Surgeon: Greer Pickerel,  MD;  Location: [facility];  Service: General;  Laterality: Right;  . LUMBAR DISC SURGERY    . TONSILLECTOMY AND ADENOIDECTOMY     Family History  Problem Relation Age of Onset  . Kidney disease Sister   . Colon cancer Sister 50  . Colon cancer Brother   . Arthritis Mother   . Breast cancer Mother   . Heart disease Mother   . Bladder Cancer Brother   . Arthritis Son   . Esophageal cancer Sister    Social History  Substance Use Topics  . Smoking status: Former Smoker    Packs/day: 1.00    Years: 20.00    Quit date: 08/14/1988  . Smokeless tobacco: Never Used  . Alcohol use No   Current Outpatient Prescriptions  Medication Sig Dispense Refill  . aspirin EC 81 MG tablet Take 81 mg by mouth every morning.     Marland Kitchen atorvastatin (LIPITOR) 80 MG tablet Take 1 tablet (80 mg total) by mouth every morning. 90 tablet 1  . clopidogrel (PLAVIX) 75 MG tablet Take 1 tablet (75 mg total) by mouth daily. 30 tablet 2  . ferrous sulfate 325 (65 FE) MG tablet Take 325 mg by mouth daily with breakfast.    . lisinopril (PRINIVIL,ZESTRIL) 20 MG tablet Take 1 tablet (20 mg total) by mouth daily.  90 tablet 1  . metFORMIN (GLUCOPHAGE-XR) 500 MG 24 hr tablet TAKE 1 TABLET BY MOUTH WITH EVENING MEAL 90 tablet 1  . metoprolol tartrate (LOPRESSOR) 25 MG tablet Take 1 tablet (25 mg total) by mouth 2 (two) times daily. Needs office visit prior to anymore refills 180 tablet 0  . nitroGLYCERIN (NITROSTAT) 0.4 MG SL tablet Place 0.4 mg under the tongue every 5 (five) minutes as needed for chest pain.     No current facility-administered medications for this visit.    No Known Allergies   Review of Systems: All systems reviewed and negative except where noted in HPI.   Lab Results  Component Value Date   WBC 10.2 08/28/2016   HGB 11.8 (L) 08/28/2016   HCT 37.1 (L) 08/28/2016   MCV 80.8 08/28/2016   PLT 137 (L) 08/28/2016    Lab Results  Component Value Date   CREATININE 0.85 11/15/2016   BUN 15 11/15/2016     NA 141 11/15/2016   K 4.4 11/15/2016   CL 106 11/15/2016   CO2 21 11/15/2016    Lab Results  Component Value Date   ALT 18 05/03/2016   AST 30 05/03/2016   ALKPHOS 69 05/03/2016   BILITOT 0.4 05/03/2016    Lab Results  Component Value Date   IRON 25 (L) 08/02/2016   TIBC 342 08/02/2016   FERRITIN 11 (L) 08/28/2016     Physical Exam: BP 118/60   Pulse 60   Ht 6' (1.829 m)   Wt 202 lb 8 oz (91.9 kg)   BMI 27.46 kg/m  Constitutional: Pleasant,well-developed, male in no acute distress. HEENT: Normocephalic and atraumatic.  No scleral icterus. Neck supple.  Cardiovascular: Normal rate, regular rhythm.  Pulmonary/chest: Effort normal and breath sounds normal. No wheezing, rales or rhonchi. Abdominal: Soft, nondistended, nontender.  There are no masses palpable. No hepatomegaly. Extremities: no edema Lymphadenopathy: No cervical adenopathy noted. Neurological: Alert and oriented to person place and time. Skin: Skin is warm and dry. No rashes noted. Psychiatric: Normal mood and affect. Behavior is normal.   ASSESSMENT AND PLAN: 76 year old male with history as above here to discuss colonoscopy given strong family history of colon cancer. He incidentally is noted to have an iron deficiency anemia on chronic Plavix.   I discussed differential diagnosis for iron deficiency anemia with him. For this issue I'm recommending an upper and lower endoscopy. I discussed endoscopy and anesthesia with him and he wanted to proceed. We will ask him to hold his Plavix for 5 days prior to the procedure and obtain approval from his prescribing provider to do this. He can continue aspirin during this time.  He was in agreement with the plan as outlined. All questions answered  Zayante Cellar, MD [facility] Pager [phone]  CC: [name], DO

## 2017-03-13 ENCOUNTER — Other Ambulatory Visit (INDEPENDENT_AMBULATORY_CARE_PROVIDER_SITE_OTHER): Payer: Medicare Other

## 2017-03-13 DIAGNOSIS — E118 Type 2 diabetes mellitus with unspecified complications: Secondary | ICD-10-CM | POA: Diagnosis not present

## 2017-03-13 DIAGNOSIS — I10 Essential (primary) hypertension: Secondary | ICD-10-CM | POA: Diagnosis not present

## 2017-03-13 DIAGNOSIS — E785 Hyperlipidemia, unspecified: Secondary | ICD-10-CM

## 2017-03-13 LAB — TSH: TSH: 1.84 u[IU]/mL (ref 0.35–4.50)

## 2017-03-13 LAB — LIPID PANEL
CHOLESTEROL: 82 mg/dL (ref 0–200)
HDL: 25.5 mg/dL — ABNORMAL LOW (ref >39.00)
LDL Cholesterol: 37 mg/dL (ref 0–99)
NONHDL: 56.01
Total CHOL/HDL Ratio: 3
Triglycerides: 93 mg/dL (ref 0.0–149.0)
VLDL: 18.6 mg/dL (ref 0.0–40.0)

## 2017-03-14 ENCOUNTER — Telehealth: Payer: Self-pay

## 2017-03-14 LAB — COMPLETE METABOLIC PANEL WITH GFR
ALBUMIN: 4.1 g/dL (ref 3.6–5.1)
ALT: 19 U/L (ref 9–46)
AST: 29 U/L (ref 10–35)
Alkaline Phosphatase: 75 U/L (ref 40–115)
BUN: 15 mg/dL (ref 7–25)
CO2: 27 mmol/L (ref 20–31)
Calcium: 8.8 mg/dL (ref 8.6–10.3)
Chloride: 107 mmol/L (ref 98–110)
Creat: 0.86 mg/dL (ref 0.70–1.18)
GFR, EST NON AFRICAN AMERICAN: 85 mL/min (ref >=60)
GLUCOSE: 104 mg/dL — AB (ref 65–99)
Potassium: 4.8 mmol/L (ref 3.5–5.3)
Sodium: 142 mmol/L (ref 135–146)
TOTAL PROTEIN: 5.8 g/dL — AB (ref 6.1–8.1)
Total Bilirubin: 0.5 mg/dL (ref 0.2–1.2)

## 2017-03-14 LAB — MICROALBUMIN / CREATININE URINE RATIO
CREATININE, URINE: 89 mg/dL (ref 20–370)
MICROALB UR: 4.7 mg/dL
Microalb Creat Ratio: 53 mcg/mg creat — ABNORMAL HIGH (ref <30)

## 2017-03-14 LAB — HEMOGLOBIN A1C
Hgb A1c MFr Bld: 5.9 % — ABNORMAL HIGH (ref <5.7)
Mean Plasma Glucose: 123 mg/dL

## 2017-03-14 NOTE — Telephone Encounter (Signed)
Per Dr. [name] pt may hold his Plavix 5 days prior to his endoscopic procedure. He should resume the medication 2 days after procedure.  Letter sent to be scanned.

## 2017-03-16 ENCOUNTER — Encounter: Payer: Medicare Other | Admitting: Family Medicine

## 2017-03-19 ENCOUNTER — Encounter: Payer: Self-pay | Admitting: Family Medicine

## 2017-03-19 ENCOUNTER — Ambulatory Visit (INDEPENDENT_AMBULATORY_CARE_PROVIDER_SITE_OTHER): Payer: Medicare Other | Admitting: Family Medicine

## 2017-03-19 VITALS — BP 134/74 | HR 58 | Temp 97.6°F | Resp 20 | Ht 72.0 in | Wt 204.0 lb

## 2017-03-19 DIAGNOSIS — E785 Hyperlipidemia, unspecified: Secondary | ICD-10-CM | POA: Diagnosis not present

## 2017-03-19 DIAGNOSIS — Z0000 Encounter for general adult medical examination without abnormal findings: Secondary | ICD-10-CM

## 2017-03-19 DIAGNOSIS — Z0001 Encounter for general adult medical examination with abnormal findings: Secondary | ICD-10-CM | POA: Diagnosis not present

## 2017-03-19 MED ORDER — METFORMIN HCL ER 500 MG PO TB24: 500.0000 mg | ORAL_TABLET | Freq: Every day | ORAL | 1 refills | Status: DC

## 2017-03-19 MED ORDER — ATORVASTATIN CALCIUM 40 MG PO TABS: 40.0000 mg | ORAL_TABLET | Freq: Every day | ORAL | 3 refills | Status: DC

## 2017-03-19 MED ORDER — CLOPIDOGREL BISULFATE 75 MG PO TABS: 75.0000 mg | ORAL_TABLET | Freq: Every day | ORAL | 3 refills | Status: DC

## 2017-03-19 NOTE — Progress Notes (Signed)
Patient ID: Marcus Taylor, male  DOB: [birth year], 76 y.o.   MRN: [id number] Patient Care Team    Relationship Specialty Notifications Start End  [name], DO PCP - General Family Medicine  01/26/16   [name], MD Consulting Physician Gastroenterology  01/26/16   [name], MD Consulting Physician Hematology and Oncology  01/26/16   [name], MD Referring Physician Cardiology  01/26/16   [name], MD Consulting Physician Neurosurgery  01/27/16   [name], OD Referring Physician   05/03/16    Comment: opthalmology     Chief Complaint  Patient presents with  . Annual Exam    Subjective:  Marcus Taylor is a 76 y.o. male present for CPE. All past medical history, surgical history, allergies, family history, immunizations, medications and social history were updated in the electronic medical record today. All recent labs, ED visits and hospitalizations within the last year were reviewed. Reviewed all lab results with patient today, he had pre-labs collected.  Health maintenance:  Colonoscopy: last screen 12/19/2011, recommend follow up 5 year. Completed by Dr. Deatra Ina. Patient has scheduled at the end of this month. Immunizations:  tdap UTD 2015, influenza UTD 2017 (encouraged yearly), PNA series completed. Assistive device: None Oxygen use: None Patient has a Dental home. Hospitalizations/ED visits: None  Depression screen [facility] 2/9 03/19/2017 01/24/2016  Decreased Interest 0 0  Down, Depressed, Hopeless 0 -  PHQ - 2 Score 0 0   No flowsheet data found.   Current Exercise Habits: Home exercise routine, Type of exercise: walking, Time (Minutes): 30, Frequency (Times/Week): 3, Weekly Exercise (Minutes/Week): 90, Intensity: Mild   Fall Risk  03/19/2017 01/24/2016 05/25/2014  Falls in the past year? No No No      Immunization History  Administered Date(s) Administered  . Influenza Split 05/07/2012  . Influenza, High Dose Seasonal PF 05/03/2016    . Influenza,inj,Quad PF,36+ Mos 09/22/2013, 05/25/2014, 05/31/2015  . Pneumococcal Conjugate-13 09/28/2014  . Pneumococcal Polysaccharide-23 10/05/2011  . Tdap 03/24/2014     Past Medical History:  Diagnosis Date  . Anemia   . Arthritis   . Colonic polyp   . Coronary artery disease    stents x 3 (03-2014)  . Diabetes mellitus   . Diverticulosis   . HTN (hypertension)   . Hypertriglyceridemia   . Kidney stone   . Lymphocytosis   . Metatarsal fracture left   left 5th proximal phalanx   . OSA (obstructive sleep apnea)    "Mild" by records  . PONV (postoperative nausea and vomiting)   . RBBB 01/2016  . Splenic marginal zone b-cell lymphoma ([place]) 12/18/2011  . STEMI (ST elevation myocardial infarction) [facility])    August 2015   No Known Allergies Past Surgical History:  Procedure Laterality Date  . CARDIAC CATHETERIZATION     stents x 3 (03/2014)  . CHOLECYSTECTOMY    . HERNIA REPAIR    . INGUINAL HERNIA REPAIR Right 05/27/2015   Procedure: LAPAROSCOPIC REPAIR RIGHT  INGUINAL HERNIA;  Surgeon: Greer Pickerel, MD;  Location: [facility];  Service: General;  Laterality: Right;  . INSERTION OF MESH Right 05/27/2015   Procedure: INSERTION OF MESH;  Surgeon: Greer Pickerel, MD;  Location: [facility];  Service: General;  Laterality: Right;  . LUMBAR DISC SURGERY    . TONSILLECTOMY AND ADENOIDECTOMY     Family History  Problem Relation Age of Onset  . Kidney disease Sister   . Colon cancer Sister 37  .  Colon cancer Brother   . Arthritis Mother   . Breast cancer Mother   . Heart disease Mother   . Bladder Cancer Brother   . Arthritis Son   . Esophageal cancer Sister    Social History   Social History  . Marital status: Married    Spouse name: N/A  . Number of children: 2  . Years of education: N/A   Occupational History  .      used to work as an Proofreader; now retired   Social History Main Topics  . Smoking status: Former Smoker    Packs/day: 1.00    Years: 20.00    Quit  date: 08/14/1988  . Smokeless tobacco: Never Used  . Alcohol use No  . Drug use: No  . Sexual activity: Yes    Birth control/ protection: None   Other Topics Concern  . Not on file   Social History Narrative   Married to [place].    HS education. Retired.    Former smoker. No drugs or ETOH.   Wears seatbelt.   Wears dentures.   Smoke detector in the home. Firearms  In the home.    Feels safe in his relationships.    Allergies as of 03/19/2017   No Known Allergies     Medication List       Accurate as of 03/19/17 11:59 PM. Always use your most recent med list.          aspirin EC 81 MG tablet Take 81 mg by mouth every morning.   atorvastatin 40 MG tablet Commonly known as:  LIPITOR Take 1 tablet (40 mg total) by mouth daily.   clopidogrel 75 MG tablet Commonly known as:  PLAVIX Take 1 tablet (75 mg total) by mouth daily.   ferrous sulfate 325 (65 FE) MG tablet Take 325 mg by mouth daily with breakfast.   lisinopril 20 MG tablet Commonly known as:  PRINIVIL,ZESTRIL Take 1 tablet (20 mg total) by mouth daily.   metFORMIN 500 MG 24 hr tablet Commonly known as:  GLUCOPHAGE-XR Take 1 tablet (500 mg total) by mouth daily with breakfast. TAKE 1 TABLET BY MOUTH WITH EVENING MEAL M-W-F   metoprolol tartrate 25 MG tablet Commonly known as:  LOPRESSOR Take 1 tablet (25 mg total) by mouth 2 (two) times daily. Needs office visit prior to anymore refills   nitroGLYCERIN 0.4 MG SL tablet Commonly known as:  NITROSTAT Place 0.4 mg under the tongue every 5 (five) minutes as needed for chest pain.      All past medical history, surgical history, allergies, family history, immunizations andmedications were updated in the EMR today and reviewed under the history and medication portions of their EMR.     Recent Results (from the past 2160 hour(s))  COMPLETE METABOLIC PANEL WITH GFR     Status: Abnormal   Collection Time: 03/13/17  7:59 AM  Result Value Ref Range   Sodium 142  135 - 146 mmol/L   Potassium 4.8 3.5 - 5.3 mmol/L   Chloride 107 98 - 110 mmol/L   CO2 27 20 - 31 mmol/L   Glucose, Bld 104 (H) 65 - 99 mg/dL   BUN 15 7 - 25 mg/dL   Creat 0.86 0.70 - 1.18 mg/dL    Comment:   For patients > or = 75 years of age: The upper reference limit for Creatinine is approximately 13% higher for people identified as African-American.      Total Bilirubin  0.5 0.2 - 1.2 mg/dL   Alkaline Phosphatase 75 40 - 115 U/L   AST 29 10 - 35 U/L   ALT 19 9 - 46 U/L   Total Protein 5.8 (L) 6.1 - 8.1 g/dL   Albumin 4.1 3.6 - 5.1 g/dL   Calcium 8.8 8.6 - 10.3 mg/dL   GFR, Est African American >89 >=60 mL/min   GFR, Est Non African American 85 >=60 mL/min  TSH     Status: None   Collection Time: 03/13/17  7:59 AM  Result Value Ref Range   TSH 1.84 0.35 - 4.50 uIU/mL  Hemoglobin A1c     Status: Abnormal   Collection Time: 03/13/17  7:59 AM  Result Value Ref Range   Hgb A1c MFr Bld 5.9 (H) <5.7 %    Comment:   For someone without known diabetes, a hemoglobin A1c value between 5.7% and 6.4% is consistent with prediabetes and should be confirmed with a follow-up test.   For someone with known diabetes, a value <7% indicates that their diabetes is well controlled. A1c targets should be individualized based on duration of diabetes, age, co-morbid conditions and other considerations.   This assay result is consistent with an increased risk of diabetes.   Currently, no consensus exists regarding use of hemoglobin A1c for diagnosis of diabetes in children.      Mean Plasma Glucose 123 mg/dL  Lipid panel     Status: Abnormal   Collection Time: 03/13/17  7:59 AM  Result Value Ref Range   Cholesterol 82 0 - 200 mg/dL    Comment: ATP III Classification       Desirable:  < 200 mg/dL               Borderline High:  200 - 239 mg/dL          High:  > = 240 mg/dL   Triglycerides 93.0 0.0 - 149.0 mg/dL    Comment: Normal:  <150 mg/dLBorderline High:  150 - 199 mg/dL   HDL  25.50 (L) >39.00 mg/dL   VLDL 18.6 0.0 - 40.0 mg/dL   LDL Cholesterol 37 0 - 99 mg/dL   Total CHOL/HDL Ratio 3     Comment:                Men          Women1/2 Average Risk     3.4          3.3Average Risk          5.0          4.42X Average Risk          9.6          7.13X Average Risk          15.0          11.0                       NonHDL 56.01     Comment: NOTE:  Non-HDL goal should be 30 mg/dL higher than patient's LDL goal (i.e. LDL goal of < 70 mg/dL, would have non-HDL goal of < 100 mg/dL)  Microalbumin / creatinine urine ratio     Status: Abnormal   Collection Time: 03/13/17  8:05 AM  Result Value Ref Range   Creatinine, Urine 89 20 - 370 mg/dL   Microalb, Ur 4.7 Not estab mg/dL   Microalb Creat Ratio 53 (H) <30 mcg/mg creat  Comment: The ADA has defined abnormalities in albumin excretion as follows:           Category           Result                            (mcg/mg creatinine)                 Normal:    <30       Microalbuminuria:    30 - 299   Clinical albuminuria:    > or = 300   The ADA recommends that at least two of three specimens collected within a 3 - 6 month period be abnormal before considering a patient to be within a diagnostic category.       Mr Lumbar Spine W/o Contrast  Result Date: 10/10/2016 CLINICAL DATA:  Low back pain. EXAM: MRI LUMBAR SPINE WITHOUT CONTRAST TECHNIQUE: Multiplanar, multisequence MR imaging of the lumbar spine was performed. No intravenous contrast was administered. COMPARISON:  Lumbar spine MRI 02/20/2008 Lumbar spine radiograph 09/22/2016 FINDINGS: Segmentation:  Normal Alignment: Grade 1 anterolisthesis at L3-L4 and grade 1 retrolisthesis of L4-L5, unchanged Vertebrae: Unchanged heterogeneous marrow signal with hemangioma in the L3 vertebral body. Conus medullaris: Extends to the L1 level and appears normal. Paraspinal and other soft tissues: Right renal cysts measure up to 1.4 cm. Otherwise unremarkable. Disc levels: T12-L1:  Evaluated on sagittal images only. Inferior T12 and superior L1 endplate Schmorl's nodes. Small disc bulge without spinal canal or neural foraminal stenosis. L1-L2: Small central disc protrusion without spinal canal or neural foraminal stenosis. This finding is new from the prior study. L2-L3: Small central disc protrusion and annular fissure, unchanged. Mild narrowing of spinal canal. No neural foraminal stenosis. L3-L4: Severe disc space narrowing and severe facet hypertrophy. There are postsurgical transient prior laminectomies. Moderate central spinal canal stenosis. Severe bilateral neural foraminal stenosis, unchanged. A component of right subarticular disc extrusion with inferior migration has decreased compared to the prior study. L4-L5: Severe loss of disc space with diffuse bulge. Posterior decompression. Unchanged moderate spinal canal stenosis and severe bilateral neural foraminal stenosis. L5-S1: Disc desiccation with diffuse disc bulge. No spinal canal stenosis. Unchanged severe bilateral neural foraminal stenosis. Visualized sacrum: Normal. IMPRESSION: 1. Severe lower lumbar degenerative disc disease with moderate spinal canal stenosis at L3-L4 and L4-L5, unchanged. 2. Partial involution of right subarticular disc extrusion of L3-L4. 3. Severe bilateral L3, L4 and L5 neural foraminal stenosis, unchanged. Electronically Signed   By: Ulyses Jarred M.D.   On: 10/10/2016 14:22     ROS: 14 pt review of systems performed and negative (unless mentioned in an HPI)  Objective: BP 134/74 (BP Location: Right Arm, Cuff Size: Normal)   Pulse (!) 58   Temp 97.6 F (36.4 C)   Resp 20   Ht 6' (1.829 m)   Wt 204 lb (92.5 kg)   SpO2 97%   BMI 27.67 kg/m  Gen: Afebrile. No acute distress. Nontoxic in appearance, well-developed, well-nourished,  very pleasant Caucasian male. HENT: AT. [place]. Bilateral TM visualized and normal in appearance, normal external auditory canal. MMM, no oral lesions, adequate  dentition. Bilateral nares within normal limits. Throat without erythema, ulcerations or exudates. No Cough on exam, no hoarseness on exam. Eyes:Pupils Equal Round Reactive to light, Extraocular movements intact,  Conjunctiva without redness, discharge or icterus. Neck/lymp/endocrine: Supple, no lymphadenopathy, no thyromegaly CV: RRR no murmur appreciated, no  edema, +2/4 P posterior tibialis pulses. No carotid bruits. No JVD. Chest: CTAB, no wheeze, rhonchi or crackles. Normal Respiratory effort. Good Air movement. Abd: Soft. Flat. NTND. BS present. No Masses palpated. No hepatosplenomegaly. No rebound tenderness or guarding. Skin: No rashes, purpura or petechiae. Warm and well-perfused. Skin intact. Neuro/Msk:  Normal gait. PERLA. EOMi. Alert. Oriented x3.  Cranial nerves II through XII intact. Muscle strength 5/5 upper/lower extremity. DTRs equal bilaterally. Psych: Normal affect, dress and demeanor. Normal speech. Normal thought content and judgment.  No exam data present  Assessment/plan: Marcus Taylor is a 76 y.o. male present for CPE Encounter for preventive health examination Patient was encouraged to exercise greater than 150 minutes a week. Patient was encouraged to choose a diet filled with fresh fruits and vegetables, and lean meats. AVS provided to patient today for education/recommendation on gender specific health and safety maintenance. UTD with all immunization desired and screenings. He does have his colonoscopy set up for the end of this month for his 5 year recall with Dr. Deatra Ina. Hyperlipidemia: His cholesterol panel is actually rather low with the atorvastatin 80 mg daily. I have encouraged him to cut back the atorvastatin to 40 mg and we will follow-up in about 6 months on a repeat panel for him.  Continue routine follow-ups for diabetes/hypertension/CAD/hyperlipidemia  Return in about 1 year (around 03/19/2018) for CPE.  Note is dictated utilizing voice recognition  software. Although note has been proof read prior to signing, occasional typographical errors still can be missed. If any questions arise, please do not hesitate to call for verification.  Electronically signed by: Howard Pouch, DO [place]

## 2017-03-19 NOTE — Patient Instructions (Addendum)
Health Maintenance, Male A healthy lifestyle and preventive care is important for your health and wellness. Ask your health care provider about what schedule of regular examinations is right for you. What should I know about weight and diet? Eat a Healthy Diet  Eat plenty of vegetables, fruits, whole grains, low-fat dairy products, and lean protein.  Do not eat a lot of foods high in solid fats, added sugars, or salt.  Maintain a Healthy Weight Regular exercise can help you achieve or maintain a healthy weight. You should:  Do at least 150 minutes of exercise each week. The exercise should increase your heart rate and make you sweat (moderate-intensity exercise).  Do strength-training exercises at least twice a week.  Watch Your Levels of Cholesterol and Blood Lipids  Have your blood tested for lipids and cholesterol every 5 years starting at 76 years of age. If you are at high risk for heart disease, you should start having your blood tested when you are 76 years old. You may need to have your cholesterol levels checked more often if: ? Your lipid or cholesterol levels are high. ? You are older than 76 years of age. ? You are at high risk for heart disease.  What should I know about cancer screening? Many types of cancers can be detected early and may often be prevented. Lung Cancer  You should be screened every year for lung cancer if: ? You are a current smoker who has smoked for at least 30 years. ? You are a former smoker who has quit within the past 15 years.  Talk to your health care provider about your screening options, when you should start screening, and how often you should be screened.  Colorectal Cancer  Routine colorectal cancer screening usually begins at 76 years of age and should be repeated every 5-10 years until you are 75 years old. You may need to be screened more often if early forms of precancerous polyps or small growths are found. Your health care provider  may recommend screening at an earlier age if you have risk factors for colon cancer.  Your health care provider may recommend using home test kits to check for hidden blood in the stool.  A small camera at the end of a tube can be used to examine your colon (sigmoidoscopy or colonoscopy). This checks for the earliest forms of colorectal cancer.  Prostate and Testicular Cancer  Depending on your age and overall health, your health care provider may do certain tests to screen for prostate and testicular cancer.  Talk to your health care provider about any symptoms or concerns you have about testicular or prostate cancer.  Skin Cancer  Check your skin from head to toe regularly.  Tell your health care provider about any new moles or changes in moles, especially if: ? There is a change in a mole's size, shape, or color. ? You have a mole that is larger than a pencil eraser.  Always use sunscreen. Apply sunscreen liberally and repeat throughout the day.  Protect yourself by wearing long sleeves, pants, a wide-brimmed hat, and sunglasses when outside.  What should I know about heart disease, diabetes, and high blood pressure?  If you are 18-39 years of age, have your blood pressure checked every 3-5 years. If you are 40 years of age or older, have your blood pressure checked every year. You should have your blood pressure measured twice-once when you are at a hospital or clinic, and once when   you are not at a hospital or clinic. Record the average of the two measurements. To check your blood pressure when you are not at a hospital or clinic, you can use: ? An automated blood pressure machine at a pharmacy. ? A home blood pressure monitor.  Talk to your health care provider about your target blood pressure.  If you are between 20-79 years old, ask your health care provider if you should take aspirin to prevent heart disease.  Have regular diabetes screenings by checking your fasting blood  sugar level. ? If you are at a normal weight and have a low risk for diabetes, have this test once every three years after the age of 64. ? If you are overweight and have a high risk for diabetes, consider being tested at a younger age or more often.  A one-time screening for abdominal aortic aneurysm (AAA) by ultrasound is recommended for men aged 93-75 years who are current or former smokers. What should I know about preventing infection? Hepatitis B If you have a higher risk for hepatitis B, you should be screened for this virus. Talk with your health care provider to find out if you are at risk for hepatitis B infection. Hepatitis C Blood testing is recommended for:  Everyone born from [birth year] through 1965.  Anyone with known risk factors for hepatitis C.  Sexually Transmitted Diseases (STDs)  You should be screened each year for STDs including gonorrhea and chlamydia if: ? You are sexually active and are younger than 76 years of age. ? You are older than 76 years of age and your health care provider tells you that you are at risk for this type of infection. ? Your sexual activity has changed since you were last screened and you are at an increased risk for chlamydia or gonorrhea. Ask your health care provider if you are at risk.  Talk with your health care provider about whether you are at high risk of being infected with HIV. Your health care provider may recommend a prescription medicine to help prevent HIV infection.  What else can I do?  Schedule regular health, dental, and eye exams.  Stay current with your vaccines (immunizations).  Do not use any tobacco products, such as cigarettes, chewing tobacco, and e-cigarettes. If you need help quitting, ask your health care provider.  Limit alcohol intake to no more than 2 drinks per day. One drink equals 12 ounces of beer, 5 ounces of wine, or 1 ounces of hard liquor.  Do not use street drugs.  Do not share needles.  Ask your  health care provider for help if you need support or information about quitting drugs.  Tell your health care provider if you often feel depressed.  Tell your health care provider if you have ever been abused or do not feel safe at home. This information is not intended to replace advice given to you by your health care provider. Make sure you discuss any questions you have with your health care provider. Document Released: 01/27/2008 Document Revised: 03/29/2016 Document Reviewed: 05/04/2015 Elsevier Interactive Patient Education  2018 [facility] back on Lipitor to 40 mg a day. New script is 40 mg only.  Continue all other meds as prescribed.

## 2017-04-04 ENCOUNTER — Encounter: Payer: Self-pay | Admitting: Gastroenterology

## 2017-04-09 ENCOUNTER — Telehealth: Payer: Self-pay

## 2017-04-09 NOTE — Telephone Encounter (Signed)
Left message on pts cell informing to hold Plavix 5 days prior to his procedure. Instructed to call the office back with any concerns.

## 2017-04-18 ENCOUNTER — Other Ambulatory Visit: Payer: Self-pay | Admitting: Gastroenterology

## 2017-04-18 ENCOUNTER — Ambulatory Visit (AMBULATORY_SURGERY_CENTER): Payer: Medicare Other | Admitting: Gastroenterology

## 2017-04-18 ENCOUNTER — Encounter: Payer: Self-pay | Admitting: Gastroenterology

## 2017-04-18 VITALS — BP 123/82 | HR 52 | Temp 97.8°F | Resp 14 | Ht 72.0 in | Wt 204.0 lb

## 2017-04-18 DIAGNOSIS — D122 Benign neoplasm of ascending colon: Secondary | ICD-10-CM | POA: Diagnosis not present

## 2017-04-18 DIAGNOSIS — B9681 Helicobacter pylori [H. pylori] as the cause of diseases classified elsewhere: Secondary | ICD-10-CM | POA: Diagnosis not present

## 2017-04-18 DIAGNOSIS — Z1211 Encounter for screening for malignant neoplasm of colon: Secondary | ICD-10-CM | POA: Diagnosis not present

## 2017-04-18 DIAGNOSIS — Z800 Family history of malignant neoplasm of digestive organs: Secondary | ICD-10-CM

## 2017-04-18 DIAGNOSIS — D509 Iron deficiency anemia, unspecified: Secondary | ICD-10-CM | POA: Diagnosis present

## 2017-04-18 DIAGNOSIS — D128 Benign neoplasm of rectum: Secondary | ICD-10-CM | POA: Diagnosis not present

## 2017-04-18 DIAGNOSIS — K2950 Unspecified chronic gastritis without bleeding: Secondary | ICD-10-CM | POA: Diagnosis not present

## 2017-04-18 MED ORDER — SODIUM CHLORIDE 0.9 % IV SOLN: 500.0000 mL | INTRAVENOUS | Status: DC

## 2017-04-18 NOTE — Patient Instructions (Signed)
YOU HAD AN ENDOSCOPIC PROCEDURE TODAY AT [facility]:   Refer to the procedure report that was given to you for any specific questions about what was found during the examination.  If the procedure report does not answer your questions, please call your gastroenterologist to clarify.  If you requested that your care partner not be given the details of your procedure findings, then the procedure report has been included in a sealed envelope for you to review at your convenience later.  YOU SHOULD EXPECT: Some feelings of bloating in the abdomen. Passage of more gas than usual.  Walking can help get rid of the air that was put into your GI tract during the procedure and reduce the bloating. If you had a lower endoscopy (such as a colonoscopy or flexible sigmoidoscopy) you may notice spotting of blood in your stool or on the toilet paper. If you underwent a bowel prep for your procedure, you may not have a normal bowel movement for a few days.  Please Note:  You might notice some irritation and congestion in your nose or some drainage.  This is from the oxygen used during your procedure.  There is no need for concern and it should clear up in a day or so.  SYMPTOMS TO REPORT IMMEDIATELY:   Following lower endoscopy (colonoscopy or flexible sigmoidoscopy):  Excessive amounts of blood in the stool  Significant tenderness or worsening of abdominal pains  Swelling of the abdomen that is new, acute  Fever of 100F or higher   Following upper endoscopy (EGD)  Vomiting of blood or coffee ground material  New chest pain or pain under the shoulder blades  Painful or persistently difficult swallowing  New shortness of breath  Fever of 100F or higher  Black, tarry-looking stools  For urgent or emergent issues, a gastroenterologist can be reached at any hour by calling [phone].   DIET:  We do recommend a small meal at first, but then you may proceed to your regular diet.  Drink  plenty of fluids but you should avoid alcoholic beverages for 24 hours.  ACTIVITY:  You should plan to take it easy for the rest of today and you should NOT DRIVE or use heavy machinery until tomorrow (because of the sedation medicines used during the test).    FOLLOW UP: Our staff will call the number listed on your records the next business day following your procedure to check on you and address any questions or concerns that you may have regarding the information given to you following your procedure. If we do not reach you, we will leave a message.  However, if you are feeling well and you are not experiencing any problems, there is no need to return our call.  We will assume that you have returned to your regular daily activities without incident.  If any biopsies were taken you will be contacted by phone or by letter within the next 1-3 weeks.  Please call us at [phone] if you have not heard about the biopsies in 3 weeks.    SIGNATURES/CONFIDENTIALITY: You and/or your care partner have signed paperwork which will be entered into your electronic medical record.  These signatures attest to the fact that that the information above on your After Visit Summary has been reviewed and is understood.  Full responsibility of the confidentiality of this discharge information lies with you and/or your care-partner.    Resume Plavix in 3 days,resume remainder of medications. Information  given on Hiatal Hernia,polyps,diverticulosis and hemorrhoids.

## 2017-04-18 NOTE — Progress Notes (Signed)
A and O x3. Report to RN. Tolerated MAC anesthesia well.Gums unchanged after procedure.234m d5w during procedure.

## 2017-04-18 NOTE — Op Note (Signed)
[facility] Patient Name: Marcus Taylor Procedure Date: 04/18/2017 1:41 PM MRN: [id number] Endoscopist: Remo Lipps P. Dreama Kuna MD, MD Age: 76 Referring MD:  Date of Birth: [birth year] Gender: Male Account #: [id number] Procedure:                Upper GI endoscopy Indications:              Iron deficiency anemia Medicines:                Monitored Anesthesia Care Procedure:                Pre-Anesthesia Assessment:                           - Prior to the procedure, a History and Physical                            was performed, and patient medications and                            allergies were reviewed. The patient's tolerance of                            previous anesthesia was also reviewed. The risks                            and benefits of the procedure and the sedation                            options and risks were discussed with the patient.                            All questions were answered, and informed consent                            was obtained. Prior Anticoagulants: The patient has                            taken Plavix (clopidogrel), last dose was 5 days                            prior to procedure. ASA Grade Assessment: III - A                            patient with severe systemic disease. After                            reviewing the risks and benefits, the patient was                            deemed in satisfactory condition to undergo the                            procedure.  After obtaining informed consent, the endoscope was                            passed under direct vision. Throughout the                            procedure, the patient's blood pressure, pulse, and                            oxygen saturations were monitored continuously. The                            Model GIF-HQ190 [id number]) scope was introduced                            through the mouth, and advanced to the second part                      of duodenum. The upper GI endoscopy was                            accomplished without difficulty. The patient                            tolerated the procedure well. Scope In: Scope Out: Findings:                 Esophagogastric landmarks were identified: the                            Z-line was found at 40 cm, the gastroesophageal                            junction was found at 40 cm and the upper extent of                            the gastric folds was found at 42 cm from the                            incisors.                           A 2 cm hiatal hernia was present.                           The exam of the esophagus was otherwise normal.                           The entire examined stomach was normal. Biopsies                            were taken with a cold forceps for Helicobacter                            pylori testing.  The duodenal bulb and second portion of the                            duodenum were normal. Complications:            No immediate complications. Estimated blood loss:                            Minimal. Estimated Blood Loss:     Estimated blood loss was minimal. Impression:               - Esophagogastric landmarks identified.                           - 2 cm hiatal hernia.                           - Normal stomach. Biopsied.                           - Normal duodenal bulb and second portion of the                            duodenum.                           No overt cause for iron deficiency noted, will                            await H pylori testing. Recommendation:           - Patient has a contact number available for                            emergencies. The signs and symptoms of potential                            delayed complications were discussed with the                            patient. Return to normal activities tomorrow.                            Written discharge  instructions were provided to the                            patient.                           - Resume previous diet.                           - Continue present medications.                           - Await pathology results.                           -  Consideration for capsule endoscopy to clear the                            small bowel Charmika Macdonnell P. Geral Tuch MD, MD 04/18/2017 2:29:39 PM This report has been signed electronically.

## 2017-04-18 NOTE — Progress Notes (Signed)
Called to room to assist during endoscopic procedure.  Patient ID and intended procedure confirmed with present staff. Received instructions for my participation in the procedure from the performing physician.  

## 2017-04-18 NOTE — Op Note (Signed)
[facility] Patient Name: Marcus Taylor Procedure Date: 04/18/2017 1:41 PM MRN: [id number] Endoscopist: Remo Lipps P. Ailine Hefferan MD, MD Age: 76 Referring MD:  Date of Birth: 06-20-1941 Gender: Male Account #: [id number] Procedure:                Colonoscopy Indications:              Iron deficiency anemia, family history of colon                            cancer (sister age 61s) Medicines:                Monitored Anesthesia Care Procedure:                Pre-Anesthesia Assessment:                           - Prior to the procedure, a History and Physical                            was performed, and patient medications and                            allergies were reviewed. The patient's tolerance of                            previous anesthesia was also reviewed. The risks                            and benefits of the procedure and the sedation                            options and risks were discussed with the patient.                            All questions were answered, and informed consent                            was obtained. Prior Anticoagulants: The patient has                            taken Plavix (clopidogrel), last dose was 5 days                            prior to procedure. ASA Grade Assessment: III - A                            patient with severe systemic disease. After                            reviewing the risks and benefits, the patient was                            deemed in satisfactory condition to undergo the  procedure.                           After obtaining informed consent, the colonoscope                            was passed under direct vision. Throughout the                            procedure, the patient's blood pressure, pulse, and                            oxygen saturations were monitored continuously. The                            Colonoscope was introduced through the anus and             advanced to the the cecum, identified by                            appendiceal orifice and ileocecal valve. The                            colonoscopy was performed without difficulty. The                            patient tolerated the procedure well. The quality                            of the bowel preparation was good. The ileocecal                            valve, appendiceal orifice, and rectum were                            photographed. Scope In: 1:55:40 PM Scope Out: 2:19:35 PM Scope Withdrawal Time: 0 hours 16 minutes 43 seconds  Total Procedure Duration: 0 hours 23 minutes 55 seconds  Findings:                 The perianal and digital rectal examinations were                            normal.                           Three sessile polyps were found in the ascending                            colon. The polyps were 3 mm in size. These polyps                            were removed with a cold biopsy forceps. Resection                            and retrieval were complete.  Two sessile polyps were found in the ascending                            colon. The polyps were 5 to 6 mm in size. These                            polyps were removed with a cold snare. Resection                            and retrieval were complete.                           A 3 mm polyp was found in the rectum. The polyp was                            sessile. The polyp was removed with a cold biopsy                            forceps. Resection and retrieval were complete.                           Multiple small and large-mouthed diverticula were                            found in the entire colon.                           The colon was redundant, could not intubate ileum                            despite multiple attempts.                           Internal hemorrhoids were found during retroflexion.                           The exam was otherwise  without abnormality. Complications:            No immediate complications. Estimated blood loss:                            Minimal. Estimated Blood Loss:     Estimated blood loss was minimal. Impression:               - Three 3 mm polyps in the ascending colon, removed                            with a cold biopsy forceps. Resected and retrieved.                           - Two 5 to 6 mm polyps in the ascending colon,                            removed with a cold snare. Resected  and retrieved.                           - One 3 mm polyp in the rectum, removed with a cold                            biopsy forceps. Resected and retrieved.                           - Diverticulosis in the entire examined colon.                           - Redundant colon.                           - Internal hemorrhoids.                           - The examination was otherwise normal.                           No cause for iron deficiency noted Recommendation:           - Patient has a contact number available for                            emergencies. The signs and symptoms of potential                            delayed complications were discussed with the                            patient. Return to normal activities tomorrow.                            Written discharge instructions were provided to the                            patient.                           - Resume previous diet.                           - Continue present medications including aspirin                           - Resume Plavix in 3 days                           - Await pathology results.                           - Repeat colonoscopy is recommended for                            surveillance. The colonoscopy date will be  determined after pathology results from today's                            exam become available for review.                           - Consideration for capsule endoscopy  pending EGD                            pathology results Remo Lipps P. Sundee Garland MD, MD 04/18/2017 2:27:11 PM This report has been signed electronically.

## 2017-04-19 ENCOUNTER — Telehealth: Payer: Self-pay | Admitting: *Deleted

## 2017-04-19 NOTE — Telephone Encounter (Signed)
  Follow up Call-  Call back number 04/18/2017  Post procedure Call Back phone  # [phone]  Permission to leave phone message Yes  Some recent data might be hidden     Patient questions:  Do you have a fever, pain , or abdominal swelling? No. Pain Score  0 *  Have you tolerated food without any problems? Yes.    Have you been able to return to your normal activities? Yes.    Do you have any questions about your discharge instructions: Diet   No. Medications  No. Follow up visit  No.  Do you have questions or concerns about your Care? No.  Actions: * If pain score is 4 or above: No action needed, pain <4.

## 2017-04-24 ENCOUNTER — Encounter: Payer: Self-pay | Admitting: Family Medicine

## 2017-04-24 ENCOUNTER — Other Ambulatory Visit: Payer: Self-pay

## 2017-04-24 DIAGNOSIS — D509 Iron deficiency anemia, unspecified: Secondary | ICD-10-CM

## 2017-04-24 DIAGNOSIS — A048 Other specified bacterial intestinal infections: Secondary | ICD-10-CM

## 2017-04-24 MED ORDER — OMEPRAZOLE 40 MG PO CPDR: 40.0000 mg | DELAYED_RELEASE_CAPSULE | Freq: Two times a day (BID) | ORAL | 0 refills | Status: DC

## 2017-04-24 MED ORDER — BIS SUBCIT-METRONID-TETRACYC 140-125-125 MG PO CAPS: 3.0000 | ORAL_CAPSULE | Freq: Three times a day (TID) | ORAL | 0 refills | Status: DC

## 2017-04-25 ENCOUNTER — Telehealth: Payer: Self-pay | Admitting: Gastroenterology

## 2017-04-25 ENCOUNTER — Other Ambulatory Visit: Payer: Self-pay

## 2017-04-25 NOTE — Telephone Encounter (Signed)
Please advise. Only 2 meds sent in not 3.

## 2017-04-25 NOTE — Telephone Encounter (Signed)
It is okay to take omeprazole with the plavix, especially for such a short period of time. This previously was a controversial issue, however the totality of the evidence does not support a valid statistical association between proton pump inhibitors and cardiovascular events among plavix users. He was given Pylera and omeprazole, that is all. Thanks

## 2017-04-26 ENCOUNTER — Other Ambulatory Visit: Payer: Self-pay

## 2017-04-26 MED ORDER — OMEPRAZOLE 20 MG PO CPDR: 20.0000 mg | DELAYED_RELEASE_CAPSULE | Freq: Two times a day (BID) | ORAL | 0 refills | Status: DC

## 2017-04-26 MED ORDER — DOXYCYCLINE HYCLATE 100 MG PO TABS: 100.0000 mg | ORAL_TABLET | Freq: Two times a day (BID) | ORAL | 0 refills | Status: DC

## 2017-04-26 MED ORDER — BISMUTH SUBSALICYLATE 262 MG PO CHEW: 524.0000 mg | CHEWABLE_TABLET | Freq: Three times a day (TID) | ORAL | 0 refills | Status: AC

## 2017-04-26 MED ORDER — METRONIDAZOLE 250 MG PO TABS: 250.0000 mg | ORAL_TABLET | Freq: Four times a day (QID) | ORAL | 0 refills | Status: DC

## 2017-04-26 NOTE — Telephone Encounter (Signed)
1) Omeprazole 20 mg 2 times a day x 14 d 2) Pepto Bismol 2 tabs (262 mg each) 4 times a day x 14 d 3) Metronidazole 250 mg 4 times a day x 14 d 4) doxycycline 100 mg 2 times a day x 14 d

## 2017-04-26 NOTE — Telephone Encounter (Signed)
Sent in new Rx to patient pharmacy, called patient and left message for patient of change.

## 2017-04-26 NOTE — Telephone Encounter (Signed)
Routed to DOD, patient cannot afford the Pylera, will need to split up into separate medications.

## 2017-05-25 ENCOUNTER — Ambulatory Visit (INDEPENDENT_AMBULATORY_CARE_PROVIDER_SITE_OTHER): Payer: Medicare Other | Admitting: Podiatry

## 2017-05-25 ENCOUNTER — Encounter: Payer: Self-pay | Admitting: Podiatry

## 2017-05-25 ENCOUNTER — Other Ambulatory Visit: Payer: Medicare Other

## 2017-05-25 DIAGNOSIS — B351 Tinea unguium: Secondary | ICD-10-CM | POA: Diagnosis not present

## 2017-05-25 DIAGNOSIS — M79676 Pain in unspecified toe(s): Secondary | ICD-10-CM

## 2017-05-25 NOTE — Progress Notes (Signed)
Subjective: 76 y.o. returns the office today for painful, elongated, thickened toenails which he cannot trim himself. Denies any redness or drainage around the nails. Denies any acute changes since last appointment and no new complaints today. Denies any systemic complaints such as fevers, chills, nausea, vomiting.   Objective: AAO 3, NAD DP/PT pulses palpable, CRT less than 3 seconds Nails hypertrophic, dystrophic, elongated, brittle, discolored 10. There is tenderness overlying the nails 1-5 bilaterally. There is no surrounding erythema or drainage along the nail sites. Hammertoes are present.  No open lesions or pre-ulcerative lesions are identified. No other areas of tenderness bilateral lower extremities. No overlying edema, erythema, increased warmth. No pain with calf compression, swelling, warmth, erythema.  Assessment: Patient presents with symptomatic onychomycosis  Plan: -Treatment options including alternatives, risks, complications were discussed -Nails sharply debrided 10 without complication/bleeding. -Discussed daily foot inspection. If there are any changes, to call the office immediately.  -Follow-up in 3 months or sooner if any problems are to arise. In the meantime, encouraged to call the office with any questions, concerns, changes symptoms.  Celesta Gentile, DPM

## 2017-05-31 ENCOUNTER — Telehealth: Payer: Self-pay

## 2017-05-31 ENCOUNTER — Other Ambulatory Visit: Payer: Self-pay

## 2017-05-31 ENCOUNTER — Other Ambulatory Visit (INDEPENDENT_AMBULATORY_CARE_PROVIDER_SITE_OTHER): Payer: Medicare Other

## 2017-05-31 DIAGNOSIS — D509 Iron deficiency anemia, unspecified: Secondary | ICD-10-CM | POA: Diagnosis not present

## 2017-05-31 DIAGNOSIS — A048 Other specified bacterial intestinal infections: Secondary | ICD-10-CM

## 2017-05-31 LAB — CBC WITH DIFFERENTIAL/PLATELET
BASOS PCT: 0.4 % (ref 0.0–3.0)
Basophils Absolute: 0.1 10*3/uL (ref 0.0–0.1)
EOS PCT: 0.9 % (ref 0.0–5.0)
Eosinophils Absolute: 0.2 10*3/uL (ref 0.0–0.7)
HEMATOCRIT: 40.8 % (ref 39.0–52.0)
HEMOGLOBIN: 13.2 g/dL (ref 13.0–17.0)
Lymphs Abs: 15.6 10*3/uL — ABNORMAL HIGH (ref 0.7–4.0)
MCHC: 32.3 g/dL (ref 30.0–36.0)
MCV: 86.3 fl (ref 78.0–100.0)
MONOS PCT: 2.4 % — AB (ref 3.0–12.0)
Monocytes Absolute: 0.5 10*3/uL (ref 0.1–1.0)
Neutro Abs: 2.4 10*3/uL (ref 1.4–7.7)
Neutrophils Relative %: 12.9 % — ABNORMAL LOW (ref 43.0–77.0)
Platelets: 173 10*3/uL (ref 150.0–400.0)
RBC: 4.73 Mil/uL (ref 4.22–5.81)
RDW: 17.3 % — AB (ref 11.5–15.5)

## 2017-05-31 LAB — IRON: IRON: 53 ug/dL (ref 42–165)

## 2017-05-31 LAB — FERRITIN: Ferritin: 13.7 ng/mL — ABNORMAL LOW (ref 22.0–322.0)

## 2017-05-31 NOTE — Telephone Encounter (Signed)
Patient contacted, see result note.

## 2017-05-31 NOTE — Telephone Encounter (Signed)
Critical lab called from our lab, spoke to [place], WBC 18.7.

## 2017-05-31 NOTE — Telephone Encounter (Signed)
Recommendations in lab result note - can you please call to relay that to him? Thanks

## 2017-06-01 ENCOUNTER — Telehealth: Payer: Self-pay

## 2017-06-01 LAB — HELICOBACTER PYLORI  SPECIAL ANTIGEN
MICRO NUMBER: 81165488
SPECIMEN QUALITY: ADEQUATE

## 2017-06-01 LAB — IRON AND TIBC
Iron Saturation: 20 % (ref 15–55)
Iron: 56 ug/dL (ref 38–169)
Total Iron Binding Capacity: 282 ug/dL (ref 250–450)
UIBC: 226 ug/dL (ref 111–343)

## 2017-06-01 NOTE — Telephone Encounter (Signed)
Tell him that he has chronic high white count from his blood disorder. Nothing to do, plan to see him next month as scheduled

## 2017-06-01 NOTE — Telephone Encounter (Signed)
Called with below message. Verbalized understanding. 

## 2017-06-01 NOTE — Telephone Encounter (Signed)
Patient called and left message that he had lab work yesterday. Was told to call Dr. Alvy Bimler because his WBC count was 18.7.

## 2017-06-15 ENCOUNTER — Encounter: Payer: Self-pay | Admitting: Gastroenterology

## 2017-06-15 ENCOUNTER — Ambulatory Visit (INDEPENDENT_AMBULATORY_CARE_PROVIDER_SITE_OTHER): Payer: Medicare Other | Admitting: Gastroenterology

## 2017-06-15 DIAGNOSIS — D509 Iron deficiency anemia, unspecified: Secondary | ICD-10-CM

## 2017-06-15 NOTE — Progress Notes (Signed)
Patient here for capsule endoscopy. Tolerated procedure. Verbalizes understanding of written and verbal instructions. Capsule ID# [id number], lot # [id number], Exp 10/17/2018.

## 2017-06-21 ENCOUNTER — Telehealth: Payer: Self-pay | Admitting: Gastroenterology

## 2017-06-21 DIAGNOSIS — D500 Iron deficiency anemia secondary to blood loss (chronic): Secondary | ICD-10-CM

## 2017-06-21 DIAGNOSIS — Z7984 Long term (current) use of oral hypoglycemic drugs: Secondary | ICD-10-CM | POA: Diagnosis not present

## 2017-06-21 DIAGNOSIS — H2513 Age-related nuclear cataract, bilateral: Secondary | ICD-10-CM | POA: Diagnosis not present

## 2017-06-21 DIAGNOSIS — E119 Type 2 diabetes mellitus without complications: Secondary | ICD-10-CM | POA: Diagnosis not present

## 2017-06-21 LAB — HM DIABETES EYE EXAM

## 2017-06-21 NOTE — Telephone Encounter (Signed)
Capsule endoscopy completed:  - Good quality study - one small bowel AVM noted - otherwise negative study  Overall it's possible he has iron deficiency from small bowel AVMs in the setting of plavix use (if he has other AVMs not appreciated on this exam). His anemia has improved and no evidence of luminal malignancy on workup.  Marcus Taylor can you let the patient know the results. He should continue iron, repeat Hgb in about 3-4 months.   Thanks

## 2017-06-22 ENCOUNTER — Encounter: Payer: Self-pay | Admitting: *Deleted

## 2017-06-22 NOTE — Telephone Encounter (Signed)
The pt has been advised and will have labs in 3-4 months.

## 2017-07-02 ENCOUNTER — Encounter: Payer: Self-pay | Admitting: Hematology and Oncology

## 2017-07-02 ENCOUNTER — Ambulatory Visit (HOSPITAL_BASED_OUTPATIENT_CLINIC_OR_DEPARTMENT_OTHER): Payer: Medicare Other | Admitting: Hematology and Oncology

## 2017-07-02 ENCOUNTER — Other Ambulatory Visit (HOSPITAL_BASED_OUTPATIENT_CLINIC_OR_DEPARTMENT_OTHER): Payer: Medicare Other

## 2017-07-02 ENCOUNTER — Telehealth: Payer: Self-pay | Admitting: Hematology and Oncology

## 2017-07-02 VITALS — BP 142/78 | HR 61 | Temp 98.2°F | Resp 18 | Ht 72.0 in | Wt 207.0 lb

## 2017-07-02 DIAGNOSIS — Z299 Encounter for prophylactic measures, unspecified: Secondary | ICD-10-CM | POA: Diagnosis not present

## 2017-07-02 DIAGNOSIS — C8307 Small cell B-cell lymphoma, spleen: Secondary | ICD-10-CM | POA: Diagnosis not present

## 2017-07-02 DIAGNOSIS — D539 Nutritional anemia, unspecified: Secondary | ICD-10-CM

## 2017-07-02 DIAGNOSIS — D509 Iron deficiency anemia, unspecified: Secondary | ICD-10-CM

## 2017-07-02 DIAGNOSIS — Z23 Encounter for immunization: Secondary | ICD-10-CM | POA: Diagnosis not present

## 2017-07-02 DIAGNOSIS — D630 Anemia in neoplastic disease: Secondary | ICD-10-CM

## 2017-07-02 LAB — CBC WITH DIFFERENTIAL/PLATELET
BASO%: 0.4 % (ref 0.0–2.0)
BASOS ABS: 0 10*3/uL (ref 0.0–0.1)
EOS%: 1.2 % (ref 0.0–7.0)
Eosinophils Absolute: 0.1 10*3/uL (ref 0.0–0.5)
HCT: 40.4 % (ref 38.4–49.9)
HGB: 13.2 g/dL (ref 13.0–17.1)
LYMPH%: 78.8 % — ABNORMAL HIGH (ref 14.0–49.0)
MCH: 27.8 pg (ref 27.2–33.4)
MCHC: 32.6 g/dL (ref 32.0–36.0)
MCV: 85.3 fL (ref 79.3–98.0)
MONO#: 0.4 10*3/uL (ref 0.1–0.9)
MONO%: 3.1 % (ref 0.0–14.0)
NEUT#: 2 10*3/uL (ref 1.5–6.5)
NEUT%: 16.5 % — AB (ref 39.0–75.0)
Platelets: 170 10*3/uL (ref 140–400)
RBC: 4.74 10*6/uL (ref 4.20–5.82)
RDW: 16.3 % — ABNORMAL HIGH (ref 11.0–14.6)
WBC: 11.9 10*3/uL — ABNORMAL HIGH (ref 4.0–10.3)
lymph#: 9.4 10*3/uL — ABNORMAL HIGH (ref 0.9–3.3)

## 2017-07-02 LAB — TECHNOLOGIST REVIEW

## 2017-07-02 MED ORDER — INFLUENZA VAC SPLIT HIGH-DOSE 0.5 ML IM SUSY
0.5000 mL | PREFILLED_SYRINGE | INTRAMUSCULAR | Status: AC
  Administered 2017-07-02: 0.5 mL via INTRAMUSCULAR
  Filled 2017-07-02: qty 0.5

## 2017-07-02 NOTE — Assessment & Plan Note (Signed)
We discussed the importance of preventive care and reviewed the vaccination programs. He does not have any prior allergic reactions to influenza vaccination. He agrees to proceed with influenza vaccination today and we will administer it today at the clinic.  

## 2017-07-02 NOTE — Assessment & Plan Note (Signed)
Clinically, he has no signs of disease progression. I will see him back on an annual basis with history, physical examination and blood work. I educated the patient signs and symptoms to watch out for disease progression.

## 2017-07-02 NOTE — Assessment & Plan Note (Signed)
He had extensive evaluation for iron deficiency anemia, responded well with oral iron supplement He will continue iron supplement indefinitely 

## 2017-07-02 NOTE — Progress Notes (Signed)
[facility] OFFICE PROGRESS NOTE  Patient Care Team: [name], DO as PCP - General (Family Medicine) [name], MD as Consulting Physician (Gastroenterology) [name], MD as Consulting Physician (Hematology and Oncology) [name], MD as Referring Physician (Cardiology) [name], MD as Consulting Physician (Neurosurgery) [name], OD as Referring Physician  SUMMARY OF ONCOLOGIC HISTORY:  This is a very pleasant gentleman who was found to have abnormal CBC for the past 5 years. In April 2013, flow cytometry detectable monoclonal B-cell population suspicious for splenic/marginal zone lymphoma. Ultrasound confirmed splenomegaly. Due to lack of symptoms he was being observed. In October 2017, he was noted to have microcytic, iron deficiency anemia. He was started on oral iron supplement  INTERVAL HISTORY: Please see below for problem oriented charting. The patient denies any recent signs or symptoms of bleeding such as spontaneous epistaxis, hematuria or hematochezia. He feels well. Denies recent infection. No new lymphadenopathy. Denies recent night sweats. He tolerated oral iron supplement well without any side effects He needs influenza vaccination today. No abnormal night sweats or weight loss  REVIEW OF SYSTEMS:   Eyes: Denies blurriness of vision Ears, nose, mouth, throat, and face: Denies mucositis or sore throat Respiratory: Denies cough, dyspnea or wheezes Cardiovascular: Denies palpitation, chest discomfort or lower extremity swelling Gastrointestinal:  Denies nausea, heartburn or change in bowel habits Skin: Denies abnormal skin rashes Lymphatics: Denies new lymphadenopathy or easy bruising Neurological:Denies numbness, tingling or new weaknesses Behavioral/Psych: Mood is stable, no new changes  All other systems were reviewed with the patient and are negative.  I have reviewed the past medical history, past surgical history,  social history and family history with the patient and they are unchanged from previous note.  ALLERGIES:  has No Known Allergies.  MEDICATIONS:  Current Outpatient Medications  Medication Sig Dispense Refill  . aspirin EC 81 MG tablet Take 81 mg by mouth every morning.     Marland Kitchen atorvastatin (LIPITOR) 40 MG tablet Take 1 tablet (40 mg total) by mouth daily. 90 tablet 3  . clopidogrel (PLAVIX) 75 MG tablet Take 1 tablet (75 mg total) by mouth daily. 90 tablet 3  . doxycycline (VIBRA-TABS) 100 MG tablet Take 1 tablet (100 mg total) by mouth 2 (two) times daily. 28 tablet 0  . ferrous sulfate 325 (65 FE) MG tablet Take 325 mg by mouth daily with breakfast.    . lisinopril (PRINIVIL,ZESTRIL) 20 MG tablet Take 1 tablet (20 mg total) by mouth daily. 90 tablet 1  . metFORMIN (GLUCOPHAGE-XR) 500 MG 24 hr tablet Take 1 tablet (500 mg total) by mouth daily with breakfast. TAKE 1 TABLET BY MOUTH WITH EVENING MEAL M-W-F 45 tablet 1  . metoprolol tartrate (LOPRESSOR) 25 MG tablet Take 1 tablet (25 mg total) by mouth 2 (two) times daily. Needs office visit prior to anymore refills (Patient taking differently: Take 12.5 mg by mouth 2 (two) times daily. Needs office visit prior to anymore refills) 180 tablet 0  . metroNIDAZOLE (FLAGYL) 250 MG tablet Take 1 tablet (250 mg total) by mouth 4 (four) times daily. 56 tablet 0  . nitroGLYCERIN (NITROSTAT) 0.4 MG SL tablet Place 0.4 mg under the tongue every 5 (five) minutes as needed for chest pain.    Marland Kitchen omeprazole (PRILOSEC) 20 MG capsule Take 1 capsule (20 mg total) by mouth 2 (two) times daily before a meal. 28 capsule 0   Current Facility-Administered Medications  Medication Dose Route Frequency Provider Last Rate  Last Dose  . 0.9 %  sodium chloride infusion  500 mL Intravenous Continuous Armbruster, Carlota Raspberry, MD        PHYSICAL EXAMINATION: ECOG PERFORMANCE STATUS: 0 - Asymptomatic  Vitals:   07/02/17 1053  BP: (!) 142/78  Pulse: 61  Resp: 18  Temp: 98.2  F (36.8 C)  SpO2: 97%   Filed Weights   07/02/17 1053  Weight: 207 lb (93.9 kg)    GENERAL:alert, no distress and comfortable SKIN: skin color, texture, turgor are normal, no rashes or significant lesions EYES: normal, Conjunctiva are pink and non-injected, sclera clear OROPHARYNX:no exudate, no erythema and lips, buccal mucosa, and tongue normal  NECK: supple, thyroid normal size, non-tender, without nodularity LYMPH:  no palpable lymphadenopathy in the cervical, axillary or inguinal LUNGS: clear to auscultation and percussion with normal breathing effort HEART: regular rate & rhythm and no murmurs and no lower extremity edema ABDOMEN:abdomen soft, non-tender and normal bowel sounds Musculoskeletal:no cyanosis of digits and no clubbing  NEURO: alert & oriented x 3 with fluent speech, no focal motor/sensory deficits  LABORATORY DATA:  I have reviewed the data as listed    Component Value Date/Time   NA 142 03/13/2017 0759   NA 144 05/31/2015 1404   K 4.8 03/13/2017 0759   K 4.7 05/31/2015 1404   CL 107 03/13/2017 0759   CL 102 10/07/2012 1013   CO2 27 03/13/2017 0759   CO2 29 05/31/2015 1404   GLUCOSE 104 (H) 03/13/2017 0759   GLUCOSE 99 05/31/2015 1404   GLUCOSE 199 (H) 10/07/2012 1013   BUN 15 03/13/2017 0759   BUN 18.3 05/31/2015 1404   CREATININE 0.86 03/13/2017 0759   CREATININE 0.9 05/31/2015 1404   CALCIUM 8.8 03/13/2017 0759   CALCIUM 9.2 05/31/2015 1404   PROT 5.8 (L) 03/13/2017 0759   PROT 6.3 (L) 05/31/2015 1404   ALBUMIN 4.1 03/13/2017 0759   ALBUMIN 3.9 05/31/2015 1404   AST 29 03/13/2017 0759   AST 28 05/31/2015 1404   ALT 19 03/13/2017 0759   ALT 23 05/31/2015 1404   ALKPHOS 75 03/13/2017 0759   ALKPHOS 79 05/31/2015 1404   BILITOT 0.5 03/13/2017 0759   BILITOT 0.46 05/31/2015 1404   GFRNONAA 85 03/13/2017 0759   GFRAA >89 03/13/2017 0759    No results found for: SPEP, UPEP  Lab Results  Component Value Date   WBC 11.9 (H) 07/02/2017    NEUTROABS 2.0 07/02/2017   HGB 13.2 07/02/2017   HCT 40.4 07/02/2017   MCV 85.3 07/02/2017   PLT 170 07/02/2017      Chemistry      Component Value Date/Time   NA 142 03/13/2017 0759   NA 144 05/31/2015 1404   K 4.8 03/13/2017 0759   K 4.7 05/31/2015 1404   CL 107 03/13/2017 0759   CL 102 10/07/2012 1013   CO2 27 03/13/2017 0759   CO2 29 05/31/2015 1404   BUN 15 03/13/2017 0759   BUN 18.3 05/31/2015 1404   CREATININE 0.86 03/13/2017 0759   CREATININE 0.9 05/31/2015 1404      Component Value Date/Time   CALCIUM 8.8 03/13/2017 0759   CALCIUM 9.2 05/31/2015 1404   ALKPHOS 75 03/13/2017 0759   ALKPHOS 79 05/31/2015 1404   AST 29 03/13/2017 0759   AST 28 05/31/2015 1404   ALT 19 03/13/2017 0759   ALT 23 05/31/2015 1404   BILITOT 0.5 03/13/2017 0759   BILITOT 0.46 05/31/2015 1404     ASSESSMENT &  PLAN:  Splenic marginal zone b-cell lymphoma Clinically, he has no signs of disease progression. I will see him back on an annual basis with history, physical examination and blood work. I educated the patient signs and symptoms to watch out for disease progression.  Deficiency anemia He had extensive evaluation for iron deficiency anemia, responded well with oral iron supplement He will continue iron supplement indefinitely  Preventive measure We discussed the importance of preventive care and reviewed the vaccination programs. He does not have any prior allergic reactions to influenza vaccination. He agrees to proceed with influenza vaccination today and we will administer it today at the clinic.    No orders of the defined types were placed in this encounter.  All questions were answered. The patient knows to call the clinic with any problems, questions or concerns. No barriers to learning was detected. I spent 10 minutes counseling the patient face to face. The total time spent in the appointment was 15 minutes and more than 50% was on counseling and review of test  results     [name], MD 07/02/2017 2:39 PM

## 2017-07-02 NOTE — Telephone Encounter (Signed)
Gave avs and calendar for October 2019 °

## 2017-07-02 NOTE — Patient Instructions (Signed)

## 2017-07-19 ENCOUNTER — Encounter: Payer: Self-pay | Admitting: Family Medicine

## 2017-07-19 ENCOUNTER — Ambulatory Visit: Payer: Medicare Other | Admitting: Family Medicine

## 2017-07-19 VITALS — BP 131/67 | HR 78 | Temp 97.7°F | Resp 20 | Ht 72.0 in | Wt 204.0 lb

## 2017-07-19 DIAGNOSIS — I2510 Atherosclerotic heart disease of native coronary artery without angina pectoris: Secondary | ICD-10-CM | POA: Diagnosis not present

## 2017-07-19 DIAGNOSIS — C8307 Small cell B-cell lymphoma, spleen: Secondary | ICD-10-CM

## 2017-07-19 DIAGNOSIS — E118 Type 2 diabetes mellitus with unspecified complications: Secondary | ICD-10-CM

## 2017-07-19 DIAGNOSIS — E785 Hyperlipidemia, unspecified: Secondary | ICD-10-CM | POA: Diagnosis not present

## 2017-07-19 DIAGNOSIS — I10 Essential (primary) hypertension: Secondary | ICD-10-CM | POA: Diagnosis not present

## 2017-07-19 LAB — POCT GLYCOSYLATED HEMOGLOBIN (HGB A1C): Hemoglobin A1C: 5.9

## 2017-07-19 MED ORDER — LISINOPRIL 20 MG PO TABS: 20.0000 mg | ORAL_TABLET | Freq: Every day | ORAL | 1 refills | Status: DC

## 2017-07-19 MED ORDER — METFORMIN HCL ER 500 MG PO TB24: 500.0000 mg | ORAL_TABLET | Freq: Every day | ORAL | 1 refills | Status: DC

## 2017-07-19 MED ORDER — METOPROLOL TARTRATE 25 MG PO TABS: 12.5000 mg | ORAL_TABLET | Freq: Two times a day (BID) | ORAL | 1 refills | Status: DC

## 2017-07-19 NOTE — Progress Notes (Signed)
Patient ID: RAYYAN BURLEY, male   DOB: 1940/10/29, 76 y.o.   MRN: [id number]      Patient ID: SLAYTON LUBITZ, male  DOB: 12-16-1940, 76 y.o.   MRN: [id number]  Subjective:  HAEDYN ANCRUM is a 76 y.o. male present for follow up on chronic medical issues.  All past medical history, surgical history, allergies, family history, immunizations, medications and social history were updated in the electronic medical record today.   Patient Care Team    Relationship Specialty Notifications Start End  [name], DO PCP - General Family Medicine  01/26/16   [name], MD (Inactive) Consulting Physician Gastroenterology  01/26/16   [name], MD Consulting Physician Hematology and Oncology  01/26/16   [name], MD Referring Physician Cardiology  01/26/16   [name], MD Consulting Physician Neurosurgery  01/27/16   [name], OD Referring Physician   05/03/16    Comment: opthalmology     Hypertension/H/o MI/CAD/stentsx3/iron deficiency anemia: Pt routinely follows with cardiology at [facility], Dr. [name]. Pt has had stent placement and MI in 2015. He is prescribed Lipitor 80 mg, plavix, lisinopril 20 mg, lopressor 12.5 (BID). He takes a daily baby asa. He is prescribed nitro, he has not needed it. Echo results from April 2017 reviewed. He reports compliance with medications. Patient denies chest pain, shortness of breath, dizziness or lower extremity edema.    Diabetes type 2: Patient reports compliance on metformin 500 mg Monday Wednesday and Friday. His last A1c 6.0-5.6 > 5.9> 5.9 today. He is unable to exercise secondary to orthopedic condition. Patient denies dizziness, hyperglycemic or hypoglycemic events. Patient denies numbness, tingling in the extremities or nonhealing wounds of feet.  - Opthalmology:  06/21/2017. [place] Dr. [name] - pna series UTD/completed  - flu shot dUTD 07/02/2017 - foot exam completed today - urine micro on ACei    Name: ZAKARIYE, NEE Study Date: 11/19/2015 Height: 72 in  MRN: [id number] Weight: 197 lb  DOB: 10/18/1940 Gender: Male BSA: 2.1 m2  Age: 57 yrsEthnicity: Caucasian BP: 146/38 mmHg  Reason For Study: Ventricular arrhythmia    Ordering Physician: [id number] [facility], [name]  Performed By: [name]  Referring Physician: [name]  -  HISTORY  Arrhythmia.  -    PROCEDURE  Study Quality: Fair.  -  SUMMARY  The left ventricular size is normal.    Left ventricular systolic function is normal.  LV ejection fraction = 60-65%.    Left ventricular filling pattern is impaired.  The right ventricle is mildly dilated.  The right ventricular systolic function is normal.  The left atrium is mildly dilated.  The right atrium is mildly dilated.  There is aortic valve sclerosis.  There is trace aortic regurgitation.  No significant stenosis seen  There is trivial pericardial effusion.  -  FINDINGS:    LEFT VENTRICLE  The left ventricular size is normal. There is normal left ventricular wall   thickness. LV ejection fraction = 60-65%. Left ventricular systolic function   is normal. Left ventricular filling pattern is impaired. No segmental wall   motion abnormalities seen in the left ventricle.  -    RIGHT VENTRICLE  The right ventricle is mildly dilated. The right ventricular systolic   function is normal.    LEFT ATRIUM  The left atrium is mildly dilated.    RIGHT ATRIUM  The right atrium is mildly dilated.  -  AORTIC VALVE  There is aortic valve sclerosis. There is  trace aortic regurgitation.  -  MITRAL VALVE  The mitral valve is normal in structure and function. There is trace mitral   regurgitation.  -  TRICUSPID VALVE  Structurally normal tricuspid valve. There is trace tricuspid regurgitation.   Estimated right ventricular systolic pressure is 31 mmHg. Estimated right   atrial pressure is 5 mmHg..  -    PULMONIC VALVE  The pulmonic valve is not well visualized. Trace pulmonic valvular   regurgitation.  -  ARTERIES  The aortic root is normal size.  -  VENOUS  Pulmonary venous flow pattern is normal. IVC size was normal.  -  EFFUSION  There is trivial pericardial effusion.  -  -  MMode/2D Measurements & Calculations  IVSd: 1.3 cm  LVIDd: 4.5 cm  LVPWd: 1.1 cm  LVIDs: 3.2 cm  LA diam: 4.3 cm  Ao root: 3.3 cm  EDV(MOD-sp4): 142.8 ml  ESV(MOD-sp4): 41.1 ml  LVOT diam: 2.1 cm  LVOT area: 3.4 cm2  SV(MOD-sp4): 101.7 ml  SI(MOD-sp4): 48.1 ml/m2  LA area A4: 27.7 cm2  LA length (vol): 5.6 cm  RA area A4: 29.1 cm2  Doppler Measurements & Calculations  MV E max vel: 57.2 cm/sec  MV A max vel: 39.4 cm/sec  MV E/A: 1.5  Med Peak E' Vel: 6.0 cm/sec  Lat Peak E' Vel: 7.8 cm/sec  E/Lat E`: 7.3  E/Med E`: 9.5  MV dec time: 0.23 sec  SV(LVOT): 59.9 ml  Ao max PG: 5.7 mmHg  Ao mean PG: 2.6 mmHg  Ao V2 VTI: 25.1 cm  AVA (VTI): 2.4 cm2  LV V1 VTI: 17.9 cm  TR max vel: 256.4 cm/sec  TR max PG: 26.3 mmHg  AS Dimensionless Index (VTI): 0.71  AVAi(VTI) cm^2/m^2: 1.1 cm2  SV index(LVOT): 28.3 ml/m2      Reading Physician:  [name], [id number] 11/19/2015 12:13 PM      Past Medical History:  Diagnosis Date  . Anemia   . Arthritis   . Colonic polyp   . Coronary artery disease    stents x 3 (03-2014)  . Diabetes mellitus   . Diverticulosis    throughout entire colon  . HTN (hypertension)   . Hypertriglyceridemia   . Kidney stone   . Lymphocytosis   . Metatarsal fracture left   left 5th proximal phalanx   . OSA (obstructive sleep apnea)    "Mild" by records  . PONV (postoperative nausea and vomiting)   . RBBB 01/2016  . Splenic marginal zone b-cell lymphoma ([place]) 12/18/2011  . STEMI (ST elevation myocardial infarction) [facility])    August 2015   No Known Allergies Past Surgical History:  Procedure Laterality  Date  . CARDIAC CATHETERIZATION     stents x 3 (03/2014)  . CHOLECYSTECTOMY    . HERNIA REPAIR    . INGUINAL HERNIA REPAIR Right 05/27/2015   Procedure: LAPAROSCOPIC REPAIR RIGHT  INGUINAL HERNIA;  Surgeon: Greer Pickerel, MD;  Location: [facility];  Service: General;  Laterality: Right;  . INSERTION OF MESH Right 05/27/2015   Procedure: INSERTION OF MESH;  Surgeon: Greer Pickerel, MD;  Location: [facility];  Service: General;  Laterality: Right;  . LUMBAR DISC SURGERY    . TONSILLECTOMY AND ADENOIDECTOMY     Family History  Problem Relation Age of Onset  . Kidney disease Sister   . Colon cancer Sister 60  . Colon cancer Brother   . Arthritis Mother   . Breast cancer Mother   . Heart disease  Mother   . Bladder Cancer Brother   . Arthritis Son   . Esophageal cancer Sister   . Rectal cancer Neg Hx   . Stomach cancer Neg Hx    Social History   Socioeconomic History  . Marital status: Married    Spouse name: Not on file  . Number of children: 2  . Years of education: Not on file  . Highest education level: Not on file  Social Needs  . Financial resource strain: Not on file  . Food insecurity - worry: Not on file  . Food insecurity - inability: Not on file  . Transportation needs - medical: Not on file  . Transportation needs - non-medical: Not on file  Occupational History    Comment: used to work as an Proofreader; now retired  Tobacco Use  . Smoking status: Former Smoker    Packs/day: 1.00    Years: 20.00    Pack years: 20.00    Last attempt to quit: 08/14/1988    Years since quitting: 28.9  . Smokeless tobacco: Never Used  Substance and Sexual Activity  . Alcohol use: No  . Drug use: No  . Sexual activity: Yes    Birth control/protection: None  Other Topics Concern  . Not on file  Social History Narrative   Married to Nellie.    HS education. Retired.    Former smoker. No drugs or ETOH.   Wears seatbelt.   Wears dentures.   Smoke detector in the home. Firearms  In the  home.    Feels safe in his relationships.    ROS: Negative, with the exception of above mentioned in HPI  Objective: BP 131/67 (BP Location: Left Arm, Patient Position: Sitting, Cuff Size: Normal)   Pulse 78   Temp 97.7 F (36.5 C)   Resp 20   Ht 6' (1.829 m)   Wt 204 lb (92.5 kg)   SpO2 99%   BMI 27.67 kg/m   Gen: Afebrile. No acute distress. Nontoxic in appearance, well developed, well nourished. Pleasant caucasian male.  HENT: AT. [place].  MMM. Eyes:Pupils Equal Round Reactive to light, Extraocular movements intact,  Conjunctiva without redness, discharge or icterus. CV: RRR 1/6 SM, no edema, +2/4 P posterior tibialis pulses Chest: CTAB, no wheeze or crackles Abd: Soft.  NTND. BS present.  Neuro:  Normal gait. PERLA. EOMi. Alert. Oriented x3  Diabetic Foot Exam - Simple   Simple Foot Form Diabetic Foot exam was performed with the following findings:  Yes 07/19/2017  9:32 AM  Visual Inspection No deformities, no ulcerations, no other skin breakdown bilaterally:  Yes Sensation Testing Intact to touch and monofilament testing bilaterally:  Yes Pulse Check Posterior Tibialis and Dorsalis pulse intact bilaterally:  Yes Comments      Results for orders placed or performed in visit on 07/19/17 (from the past 48 hour(s))  POCT glycosylated hemoglobin (Hb A1C)     Status: Abnormal   Collection Time: 07/19/17  9:13 AM  Result Value Ref Range   Hemoglobin A1C 5.9     Assessment/plan: THORNTON DOHRMANN is a 76 y.o. male present for chronic medical condition follow up.  Microalbuminuria due to type 2 diabetes mellitus (HCC)/Controlled type 2 diabetes mellitus with complication, without long-term current use of insulin (HCC) - Stable. A1c 5.9 again Today >> Continue current regimen.  - Continue metformin 500 mg Monday, Wednesday and Friday only.  - Opthalmology:  UTD 06/2017 - pna series UTD/completed  - flu shot UTD  2018 - foot exam completed  today and he has a podiatrist -  urine micro >> pt on ACEi, control RF: HTN, DM - Follow-up 4-6 months  Essential hypertension/Coronary artery disease involving native coronary artery of native heart without angina pectoris/History of ST elevation myocardial infarction (STEMI)/ - Stable today. Follows yearly with cardiology.  - Continue ASA, plavix, lopressor, lisinopril and statin. - Low sodium diet encouraged. - F/U q 4-6  months  Splenic Margicnal Zone B-cell lymphoma: - continue follow up with heme/onc   Return in about 6 months (around 01/17/2018) for HTN/DM.    Electronically signed by: Howard Pouch, DO [place]

## 2017-07-19 NOTE — Patient Instructions (Signed)
I have refilled your medications for 6 months. Your diabetes is well controlled on current regimen. Your blood pressure is also good. No changes to medications today.    Hope you all have Happy Holidays!!!

## 2017-08-31 ENCOUNTER — Encounter: Payer: Self-pay | Admitting: Podiatry

## 2017-08-31 ENCOUNTER — Ambulatory Visit: Payer: Medicare Other | Admitting: Podiatry

## 2017-08-31 DIAGNOSIS — D689 Coagulation defect, unspecified: Secondary | ICD-10-CM

## 2017-08-31 DIAGNOSIS — B351 Tinea unguium: Secondary | ICD-10-CM | POA: Diagnosis not present

## 2017-08-31 DIAGNOSIS — M79676 Pain in unspecified toe(s): Secondary | ICD-10-CM

## 2017-08-31 NOTE — Progress Notes (Signed)
Subjective: 77 y.o. returns the office today for painful, elongated, thickened toenails which he cannot trim himself. Denies any redness or drainage around the nails. Denies any acute changes since last appointment and no new complaints today. Denies any systemic complaints such as fevers, chills, nausea, vomiting.   He is on Plavix.   Objective: AAO 3, NAD DP/PT pulses palpable, CRT less than 3 seconds Nails hypertrophic, dystrophic, elongated, brittle, discolored 10. There is tenderness overlying the nails 1-5 bilaterally. There is no surrounding erythema or drainage along the nail sites. Hammertoes are present.  No open lesions or pre-ulcerative lesions are identified. No other areas of tenderness bilateral lower extremities. No overlying edema, erythema, increased warmth. No pain with calf compression, swelling, warmth, erythema. No changes otherwise  Assessment: Patient presents with symptomatic onychomycosis  Plan: -Treatment options including alternatives, risks, complications were discussed -Nails sharply debrided 10 without complication/bleeding. -Discussed daily foot inspection. If there are any changes, to call the office immediately.  -Follow-up in 9 weeks for nail trim (they were very long today for 3 months) or sooner if any problems are to arise. In the meantime, encouraged to call the office with any questions, concerns, changes symptoms.  Celesta Gentile, DPM

## 2017-09-24 ENCOUNTER — Telehealth: Payer: Self-pay | Admitting: *Deleted

## 2017-09-24 DIAGNOSIS — D509 Iron deficiency anemia, unspecified: Secondary | ICD-10-CM

## 2017-09-24 NOTE — Telephone Encounter (Signed)
-----   Message from Jeoffrey Massed, RN sent at 06/22/2017  2:51 PM EST ----- Pt to get labs see 06/22/17 note

## 2017-09-24 NOTE — Telephone Encounter (Signed)
Patient has been advised that he needs hemoglobin drawn this week to follow up on IDA. He verbalizes understanding. Orders entered in [place] (I have confirmed with Dr Havery Moros that he only wants hgb, not CBC).

## 2017-09-28 ENCOUNTER — Other Ambulatory Visit (INDEPENDENT_AMBULATORY_CARE_PROVIDER_SITE_OTHER): Payer: Medicare Other

## 2017-09-28 DIAGNOSIS — D509 Iron deficiency anemia, unspecified: Secondary | ICD-10-CM

## 2017-09-28 LAB — HEMOGLOBIN: HEMOGLOBIN: 13.4 g/dL (ref 13.0–17.0)

## 2017-10-04 NOTE — Telephone Encounter (Signed)
Labs completed 09-28-2017

## 2017-11-02 ENCOUNTER — Encounter: Payer: Self-pay | Admitting: Podiatry

## 2017-11-02 ENCOUNTER — Ambulatory Visit: Payer: Medicare Other | Admitting: Podiatry

## 2017-11-02 DIAGNOSIS — D689 Coagulation defect, unspecified: Secondary | ICD-10-CM | POA: Diagnosis not present

## 2017-11-02 DIAGNOSIS — M79676 Pain in unspecified toe(s): Secondary | ICD-10-CM

## 2017-11-02 DIAGNOSIS — B351 Tinea unguium: Secondary | ICD-10-CM

## 2017-11-02 NOTE — Progress Notes (Signed)
Subjective: 77 y.o. returns the office today for painful, elongated, thickened toenails which he cannot trim himself. Denies any redness or drainage around the nails. Denies any acute changes since last appointment and no new complaints today. Denies any systemic complaints such as fevers, chills, nausea, vomiting.   He is on Plavix.   Objective: AAO 3, NAD DP/PT pulses palpable, CRT less than 3 seconds Nails hypertrophic, dystrophic, elongated, brittle, discolored 10. There is tenderness overlying the nails 1-5 bilaterally. There is no surrounding erythema or drainage along the nail sites. Small callus spot on lateral right hallux site. No underlying ulceration, drainage or signs of infection.  Hammertoes are present.  No open lesions or pre-ulcerative lesions are identified. No other areas of tenderness bilateral lower extremities. No overlying edema, erythema, increased warmth. No pain with calf compression, swelling, warmth, erythema. No changes otherwise  Assessment: Patient presents with symptomatic onychomycosis  Plan: -Treatment options including alternatives, risks, complications were discussed -Nails sharply debrided 10 without complication/bleeding. -Discussed daily foot inspection. If there are any changes, to call the office immediately.  -Follow-up in 9 weeks for nail trim or sooner if any problems are to arise. In the meantime, encouraged to call the office with any questions, concerns, changes symptoms.  Celesta Gentile, DPM

## 2017-11-29 DIAGNOSIS — I4510 Unspecified right bundle-branch block: Secondary | ICD-10-CM | POA: Diagnosis not present

## 2017-11-29 DIAGNOSIS — Z87891 Personal history of nicotine dependence: Secondary | ICD-10-CM | POA: Diagnosis not present

## 2017-11-29 DIAGNOSIS — Z7901 Long term (current) use of anticoagulants: Secondary | ICD-10-CM | POA: Diagnosis not present

## 2017-11-29 DIAGNOSIS — Z7982 Long term (current) use of aspirin: Secondary | ICD-10-CM | POA: Diagnosis not present

## 2017-11-29 DIAGNOSIS — I491 Atrial premature depolarization: Secondary | ICD-10-CM | POA: Diagnosis not present

## 2017-11-29 DIAGNOSIS — I2510 Atherosclerotic heart disease of native coronary artery without angina pectoris: Secondary | ICD-10-CM | POA: Diagnosis not present

## 2017-11-29 DIAGNOSIS — Z79899 Other long term (current) drug therapy: Secondary | ICD-10-CM | POA: Diagnosis not present

## 2018-01-03 ENCOUNTER — Other Ambulatory Visit: Payer: Self-pay | Admitting: *Deleted

## 2018-01-03 MED ORDER — METFORMIN HCL ER 500 MG PO TB24: 500.0000 mg | ORAL_TABLET | Freq: Every day | ORAL | 0 refills | Status: DC

## 2018-01-04 ENCOUNTER — Encounter: Payer: Self-pay | Admitting: Podiatry

## 2018-01-04 ENCOUNTER — Ambulatory Visit: Payer: Medicare Other | Admitting: Podiatry

## 2018-01-04 DIAGNOSIS — D689 Coagulation defect, unspecified: Secondary | ICD-10-CM

## 2018-01-04 DIAGNOSIS — M79676 Pain in unspecified toe(s): Secondary | ICD-10-CM | POA: Diagnosis not present

## 2018-01-04 DIAGNOSIS — B351 Tinea unguium: Secondary | ICD-10-CM | POA: Diagnosis not present

## 2018-01-08 ENCOUNTER — Other Ambulatory Visit: Payer: Self-pay | Admitting: *Deleted

## 2018-01-08 MED ORDER — CLOPIDOGREL BISULFATE 75 MG PO TABS: 75.0000 mg | ORAL_TABLET | Freq: Every day | ORAL | 0 refills | Status: DC

## 2018-01-08 NOTE — Progress Notes (Signed)
Subjective: 77 y.o. returns the office today for painful, elongated, thickened toenails which he cannot trim himself. Denies any redness or drainage around the nails. Denies any acute changes since last appointment and no new complaints today. Denies any systemic complaints such as fevers, chills, nausea, vomiting.   He is on Plavix.   Objective: AAO 3, NAD DP/PT pulses palpable, CRT less than 3 seconds Nails hypertrophic, dystrophic, elongated, brittle, discolored 10. There is tenderness overlying the nails 1-5 bilaterally. There is no surrounding erythema or drainage along the nail sites. Small callus spot on lateral right hallux site. No underlying ulceration, drainage or signs of infection.  Hammertoes are present.  No open lesions or pre-ulcerative lesions are identified. No pain with calf compression, swelling, warmth, erythema.  Assessment: 77-year-old male presents with symptomatic onychomycosis  Plan: -Treatment options including alternatives, risks, complications were discussed -Nails sharply debrided 10 without complication/bleeding. -Discussed daily foot inspection. If there are any changes, to call the office immediately.  -Follow-up in 9 weeks for nail trim or sooner if any problems are to arise. In the meantime, encouraged to call the office with any questions, concerns, changes symptoms.  Matthew Wagoner, DPM  

## 2018-01-17 ENCOUNTER — Ambulatory Visit (INDEPENDENT_AMBULATORY_CARE_PROVIDER_SITE_OTHER): Payer: Medicare Other | Admitting: Family Medicine

## 2018-01-17 ENCOUNTER — Encounter: Payer: Self-pay | Admitting: Family Medicine

## 2018-01-17 VITALS — BP 125/79 | HR 60 | Temp 97.9°F | Resp 20 | Ht 72.0 in | Wt 206.6 lb

## 2018-01-17 DIAGNOSIS — I252 Old myocardial infarction: Secondary | ICD-10-CM | POA: Diagnosis not present

## 2018-01-17 DIAGNOSIS — E118 Type 2 diabetes mellitus with unspecified complications: Secondary | ICD-10-CM

## 2018-01-17 DIAGNOSIS — D696 Thrombocytopenia, unspecified: Secondary | ICD-10-CM

## 2018-01-17 DIAGNOSIS — E785 Hyperlipidemia, unspecified: Secondary | ICD-10-CM

## 2018-01-17 DIAGNOSIS — C8307 Small cell B-cell lymphoma, spleen: Secondary | ICD-10-CM

## 2018-01-17 DIAGNOSIS — I2510 Atherosclerotic heart disease of native coronary artery without angina pectoris: Secondary | ICD-10-CM

## 2018-01-17 DIAGNOSIS — I10 Essential (primary) hypertension: Secondary | ICD-10-CM

## 2018-01-17 LAB — CBC WITH DIFFERENTIAL/PLATELET
BASOS ABS: 0.1 10*3/uL (ref 0.0–0.1)
Basophils Relative: 0.3 % (ref 0.0–3.0)
EOS ABS: 0.1 10*3/uL (ref 0.0–0.7)
Eosinophils Relative: 0.7 % (ref 0.0–5.0)
HEMATOCRIT: 41.8 % (ref 39.0–52.0)
Hemoglobin: 13.6 g/dL (ref 13.0–17.0)
Lymphs Abs: 14.1 10*3/uL — ABNORMAL HIGH (ref 0.7–4.0)
MCHC: 32.5 g/dL (ref 30.0–36.0)
MCV: 86.4 fl (ref 78.0–100.0)
Monocytes Absolute: 0.4 10*3/uL (ref 0.1–1.0)
Monocytes Relative: 2.1 % — ABNORMAL LOW (ref 3.0–12.0)
NEUTROS ABS: 2.6 10*3/uL (ref 1.4–7.7)
NEUTROS PCT: 15.3 % — AB (ref 43.0–77.0)
PLATELETS: 173 10*3/uL (ref 150.0–400.0)
RBC: 4.84 Mil/uL (ref 4.22–5.81)
RDW: 16.6 % — ABNORMAL HIGH (ref 11.5–15.5)
WBC: 17.3 10*3/uL — ABNORMAL HIGH (ref 4.0–10.5)

## 2018-01-17 LAB — BASIC METABOLIC PANEL
BUN: 14 mg/dL (ref 6–23)
CHLORIDE: 105 meq/L (ref 96–112)
CO2: 29 meq/L (ref 19–32)
Calcium: 9.3 mg/dL (ref 8.4–10.5)
Creatinine, Ser: 0.85 mg/dL (ref 0.40–1.50)
GFR: 92.99 mL/min (ref >60.00)
Glucose, Bld: 106 mg/dL — ABNORMAL HIGH (ref 70–99)
Potassium: 4.4 mEq/L (ref 3.5–5.1)
Sodium: 141 mEq/L (ref 135–145)

## 2018-01-17 LAB — POCT GLYCOSYLATED HEMOGLOBIN (HGB A1C): HbA1c, POC (controlled diabetic range): 5.7 % (ref 0.0–7.0)

## 2018-01-17 LAB — LIPID PANEL
CHOL/HDL RATIO: 4
Cholesterol: 96 mg/dL (ref 0–200)
HDL: 26.4 mg/dL — ABNORMAL LOW (ref >39.00)
LDL CALC: 45 mg/dL (ref 0–99)
NonHDL: 69.11
TRIGLYCERIDES: 123 mg/dL (ref 0.0–149.0)
VLDL: 24.6 mg/dL (ref 0.0–40.0)

## 2018-01-17 LAB — TSH: TSH: 1.43 u[IU]/mL (ref 0.35–4.50)

## 2018-01-17 MED ORDER — LISINOPRIL 20 MG PO TABS: 20.0000 mg | ORAL_TABLET | Freq: Every day | ORAL | 1 refills | Status: DC

## 2018-01-17 MED ORDER — METFORMIN HCL ER 500 MG PO TB24: 500.0000 mg | ORAL_TABLET | Freq: Every day | ORAL | 0 refills | Status: DC

## 2018-01-17 MED ORDER — CLOPIDOGREL BISULFATE 75 MG PO TABS: 75.0000 mg | ORAL_TABLET | Freq: Every day | ORAL | 0 refills | Status: DC

## 2018-01-17 MED ORDER — METOPROLOL TARTRATE 25 MG PO TABS: 12.5000 mg | ORAL_TABLET | Freq: Two times a day (BID) | ORAL | 1 refills | Status: DC

## 2018-01-17 MED ORDER — NITROGLYCERIN 0.4 MG SL SUBL: 0.4000 mg | SUBLINGUAL_TABLET | SUBLINGUAL | 0 refills | Status: DC | PRN

## 2018-01-17 MED ORDER — ZOSTER VAC RECOMB ADJUVANTED 50 MCG/0.5ML IM SUSR: 0.5000 mL | Freq: Once | INTRAMUSCULAR | 1 refills | Status: AC

## 2018-01-17 NOTE — Progress Notes (Signed)
Patient ID: BILAL MANZER, male   DOB: 05/22/1941, 77 y.o.   MRN: [id number]      Patient ID: SHRAVAN SALAHUDDIN, male  DOB: 06-27-1941, 77 y.o.   MRN: [id number] Patient Care Team    Relationship Specialty Notifications Start End  [name], DO PCP - General Family Medicine  01/26/16   [name], MD (Inactive) Consulting Physician Gastroenterology  01/26/16   [name], MD Consulting Physician Hematology and Oncology  01/26/16   [name], MD Referring Physician Cardiology  01/26/16   [name], MD Consulting Physician Neurosurgery  01/27/16   [name], OD Referring Physician   05/03/16    Comment: opthalmology     Subjective:  YOREL REDDER is a 77 y.o. male present for follow up on chronic medical issues.  All past medical history, surgical history, allergies, family history, immunizations, medications and social history were updated in the electronic medical record today.  Hypertension/H/o MI/CAD/stentsx3/iron deficiency anemia: Pt routinely follows with cardiology at [facility], Dr. [name]. Pt has had stent placement and MI in 2015. He is prescribed Lipitor 80 mg, plavix, lisinopril 20 mg, lopressor 12.5 (BID). He takes a daily baby asa. He is prescribed nitro, he has not needed it.  He reports compliance with his medications today.Patient denies chest pain, shortness of breath, dizziness or lower extremity edema.    CBC    Component Value Date/Time   WBC 17.3 (H) 01/17/2018 0939   RBC 4.84 01/17/2018 0939   HGB 13.6 01/17/2018 0939   HGB 13.2 07/02/2017 1036   HCT 41.8 01/17/2018 0939   HCT 40.4 07/02/2017 1036   PLT 173.0 01/17/2018 0939   PLT 170 07/02/2017 1036   MCV 86.4 01/17/2018 0939   MCV 85.3 07/02/2017 1036   MCH 27.8 07/02/2017 1036   MCH 25.7 (L) 05/24/2015 0935   MCHC 32.5 01/17/2018 0939   RDW 16.6 (H) 01/17/2018 0939   RDW 16.3 (H) 07/02/2017 1036   LYMPHSABS 14.1 (H) 01/17/2018 0939   LYMPHSABS 9.4 (H) 07/02/2017 1036   MONOABS 0.4  01/17/2018 0939   MONOABS 0.4 07/02/2017 1036   EOSABS 0.1 01/17/2018 0939   EOSABS 0.1 07/02/2017 1036   BASOSABS 0.1 01/17/2018 0939   BASOSABS 0.0 07/02/2017 1036   BMP Latest Ref Rng & Units 01/17/2018 03/13/2017 11/15/2016  Glucose 70 - 99 mg/dL 106(H) 104(H) 113(H)  BUN 6 - 23 mg/dL 14 15 15   Creatinine 0.40 - 1.50 mg/dL 0.85 0.86 0.85  Sodium 135 - 145 mEq/L 141 142 141  Potassium 3.5 - 5.1 mEq/L 4.4 4.8 4.4  Chloride 96 - 112 mEq/L 105 107 106  CO2 19 - 32 mEq/L 29 27 21   Calcium 8.4 - 10.5 mg/dL 9.3 8.8 9.0   Lipid Panel     Component Value Date/Time   CHOL 96 01/17/2018 0939   TRIG 123.0 01/17/2018 0939   HDL 26.40 (L) 01/17/2018 0939   CHOLHDL 4 01/17/2018 0939   VLDL 24.6 01/17/2018 0939   LDLCALC 45 01/17/2018 0939     Diabetes type 2: Patient reports compliance on metformin 500 mg Monday Wednesday and Friday. His last A1c 6.0-5.6 > 5.9> 5.9 >5.7 today. He is unable to exercise secondary to orthopedic condition. Patient denies dizziness, hyperglycemic or hypoglycemic events. Patient denies numbness, tingling in the extremities or nonhealing wounds of feet.  - Opthalmology:  06/21/2017. [place] Dr. [name] - pna series UTD/completed  - flu shot dUTD 07/02/2017 - foot exam completed UTD - urine  micro on ACei   Past Medical History:  Diagnosis Date  . Anemia   . Arthritis   . Colonic polyp   . Coronary artery disease    stents x 3 (03-2014)  . Diabetes mellitus   . Diverticulosis    throughout entire colon  . HTN (hypertension)   . Hypertriglyceridemia   . Kidney stone   . Lymphocytosis   . Metatarsal fracture left   left 5th proximal phalanx   . OSA (obstructive sleep apnea)    "Mild" by records  . PONV (postoperative nausea and vomiting)   . RBBB 01/2016  . Splenic marginal zone b-cell lymphoma ([place]) 12/18/2011  . STEMI (ST elevation myocardial infarction) [facility])    August 2015   No Known Allergies Past Surgical History:  Procedure Laterality Date  .  CARDIAC CATHETERIZATION     stents x 3 (03/2014)  . CHOLECYSTECTOMY    . HERNIA REPAIR    . INGUINAL HERNIA REPAIR Right 05/27/2015   Procedure: LAPAROSCOPIC REPAIR RIGHT  INGUINAL HERNIA;  Surgeon: Greer Pickerel, MD;  Location: [facility];  Service: General;  Laterality: Right;  . INSERTION OF MESH Right 05/27/2015   Procedure: INSERTION OF MESH;  Surgeon: Greer Pickerel, MD;  Location: [facility];  Service: General;  Laterality: Right;  . LUMBAR DISC SURGERY    . TONSILLECTOMY AND ADENOIDECTOMY     Family History  Problem Relation Age of Onset  . Kidney disease Sister   . Colon cancer Sister 77  . Colon cancer Brother   . Arthritis Mother   . Breast cancer Mother   . Heart disease Mother   . Bladder Cancer Brother   . Arthritis Son   . Esophageal cancer Sister   . Rectal cancer Neg Hx   . Stomach cancer Neg Hx    Social History   Socioeconomic History  . Marital status: Married    Spouse name: Not on file  . Number of children: 2  . Years of education: Not on file  . 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 Financial resource strain: Not on file  . Food insecurity:    Worry: Not on file    Inability: Not on file  . Transportation needs:    Medical: Not on file    Non-medical: Not on file  Tobacco Use  . Smoking status: Former Smoker    Packs/day: 1.00    Years: 20.00    Pack years: 20.00    Last attempt to quit: 08/14/1988    Years since quitting: 29.4  . Smokeless tobacco: Never Used  Substance and Sexual Activity  . Alcohol use: No  . Drug use: No  . Sexual activity: Yes    Birth control/protection: None  Lifestyle  . Physical activity:    Days per week: Not on file    Minutes per session: Not on file  . Stress: Not on file  Relationships  . Social connections:    Talks on phone: Not on file    Gets together: Not on file    Attends religious service: Not on file     Active member of club or organization: Not on file    Attends meetings of clubs or organizations: Not on file    Relationship status: Not on file  . Intimate partner violence:    Fear of current or ex partner: Not on  file    Emotionally abused: Not on file    Physically abused: Not on file    Forced sexual activity: Not on file  Other Topics Concern  . Not on file  Social History Narrative   Married to [place].    HS education. Retired.    Former smoker. No drugs or ETOH.   Wears seatbelt.   Wears dentures.   Smoke detector in the home. Firearms  In the home.    Feels safe in his relationships.    ROS: Negative, with the exception of above mentioned in HPI  Objective: BP 125/79 (BP Location: Right Arm, Patient Position: Sitting, Cuff Size: Normal)   Pulse 60   Temp 97.9 F (36.6 C)   Resp 20   Ht 6' (1.829 m)   Wt 206 lb 9.6 oz (93.7 kg)   SpO2 97%   BMI 28.02 kg/m   Gen: Afebrile. No acute distress.  Non-toxic and presentation.  Well-developed.  Very pleasant Caucasian male.  Overweight. HENT: AT. [place]. . MMM.  Eyes:Pupils Equal Round Reactive to light, Extraocular movements intact,  Conjunctiva without redness, discharge or icterus. Neck/lymp/endocrine: Supple, no lymphadenopathy, no thyromegaly CV: RRR , no edema, +2/4 P posterior tibialis pulses/  SMChest: CTAB, no wheeze or crackles Abd: Soft. NTND. BS present.  No masses palpated.  Skin: No rashes, purpura or petechiae.  Neuro: Normal gait. PERLA. EOMi. Alert. Oriented x3 Psych: Normal affect, dress and demeanor. Normal speech. Normal thought content and judgment.    Results for orders placed or performed in visit on 01/17/18 (from the past 48 hour(s))  POCT glycosylated hemoglobin (Hb A1C)     Status: Abnormal   Collection Time: 01/17/18  8:56 AM  Result Value Ref Range   Hemoglobin A1C  4.0 - 5.6 %   HbA1c, POC (prediabetic range)  5.7 - 6.4 %   HbA1c, POC (controlled diabetic range) 5.7 0.0 - 7.0 %  Basic  Metabolic Panel (BMET)     Status: Abnormal   Collection Time: 01/17/18  9:39 AM  Result Value Ref Range   Sodium 141 135 - 145 mEq/L   Potassium 4.4 3.5 - 5.1 mEq/L   Chloride 105 96 - 112 mEq/L   CO2 29 19 - 32 mEq/L   Glucose, Bld 106 (H) 70 - 99 mg/dL   BUN 14 6 - 23 mg/dL   Creatinine, Ser 0.85 0.40 - 1.50 mg/dL   Calcium 9.3 8.4 - 10.5 mg/dL   GFR 92.99 >60.00 mL/min  Lipid panel     Status: Abnormal   Collection Time: 01/17/18  9:39 AM  Result Value Ref Range   Cholesterol 96 0 - 200 mg/dL    Comment: ATP III Classification       Desirable:  < 200 mg/dL               Borderline High:  200 - 239 mg/dL          High:  > = 240 mg/dL   Triglycerides 123.0 0.0 - 149.0 mg/dL    Comment: Normal:  <150 mg/dLBorderline High:  150 - 199 mg/dL   HDL 26.40 (L) >39.00 mg/dL   VLDL 24.6 0.0 - 40.0 mg/dL   LDL Cholesterol 45 0 - 99 mg/dL   Total CHOL/HDL Ratio 4     Comment:                Men          Women1/2 Average Risk  3.4          3.3Average Risk          5.0          4.42X Average Risk          9.6          7.13X Average Risk          15.0          11.0                       NonHDL 69.11     Comment: NOTE:  Non-HDL goal should be 30 mg/dL higher than patient's LDL goal (i.e. LDL goal of < 70 mg/dL, would have non-HDL goal of < 100 mg/dL)  CBC w/Diff     Status: Abnormal   Collection Time: 01/17/18  9:39 AM  Result Value Ref Range   WBC 17.3 (H) 4.0 - 10.5 K/uL   RBC 4.84 4.22 - 5.81 Mil/uL   Hemoglobin 13.6 13.0 - 17.0 g/dL   HCT 41.8 39.0 - 52.0 %   MCV 86.4 78.0 - 100.0 fl   MCHC 32.5 30.0 - 36.0 g/dL   RDW 16.6 (H) 11.5 - 15.5 %   Platelets 173.0 150.0 - 400.0 K/uL   Neutrophils Relative % 15.3 (L) 43.0 - 77.0 %   Lymphocytes Relative 81.6 Repeated and verified X2. (H) 12.0 - 46.0 %   Monocytes Relative 2.1 (L) 3.0 - 12.0 %   Eosinophils Relative 0.7 0.0 - 5.0 %   Basophils Relative 0.3 0.0 - 3.0 %   Neutro Abs 2.6 1.4 - 7.7 K/uL   Lymphs Abs 14.1 (H) 0.7 - 4.0 K/uL     Monocytes Absolute 0.4 0.1 - 1.0 K/uL   Eosinophils Absolute 0.1 0.0 - 0.7 K/uL   Basophils Absolute 0.1 0.0 - 0.1 K/uL  TSH     Status: None   Collection Time: 01/17/18  9:39 AM  Result Value Ref Range   TSH 1.43 0.35 - 4.50 uIU/mL    Assessment/plan: JANIE STROTHMAN is a 77 y.o. male present for chronic medical condition follow up.  Microalbuminuria due to type 2 diabetes mellitus (HCC)/Controlled type 2 diabetes mellitus with complication, without long-term current use of insulin (HCC) -Stable A1c 5.9--> 5.7 Today >> Continue current regimen.  - Continue metformin 500 mg Monday, Wednesday and Friday only.  - Opthalmology:  UTD 06/2017 - pna series UTD/completed  - flu shot UTD 2018 - foot exam utd and he has a podiatrist - urine micro >> pt on ACEi, control RF: HTN, DM - Follow-up 4-6 months  Essential hypertension/Coronary artery disease involving native coronary artery of native heart without angina pectoris/History of ST elevation myocardial infarction (STEMI)/ -Stable today. Follows yearly with cardiology.  - Continue ASA, plavix, lopressor, lisinopril and statin. - Low sodium diet encouraged. - F/U q 4-6  months  Splenic Margicnal Zone B-cell lymphoma/thrombocytopenia: - continue follow up with heme/onc   Vaccine counseling: -Shingrix prescription provided today  Return in about 4 months (around 05/19/2018).    Electronically signed by: Howard Pouch, DO [place]

## 2018-01-17 NOTE — Patient Instructions (Addendum)
It was great to see you today.  A1c looks  BP look good.  Refills will be called in this afternoon.    shingrix script printed    Please help [place] help you:  We are honored you have chosen [facility] for your Primary Care home. Below you will find basic instructions that you may need to access in the future. Please help [place] help you by reading the instructions, which cover many of the frequent questions we experience.   Prescription refills and request:  -In order to allow more efficient response time, please call your pharmacy for all refills. They will forward the request electronically to [place]. This allows for the quickest possible response. Request left on a nurse line can take longer to refill, since these are checked as time allows between office patients and other phone calls.  - refill request can take up to 3-5 working days to complete.  - If request is sent electronically and request is appropiate, it is usually completed in 1-2 business days.  - all patients will need to be seen routinely for all chronic medical conditions requiring prescription medications (see follow-up below). If you are overdue for follow up on your condition, you will be asked to make an appointment and we will call in enough medication to cover you until your appointment (up to 30 days).  - all controlled substances will require a face to face visit to request/refill.  - if you desire your prescriptions to go through a new pharmacy, and have an active script at original pharmacy, you will need to call your pharmacy and have scripts transferred to new pharmacy. This is completed between the pharmacy locations and not by your provider.    Results: If any images or labs were ordered, it can take up to 1 week to get results depending on the test ordered and the lab/facility running and resulting the test. - Normal or stable results, which do not need further discussion, may be released to your mychart immediately  with attached note to you. A call may not be generated for normal results. Please make certain to sign up for mychart. If you have questions on how to activate your mychart you can call the front office.  - If your results need further discussion, our office will attempt to contact you via phone, and if unable to reach you after 2 attempts, we will release your abnormal result to your mychart with instructions.  - All results will be automatically released in mychart after 1 week.  - Your provider will provide you with explanation and instruction on all relevant material in your results. Please keep in mind, results and labs may appear confusing or abnormal to the untrained eye, but it does not mean they are actually abnormal for you personally. If you have any questions about your results that are not covered, or you desire more detailed explanation than what was provided, you should make an appointment with your provider to do so.   Our office handles many outgoing and incoming calls daily. If we have not contacted you within 1 week about your results, please check your mychart to see if there is a message first and if not, then contact our office.  In helping with this matter, you help decrease call volume, and therefore allow [place] to be able to respond to patients needs more efficiently.   Acute office visits (sick visit):  An acute visit is intended for a new problem and  are scheduled in shorter time slots to allow schedule openings for patients with new problems. This is the appropriate visit to discuss a new problem. Problems will not be addressed by phone call or Echart message. Appointment is needed if requesting treatment. In order to provide you with excellent quality medical care with proper time for you to explain your problem, have an exam and receive treatment with instructions, these appointments should be limited to one new problem per visit. If you experience a new problem, in which you  desire to be addressed, please make an acute office visit, we save openings on the schedule to accommodate you. Please do not save your new problem for any other type of visit, let us take care of it properly and quickly for you.   Follow up visits:  Depending on your condition(s) your provider will need to see you routinely in order to provide you with quality care and prescribe medication(s). Most chronic conditions (Example: hypertension, Diabetes, depression/anxiety... etc), require visits a couple times a year. Your provider will instruct you on proper follow up for your personal medical conditions and history. Please make certain to make follow up appointments for your condition as instructed. Failing to do so could result in lapse in your medication treatment/refills. If you request a refill, and are overdue to be seen on a condition, we will always provide you with a 30 day script (once) to allow you time to schedule.    Medicare wellness (well visit): - we have a wonderful Nurse Maudie Mercury), that will meet with you and provide you will yearly medicare wellness visits. These visits should occur yearly (can not be scheduled less than 1 calendar year apart) and cover preventive health, immunizations, advance directives and screenings you are entitled to yearly through your medicare benefits. Do not miss out on your entitled benefits, this is when medicare will pay for these benefits to be ordered for you.  These are strongly encouraged by your provider and is the appropriate type of visit to make certain you are up to date with all preventive health benefits. If you have not had your medicare wellness exam in the last 12 months, please make certain to schedule one by calling the office and schedule your medicare wellness with Maudie Mercury as soon as possible.   Yearly physical (well visit):  - Adults are recommended to be seen yearly for physicals. Check with your insurance and date of your last physical, most  insurances require one calendar year between physicals. Physicals include all preventive health topics, screenings, medical exam and labs that are appropriate for gender/age and history. You may have fasting labs needed at this visit. This is a well visit (not a sick visit), new problems should not be covered during this visit (see acute visit).  - Pediatric patients are seen more frequently when they are younger. Your provider will advise you on well child visit timing that is appropriate for your their age. - This is not a medicare wellness visit. Medicare wellness exams do not have an exam portion to the visit. Some medicare companies allow for a physical, some do not allow a yearly physical. If your medicare allows a yearly physical you can schedule the medicare wellness with our nurse Maudie Mercury and have your physical with your provider after, on the same day. Please check with insurance for your full benefits.   Late Policy/No Shows:  - all new patients should arrive 15-30 minutes earlier than appointment to allow [place] time  to  obtain all personal demographics,  insurance information and for you to complete office paperwork. - All established patients should arrive 10-15 minutes earlier than appointment time to update all information and be checked in .  - In our best efforts to run on time, if you are late for your appointment you will be asked to either reschedule or if able, we will work you back into the schedule. There will be a wait time to work you back in the schedule,  depending on availability.  - If you are unable to make it to your appointment as scheduled, please call 24 hours ahead of time to allow [place] to fill the time slot with someone else who needs to be seen. If you do not cancel your appointment ahead of time, you may be charged a no show fee.

## 2018-01-18 ENCOUNTER — Encounter: Payer: Self-pay | Admitting: Family Medicine

## 2018-01-18 ENCOUNTER — Telehealth: Payer: Self-pay | Admitting: Family Medicine

## 2018-01-18 MED ORDER — ATORVASTATIN CALCIUM 40 MG PO TABS: 40.0000 mg | ORAL_TABLET | Freq: Every day | ORAL | 3 refills | Status: DC

## 2018-01-18 NOTE — Telephone Encounter (Signed)
Patient notified and verbalized understanding. He will see oncologist as suggested.

## 2018-01-18 NOTE — Telephone Encounter (Signed)
Please inform patient the following information: His labs look stable. His WBC on his blood count is high, like it was 7 months ago. Continue to follow with heme/onc on this issue.   His cholesterol is very low. Of course, a controlled cholesterol is wonderful, but this HDL or good cholesterol is also rather low. HDL is the "good cholesterol". He  could bring this up with eating foods such as olive oil, salmon, whole grains, beans/legumes and exercise. Could also start a fish oil supplement.

## 2018-01-31 ENCOUNTER — Encounter (INDEPENDENT_AMBULATORY_CARE_PROVIDER_SITE_OTHER): Payer: Self-pay | Admitting: Orthopaedic Surgery

## 2018-01-31 ENCOUNTER — Ambulatory Visit (INDEPENDENT_AMBULATORY_CARE_PROVIDER_SITE_OTHER): Payer: Medicare Other | Admitting: Orthopaedic Surgery

## 2018-01-31 VITALS — BP 135/67 | HR 60 | Ht 72.0 in | Wt 207.0 lb

## 2018-01-31 DIAGNOSIS — M25561 Pain in right knee: Secondary | ICD-10-CM

## 2018-01-31 DIAGNOSIS — G8929 Other chronic pain: Secondary | ICD-10-CM

## 2018-01-31 MED ORDER — LIDOCAINE HCL 1 % IJ SOLN
2.0000 mL | INTRAMUSCULAR | Status: AC | PRN
  Administered 2018-01-31: 2 mL

## 2018-01-31 MED ORDER — METHYLPREDNISOLONE ACETATE 40 MG/ML IJ SUSP
80.0000 mg | INTRAMUSCULAR | Status: AC | PRN
  Administered 2018-01-31: 80 mg

## 2018-01-31 MED ORDER — BUPIVACAINE HCL 0.5 % IJ SOLN
2.0000 mL | INTRAMUSCULAR | Status: AC | PRN
  Administered 2018-01-31: 2 mL via INTRA_ARTICULAR

## 2018-01-31 NOTE — Progress Notes (Signed)
Office Visit Note   Patient: Marcus Taylor           Date of Birth: 1941-03-31           MRN: [id number] Visit Date: 01/31/2018              Requested by: [name], DO [address] [place], [place] [address] PCP: [name], DO   Assessment & Plan: Visit Diagnoses:  1. Chronic pain of right knee     Plan: Osteoarthritis right knee with recurrent symptoms.  Will aspirate the knee and reinject cortisone.  Felt much better after aspiration of the knee of 48 cc of clear yellow joint fluid and injection of cortisone  Follow-Up Instructions: Return if symptoms worsen or fail to improve.   Orders:  Orders Placed This Encounter  Procedures  . Large Joint Inj: R knee   No orders of the defined types were placed in this encounter.     Procedures: Large Joint Inj: R knee on 01/31/2018 10:44 AM Indications: pain and diagnostic evaluation Details: 25 G 1.5 in needle, anteromedial approach  Arthrogram: No  Medications: 2 mL lidocaine 1 %; 2 mL bupivacaine 0.5 %; 80 mg methylPREDNISolone acetate 40 MG/ML Aspirate: 48 mL clear and yellow Outcome: tolerated well, no immediate complications Procedure, treatment alternatives, risks and benefits explained, specific risks discussed. Consent was given by the patient. Immediately prior to procedure a time out was called to verify the correct patient, procedure, equipment, support staff and site/side marked as required. Patient was prepped and draped in the usual sterile fashion.       Clinical Data: No additional findings.   Subjective: Chief Complaint  Patient presents with  . Right Knee - Pain  . Follow-up    BI LAT KNEE PAIN FOR 5 YRS, R IS WORSE 01/25/17 ASPIRATED AND INJECTED R KNEE  Marcus Taylor was seen a year ago for evaluation of right knee pain.  Films demonstrated moderate to advanced osteoarthritis.  I injected his knee with cortisone he notes that it made a big difference.  He is had recurrence of his pain to  the point where he has had compromise of his activities.  No injury or trauma  HPI  Review of Systems  Constitutional: Negative for fatigue and fever.  HENT: Negative for ear pain.   Eyes: Negative for pain.  Respiratory: Negative for cough and shortness of breath.   Cardiovascular: Negative for leg swelling.  Gastrointestinal: Negative for constipation and diarrhea.  Genitourinary: Negative for difficulty urinating.  Musculoskeletal: Positive for back pain. Negative for neck pain.  Skin: Negative for rash.  Allergic/Immunologic: Negative for food allergies.  Neurological: Positive for weakness. Negative for numbness.  Hematological: Bruises/bleeds easily.  Psychiatric/Behavioral: Positive for sleep disturbance.     Objective: Vital Signs: BP 135/67 (BP Location: Left Arm, Patient Position: Sitting, Cuff Size: Normal)   Pulse 60   Ht 6' (1.829 m)   Wt 207 lb (93.9 kg)   BMI 28.07 kg/m   Physical Exam  Constitutional: He is oriented to person, place, and time. He appears well-developed and well-nourished.  HENT:  Mouth/Throat: Oropharynx is clear and moist.  Eyes: Pupils are equal, round, and reactive to light. EOM are normal.  Pulmonary/Chest: Effort normal.  Neurological: He is alert and oriented to person, place, and time.  Skin: Skin is warm and dry.  Psychiatric: He has a normal mood and affect. His behavior is normal.    Ortho Exam awake  alert and oriented x3.  Comfortable sitting.  Walk with minimal limp referable to his right knee.  Positive effusion right knee and mostly medial joint pain that was mild.  Some patellar crepitation.  No instability.  Full extension flexed over 105 degrees without instability.  No popliteal mass or pain.  No calf pain.  No distal edema  Specialty Comments:  No specialty comments available.  Imaging: No results found.   PMFS History: Patient Active Problem List   Diagnosis Date Noted  . IDA (iron deficiency anemia) 08/09/2016    . Thrombocytopenia ([place]) 05/30/2016  . Deficiency anemia 05/04/2016  . Diabetes mellitus type 2, controlled, with complications ([place]) 18/84/1660  . Right inguinal hernia 02/03/2016  . History of colon polyps 01/27/2016  . History of ST elevation myocardial infarction (STEMI) 01/27/2016  . CAD (coronary artery disease) 01/27/2016  . Hyperlipidemia 01/27/2016  . Anemia in neoplastic disease 05/25/2014  . Splenic marginal zone b-cell lymphoma ([place]) 12/18/2011  . HTN (hypertension)    Past Medical History:  Diagnosis Date  . Anemia   . Arthritis   . Colonic polyp   . Coronary artery disease    stents x 3 (03-2014)  . Diabetes mellitus   . Diverticulosis    throughout entire colon  . HTN (hypertension)   . Hypertriglyceridemia   . Kidney stone   . Lymphocytosis   . Metatarsal fracture left   left 5th proximal phalanx   . OSA (obstructive sleep apnea)    "Mild" by records  . PONV (postoperative nausea and vomiting)   . RBBB 01/2016  . Splenic marginal zone b-cell lymphoma ([place]) 12/18/2011  . STEMI (ST elevation myocardial infarction) [facility])    August 2015    Family History  Problem Relation Age of Onset  . Kidney disease Sister   . Colon cancer Sister 65  . Colon cancer Brother   . Arthritis Mother   . Breast cancer Mother   . Heart disease Mother   . Bladder Cancer Brother   . Arthritis Son   . Esophageal cancer Sister   . Rectal cancer Neg Hx   . Stomach cancer Neg Hx     Past Surgical History:  Procedure Laterality Date  . CARDIAC CATHETERIZATION     stents x 3 (03/2014)  . CHOLECYSTECTOMY    . HERNIA REPAIR    . INGUINAL HERNIA REPAIR Right 05/27/2015   Procedure: LAPAROSCOPIC REPAIR RIGHT  INGUINAL HERNIA;  Surgeon: Greer Pickerel, MD;  Location: [facility];  Service: General;  Laterality: Right;  . INSERTION OF MESH Right 05/27/2015   Procedure: INSERTION OF MESH;  Surgeon: Greer Pickerel, MD;  Location: [facility];  Service: General;  Laterality: Right;  . LUMBAR DISC  SURGERY    . 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 Smoking status: Former Smoker    Packs/day: 1.00    Years: 20.00    Pack years: 20.00    Last attempt to quit: 08/14/1988    Years since quitting: 29.4  . Smokeless tobacco: Never Used  Substance and Sexual Activity  . Alcohol use: No  . Drug use: No  . Sexual activity: Yes    Birth control/protection: None

## 2018-02-07 DIAGNOSIS — M25511 Pain in right shoulder: Secondary | ICD-10-CM | POA: Diagnosis not present

## 2018-02-19 ENCOUNTER — Encounter (INDEPENDENT_AMBULATORY_CARE_PROVIDER_SITE_OTHER): Payer: Self-pay | Admitting: Orthopaedic Surgery

## 2018-02-19 ENCOUNTER — Ambulatory Visit (INDEPENDENT_AMBULATORY_CARE_PROVIDER_SITE_OTHER): Payer: Self-pay

## 2018-02-19 ENCOUNTER — Ambulatory Visit (INDEPENDENT_AMBULATORY_CARE_PROVIDER_SITE_OTHER): Payer: Medicare Other | Admitting: Orthopaedic Surgery

## 2018-02-19 ENCOUNTER — Other Ambulatory Visit (INDEPENDENT_AMBULATORY_CARE_PROVIDER_SITE_OTHER): Payer: Self-pay | Admitting: Radiology

## 2018-02-19 VITALS — BP 130/63 | HR 51 | Ht 72.0 in | Wt 204.0 lb

## 2018-02-19 DIAGNOSIS — M542 Cervicalgia: Secondary | ICD-10-CM | POA: Insufficient documentation

## 2018-02-19 DIAGNOSIS — M25511 Pain in right shoulder: Secondary | ICD-10-CM

## 2018-02-19 HISTORY — DX: Cervicalgia: M54.2

## 2018-02-19 NOTE — Progress Notes (Signed)
Office Visit Note   Patient: Marcus Taylor           Date of Birth: 30-Mar-1941           MRN: [id number] Visit Date: 02/19/2018              Requested by: [name], DO [address] [place], [place] [address] PCP: [name], DO   Assessment & Plan: Visit Diagnoses:  1. Acute pain of right shoulder   2. Neck pain     Plan: Advanced osteoarthritis cervical spine with anterior listhesis of C7 on T8.  No obvious neurologic deficit.  Will order MRI scan consider treatment thereafter.  His treatment options are limited at this point as he is unable to take NSAIDs and the prednisone did not work.  He might be a good candidate for physical therapy and or neck injection or epidural steroid injection  Follow-Up Instructions: Return after MRI cervical spine.   Orders:  Orders Placed This Encounter  Procedures  . XR Cervical Spine 2 or 3 views   No orders of the defined types were placed in this encounter.     Procedures: No procedures performed   Clinical Data: No additional findings.   Subjective: Chief Complaint  Patient presents with  . Follow-up    r shoulder pain 3 wks no injury.   Marcus Taylor is accompanied by his wife and here for evaluation of posterior right shoulder pain that he is been experiencing for approximately 3 weeks.  He noticed insidious onset of pain one evening before going to bed.  He does not remember any injury or trauma.  Pain seems to be localized in the parascapular region on the right.  He is not having any direct shoulder pain.  He denies any numbness or tingling.  He is not having any difficulty on the left side.  He seems to feel increased pain with with neck flexion.  He has not been dizzy or short of breath.  He cannot reproduce his pain with any motion of his right shoulder. Marcus Taylor been to an urgent care facility near his home in [place].  They placed him on a Medrol Dosepak without any benefit.  He has had some recent issues  with "my kidneys acting up taking anti-inflammatory medicines".  He has been taking Tylenol with minimal benefit.  HPI  Review of Systems  Constitutional: Negative for fatigue and fever.  HENT: Negative for ear pain.   Eyes: Negative for pain.  Respiratory: Negative for cough and shortness of breath.   Cardiovascular: Negative for leg swelling.  Gastrointestinal: Negative for constipation and diarrhea.  Genitourinary: Negative for difficulty urinating.  Musculoskeletal: Positive for back pain and neck pain.  Skin: Negative for rash.  Allergic/Immunologic: Negative for food allergies.  Neurological: Positive for weakness. Negative for numbness.  Hematological: Bruises/bleeds easily.  Psychiatric/Behavioral: Positive for sleep disturbance.     Objective: Vital Signs: BP 130/63 (BP Location: Left Arm, Patient Position: Sitting, Cuff Size: Normal)   Pulse (!) 51   Ht 6' (1.829 m)   Wt 204 lb (92.5 kg)   BMI 27.67 kg/m   Physical Exam  Constitutional: He is oriented to person, place, and time. He appears well-developed and well-nourished.  HENT:  Mouth/Throat: Oropharynx is clear and moist.  Eyes: Pupils are equal, round, and reactive to light. EOM are normal.  Pulmonary/Chest: Effort normal.  Neurological: He is alert and oriented to person, place, and time.  Skin: Skin is warm and dry.  Psychiatric: He has a normal mood and affect. His behavior is normal.    Ortho Exam awake alert and oriented x3.  Hearing aids.  Comfortable sitting.  Negative impingement and empty can testing right shoulder.  No pain with any motion.  Full overhead motion and abduction.  No evidence of adhesive capsulitis.  Diffuse degenerative changes of the joints of his hand capillary refill.  Good sensibility.  Able to touch his chin to his chest but that motion reproduces the pain in the area of his right scapula.  Neck extension did not produce any pain to the right shoulder or to the right upper extremity  but did reproduce some of the pain in the parascapular region on the right.  No localized areas of tenderness about the right shoulder or scapula.  Specialty Comments:  No specialty comments available.  Imaging: Xr Cervical Spine 2 Or 3 Views  Result Date: 02/19/2018 Of the cervical spine obtained in 2 projections.  There is at least a 30% anterior listhesis of C7 on T1.  Joint space is completely obliterated.  There are degenerative changes at C5-6 and C6-7 without listhesis.  Fused facet arthropathy from C4 7    PMFS History: Patient Active Problem List   Diagnosis Date Noted  . Neck pain 02/19/2018  . IDA (iron deficiency anemia) 08/09/2016  . Thrombocytopenia ([place]) 05/30/2016  . Deficiency anemia 05/04/2016  . Diabetes mellitus type 2, controlled, with complications ([place]) 70/62/3762  . Right inguinal hernia 02/03/2016  . History of colon polyps 01/27/2016  . History of ST elevation myocardial infarction (STEMI) 01/27/2016  . CAD (coronary artery disease) 01/27/2016  . Hyperlipidemia 01/27/2016  . Anemia in neoplastic disease 05/25/2014  . Splenic marginal zone b-cell lymphoma ([place]) 12/18/2011  . HTN (hypertension)    Past Medical History:  Diagnosis Date  . Anemia   . Arthritis   . Colonic polyp   . Coronary artery disease    stents x 3 (03-2014)  . Diabetes mellitus   . Diverticulosis    throughout entire colon  . HTN (hypertension)   . Hypertriglyceridemia   . Kidney stone   . Lymphocytosis   . Metatarsal fracture left   left 5th proximal phalanx   . OSA (obstructive sleep apnea)    "Mild" by records  . PONV (postoperative nausea and vomiting)   . RBBB 01/2016  . Splenic marginal zone b-cell lymphoma ([place]) 12/18/2011  . STEMI (ST elevation myocardial infarction) [facility])    August 2015    Family History  Problem Relation Age of Onset  . Kidney disease Sister   . Colon cancer Sister 36  . Colon cancer Brother   . Arthritis Mother   . Breast cancer Mother   .  Heart disease Mother   . Bladder Cancer Brother   . Arthritis Son   . Esophageal cancer Sister   . Rectal cancer Neg Hx   . Stomach cancer Neg Hx     Past Surgical History:  Procedure Laterality Date  . CARDIAC CATHETERIZATION     stents x 3 (03/2014)  . CHOLECYSTECTOMY    . HERNIA REPAIR    . INGUINAL HERNIA REPAIR Right 05/27/2015   Procedure: LAPAROSCOPIC REPAIR RIGHT  INGUINAL HERNIA;  Surgeon: Greer Pickerel, MD;  Location: [facility];  Service: General;  Laterality: Right;  . INSERTION OF MESH Right 05/27/2015   Procedure: INSERTION OF MESH;  Surgeon: Greer Pickerel, MD;  Location: [facility];  Service: General;  Laterality: Right;  . LUMBAR DISC SURGERY    . 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 Smoking status: Former Smoker    Packs/day: 1.00    Years: 20.00    Pack years: 20.00    Last attempt to quit: 08/14/1988    Years since quitting: 29.5  . Smokeless tobacco: Never Used  Substance and Sexual Activity  . Alcohol use: No  . Drug use: No  . Sexual activity: Yes    Birth control/protection: None

## 2018-03-05 ENCOUNTER — Ambulatory Visit
Admission: RE | Admit: 2018-03-05 | Discharge: 2018-03-05 | Disposition: A | Discharge status: Home / Self Care | Payer: Medicare Other | Source: Ambulatory Visit | Attending: Orthopaedic Surgery | Admitting: Orthopaedic Surgery

## 2018-03-05 ENCOUNTER — Other Ambulatory Visit: Payer: Self-pay

## 2018-03-05 ENCOUNTER — Emergency Department (HOSPITAL_COMMUNITY): Payer: Medicare Other

## 2018-03-05 ENCOUNTER — Encounter (HOSPITAL_COMMUNITY): Payer: Self-pay

## 2018-03-05 ENCOUNTER — Observation Stay (HOSPITAL_COMMUNITY)
Admission: EM | Admit: 2018-03-05 | Discharge: 2018-03-06 | Disposition: A | Discharge status: Home / Self Care | Payer: Medicare Other | Attending: Internal Medicine | Admitting: Internal Medicine

## 2018-03-05 DIAGNOSIS — R410 Disorientation, unspecified: Secondary | ICD-10-CM | POA: Diagnosis not present

## 2018-03-05 DIAGNOSIS — I499 Cardiac arrhythmia, unspecified: Secondary | ICD-10-CM | POA: Diagnosis not present

## 2018-03-05 DIAGNOSIS — R161 Splenomegaly, not elsewhere classified: Secondary | ICD-10-CM

## 2018-03-05 DIAGNOSIS — W0110XA Fall on same level from slipping, tripping and stumbling with subsequent striking against unspecified object, initial encounter: Secondary | ICD-10-CM | POA: Insufficient documentation

## 2018-03-05 DIAGNOSIS — S062X0A Diffuse traumatic brain injury without loss of consciousness, initial encounter: Principal | ICD-10-CM

## 2018-03-05 DIAGNOSIS — W19XXXA Unspecified fall, initial encounter: Secondary | ICD-10-CM | POA: Diagnosis not present

## 2018-03-05 DIAGNOSIS — R413 Other amnesia: Secondary | ICD-10-CM

## 2018-03-05 DIAGNOSIS — R51 Headache: Secondary | ICD-10-CM | POA: Diagnosis not present

## 2018-03-05 DIAGNOSIS — S098XXA Other specified injuries of head, initial encounter: Secondary | ICD-10-CM | POA: Diagnosis present

## 2018-03-05 DIAGNOSIS — I2510 Atherosclerotic heart disease of native coronary artery without angina pectoris: Secondary | ICD-10-CM | POA: Diagnosis present

## 2018-03-05 DIAGNOSIS — M542 Cervicalgia: Secondary | ICD-10-CM

## 2018-03-05 DIAGNOSIS — R109 Unspecified abdominal pain: Secondary | ICD-10-CM

## 2018-03-05 DIAGNOSIS — Y92008 Other place in unspecified non-institutional (private) residence as the place of occurrence of the external cause: Secondary | ICD-10-CM | POA: Insufficient documentation

## 2018-03-05 DIAGNOSIS — S060X9A Concussion with loss of consciousness of unspecified duration, initial encounter: Secondary | ICD-10-CM

## 2018-03-05 DIAGNOSIS — S060XAA Concussion with loss of consciousness status unknown, initial encounter: Secondary | ICD-10-CM

## 2018-03-05 DIAGNOSIS — S199XXA Unspecified injury of neck, initial encounter: Secondary | ICD-10-CM | POA: Diagnosis not present

## 2018-03-05 DIAGNOSIS — W1830XA Fall on same level, unspecified, initial encounter: Secondary | ICD-10-CM

## 2018-03-05 DIAGNOSIS — I10 Essential (primary) hypertension: Secondary | ICD-10-CM | POA: Diagnosis not present

## 2018-03-05 DIAGNOSIS — I4510 Unspecified right bundle-branch block: Secondary | ICD-10-CM | POA: Diagnosis not present

## 2018-03-05 DIAGNOSIS — E119 Type 2 diabetes mellitus without complications: Secondary | ICD-10-CM | POA: Diagnosis not present

## 2018-03-05 DIAGNOSIS — C8307 Small cell B-cell lymphoma, spleen: Secondary | ICD-10-CM | POA: Diagnosis present

## 2018-03-05 DIAGNOSIS — Y999 Unspecified external cause status: Secondary | ICD-10-CM | POA: Insufficient documentation

## 2018-03-05 DIAGNOSIS — S0990XA Unspecified injury of head, initial encounter: Secondary | ICD-10-CM | POA: Diagnosis not present

## 2018-03-05 DIAGNOSIS — R55 Syncope and collapse: Secondary | ICD-10-CM | POA: Diagnosis not present

## 2018-03-05 DIAGNOSIS — E785 Hyperlipidemia, unspecified: Secondary | ICD-10-CM | POA: Diagnosis present

## 2018-03-05 DIAGNOSIS — R0781 Pleurodynia: Secondary | ICD-10-CM | POA: Diagnosis not present

## 2018-03-05 DIAGNOSIS — D509 Iron deficiency anemia, unspecified: Secondary | ICD-10-CM | POA: Diagnosis present

## 2018-03-05 DIAGNOSIS — D630 Anemia in neoplastic disease: Secondary | ICD-10-CM | POA: Diagnosis present

## 2018-03-05 DIAGNOSIS — I609 Nontraumatic subarachnoid hemorrhage, unspecified: Secondary | ICD-10-CM

## 2018-03-05 DIAGNOSIS — D696 Thrombocytopenia, unspecified: Secondary | ICD-10-CM | POA: Diagnosis present

## 2018-03-05 DIAGNOSIS — M4802 Spinal stenosis, cervical region: Secondary | ICD-10-CM | POA: Diagnosis not present

## 2018-03-05 DIAGNOSIS — Z8673 Personal history of transient ischemic attack (TIA), and cerebral infarction without residual deficits: Secondary | ICD-10-CM | POA: Diagnosis not present

## 2018-03-05 DIAGNOSIS — Y939 Activity, unspecified: Secondary | ICD-10-CM | POA: Insufficient documentation

## 2018-03-05 HISTORY — DX: Concussion with loss of consciousness of unspecified duration, initial encounter: S06.0X9A

## 2018-03-05 HISTORY — DX: Nontraumatic subarachnoid hemorrhage, unspecified: I60.9

## 2018-03-05 HISTORY — DX: Concussion with loss of consciousness status unknown, initial encounter: S06.0XAA

## 2018-03-05 LAB — COMPREHENSIVE METABOLIC PANEL
ALBUMIN: 4 g/dL (ref 3.5–5.0)
ALK PHOS: 73 U/L (ref 38–126)
ALT: 24 U/L (ref 0–44)
ANION GAP: 8 (ref 5–15)
AST: 38 U/L (ref 15–41)
BILIRUBIN TOTAL: 0.6 mg/dL (ref 0.3–1.2)
BUN: 17 mg/dL (ref 8–23)
CALCIUM: 9 mg/dL (ref 8.9–10.3)
CO2: 25 mmol/L (ref 22–32)
Chloride: 108 mmol/L (ref 98–111)
Creatinine, Ser: 0.81 mg/dL (ref 0.61–1.24)
GFR calc non Af Amer: >60 mL/min (ref >60)
Glucose, Bld: 96 mg/dL (ref 70–99)
POTASSIUM: 4.2 mmol/L (ref 3.5–5.1)
Sodium: 141 mmol/L (ref 135–145)
TOTAL PROTEIN: 5.9 g/dL — AB (ref 6.5–8.1)

## 2018-03-05 LAB — CBC
HCT: 40 % (ref 39.0–52.0)
HEMATOCRIT: 42.9 % (ref 39.0–52.0)
HEMOGLOBIN: 13.5 g/dL (ref 13.0–17.0)
Hemoglobin: 12.6 g/dL — ABNORMAL LOW (ref 13.0–17.0)
MCH: 27.8 pg (ref 26.0–34.0)
MCH: 27.9 pg (ref 26.0–34.0)
MCHC: 31.5 g/dL (ref 30.0–36.0)
MCHC: 31.5 g/dL (ref 30.0–36.0)
MCV: 88.3 fL (ref 78.0–100.0)
MCV: 88.7 fL (ref 78.0–100.0)
Platelets: 161 10*3/uL (ref 150–400)
Platelets: 150 K/uL (ref 150–400)
RBC: 4.86 MIL/uL (ref 4.22–5.81)
RBC: 4.51 MIL/uL (ref 4.22–5.81)
RDW: 16.7 % — AB (ref 11.5–15.5)
RDW: 16.7 % — ABNORMAL HIGH (ref 11.5–15.5)
WBC: 18 10*3/uL — ABNORMAL HIGH (ref 4.0–10.5)
WBC: 17 K/uL — ABNORMAL HIGH (ref 4.0–10.5)

## 2018-03-05 LAB — HEMOGLOBIN A1C
Hgb A1c MFr Bld: 6.2 % — ABNORMAL HIGH (ref 4.8–5.6)
Mean Plasma Glucose: 131.24 mg/dL

## 2018-03-05 LAB — URINALYSIS, ROUTINE W REFLEX MICROSCOPIC
Bilirubin Urine: NEGATIVE
Glucose, UA: NEGATIVE mg/dL
Hgb urine dipstick: NEGATIVE
Ketones, ur: NEGATIVE mg/dL
LEUKOCYTES UA: NEGATIVE
NITRITE: NEGATIVE
PH: 7 (ref 5.0–8.0)
Protein, ur: 30 mg/dL — AB
Specific Gravity, Urine: 1.012 (ref 1.005–1.030)

## 2018-03-05 LAB — CREATININE, SERUM
Creatinine, Ser: 1.03 mg/dL (ref 0.61–1.24)
GFR calc Af Amer: >60 mL/min
GFR calc non Af Amer: >60 mL/min

## 2018-03-05 LAB — MAGNESIUM: Magnesium: 2.1 mg/dL (ref 1.7–2.4)

## 2018-03-05 MED ORDER — LEVALBUTEROL HCL 0.63 MG/3ML IN NEBU
0.6300 mg | INHALATION_SOLUTION | Freq: Four times a day (QID) | RESPIRATORY_TRACT | Status: DC
  Filled 2018-03-05: qty 3

## 2018-03-05 MED ORDER — LEVALBUTEROL HCL 0.63 MG/3ML IN NEBU: 0.6300 mg | INHALATION_SOLUTION | Freq: Four times a day (QID) | RESPIRATORY_TRACT | Status: DC | PRN

## 2018-03-05 MED ORDER — ONDANSETRON HCL 4 MG PO TABS: 4.0000 mg | ORAL_TABLET | Freq: Four times a day (QID) | ORAL | Status: DC | PRN

## 2018-03-05 MED ORDER — ACETAMINOPHEN 325 MG PO TABS
650.0000 mg | ORAL_TABLET | Freq: Once | ORAL | Status: AC
  Administered 2018-03-05: 650 mg via ORAL
  Filled 2018-03-05: qty 2

## 2018-03-05 MED ORDER — HEPARIN SODIUM (PORCINE) 5000 UNIT/ML IJ SOLN
5000.0000 [IU] | Freq: Three times a day (TID) | INTRAMUSCULAR | Status: DC
  Filled 2018-03-05: qty 1

## 2018-03-05 MED ORDER — ACETAMINOPHEN 650 MG RE SUPP
650.0000 mg | Freq: Four times a day (QID) | RECTAL | Status: DC | PRN
Start: 2018-03-05 — End: 2018-03-06

## 2018-03-05 MED ORDER — ATORVASTATIN CALCIUM 40 MG PO TABS
40.0000 mg | ORAL_TABLET | Freq: Every day | ORAL | Status: DC
  Filled 2018-03-05: qty 1

## 2018-03-05 MED ORDER — METOPROLOL TARTRATE 12.5 MG HALF TABLET
12.5000 mg | ORAL_TABLET | Freq: Two times a day (BID) | ORAL | Status: DC
  Administered 2018-03-05 – 2018-03-06 (×2): 12.5 mg via ORAL
  Filled 2018-03-05 (×2): qty 1

## 2018-03-05 MED ORDER — SODIUM CHLORIDE 0.9 % IV SOLN
INTRAVENOUS | Status: DC
  Administered 2018-03-05: 23:00:00 via INTRAVENOUS

## 2018-03-05 MED ORDER — INSULIN ASPART 100 UNIT/ML ~~LOC~~ SOLN
0.0000 [IU] | Freq: Three times a day (TID) | SUBCUTANEOUS | Status: DC
  Administered 2018-03-06: 2 [IU] via SUBCUTANEOUS

## 2018-03-05 MED ORDER — FERROUS SULFATE 325 (65 FE) MG PO TABS
325.0000 mg | ORAL_TABLET | Freq: Every day | ORAL | Status: DC
  Administered 2018-03-06: 325 mg via ORAL
  Filled 2018-03-05: qty 1

## 2018-03-05 MED ORDER — ACETAMINOPHEN 325 MG PO TABS
650.0000 mg | ORAL_TABLET | Freq: Four times a day (QID) | ORAL | Status: DC | PRN
  Administered 2018-03-05: 650 mg via ORAL
  Filled 2018-03-05: qty 2

## 2018-03-05 MED ORDER — ONDANSETRON HCL 4 MG/2ML IJ SOLN: 4.0000 mg | Freq: Four times a day (QID) | INTRAMUSCULAR | Status: DC | PRN

## 2018-03-05 NOTE — ED Notes (Signed)
Attempted report 

## 2018-03-05 NOTE — ED Notes (Signed)
Patient transported to CT 

## 2018-03-05 NOTE — H&P (Addendum)
[facility] History and Physical  Marcus Taylor YYT:[id number] DOB: [birth year] DOA: 03/05/2018  Referring physician:   PCP: [name], DO   Chief Complaint:  Fall and contusion  HPI:   77 year old male with a history of coronary artery disease, PACs, chronic bradycardia with heart rate in the 40s and 50s, followed by [name], MD ,history of splenic/marginal zone lymphoma followed by Dr. [name], MD, who presented to the ED today after a fall and hitting his head and bouncing his head off the floor when he fell, without any loss of consciousness. Patient states that he was trying to clean out his closet this morning for some work being done at his house. Earlier today he also had an MRI of the C-spine at [facility] radiology because of bilateral shoulder pain that he has been experiencing.he denies any chest pain, palpitations. He has a history of known bradycardia with resting heart rate in the 40s to 60s. He is followed by cardiologist in [place]. Recently also been having left flank pain and was noted to have splenomegaly on a bedside ultrasound today and on the CT scan in the ED Patient complains of a headache but no blurry vision or unilateral weakness or slurred speech He is conversive alert and oriented. He was noted to have some retrograde amnesia after the fall by his family who brought him in today. ED course Vital Signs and Nursing Notes reviewed Vitals:   03/05/18 1245 03/05/18 1315  BP: (!) 167/73 (!) 148/108  Pulse: 60 (!) 50  Resp: 15 19  Temp:    SpO2: 95% 97%  CT of the head showed a small focus of attenuation consistent with a focus of contusion. No other hemorrhage. CT of the cervical spine showed multilevel degenerative changes CT of the abdomen shows splenomegaly without evidence of any significant splenic laceration or hemoperitoneum Patient is being in for observation for his contusion as the patient is on aspirin and  Plavix       Review of Systems: negative for the following  Constitutional: Denies fever, chills, diaphoresis, appetite change and fatigue.  HEENT: Denies photophobia, eye pain, redness, hearing loss, ear pain, congestion, sore throat, rhinorrhea, sneezing, mouth sores, trouble swallowing, neck pain, neck stiffness and tinnitus.  Respiratory: Denies SOB, DOE, cough, chest tightness, and wheezing.  Cardiovascular: Denies chest pain, palpitations and leg swelling.  Gastrointestinal: Denies nausea, vomiting, abdominal pain, diarrhea, constipation, blood in stool and abdominal distention.  Genitourinary: Denies dysuria, urgency, frequency, hematuria, flank pain and difficulty urinating.  Musculoskeletal: Denies myalgias, back pain, joint swelling, arthralgias and gait problem.  Skin: Denies pallor, rash and wound.  Neurological: Denies dizziness, seizures, syncope, weakness, light-headedness, positive for headaches.  Hematological: Denies adenopathy. Easy bruising, personal or family bleeding history  Psychiatric/Behavioral: Denies suicidal ideation, mood changes, confusion, nervousness, sleep disturbance and agitation       Past Medical History:  Diagnosis Date  . Anemia   . Arthritis   . Colonic polyp   . Coronary artery disease    stents x 3 (03-2014)  . Diabetes mellitus   . Diverticulosis    throughout entire colon  . HTN (hypertension)   . Hypertriglyceridemia   . Kidney stone   . Lymphocytosis   . Metatarsal fracture left   left 5th proximal phalanx   . OSA (obstructive sleep apnea)    "Mild" by records  . PONV (postoperative nausea and vomiting)   . RBBB 01/2016  . Splenic marginal zone b-cell lymphoma (  [place]) 12/18/2011  . STEMI (ST elevation myocardial infarction) [facility])    August 2015     Past Surgical History:  Procedure Laterality Date  . CARDIAC CATHETERIZATION     stents x 3 (03/2014)  . CHOLECYSTECTOMY    . HERNIA REPAIR    . INGUINAL HERNIA REPAIR Right  05/27/2015   Procedure: LAPAROSCOPIC REPAIR RIGHT  INGUINAL HERNIA;  Surgeon: Greer Pickerel, MD;  Location: [facility] ORS;  Service: General;  Laterality: Right;  . INSERTION OF MESH Right 05/27/2015   Procedure: INSERTION OF MESH;  Surgeon: Greer Pickerel, MD;  Location: [facility] ORS;  Service: General;  Laterality: Right;  . LUMBAR DISC SURGERY    . TONSILLECTOMY AND ADENOIDECTOMY        Social History:  reports that he quit smoking about 29 years ago. He has a 20.00 pack-year smoking history. He has never used smokeless tobacco. He reports that he does not drink alcohol or use drugs.    No Known Allergies  Family History  Problem Relation Age of Onset  . Kidney disease Sister   . Colon cancer Sister 74  . Colon cancer Brother   . Arthritis Mother   . Breast cancer Mother   . Heart disease Mother   . Bladder Cancer Brother   . Arthritis Son   . Esophageal cancer Sister   . Rectal cancer Neg Hx   . Stomach cancer Neg Hx       Prior to Admission medications   Medication Sig Start Date End Date Taking? Authorizing Provider  aspirin EC 81 MG tablet Take 81 mg by mouth every morning.    Yes [provider]  atorvastatin (LIPITOR) 40 MG tablet Take 1 tablet (40 mg total) by mouth daily. 01/18/18  Yes Kuneff, Renee A, DO  clopidogrel (PLAVIX) 75 MG tablet Take 1 tablet (75 mg total) by mouth daily. Needs office visit prior to any additional refills 01/17/18  Yes Kuneff, Renee A, DO  ferrous sulfate 325 (65 FE) MG tablet Take 325 mg by mouth daily with breakfast.   Yes [provider]  lisinopril (PRINIVIL,ZESTRIL) 20 MG tablet Take 1 tablet (20 mg total) by mouth daily. 01/17/18  Yes Kuneff, Renee A, DO  metFORMIN (GLUCOPHAGE-XR) 500 MG 24 hr tablet Take 1 tablet (500 mg total) by mouth daily with breakfast. TAKE 1 TABLET BY MOUTH WITH EVENING MEAL M-W-F Patient taking differently: Take 500 mg by mouth every Monday, Wednesday, and Friday at 6 PM.  01/17/18  Yes Kuneff, Renee A, DO   metoprolol tartrate (LOPRESSOR) 25 MG tablet Take 0.5 tablets (12.5 mg total) by mouth 2 (two) times daily. 01/17/18  Yes Kuneff, Renee A, DO  nitroGLYCERIN (NITROSTAT) 0.4 MG SL tablet Place 1 tablet (0.4 mg total) under the tongue every 5 (five) minutes as needed for chest pain. 01/17/18  Yes Howard Pouch A, DO     Physical Exam: Vitals:   03/05/18 1415 03/05/18 1430 03/05/18 1500 03/05/18 1515  BP: (!) 146/70 (!) 172/78 116/79 130/69  Pulse: (!) 57 (!) 55 (!) 50   Resp: (!) 21 13 20    Temp:      TempSrc:      SpO2: 97% 98% 100% 98%  Weight:      Height:            Vitals:   03/05/18 1415 03/05/18 1430 03/05/18 1500 03/05/18 1515  BP: (!) 146/70 (!) 172/78 116/79 130/69  Pulse: (!) 57 (!) 55 (!) 50   Resp: Marland Kitchen)  21 13 20    Temp:      TempSrc:      SpO2: 97% 98% 100% 98%  Weight:      Height:       Constitutional: NAD, calm, comfortable Eyes: PERRL, lids and conjunctivae normal ENMT: Mucous membranes are moist. Posterior pharynx clear of any exudate or lesions.Normal dentition.  Neck: normal, supple, no masses, no thyromegaly Respiratory: clear to auscultation bilaterally, no wheezing, no crackles. Normal respiratory effort. No accessory muscle use.  Cardiovascular: Regular rate and rhythm, no murmurs / rubs / gallops. No extremity edema. 2+ pedal pulses. No carotid bruits.  Abdomen: no tenderness, no masses palpated. No hepatosplenomegaly. Bowel sounds positive.  Musculoskeletal: no clubbing / cyanosis. No joint deformity upper and lower extremities. Good ROM, no contractures. Normal muscle tone.  Skin: no rashes, lesions, ulcers. No induration Neurologic: CN 2-12 grossly intact. Sensation intact, DTR normal. Strength 5/5 in all 4.  Psychiatric: Normal judgment and insight. Alert and oriented x 3. Normal mood.     Labs on Admission: I have personally reviewed following labs and imaging studies  CBC: Recent Labs  Lab 03/05/18 1313  WBC 18.0*  HGB 13.5  HCT 42.9   MCV 88.3  PLT 948    Basic Metabolic Panel: Recent Labs  Lab 03/05/18 1313  NA 141  K 4.2  CL 108  CO2 25  GLUCOSE 96  BUN 17  CREATININE 0.81  CALCIUM 9.0    GFR: Estimated Creatinine Clearance: 85.2 mL/min (by C-G formula based on SCr of 0.81 mg/dL).  Liver Function Tests: Recent Labs  Lab 03/05/18 1313  AST 38  ALT 24  ALKPHOS 73  BILITOT 0.6  PROT 5.9*  ALBUMIN 4.0   No results for input(s): LIPASE, AMYLASE in the last 168 hours. No results for input(s): AMMONIA in the last 168 hours.  Coagulation Profile: No results for input(s): INR, PROTIME in the last 168 hours. No results for input(s): DDIMER in the last 72 hours.  Cardiac Enzymes: No results for input(s): CKTOTAL, CKMB, CKMBINDEX, TROPONINI in the last 168 hours.  BNP (last 3 results) No results for input(s): PROBNP in the last 8760 hours.  HbA1C: No results for input(s): HGBA1C in the last 72 hours. Lab Results  Component Value Date   HGBA1C 5.7 01/17/2018   HGBA1C 5.9 07/19/2017   HGBA1C 5.9 (H) 03/13/2017     CBG: No results for input(s): GLUCAP in the last 168 hours.  Lipid Profile: No results for input(s): CHOL, HDL, LDLCALC, TRIG, CHOLHDL, LDLDIRECT in the last 72 hours.  Thyroid Function Tests: No results for input(s): TSH, T4TOTAL, FREET4, T3FREE, THYROIDAB in the last 72 hours.  Anemia Panel: No results for input(s): VITAMINB12, FOLATE, FERRITIN, TIBC, IRON, RETICCTPCT in the last 72 hours.  Urine analysis:    Component Value Date/Time   COLORURINE YELLOW 03/05/2018 [place] 03/05/2018 1247   LABSPEC 1.012 03/05/2018 1247   PHURINE 7.0 03/05/2018 1247   GLUCOSEU NEGATIVE 03/05/2018 1247   HGBUR NEGATIVE 03/05/2018 1247   BILIRUBINUR NEGATIVE 03/05/2018 1247   KETONESUR NEGATIVE 03/05/2018 1247   PROTEINUR 30 (A) 03/05/2018 1247   NITRITE NEGATIVE 03/05/2018 1247   LEUKOCYTESUR NEGATIVE 03/05/2018 1247    Sepsis  Labs: @LABRCNTIP (procalcitonin:4,lacticidven:4) )No results found for this or any previous visit (from the past 240 hour(s)).       Radiological Exams on Admission: Ct Abdomen Pelvis Wo Contrast  Result Date: 03/05/2018 CLINICAL DATA:  Chronic flank pain with splenomegaly. Recent  fall and need to rule out intra-abdominal hemorrhage. History of B-cell lymphoma. EXAM: CT ABDOMEN AND PELVIS WITHOUT CONTRAST TECHNIQUE: Multidetector CT imaging of the abdomen and pelvis was performed following the standard protocol without IV contrast. COMPARISON:  None. FINDINGS: Lower chest: Linear and patchy densities in left lower lobe are most compatible with atelectasis. No significant pleural fluid. Evidence for coronary artery calcifications. Hepatobiliary: Cholecystectomy. No gross abnormality to the liver on this non contrast examination. Pancreas: Unremarkable. No pancreatic ductal dilatation or surrounding inflammatory changes. Spleen: Splenomegaly. Spleen measures greater than 22 cm in the craniocaudal length. Question trace fluid along the caudal aspect of the spleen but no significant perisplenic fluid or hemorrhage. Adrenals/Urinary Tract: Normal adrenal glands. Mild distention of the urinary bladder. Mild fullness of the right renal collecting system without hydronephrosis. No evidence for kidney or ureter stones. Stomach/Bowel: Large amount of stool in the colon, particularly in the rectum. Colonic diverticulosis without acute inflammation. No evidence for bowel obstruction or focal bowel inflammation. Normal appearance of stomach and small bowel. Vascular/Lymphatic: Atherosclerotic calcifications involving the aorta and iliac arteries without aortic aneurysm. No significant lymph node enlargement in the abdomen or pelvis. Reproductive: Prostate is enlarged measuring 6.1 cm in transverse dimension. Other: No significant free fluid in the abdomen or pelvis. No evidence for hemoperitoneum. Minimal stranding and  questionable trace fluid along the caudal aspect of spleen. Negative for free air. Musculoskeletal: Laminectomy changes at L4. Chronic disc space loss at L4-L5. Motion artifact on this examination. IMPRESSION: No acute abnormality in the abdomen or pelvis. Splenomegaly without evidence for a significant splenic laceration or hemoperitoneum. There is trace stranding or fluid along the caudal aspect of the spleen but the chronicity is unknown. Study has limitations from motion artifact and lack of intravenous contrast. Prostate enlargement. Electronically Signed   By: Markus Daft M.D.   On: 03/05/2018 14:47   Dg Chest 2 View  Result Date: 03/05/2018 CLINICAL DATA:  Left posterior rib and flank pain for 2-3 weeks. EXAM: CHEST - 2 VIEW COMPARISON:  01/16/2008 FINDINGS: Normal heart size. Aortic tortuosity. There is no edema, consolidation, effusion, or pneumothorax. Artifact from EKG leads. Spondylosis. No acute osseous finding. IMPRESSION: No evidence of active disease. Electronically Signed   By: Monte Fantasia M.D.   On: 03/05/2018 13:50   Ct Head Wo Contrast  Result Date: 03/05/2018 CLINICAL DATA:  Recent fall with headaches and neck pain, initial encounter EXAM: CT HEAD WITHOUT CONTRAST CT CERVICAL SPINE WITHOUT CONTRAST TECHNIQUE: Multidetector CT imaging of the head and cervical spine was performed following the standard protocol without intravenous contrast. Multiplanar CT image reconstructions of the cervical spine were also generated. COMPARISON:  09/20/2006 CT of the head, 02/19/2018 FINDINGS: CT HEAD FINDINGS Brain: Mild atrophic changes are noted. A small focus of increased attenuation is noted along the falx re-re- just to the right of the midline. This is best seen on image number 24 of series 4 and on the coronal reconstructions on image number 26 of series 6 and on the sagittal reconstructions image 34 of series 7. No other areas of hemorrhage are identified. No findings to suggest infarction or  space-occupying mass lesion are seen. Vascular: No hyperdense vessel or unexpected calcification. Skull: Normal. Negative for fracture or focal lesion. Sinuses/Orbits: Sinuses demonstrates some mild mucosal changes in the ethmoid sinuses as well as mucosal retention cysts in the maxillary antra bilaterally. Other: None CT CERVICAL SPINE FINDINGS Alignment: Alignment is well maintained in the cervical spine. Some anterolisthesis of  C7 on T1 of a degenerative nature is noted stable from the previous exam. Skull base and vertebrae: 7 cervical segments are well visualized. Vertebral body height is well maintained. Multilevel disc space narrowing with associated osteophytic change is identified. Facet hypertrophic changes are noted throughout the cervical spine. The odontoid is within normal limits. No acute fracture or acute facet abnormality is identified. Soft tissues and spinal canal: Chronic calcifications are noted. No other soft tissue abnormality is seen. The vascular changes on recent MRI examination likely represents volume averaging through the junction of the right jugular and right subclavian vein. No definitive vascular mass is noted although contrast was not administered for this exam. Upper chest: There is an aberrant right subclavian artery identified. The remainder of the upper chest is within normal limits. Other: None IMPRESSION: CT of the head: Small focus of increased attenuation along the falx review to the right of the midline as described above. This likely represents a small focus of contusion. No other significant hemorrhage is identified. Mild atrophic changes. Sinus disease as described. CT of the cervical spine: Multilevel degenerative changes similar to that seen on recent MRI from earlier in the same day. No acute fracture is noted. Aberrant right subclavian artery. The venous malformation suggested on recent MRI is felt to represent the confluence of the right jugular and subclavian veins  incompletely imaged on the prior exam. Electronically Signed   By: Inez Catalina M.D.   On: 03/05/2018 14:46   Ct Cervical Spine Wo Contrast  Result Date: 03/05/2018 CLINICAL DATA:  Recent fall with headaches and neck pain, initial encounter EXAM: CT HEAD WITHOUT CONTRAST CT CERVICAL SPINE WITHOUT CONTRAST TECHNIQUE: Multidetector CT imaging of the head and cervical spine was performed following the standard protocol without intravenous contrast. Multiplanar CT image reconstructions of the cervical spine were also generated. COMPARISON:  09/20/2006 CT of the head, 02/19/2018 FINDINGS: CT HEAD FINDINGS Brain: Mild atrophic changes are noted. A small focus of increased attenuation is noted along the falx re-re- just to the right of the midline. This is best seen on image number 24 of series 4 and on the coronal reconstructions on image number 26 of series 6 and on the sagittal reconstructions image 34 of series 7. No other areas of hemorrhage are identified. No findings to suggest infarction or space-occupying mass lesion are seen. Vascular: No hyperdense vessel or unexpected calcification. Skull: Normal. Negative for fracture or focal lesion. Sinuses/Orbits: Sinuses demonstrates some mild mucosal changes in the ethmoid sinuses as well as mucosal retention cysts in the maxillary antra bilaterally. Other: None CT CERVICAL SPINE FINDINGS Alignment: Alignment is well maintained in the cervical spine. Some anterolisthesis of C7 on T1 of a degenerative nature is noted stable from the previous exam. Skull base and vertebrae: 7 cervical segments are well visualized. Vertebral body height is well maintained. Multilevel disc space narrowing with associated osteophytic change is identified. Facet hypertrophic changes are noted throughout the cervical spine. The odontoid is within normal limits. No acute fracture or acute facet abnormality is identified. Soft tissues and spinal canal: Chronic calcifications are noted. No  other soft tissue abnormality is seen. The vascular changes on recent MRI examination likely represents volume averaging through the junction of the right jugular and right subclavian vein. No definitive vascular mass is noted although contrast was not administered for this exam. Upper chest: There is an aberrant right subclavian artery identified. The remainder of the upper chest is within normal limits. Other: None IMPRESSION: CT  of the head: Small focus of increased attenuation along the falx review to the right of the midline as described above. This likely represents a small focus of contusion. No other significant hemorrhage is identified. Mild atrophic changes. Sinus disease as described. CT of the cervical spine: Multilevel degenerative changes similar to that seen on recent MRI from earlier in the same day. No acute fracture is noted. Aberrant right subclavian artery. 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 Electronically Signed   By: Inez Catalina M.D.   On: 03/05/2018 14:46   Mr Cervical Spine W/o Contrast  Result Date: 03/05/2018 CLINICAL DATA:  77 year old male with right side neck and shoulder pain for 3 weeks with no known injury. EXAM: MRI CERVICAL SPINE WITHOUT CONTRAST TECHNIQUE: Multiplanar, multisequence MR imaging of the cervical spine was performed. No intravenous contrast was administered. COMPARISON:  Cervical spine radiographs 02/19/2018. FINDINGS: Alignment: Stable from the recent radiographs. Moderate degenerative appearing anterolisthesis of C7 on T1. Mild degenerative anterolisthesis of C4 on C5. Mild superimposed straightening of cervical lordosis. Vertebrae: Visualized bone marrow signal is within normal limits. Intermittent degenerative endplate marrow signal changes, maximal at C6-C7 and C7-T1. No marrow edema or evidence of acute osseous abnormality. Cord:  No definite abnormal spinal cord signal despite stenosis with spinal cord mass effect at C3-C4, details below. The visible upper thoracic spinal cord is within normal limits. Posterior Fossa, vertebral arteries, paraspinal tissues: Cervicomedullary junction is within normal limits. Negative visible brain parenchyma. Dominant and mildly dolichoectatic left vertebral artery, the right is diminutive. The major vascular flow voids in the neck are preserved. There is lobular asymmetric and indeterminate appearing signal abnormality at the right thoracic inlet lateral to the right thyroid seen on series 6, image 30. This is not included on the sagittal images. Etiology is unclear, but these seem to be dilated venous vascular structures. No significant regional mass effect. Disc levels: C2-C3: Mild to moderate facet hypertrophy greater on the right. Mild broad-based posterior disc bulging. Mild ligament flavum hypertrophy. Mild spinal stenosis. No definite spinal cord mass effect. Moderate bilateral C3 foraminal stenosis. C3-C4: Mild left but moderate to severe right side facet hypertrophy with severe ligament flavum hypertrophy. Superimposed circumferential disc bulging and endplate spurring with a broad-based posterior component. Spinal stenosis with mild to moderate spinal cord mass effect. Moderate to severe left and severe right C4 foraminal stenosis. C4-C5: Moderate facet and ligament flavum hypertrophy greater on the right. Superimposed circumferential disc bulge and endplate spurring with a small broad-based central disc protrusion. Mild spinal stenosis with no cord mass effect. Mild to moderate left and moderate to severe right C5 foraminal stenosis. C5-C6: Disc space loss with bulky but mostly anterior and foraminal circumferential disc osteophyte complex. Mild facet hypertrophy. No significant spinal stenosis. Moderate to severe bilateral C6 foraminal stenosis. C6-C7: Disc space loss with circumferential disc  osteophyte complex eccentric to the left. Mild ligament flavum hypertrophy. No significant spinal stenosis. Severe left and moderate to severe right C7 foraminal stenosis. C7-T1: Anterolisthesis with near complete disc space loss. Moderate to severe facet and ligament flavum hypertrophy bilaterally. No significant spinal stenosis, but moderate to severe bilateral C8 foraminal stenosis. No upper thoracic spinal stenosis. IMPRESSION: 1. Suggestion of a venous vascular malformation at the right thoracic inlet (series 6, image 30). Ordinarily clinical significance would be doubtful, but in the setting of right shoulder pain recommend further evaluation.  Chest CT with IV contrast may be the simplest way to evaluate further. Alternative imaging modalities include right brachial plexus MRI (without and with contrast), right shoulder MRI (larger than standard field-of-view, without and with contrast). 2. Advanced cervical spine degeneration with multilevel spondylolisthesis. No acute osseous abnormality identified. 3. Multifactorial spinal stenosis with up to moderate spinal cord mass effect at C3-C4, but no cord signal abnormality. Mild spinal stenosis at C2-C3, C4-C5. 4. Multifactorial moderate and severe cervical neural foraminal stenosis at all levels. Electronically Signed   By: Genevie Ann M.D.   On: 03/05/2018 09:39   Ct Abdomen Pelvis Wo Contrast  Result Date: 03/05/2018 CLINICAL DATA:  Chronic flank pain with splenomegaly. Recent fall and need to rule out intra-abdominal hemorrhage. History of B-cell lymphoma. EXAM: CT ABDOMEN AND PELVIS WITHOUT CONTRAST TECHNIQUE: Multidetector CT imaging of the abdomen and pelvis was performed following the standard protocol without IV contrast. COMPARISON:  None. FINDINGS: Lower chest: Linear and patchy densities in left lower lobe are most compatible with atelectasis. No significant pleural fluid. Evidence for coronary artery calcifications. Hepatobiliary: Cholecystectomy. No  gross abnormality to the liver on this non contrast examination. Pancreas: Unremarkable. No pancreatic ductal dilatation or surrounding inflammatory changes. Spleen: Splenomegaly. Spleen measures greater than 22 cm in the craniocaudal length. Question trace fluid along the caudal aspect of the spleen but no significant perisplenic fluid or hemorrhage. Adrenals/Urinary Tract: Normal adrenal glands. Mild distention of the urinary bladder. Mild fullness of the right renal collecting system without hydronephrosis. No evidence for kidney or ureter stones. Stomach/Bowel: Large amount of stool in the colon, particularly in the rectum. Colonic diverticulosis without acute inflammation. No evidence for bowel obstruction or focal bowel inflammation. Normal appearance of stomach and small bowel. Vascular/Lymphatic: Atherosclerotic calcifications involving the aorta and iliac arteries without aortic aneurysm. No significant lymph node enlargement in the abdomen or pelvis. Reproductive: Prostate is enlarged measuring 6.1 cm in transverse dimension. Other: No significant free fluid in the abdomen or pelvis. No evidence for hemoperitoneum. Minimal stranding and questionable trace fluid along the caudal aspect of spleen. Negative for free air. Musculoskeletal: Laminectomy changes at L4. Chronic disc space loss at L4-L5. Motion artifact on this examination. IMPRESSION: No acute abnormality in the abdomen or pelvis. Splenomegaly without evidence for a significant splenic laceration or hemoperitoneum. There is trace stranding or fluid along the caudal aspect of the spleen but the chronicity is unknown. Study has limitations from motion artifact and lack of intravenous contrast. Prostate enlargement. Electronically Signed   By: Markus Daft M.D.   On: 03/05/2018 14:47   Dg Chest 2 View  Result Date: 03/05/2018 CLINICAL DATA:  Left posterior rib and flank pain for 2-3 weeks. EXAM: CHEST - 2 VIEW COMPARISON:  01/16/2008 FINDINGS:  Normal heart size. Aortic tortuosity. There is no edema, consolidation, effusion, or pneumothorax. Artifact from EKG leads. Spondylosis. No acute osseous finding. IMPRESSION: No evidence of active disease. Electronically Signed   By: Monte Fantasia M.D.   On: 03/05/2018 13:50   Ct Head Wo Contrast  Result Date: 03/05/2018 CLINICAL DATA:  Recent fall with headaches and neck pain, initial encounter EXAM: CT HEAD WITHOUT CONTRAST CT CERVICAL SPINE WITHOUT CONTRAST TECHNIQUE: Multidetector CT imaging of the head and cervical spine was performed following the standard protocol without intravenous contrast. Multiplanar CT image reconstructions of the cervical spine were also generated. COMPARISON:  09/20/2006 CT of the head, 02/19/2018 FINDINGS: CT HEAD FINDINGS Brain: Mild atrophic changes are noted. A small focus of increased  attenuation is noted along the falx re-re- just to the right of the midline. This is best seen on image number 24 of series 4 and on the coronal reconstructions on image number 26 of series 6 and on the sagittal reconstructions image 34 of series 7. No other areas of hemorrhage are identified. No findings to suggest infarction or space-occupying mass lesion are seen. Vascular: No hyperdense vessel or unexpected calcification. Skull: Normal. Negative for fracture or focal lesion. Sinuses/Orbits: Sinuses demonstrates some mild mucosal changes in the ethmoid sinuses as well as mucosal retention cysts in the maxillary antra bilaterally. Other: None CT CERVICAL SPINE FINDINGS Alignment: Alignment is well maintained in the cervical spine. Some anterolisthesis of C7 on T1 of a degenerative nature is noted stable from the previous exam. Skull base and vertebrae: 7 cervical segments are well visualized. Vertebral body height is well maintained. Multilevel disc space narrowing with associated osteophytic change is identified. Facet hypertrophic changes are noted throughout the cervical spine. The  odontoid is within normal limits. No acute fracture or acute facet abnormality is identified. Soft tissues and spinal canal: Chronic calcifications are noted. No other soft tissue abnormality is seen. The vascular changes on recent MRI examination likely represents volume averaging through the junction of the right jugular and right subclavian vein. No definitive vascular mass is noted although contrast was not administered for this exam. Upper chest: There is an aberrant right subclavian artery identified. The remainder of the upper chest is within normal limits. Other: None IMPRESSION: CT of the head: Small focus of increased attenuation along the falx review to the right of the midline as described above. This likely represents a small focus of contusion. No other significant hemorrhage is identified. Mild atrophic changes. Sinus disease as described. CT of the cervical spine: Multilevel degenerative changes similar to that seen on recent MRI from earlier in the same day. No acute fracture is noted. Aberrant right subclavian artery. 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 Electronically Signed   By: Inez Catalina M.D.   On: 03/05/2018 14:46   Ct Cervical Spine Wo Contrast  Result Date: 03/05/2018 CLINICAL DATA:  Recent fall with headaches and neck pain, initial encounter EXAM: CT HEAD WITHOUT CONTRAST CT CERVICAL SPINE WITHOUT CONTRAST TECHNIQUE: Multidetector CT imaging of the head and cervical spine was performed following the standard protocol without intravenous contrast. Multiplanar CT image reconstructions of the cervical spine were also generated. COMPARISON:  09/20/2006 CT of the head, 02/19/2018 FINDINGS: CT HEAD FINDINGS Brain: Mild atrophic changes are noted. A small focus of increased attenuation is noted along the falx re-re- just to the right of the midline. This is best  seen on image number 24 of series 4 and on the coronal reconstructions on image number 26 of series 6 and on the sagittal reconstructions image 34 of series 7. No other areas of hemorrhage are identified. No findings to suggest infarction or space-occupying mass lesion are seen. Vascular: No hyperdense vessel or unexpected calcification. Skull: Normal. Negative for fracture or focal lesion. Sinuses/Orbits: Sinuses demonstrates some mild mucosal changes in the ethmoid sinuses as well as mucosal retention cysts in the maxillary antra bilaterally. Other: None CT CERVICAL SPINE FINDINGS Alignment: Alignment is well maintained in the cervical spine. Some anterolisthesis of C7 on T1 of a degenerative nature is noted stable from the previous exam. Skull base and vertebrae: 7 cervical  segments are well visualized. Vertebral body height is well maintained. Multilevel disc space narrowing with associated osteophytic change is identified. Facet hypertrophic changes are noted throughout the cervical spine. The odontoid is within normal limits. No acute fracture or acute facet abnormality is identified. Soft tissues and spinal canal: Chronic calcifications are noted. No other soft tissue abnormality is seen. The vascular changes on recent MRI examination likely represents volume averaging through the junction of the right jugular and right subclavian vein. No definitive vascular mass is noted although contrast was not administered for this exam. Upper chest: There is an aberrant right subclavian artery identified. The remainder of the upper chest is within normal limits. Other: None IMPRESSION: CT of the head: Small focus of increased attenuation along the falx review to the right of the midline as described above. This likely represents a small focus of contusion. No other significant hemorrhage is identified. Mild atrophic changes. Sinus disease as described. CT of the cervical spine: Multilevel degenerative changes similar to  that seen on recent MRI from earlier in the same day. No acute fracture is noted. Aberrant right subclavian artery. 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 Electronically Signed   By: Inez Catalina M.D.   On: 03/05/2018 14:46   Mr Cervical Spine W/o Contrast  Result Date: 03/05/2018 CLINICAL DATA:  77 year old male with right side neck and shoulder pain for 3 weeks with no known injury. EXAM: MRI CERVICAL SPINE WITHOUT CONTRAST TECHNIQUE: Multiplanar, multisequence MR imaging of the cervical spine was performed. No intravenous contrast was administered. COMPARISON:  Cervical spine radiographs 02/19/2018. FINDINGS: Alignment: Stable from the recent radiographs. Moderate degenerative appearing anterolisthesis of C7 on T1. Mild degenerative anterolisthesis of C4 on C5. Mild superimposed straightening of cervical lordosis. Vertebrae: Visualized bone marrow signal is within normal limits. Intermittent degenerative endplate marrow signal changes, maximal at C6-C7 and C7-T1. No marrow edema or evidence of acute osseous abnormality. Cord: No definite abnormal spinal cord signal despite stenosis with spinal cord mass effect at C3-C4, details below. The visible upper thoracic spinal cord is within normal limits. Posterior Fossa, vertebral arteries, paraspinal tissues: Cervicomedullary junction is within normal limits. Negative visible brain parenchyma. Dominant and mildly dolichoectatic left vertebral artery, the right is diminutive. The major vascular flow voids in the neck are preserved. There is lobular asymmetric and indeterminate appearing signal abnormality at the right thoracic inlet lateral to the right thyroid seen on series 6, image 30. This is not included on the sagittal images. Etiology is unclear, but these seem to be dilated venous vascular structures. No significant regional mass  effect. Disc levels: C2-C3: Mild to moderate facet hypertrophy greater on the right. Mild broad-based posterior disc bulging. Mild ligament flavum hypertrophy. Mild spinal stenosis. No definite spinal cord mass effect. Moderate bilateral C3 foraminal stenosis. C3-C4: Mild left but moderate to severe right side facet hypertrophy with severe ligament flavum hypertrophy. Superimposed circumferential disc bulging and endplate spurring with a broad-based posterior component. Spinal stenosis with mild to moderate spinal cord mass effect. Moderate to severe left and severe right C4 foraminal stenosis. C4-C5: Moderate facet and ligament flavum hypertrophy greater on the right. Superimposed circumferential disc bulge and endplate spurring with a small broad-based central disc protrusion. Mild spinal stenosis with no cord mass effect. Mild to moderate left and moderate to severe right C5 foraminal stenosis. C5-C6: Disc space loss with bulky but mostly  anterior and foraminal circumferential disc osteophyte complex. Mild facet hypertrophy. No significant spinal stenosis. Moderate to severe bilateral C6 foraminal stenosis. C6-C7: Disc space loss with circumferential disc osteophyte complex eccentric to the left. Mild ligament flavum hypertrophy. No significant spinal stenosis. Severe left and moderate to severe right C7 foraminal stenosis. C7-T1: Anterolisthesis with near complete disc space loss. Moderate to severe facet and ligament flavum hypertrophy bilaterally. No significant spinal stenosis, but moderate to severe bilateral C8 foraminal stenosis. No upper thoracic spinal stenosis. IMPRESSION: 1. Suggestion of a venous vascular malformation at the right thoracic inlet (series 6, image 30). Ordinarily clinical significance would be doubtful, but in the setting of right shoulder pain recommend further evaluation. Chest CT with IV contrast may be the simplest way to evaluate further. Alternative imaging modalities include  right brachial plexus MRI (without and with contrast), right shoulder MRI (larger than standard field-of-view, without and with contrast). 2. Advanced cervical spine degeneration with multilevel spondylolisthesis. No acute osseous abnormality identified. 3. Multifactorial spinal stenosis with up to moderate spinal cord mass effect at C3-C4, but no cord signal abnormality. Mild spinal stenosis at C2-C3, C4-C5. 4. Multifactorial moderate and severe cervical neural foraminal stenosis at all levels. Electronically Signed   By: Genevie Ann M.D.   On: 03/05/2018 09:39   Xr Cervical Spine 2 Or 3 Views  Result Date: 02/19/2018 Of the cervical spine obtained in 2 projections.  There is at least a 30% anterior listhesis of C7 on T1.  Joint space is completely obliterated.  There are degenerative changes at C5-6 and C6-7 without listhesis.  Fused facet arthropathy from C4 7     EKG: Independently reviewed.  HR in the 40's , PAC's   Assessment/Plan Principal Problem:   Fall, vasovagal vs due to bradycardia   Patient states he tripped and fell over clothes from his  closet This was a mechanical fall but cannot r/o secondary to bradycardia Patient is a mild contusion on CT head, being brought in for observation hold aspirin and Plavix,start neurochecks We will do repeat CT scan if there is any change in his clinical condition Resume aspirin and Plavix in a week 2-D echo to evaluate EF  Sinus bradycardia with PACs Patient is aware of being bradycardic for several years, his cardiologist in [place] has told him that if he stops metoprolol he will become tachycardic and therefore he continues to take it    HTN (hypertension)-continue lisinopril, metoprolol    Splenic marginal zone b-cell lymphoma ([place]) He is followed by oncology, Dr. Course which, I have notified her and she will schedule him a follow-up . Leukocytosis predominantly lymphocytosis noted    Anemia in neoplastic disease-hemoglobin is  within normal limits today    CAD (coronary artery disease)-history of inferior STEMI in 2015    Hyperlipidemia continue statin    Thrombocytopenia (HCC)-platelet count 161 today        DVT prophylaxis lovenox      consults called:none  Family Communication: Admission, patients condition and plan of care including tests being ordered have been discussed with the patient  who indicates understanding and agree with the plan and Code Status   Admission status: observation      Disposition plan: Further plan will depend as patient's clinical course evolves and further radiologic and laboratory data become available. Likely home when stable    At the time of admission, it appears that the appropriate admission status for this patient is INPATIENT . This is judged to be  reasonable and necessary in order to provide the required intensity of service to ensure the patient's safety given the presenting symptoms, physical exam findings, and initial radiographic and laboratory data in the context of their chronic comorbidities.   Reyne Dumas MD [facility] Pager [phone]  If 7PM-7AM, please contact night-coverage [email or web address] Password [facility]  03/05/2018, 3:54 PM

## 2018-03-05 NOTE — ED Notes (Signed)
Pt ambulated to use urinal stated no dizziness.

## 2018-03-05 NOTE — Progress Notes (Signed)
Pt assessed as per respiratory care protocol.  Bilateral breath sounds clear with pt sats 96%-100% on room air.  Pt with a good strong non-prpductive cough.  No bronchodilators used at home.  Will follow pt with prn txs only.  No indication for scheduled bronchodilators at this time.

## 2018-03-05 NOTE — ED Provider Notes (Signed)
[facility] DEPT Provider Note MRN:  [id number]  Arrival date & time: 03/05/18     Chief Complaint   Fall and Altered Mental Status   History of Present Illness   Marcus Taylor is a 77 y.o. year-old male with a history of lymphoma/leukemia, as well as CAD on aspirin and Plavix presenting to the ED with chief complaint of ground-level fall and head trauma.  Patient explains that shortly prior to arrival he was attempting to move something while walking through his closet, tripped on something, going backwards and striking the back of his head.  Patient denies any symptoms of chest pain or dizziness prior to the fall, explaining that it was purely mechanical fall.  Patient denies loss of consciousness, but states that some events prior to the fall he does not remember.  Patient also endorsing neck pain that is mild, chronic, and unchanged since the fall.  Denies any nausea or vomiting since the fall, no numbness or weakness in the arms or legs.  Patient also endorsing 2 to 3 weeks of left flank pain, moderate in severity, worse when laying on his left side during the night.  Has not been evaluated by a physician for this issue.  She denies chest pain, shortness of breath, abdominal pain, dysuria.  Has a history of kidney stones, but this does not feel like a typical kidney stone.  Review of Systems  Positive for head trauma, ground-level fall, flank pain.  All other systems reviewed and negative.  Patient's Health History    Past Medical History:  Diagnosis Date  . Anemia   . Arthritis   . Colonic polyp   . Coronary artery disease    stents x 3 (03-2014)  . Diabetes mellitus   . Diverticulosis    throughout entire colon  . HTN (hypertension)   . Hypertriglyceridemia   . Kidney stone   . Lymphocytosis   . Metatarsal fracture left   left 5th proximal phalanx   . OSA (obstructive sleep apnea)    "Mild" by records  . PONV (postoperative nausea and  vomiting)   . RBBB 01/2016  . Splenic marginal zone b-cell lymphoma ([place]) 12/18/2011  . STEMI (ST elevation myocardial infarction) [facility])    August 2015    Past Surgical History:  Procedure Laterality Date  . CARDIAC CATHETERIZATION     stents x 3 (03/2014)  . CHOLECYSTECTOMY    . HERNIA REPAIR    . INGUINAL HERNIA REPAIR Right 05/27/2015   Procedure: LAPAROSCOPIC REPAIR RIGHT  INGUINAL HERNIA;  Surgeon: Greer Pickerel, MD;  Location: [facility] OR[facility];  Service: General;  Laterality: Right;  . INSERTION OF MESH Right 05/27/2015   Procedure: INSERTION OF MESH;  Surgeon: Greer Pickerel, MD;  Location: [facility];  Service: General;  Laterality: Right;  . LUMBAR DISC SURGERY    . TONSILLECTOMY AND ADENOIDECTOMY      Family History  Problem Relation Age of Onset  . Kidney disease Sister   . Colon cancer Sister 57  . Colon cancer Brother   . Arthritis Mother   . Breast cancer Mother   . Heart disease Mother   . Bladder Cancer Brother   . Arthritis Son   . Esophageal cancer Sister   . Rectal cancer Neg Hx   . Stomach cancer Neg Hx     Social History   Socioeconomic History  . Marital status: Married    Spouse name: Not on file  . Number of children: 2  .  Years of education: Not on file  . 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 Financial resource strain: Not on file  . Food insecurity:    Worry: Not on file    Inability: Not on file  . Transportation needs:    Medical: Not on file    Non-medical: Not on file  Tobacco Use  . Smoking status: Former Smoker    Packs/day: 1.00    Years: 20.00    Pack years: 20.00    Last attempt to quit: 08/14/1988    Years since quitting: 29.5  . Smokeless tobacco: Never Used  Substance and Sexual Activity  . Alcohol use: No  . Drug use: No  . Sexual activity: Yes    Birth control/protection: None  Lifestyle  . Physical activity:    Days per week: Not on file      Minutes per session: Not on file  . Stress: Not on file  Relationships  . Social connections:    Talks on phone: Not on file    Gets together: Not on file    Attends religious service: Not on file    Active member of club or organization: Not on file    Attends meetings of clubs or organizations: Not on file    Relationship status: Not on file  . Intimate partner violence:    Fear of current or ex partner: Not on file    Emotionally abused: Not on file    Physically abused: Not on file    Forced sexual activity: Not on file  Other Topics Concern  . Not on file  Social History Narrative   Married to [place].    HS education. Retired.    Former smoker. No drugs or ETOH.   Wears seatbelt.   Wears dentures.   Smoke detector in the home. Firearms  In the home.    Feels safe in his relationships.      Physical Exam  Vital Signs and Nursing Notes reviewed Vitals:   03/05/18 1500 03/05/18 1515  BP: 116/79 130/69  Pulse: (!) 50   Resp: 20   Temp:    SpO2: 100% 98%    CONSTITUTIONAL: Well-appearing, NAD NEURO:  Alert and oriented x 3, no focal deficits EYES:  eyes equal and reactive ENT/NECK:  no LAD, no JVD CARDIO: Regular rate, well-perfused, normal S1 and S2 PULM:  CTAB no wheezing or rhonchi GI/GU:  normal bowel sounds, non-distended, non-tender; mild to moderate tenderness to palpation to the left flank above the ribs MSK/SPINE:  No gross deformities, no edema SKIN:  no rash, atraumatic, no significant bruising to the back of the head, mild tenderness to palpation PSYCH:  Appropriate speech and behavior  Diagnostic and Interventional Summary    EKG Interpretation  Date/Time:    Ventricular Rate:    PR Interval:    QRS Duration:   QT Interval:    QTC Calculation:   R Axis:     Text Interpretation:        Labs Reviewed  CBC - Abnormal; Notable for the following components:      Result Value   WBC 18.0 (*)    RDW 16.7 (*)    All other components within  normal limits  COMPREHENSIVE METABOLIC PANEL - Abnormal; Notable for the following components:   Total Protein 5.9 (*)    All other components within  normal limits  URINALYSIS, ROUTINE W REFLEX MICROSCOPIC - Abnormal; Notable for the following components:   Protein, ur 30 (*)    Bacteria, UA RARE (*)    All other components within normal limits    CT ABDOMEN PELVIS WO CONTRAST  Final Result    CT Head Wo Contrast  Final Result    CT Cervical Spine Wo Contrast  Final Result    DG Chest 2 View  Final Result      Medications  acetaminophen (TYLENOL) tablet 650 mg (650 mg Oral Given 03/05/18 1444)     Procedures EMERGENCY DEPARTMENT ULTRASOUND  Study: Limited Retroperitoneal Ultrasound of the Abdominal Aorta.  INDICATIONS:Age>55 and Flank pain Multiple views of the abdominal aorta were obtained in real-time from the diaphragmatic hiatus to the aortic bifurcation in transverse planes with a multi-frequency probe.  PERFORMED BY: Myself IMAGES ARCHIVED?: Yes LIMITATIONS:  Body habitus and Bowel gas INTERPRETATION:  No abdominal aortic aneurysm and Normal caliber aorta.  EMERGENCY DEPARTMENT US RENAL EXAM  "Study: Limited Retroperitoneal Ultrasound of Kidneys"  INDICATIONS: Flank pain Long and short axis of both kidneys were obtained.   PERFORMED BY: Myself IMAGES ARCHIVED?: Yes LIMITATIONS: None VIEWS USED: Long axis and Short axis  INTERPRETATION: No Hydronephrosis, But significant splenomegaly that appears directly related to patient's tenderness.  Normal texture of the spleen with no obvious bleeding sites.  Critical Care: N/A  ED Course and Medical Decision Making  I have reviewed the triage vital signs and the nursing notes.  Pertinent labs & imaging results that were available during my care of the patient were reviewed by me and considered in my medical decision making (see below for details). Clinical Course as of Mar 06 1551  Tue Mar 05, 5753  [id number]  77 year old male with a history of CAD on aspirin and Plavix presenting with ground level fall, head trauma, retrograde amnesia, but denies loss of consciousness.  Patient explains that the fall was mechanical, no chest pain or dizziness prior to the fall, states that he tripped on something.  Denies nausea or vomiting since the fall but endorsing a dull frontal headache.  Patient with history of chronic neck pain, underwent MRI this morning, states that neck pain is at his baseline today, good range of motion on exam.  However given the nature of the fall, will obtain CT of C-spine while obtaining CT head to rule out intracranial bleeding.  Patient also endorsing left rib and/or flank pain for the past 2 to 3 weeks, has not been seen by a doctor as of yet.  The area is tender to palpation at the ribs, most consistent with muscular skeletal pain.  However will obtain screening labs, chest x-ray, bedside ultrasound.   [MB]  1332 Bedside ultrasound reveals normal caliber aorta, nothing to suggest aortic pathology.  During evaluation of the left kidney, which was unremarkable, ultrasound revealed extreme splenomegaly.  Patient explains that he has a history of splenomegaly due to his leukemia.  Given the significant severity of this patient's flank pain and the progressively worsening nature for the past 2 to 3 weeks, as well as the trauma today with anticoagulation on board, will pursue CT non-Contrast imaging of the abdomen pelvis today to rule out traumatic bleeding as well as evaluate for possible malignancy.   [MB]  8101 Patient is currently with a completely normal neurological exam, alert and oriented x3.   [MB]  7510 CT head reveals no intracranial bleeding, however small area of likely contusion.  CT abdomen reveals no splenic injury, however small fluid collection of undetermined significance or acuity.  Given these findings and the increased risk of delayed intracranial bleeding while on Plavix, will  request for admission.   [MB]    Clinical Course User Index [MB] Maudie Flakes, MD    Admitted to hospital service in stable condition.  Barth Kirks. Sedonia Small, [email or web address] [email or web address]  Final Clinical Impressions(s) / ED Diagnoses     ICD-10-CM   1. Injury of head, initial encounter S09.90XA   2. Rib pain R07.81 DG Chest 2 View    DG Chest 2 View  3. Flank pain R10.9   4. Fall from ground level W18.30XA   5. Neck pain M54.2   6. Splenomegaly R16.1   7. Amnesia R41.3     ED Discharge Orders    None         Maudie Flakes, MD 03/05/18 1553

## 2018-03-05 NOTE — ED Triage Notes (Signed)
Pt in closet, had a mechanical fall between 30 and 45 minutes PTA; remembers events before and during; no loc, no neck pain, no back pain; doesn't remember events directly after the fall; pt c/o HA over L eye; pt states he did hit the back of his head; EKG rhythm 40-80 sinus; extra beats noted by EMS, which pt says is normal for him; pt on Plavix for MI hx; HX htn, hyperlipidemia  BP 154/78 HR 40-88 98% RA 118 CBG 16 RR

## 2018-03-05 NOTE — ED Notes (Signed)
Report attempted x2

## 2018-03-05 NOTE — ED Notes (Addendum)
Patient transported to X-Ray 

## 2018-03-05 NOTE — ED Notes (Signed)
Report attempted x 1

## 2018-03-06 ENCOUNTER — Observation Stay (HOSPITAL_BASED_OUTPATIENT_CLINIC_OR_DEPARTMENT_OTHER): Payer: Medicare Other

## 2018-03-06 ENCOUNTER — Encounter (HOSPITAL_COMMUNITY): Payer: Self-pay

## 2018-03-06 ENCOUNTER — Observation Stay (HOSPITAL_COMMUNITY): Payer: Medicare Other

## 2018-03-06 ENCOUNTER — Ambulatory Visit: Payer: Medicare Other | Admitting: Family Medicine

## 2018-03-06 DIAGNOSIS — R55 Syncope and collapse: Secondary | ICD-10-CM

## 2018-03-06 DIAGNOSIS — I2510 Atherosclerotic heart disease of native coronary artery without angina pectoris: Secondary | ICD-10-CM | POA: Diagnosis not present

## 2018-03-06 DIAGNOSIS — R51 Headache: Secondary | ICD-10-CM | POA: Diagnosis not present

## 2018-03-06 DIAGNOSIS — I351 Nonrheumatic aortic (valve) insufficiency: Secondary | ICD-10-CM | POA: Diagnosis not present

## 2018-03-06 DIAGNOSIS — D630 Anemia in neoplastic disease: Secondary | ICD-10-CM

## 2018-03-06 DIAGNOSIS — W1830XA Fall on same level, unspecified, initial encounter: Secondary | ICD-10-CM | POA: Diagnosis not present

## 2018-03-06 DIAGNOSIS — S0093XA Contusion of unspecified part of head, initial encounter: Secondary | ICD-10-CM | POA: Diagnosis not present

## 2018-03-06 DIAGNOSIS — S062X0A Diffuse traumatic brain injury without loss of consciousness, initial encounter: Secondary | ICD-10-CM | POA: Diagnosis not present

## 2018-03-06 DIAGNOSIS — D649 Anemia, unspecified: Secondary | ICD-10-CM | POA: Diagnosis not present

## 2018-03-06 LAB — COMPREHENSIVE METABOLIC PANEL
ALBUMIN: 3.5 g/dL (ref 3.5–5.0)
ALK PHOS: 62 U/L (ref 38–126)
ALT: 21 U/L (ref 0–44)
AST: 31 U/L (ref 15–41)
Anion gap: 7 (ref 5–15)
BILIRUBIN TOTAL: 0.6 mg/dL (ref 0.3–1.2)
BUN: 17 mg/dL (ref 8–23)
CALCIUM: 8.5 mg/dL — AB (ref 8.9–10.3)
CO2: 26 mmol/L (ref 22–32)
Chloride: 109 mmol/L (ref 98–111)
Creatinine, Ser: 0.98 mg/dL (ref 0.61–1.24)
GFR calc Af Amer: >60 mL/min (ref >60)
Glucose, Bld: 108 mg/dL — ABNORMAL HIGH (ref 70–99)
Potassium: 4 mmol/L (ref 3.5–5.1)
Sodium: 142 mmol/L (ref 135–145)
TOTAL PROTEIN: 5.4 g/dL — AB (ref 6.5–8.1)

## 2018-03-06 LAB — CBC WITH DIFFERENTIAL/PLATELET
BAND NEUTROPHILS: 0 %
BASOS ABS: 0 10*3/uL (ref 0.0–0.1)
Basophils Relative: 0 %
Blasts: 0 %
EOS ABS: 0 10*3/uL (ref 0.0–0.7)
EOS PCT: 0 %
HEMATOCRIT: 37.1 % — AB (ref 39.0–52.0)
Hemoglobin: 11.8 g/dL — ABNORMAL LOW (ref 13.0–17.0)
LYMPHS ABS: 15.6 10*3/uL — AB (ref 0.7–4.0)
Lymphocytes Relative: 90 %
MCH: 28 pg (ref 26.0–34.0)
MCHC: 31.8 g/dL (ref 30.0–36.0)
MCV: 87.9 fL (ref 78.0–100.0)
Metamyelocytes Relative: 0 %
Monocytes Absolute: 0.5 10*3/uL (ref 0.1–1.0)
Monocytes Relative: 3 %
Myelocytes: 0 %
Neutro Abs: 1.2 10*3/uL — ABNORMAL LOW (ref 1.7–7.7)
Neutrophils Relative %: 7 %
Other: 0 %
Platelets: 159 10*3/uL (ref 150–400)
Promyelocytes Relative: 0 %
RBC: 4.22 MIL/uL (ref 4.22–5.81)
RDW: 16.7 % — ABNORMAL HIGH (ref 11.5–15.5)
Smear Review: ADEQUATE
WBC: 17.3 10*3/uL — ABNORMAL HIGH (ref 4.0–10.5)
nRBC: 0 /100 WBC

## 2018-03-06 LAB — GLUCOSE, CAPILLARY
GLUCOSE-CAPILLARY: 69 mg/dL — AB (ref 70–99)
GLUCOSE-CAPILLARY: 86 mg/dL (ref 70–99)
Glucose-Capillary: 179 mg/dL — ABNORMAL HIGH (ref 70–99)

## 2018-03-06 LAB — ECHOCARDIOGRAM COMPLETE
Height: 72 in
WEIGHTICAEL: 3255.75 [oz_av]

## 2018-03-06 LAB — PATHOLOGIST SMEAR REVIEW

## 2018-03-06 LAB — TSH: TSH: 1.567 u[IU]/mL (ref 0.350–4.500)

## 2018-03-06 NOTE — Discharge Summary (Signed)
Physician Discharge Summary  Marcus Taylor NWG:[id number] DOB: August 22, 1940 DOA: 03/05/2018  PCP: Howard Pouch A, DO  Admit date: 03/05/2018 Discharge date: 03/06/2018  Admitted From: home Disposition:  Home   Recommendations for Outpatient Follow-up:  1. Follow up with PCP in 1-2 weeks 2. Hold aspirin and plavix for a week  Home Health: none Equipment/Devices: none  Discharge Condition: stable CODE STATUS: Full code Diet recommendation: heart healthy  HPI: Per Dr. Allyson Sabal 77 year old male with a history of coronary artery disease, PACs, chronic bradycardia with heart rate in the 40s and 50s, followed by [name], MD ,history of splenic/marginal zone lymphoma followed by Dr. [name], MD, who presented to the ED today after a fall and hitting his head and bouncing his head off the floor when he fell, without any loss of consciousness. Patient states that he was trying to clean out his closet this morning for some work being done at his house. Earlier today he also had an MRI of the C-spine at [facility] radiology because of bilateral shoulder pain that he has been experiencing.he denies any chest pain, palpitations. He has a history of known bradycardia with resting heart rate in the 40s to 60s. He is followed by cardiologist in [place]. Recently also been having left flank pain and was noted to have splenomegaly on a bedside ultrasound today and on the CT scan in the ED Patient complains of a headache but no blurry vision or unilateral weakness or slurred speech He is conversive alert and oriented. He was noted to have some retrograde amnesia after the fall by his family who brought him in today.  Hospital Course: Fall -patient was admitted to the hospital with a fall which sounds to be mechanical.  He states that he tripped and fell over some items on the floor.  He denies any syncope or passing out, denies any chest pain, denies any palpitations.  He was admitted  to the hospital due to history of bradycardia as well as mild contusion seen on the CT head.  He remained clinically and neurologically stable, he underwent a repeat CT scan the next morning which showed regressed but not fully resolved trace posttraumatic subarachnoid hemorrhage within the right cingulate sulcus.  He was asymptomatic, ambulating well in the hallway without significant lightheadedness or dizziness, and was discharged home in stable condition and to hold his dual antiplatelet therapy for 7 days. Sinus bradycardia with PACs -this is not new, he is aware of being medicated for several years, telemetry was without significant findings.  He is on metoprolol, he discussed this extensively with his cardiologist and he decided to continue metoprolol as he stops it he gets tachycardic.EKG looks unchanged compared to 2017 Splenic/marginal zone lymphoma -followed as an outpatient by Dr. Alvy Bimler.  Patient tells me that he has been having worsening left sided pain, at times has difficulty sleeping at night.  CT scan on admission showed splenomegaly without evidence for significant spinal laceration or hemoperitoneum, there is trace stranding or fluid around the caudal aspect of the spleen but unknown if it is acute on chronic.  Recommended continue follow-up with Dr. Alvy Bimler Hypertension -continue home medications Coronary artery disease -no chest pain, stents placed 3-4 years ago, primary cardiologist is at [facility] Hyperlipidemia -continue statin  Discharge Diagnoses:  Principal Problem:   Syncope, vasovagal Active Problems:   HTN (hypertension)   Splenic marginal zone b-cell lymphoma (HCC)   Anemia in neoplastic disease   CAD (coronary artery disease)  Hyperlipidemia   Thrombocytopenia (HCC)   IDA (iron deficiency anemia)   Syncope and collapse     Discharge Instructions   Allergies as of 03/06/2018   No Known Allergies     Medication List    STOP taking these medications     aspirin EC 81 MG tablet   clopidogrel 75 MG tablet Commonly known as:  PLAVIX     TAKE these medications   atorvastatin 40 MG tablet Commonly known as:  LIPITOR Take 1 tablet (40 mg total) by mouth daily.   ferrous sulfate 325 (65 FE) MG tablet Take 325 mg by mouth daily with breakfast.   lisinopril 20 MG tablet Commonly known as:  PRINIVIL,ZESTRIL Take 1 tablet (20 mg total) by mouth daily.   metFORMIN 500 MG 24 hr tablet Commonly known as:  GLUCOPHAGE-XR Take 1 tablet (500 mg total) by mouth daily with breakfast. TAKE 1 TABLET BY MOUTH WITH EVENING MEAL M-W-F What changed:    when to take this  additional instructions   metoprolol tartrate 25 MG tablet Commonly known as:  LOPRESSOR Take 0.5 tablets (12.5 mg total) by mouth 2 (two) times daily.   nitroGLYCERIN 0.4 MG SL tablet Commonly known as:  NITROSTAT Place 1 tablet (0.4 mg total) under the tongue every 5 (five) minutes as needed for chest pain.      Follow-up Information    Kuneff, Renee A, DO. Schedule an appointment as soon as possible for a visit in 2 week(s).   Specialty:  Family Medicine Contact information: [address] [place] [address] [phone]           Consultations:  None   Procedures/Studies:  2D echo  Study Conclusions - Left ventricle: The cavity size was normal. Wall thickness was increased in a pattern of mild LVH. There was mild concentric hypertrophy. Systolic function was normal. The estimated ejection fraction was in the range of 60% to 65%. Wall motion was normal; there were no regional wall motion abnormalities. The study is not technically sufficient to allow evaluation of LV diastolic function. - Aortic valve: There was mild regurgitation. - Left atrium: The atrium was mildly dilated. - Atrial septum: No defect or patent foramen ovale was identified. - Impressions: Patient appeared to have 3:2 wenkebach during exam.  Ct Abdomen Pelvis Wo Contrast  Result Date:  03/05/2018 CLINICAL DATA:  Chronic flank pain with splenomegaly. Recent fall and need to rule out intra-abdominal hemorrhage. History of B-cell lymphoma. EXAM: CT ABDOMEN AND PELVIS WITHOUT CONTRAST TECHNIQUE: Multidetector CT imaging of the abdomen and pelvis was performed following the standard protocol without IV contrast. COMPARISON:  None. FINDINGS: Lower chest: Linear and patchy densities in left lower lobe are most compatible with atelectasis. No significant pleural fluid. Evidence for coronary artery calcifications. Hepatobiliary: Cholecystectomy. No gross abnormality to the liver on this non contrast examination. Pancreas: Unremarkable. No pancreatic ductal dilatation or surrounding inflammatory changes. Spleen: Splenomegaly. Spleen measures greater than 22 cm in the craniocaudal length. Question trace fluid along the caudal aspect of the spleen but no significant perisplenic fluid or hemorrhage. Adrenals/Urinary Tract: Normal adrenal glands. Mild distention of the urinary bladder. Mild fullness of the right renal collecting system without hydronephrosis. No evidence for kidney or ureter stones. Stomach/Bowel: Large amount of stool in the colon, particularly in the rectum. Colonic diverticulosis without acute inflammation. No evidence for bowel obstruction or focal bowel inflammation. Normal appearance of stomach and small bowel. Vascular/Lymphatic: Atherosclerotic calcifications involving the aorta and iliac arteries  without aortic aneurysm. No significant lymph node enlargement in the abdomen or pelvis. Reproductive: Prostate is enlarged measuring 6.1 cm in transverse dimension. Other: No significant free fluid in the abdomen or pelvis. No evidence for hemoperitoneum. Minimal stranding and questionable trace fluid along the caudal aspect of spleen. Negative for free air. Musculoskeletal: Laminectomy changes at L4. Chronic disc space loss at L4-L5. Motion artifact on this examination. IMPRESSION: No acute  abnormality in the abdomen or pelvis. Splenomegaly without evidence for a significant splenic laceration or hemoperitoneum. There is trace stranding or fluid along the caudal aspect of the spleen but the chronicity is unknown. Study has limitations from motion artifact and lack of intravenous contrast. Prostate enlargement. Electronically Signed   By: Markus Daft M.D.   On: 03/05/2018 14:47   Dg Chest 2 View  Result Date: 03/05/2018 CLINICAL DATA:  Left posterior rib and flank pain for 2-3 weeks. EXAM: CHEST - 2 VIEW COMPARISON:  01/16/2008 FINDINGS: Normal heart size. Aortic tortuosity. There is no edema, consolidation, effusion, or pneumothorax. Artifact from EKG leads. Spondylosis. No acute osseous finding. IMPRESSION: No evidence of active disease. Electronically Signed   By: Monte Fantasia M.D.   On: 03/05/2018 13:50   Ct Head Wo Contrast  Result Date: 03/06/2018 CLINICAL DATA:  77 year old male status post fall 2 days ago, struck back of head. Headache and bruising. Suggestion of trace subarachnoid hemorrhage on head CT yesterday. EXAM: CT HEAD WITHOUT CONTRAST TECHNIQUE: Contiguous axial images were obtained from the base of the skull through the vertex without intravenous contrast. COMPARISON:  Head and cervical spine CT 03/05/2018. Cervical spine MRI 03/05/2018. FINDINGS: Brain: Regressed but not fully resolved trace subarachnoid hemorrhage along the right cingulate sulcus, coronal image 28 today. No new or increased intracranial hemorrhage. No intraventricular hemorrhage. No midline shift, ventriculomegaly, mass effect, evidence of mass lesion, or evidence of cortically based acute infarction. Gray-white matter differentiation is within normal limits throughout the brain. No cortical encephalomalacia identified. Vascular: Calcified atherosclerosis at the skull base. No suspicious intracranial vascular hyperdensity. Skull: Stable and intact. Sinuses/Orbits: Stable. No sinus fluid levels. Tympanic  cavities and mastoids are stable and well pneumatized. Other: No acute orbit or scalp soft tissue finding. Partially visible left face subcutaneous nodule measuring 16 mm is probably a benign sebaceous cyst (series 4, image 1). IMPRESSION: 1. Regressed but not fully resolved trace posttraumatic subarachnoid hemorrhage within the right cingulate sulcus. 2. No new intracranial abnormality. Electronically Signed   By: Genevie Ann M.D.   On: 03/06/2018 10:43   Ct Head Wo Contrast  Result Date: 03/05/2018 CLINICAL DATA:  Recent fall with headaches and neck pain, initial encounter EXAM: CT HEAD WITHOUT CONTRAST CT CERVICAL SPINE WITHOUT CONTRAST TECHNIQUE: Multidetector CT imaging of the head and cervical spine was performed following the standard protocol without intravenous contrast. Multiplanar CT image reconstructions of the cervical spine were also generated. COMPARISON:  09/20/2006 CT of the head, 02/19/2018 FINDINGS: CT HEAD FINDINGS Brain: Mild atrophic changes are noted. A small focus of increased attenuation is noted along the falx re-re- just to the right of the midline. This is best seen on image number 24 of series 4 and on the coronal reconstructions on image number 26 of series 6 and on the sagittal reconstructions image 34 of series 7. No other areas of hemorrhage are identified. No findings to suggest infarction or space-occupying mass lesion are seen. Vascular: No hyperdense vessel or unexpected calcification. Skull: Normal. Negative for fracture or focal lesion. Sinuses/Orbits: Sinuses  demonstrates some mild mucosal changes in the ethmoid sinuses as well as mucosal retention cysts in the maxillary antra bilaterally. Other: None CT CERVICAL SPINE FINDINGS Alignment: Alignment is well maintained in the cervical spine. Some anterolisthesis of C7 on T1 of a degenerative nature is noted stable from the previous exam. Skull base and vertebrae: 7 cervical segments are well visualized. Vertebral body height is  well maintained. Multilevel disc space narrowing with associated osteophytic change is identified. Facet hypertrophic changes are noted throughout the cervical spine. The odontoid is within normal limits. No acute fracture or acute facet abnormality is identified. Soft tissues and spinal canal: Chronic calcifications are noted. No other soft tissue abnormality is seen. The vascular changes on recent MRI examination likely represents volume averaging through the junction of the right jugular and right subclavian vein. No definitive vascular mass is noted although contrast was not administered for this exam. Upper chest: There is an aberrant right subclavian artery identified. The remainder of the upper chest is within normal limits. Other: None IMPRESSION: CT of the head: Small focus of increased attenuation along the falx review to the right of the midline as described above. This likely represents a small focus of contusion. No other significant hemorrhage is identified. Mild atrophic changes. Sinus disease as described. CT of the cervical spine: Multilevel degenerative changes similar to that seen on recent MRI from earlier in the same day. No acute fracture is noted. Aberrant right subclavian artery. 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 Electronically Signed   By: Inez Catalina M.D.   On: 03/05/2018 14:46   Ct Cervical Spine Wo Contrast  Result Date: 03/05/2018 CLINICAL DATA:  Recent fall with headaches and neck pain, initial encounter EXAM: CT HEAD WITHOUT CONTRAST CT CERVICAL SPINE WITHOUT CONTRAST TECHNIQUE: Multidetector CT imaging of the head and cervical spine was performed following the standard protocol without intravenous contrast. Multiplanar CT image reconstructions of the cervical spine were also generated. COMPARISON:  09/20/2006 CT of the head, 02/19/2018 FINDINGS: CT  HEAD FINDINGS Brain: Mild atrophic changes are noted. A small focus of increased attenuation is noted along the falx re-re- just to the right of the midline. This is best seen on image number 24 of series 4 and on the coronal reconstructions on image number 26 of series 6 and on the sagittal reconstructions image 34 of series 7. No other areas of hemorrhage are identified. No findings to suggest infarction or space-occupying mass lesion are seen. Vascular: No hyperdense vessel or unexpected calcification. Skull: Normal. Negative for fracture or focal lesion. Sinuses/Orbits: Sinuses demonstrates some mild mucosal changes in the ethmoid sinuses as well as mucosal retention cysts in the maxillary antra bilaterally. Other: None CT CERVICAL SPINE FINDINGS Alignment: Alignment is well maintained in the cervical spine. Some anterolisthesis of C7 on T1 of a degenerative nature is noted stable from the previous exam. Skull base and vertebrae: 7 cervical segments are well visualized. Vertebral body height is well maintained. Multilevel disc space narrowing with associated osteophytic change is identified. Facet hypertrophic changes are noted throughout the cervical spine. The odontoid is within normal limits. No acute fracture or acute facet abnormality is identified. Soft tissues and spinal canal: Chronic calcifications are noted. No other soft tissue abnormality is seen. The vascular changes on recent MRI examination likely represents volume averaging through the junction of the right jugular and right subclavian vein.  No definitive vascular mass is noted although contrast was not administered for this exam. Upper chest: There is an aberrant right subclavian artery identified. The remainder of the upper chest is within normal limits. Other: None IMPRESSION: CT of the head: Small focus of increased attenuation along the falx review to the right of the midline as described above. This likely represents a small focus of  contusion. No other significant hemorrhage is identified. Mild atrophic changes. Sinus disease as described. CT of the cervical spine: Multilevel degenerative changes similar to that seen on recent MRI from earlier in the same day. No acute fracture is noted. Aberrant right subclavian artery. 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 Electronically Signed   By: Inez Catalina M.D.   On: 03/05/2018 14:46   Mr Cervical Spine W/o Contrast  Result Date: 03/05/2018 CLINICAL DATA:  77 year old male with right side neck and shoulder pain for 3 weeks with no known injury. EXAM: MRI CERVICAL SPINE WITHOUT CONTRAST TECHNIQUE: Multiplanar, multisequence MR imaging of the cervical spine was performed. No intravenous contrast was administered. COMPARISON:  Cervical spine radiographs 02/19/2018. FINDINGS: Alignment: Stable from the recent radiographs. Moderate degenerative appearing anterolisthesis of C7 on T1. Mild degenerative anterolisthesis of C4 on C5. Mild superimposed straightening of cervical lordosis. Vertebrae: Visualized bone marrow signal is within normal limits. Intermittent degenerative endplate marrow signal changes, maximal at C6-C7 and C7-T1. No marrow edema or evidence of acute osseous abnormality. Cord: No definite abnormal spinal cord signal despite stenosis with spinal cord mass effect at C3-C4, details below. The visible upper thoracic spinal cord is within normal limits. Posterior Fossa, vertebral arteries, paraspinal tissues: Cervicomedullary junction is within normal limits. Negative visible brain parenchyma. Dominant and mildly dolichoectatic left vertebral artery, the right is diminutive. The major vascular flow voids in the neck are preserved. There is lobular asymmetric and indeterminate appearing signal abnormality at the right thoracic inlet lateral to the right thyroid seen  on series 6, image 30. This is not included on the sagittal images. Etiology is unclear, but these seem to be dilated venous vascular structures. No significant regional mass effect. Disc levels: C2-C3: Mild to moderate facet hypertrophy greater on the right. Mild broad-based posterior disc bulging. Mild ligament flavum hypertrophy. Mild spinal stenosis. No definite spinal cord mass effect. Moderate bilateral C3 foraminal stenosis. C3-C4: Mild left but moderate to severe right side facet hypertrophy with severe ligament flavum hypertrophy. Superimposed circumferential disc bulging and endplate spurring with a broad-based posterior component. Spinal stenosis with mild to moderate spinal cord mass effect. Moderate to severe left and severe right C4 foraminal stenosis. C4-C5: Moderate facet and ligament flavum hypertrophy greater on the right. Superimposed circumferential disc bulge and endplate spurring with a small broad-based central disc protrusion. Mild spinal stenosis with no cord mass effect. Mild to moderate left and moderate to severe right C5 foraminal stenosis. C5-C6: Disc space loss with bulky but mostly anterior and foraminal circumferential disc osteophyte complex. Mild facet hypertrophy. No significant spinal stenosis. Moderate to severe bilateral C6 foraminal stenosis. C6-C7: Disc space loss with circumferential disc osteophyte complex eccentric to the left. Mild ligament flavum hypertrophy. No significant spinal stenosis. Severe left and moderate to severe right C7 foraminal stenosis. C7-T1: Anterolisthesis with near complete disc space loss. Moderate to severe facet and ligament flavum hypertrophy bilaterally. No significant spinal stenosis, but moderate to severe bilateral C8 foraminal stenosis. No  upper thoracic spinal stenosis. IMPRESSION: 1. Suggestion of a venous vascular malformation at the right thoracic inlet (series 6, image 30). Ordinarily clinical significance would be doubtful, but in the  setting of right shoulder pain recommend further evaluation. Chest CT with IV contrast may be the simplest way to evaluate further. Alternative imaging modalities include right brachial plexus MRI (without and with contrast), right shoulder MRI (larger than standard field-of-view, without and with contrast). 2. Advanced cervical spine degeneration with multilevel spondylolisthesis. No acute osseous abnormality identified. 3. Multifactorial spinal stenosis with up to moderate spinal cord mass effect at C3-C4, but no cord signal abnormality. Mild spinal stenosis at C2-C3, C4-C5. 4. Multifactorial moderate and severe cervical neural foraminal stenosis at all levels. Electronically Signed   By: Genevie Ann M.D.   On: 03/05/2018 09:39   Xr Cervical Spine 2 Or 3 Views  Result Date: 02/19/2018 Of the cervical spine obtained in 2 projections.  There is at least a 30% anterior listhesis of C7 on T1.  Joint space is completely obliterated.  There are degenerative changes at C5-6 and C6-7 without listhesis.  Fused facet arthropathy from C4 7     Subjective: - no chest pain, shortness of breath, no abdominal pain, nausea or vomiting.   Discharge Exam: Vitals:   03/06/18 0840 03/06/18 1218  BP: 137/64 (!) 150/73  Pulse: (!) 52 (!) 44  Resp: 20 20  Temp: 98.2 F (36.8 C) 97.8 F (36.6 C)  SpO2: 98% 96%    General: Pt is alert, awake, not in acute distress Cardiovascular: RRR, S1/S2 +, no rubs, no gallops Respiratory: CTA bilaterally, no wheezing, no rhonchi Abdominal: Soft, NT, ND, bowel sounds + Extremities: no edema, no cyanosis    The results of significant diagnostics from this hospitalization (including imaging, microbiology, ancillary and laboratory) are listed below for reference.     Microbiology: No results found for this or any previous visit (from the past 240 hour(s)).   Labs: BNP (last 3 results) No results for input(s): BNP in the last 8760 hours. Basic Metabolic Panel: Recent  Labs  Lab 03/05/18 1313 03/05/18 2306 03/06/18 0357  NA 141  --  142  K 4.2  --  4.0  CL 108  --  109  CO2 25  --  26  GLUCOSE 96  --  108*  BUN 17  --  17  CREATININE 0.81 1.03 0.98  CALCIUM 9.0  --  8.5*  MG  --  2.1  --    Liver Function Tests: Recent Labs  Lab 03/05/18 1313 03/06/18 0357  AST 38 31  ALT 24 21  ALKPHOS 73 62  BILITOT 0.6 0.6  PROT 5.9* 5.4*  ALBUMIN 4.0 3.5   No results for input(s): LIPASE, AMYLASE in the last 168 hours. No results for input(s): AMMONIA in the last 168 hours. CBC: Recent Labs  Lab 03/05/18 1313 03/05/18 2306 03/06/18 0357  WBC 18.0* 17.0* 17.3*  NEUTROABS  --   --  1.2*  HGB 13.5 12.6* 11.8*  HCT 42.9 40.0 37.1*  MCV 88.3 88.7 87.9  PLT 161 150 159   Cardiac Enzymes: No results for input(s): CKTOTAL, CKMB, CKMBINDEX, TROPONINI in the last 168 hours. BNP: Invalid input(s): POCBNP CBG: Recent Labs  Lab 03/06/18 0908 03/06/18 1149 03/06/18 1317  GLUCAP 179* 69* 86   D-Dimer No results for input(s): DDIMER in the last 72 hours. Hgb A1c Recent Labs    03/05/18 2306  HGBA1C 6.2*   Lipid Profile  No results for input(s): CHOL, HDL, LDLCALC, TRIG, CHOLHDL, LDLDIRECT in the last 72 hours. Thyroid function studies Recent Labs    03/05/18 2306  TSH 1.567   Anemia work up No results for input(s): VITAMINB12, FOLATE, FERRITIN, TIBC, IRON, RETICCTPCT in the last 72 hours. Urinalysis    Component Value Date/Time   COLORURINE YELLOW 03/05/2018 [place] 03/05/2018 1247   LABSPEC 1.012 03/05/2018 1247   PHURINE 7.0 03/05/2018 1247   GLUCOSEU NEGATIVE 03/05/2018 1247   HGBUR NEGATIVE 03/05/2018 1247   BILIRUBINUR NEGATIVE 03/05/2018 1247   KETONESUR NEGATIVE 03/05/2018 1247   PROTEINUR 30 (A) 03/05/2018 1247   NITRITE NEGATIVE 03/05/2018 1247   LEUKOCYTESUR NEGATIVE 03/05/2018 1247   Sepsis Labs Invalid input(s): PROCALCITONIN,  WBC,  LACTICIDVEN   Time coordinating discharge: 40  minutes  SIGNED:  Marzetta Board, MD  [facility] 03/06/2018, 5:49 PM Pager [phone]  If 7PM-7AM, please contact night-coverage [email or web address] Password [email or web address]

## 2018-03-06 NOTE — Discharge Instructions (Addendum)
Follow with Kuneff, Renee A, DO in 5-7 days  Stop aspirin and plavix for 7 days  Please get a complete blood count and chemistry panel checked by your Primary MD at your next visit, and again as instructed by your Primary MD. Please get your medications reviewed and adjusted by your Primary MD.  Please request your Primary MD to go over all Hospital Tests and Procedure/Radiological results at the follow up, please get all Hospital records sent to your Prim MD by signing hospital release before you go home.  If you had Pneumonia of Lung problems at the Hospital: Please get a 2 view Chest X ray done in 6-8 weeks after hospital discharge or sooner if instructed by your Primary MD.  If you have Congestive Heart Failure: Please call your Cardiologist or Primary MD anytime you have any of the following symptoms:  1) 3 pound weight gain in 24 hours or 5 pounds in 1 week  2) shortness of breath, with or without a dry hacking cough  3) swelling in the hands, feet or stomach  4) if you have to sleep on extra pillows at night in order to breathe  Follow cardiac low salt diet and 1.5 lit/day fluid restriction.  If you have diabetes Accuchecks 4 times/day, Once in AM empty stomach and then before each meal. Log in all results and show them to your primary doctor at your next visit. If any glucose reading is under 80 or above 300 call your primary MD immediately.  If you have Seizure/Convulsions/Epilepsy: Please do not drive, operate heavy machinery, participate in activities at heights or participate in high speed sports until you have seen by Primary MD or a Neurologist and advised to do so again.  If you had Gastrointestinal Bleeding: Please ask your Primary MD to check a complete blood count within one week of discharge or at your next visit. Your endoscopic/colonoscopic biopsies that are pending at the time of discharge, will also need to followed by your Primary MD.  Get Medicines reviewed and  adjusted. Please take all your medications with you for your next visit with your Primary MD  Please request your Primary MD to go over all hospital tests and procedure/radiological results at the follow up, please ask your Primary MD to get all Hospital records sent to his/her office.  If you experience worsening of your admission symptoms, develop shortness of breath, life threatening emergency, suicidal or homicidal thoughts you must seek medical attention immediately by calling 911 or calling your MD immediately  if symptoms less severe.  You must read complete instructions/literature along with all the possible adverse reactions/side effects for all the Medicines you take and that have been prescribed to you. Take any new Medicines after you have completely understood and accpet all the possible adverse reactions/side effects.   Do not drive or operate heavy machinery when taking Pain medications.   Do not take more than prescribed Pain, Sleep and Anxiety Medications  Special Instructions: If you have smoked or chewed Tobacco  in the last 2 yrs please stop smoking, stop any regular Alcohol  and or any Recreational drug use.  Wear Seat belts while driving.  Please note You were cared for by a hospitalist during your hospital stay. If you have any questions about your discharge medications or the care you received while you were in the hospital after you are discharged, you can call the unit and asked to speak with the hospitalist on call if the hospitalist  that took care of you is not available. Once you are discharged, your primary care physician will handle any further medical issues. Please note that NO REFILLS for any discharge medications will be authorized once you are discharged, as it is imperative that you return to your primary care physician (or establish a relationship with a primary care physician if you do not have one) for your aftercare needs so that they can reassess your need  for medications and monitor your lab values.  You can reach the hospitalist office at phone [phone] or fax [phone]   If you do not have a primary care physician, you can call [phone] for a physician referral.  Activity: As tolerated with Full fall precautions use walker/cane & assistance as needed  Diet: heart healthy  Disposition Home

## 2018-03-06 NOTE — Care Management Note (Signed)
Case Management Note  Patient Details  Name: Marcus Taylor MRN: [id number] Date of Birth: 04/27/1941  Subjective/Objective:     Pt in with syncope. He is from home with his spouse.     PCP: Dr   Raoul Pitch Insurance: [facility] medicare          Action/Plan: Pt discharging home with self care. Pt has transportation home.  Expected Discharge Date:  03/06/18               Expected Discharge Plan:  Home/Self Care  In-House Referral:     Discharge planning Services     Post Acute Care Choice:    Choice offered to:     DME Arranged:    DME Agency:     HH Arranged:    HH Agency:     Status of Service:  Completed, signed off  If discussed at [employer] of Stay Meetings, dates discussed:    Additional Comments:  Pollie Friar, RN 03/06/2018, 12:52 PM

## 2018-03-06 NOTE — Progress Notes (Signed)
Pt discharged home. Discharge instructions were reviewed with the pt. Pt verbalized understanding.  

## 2018-03-06 NOTE — Progress Notes (Signed)
  Echocardiogram 2D Echocardiogram has been performed.  Marcus Taylor 03/06/2018, 11:28 AM

## 2018-03-07 ENCOUNTER — Telehealth: Payer: Self-pay

## 2018-03-07 ENCOUNTER — Encounter (INDEPENDENT_AMBULATORY_CARE_PROVIDER_SITE_OTHER): Payer: Self-pay | Admitting: Orthopaedic Surgery

## 2018-03-07 ENCOUNTER — Telehealth: Payer: Self-pay | Admitting: Hematology and Oncology

## 2018-03-07 ENCOUNTER — Ambulatory Visit (INDEPENDENT_AMBULATORY_CARE_PROVIDER_SITE_OTHER): Payer: Medicare Other | Admitting: Orthopaedic Surgery

## 2018-03-07 ENCOUNTER — Telehealth: Payer: Self-pay | Admitting: Emergency Medicine

## 2018-03-07 VITALS — BP 148/73 | HR 49 | Ht 72.0 in | Wt 204.0 lb

## 2018-03-07 DIAGNOSIS — M4802 Spinal stenosis, cervical region: Secondary | ICD-10-CM

## 2018-03-07 DIAGNOSIS — M9981 Other biomechanical lesions of cervical region: Secondary | ICD-10-CM | POA: Diagnosis not present

## 2018-03-07 NOTE — Telephone Encounter (Signed)
Per 7/25 sch msg.  Called patient with appt w/ Dr. Alvy Bimler for 8/1.

## 2018-03-07 NOTE — Telephone Encounter (Signed)
He called and left a message to call him.  Called back. He is having a lot of pain and went to ER. He is calling about getting a appt with Dr. Alvy Bimler. Told him per Dr. Alvy Bimler, she sent a scheduling message this morning for appt to see him next week. Instructed to go to ER if symptoms get worse. He verbalized understanding.

## 2018-03-07 NOTE — Progress Notes (Signed)
Office Visit Note   Patient: Marcus Taylor           Date of Birth: Dec 23, 1940           MRN: [id number] Visit Date: 03/07/2018              Requested by: [name], DO [id number] [place], [place] [address] PCP: [name], DO   Assessment & Plan: Visit Diagnoses:  1. Spinal stenosis of cervical region   2. Foraminal stenosis of cervical region     Plan:  #1: At this time he is not symptomatic.  He would like to wait at this time and not proceed with evaluation by Dr. Ernestina Patches for injections. #2: He will then give [place] a call if his symptoms return.  I have also gone over with him that if he develops numbness or worsening pain and or motor loss he is to go the emergency room.  Follow-Up Instructions: Return if symptoms worsen or fail to improve.   Face-to-face time spent with patient was greater than 30 minutes.  Greater than 50% of the time was spent in counseling and coordination of care.   Orders:  No orders of the defined types were placed in this encounter.  No orders of the defined types were placed in this encounter.     Procedures: No procedures performed   Clinical Data: No additional findings.   Subjective: Chief Complaint  Patient presents with  . Right Shoulder - Pain    HPI  [name] returns today for follow-up of his cervical spine and right shoulder pain and MRI scan.  History is that he had an insidious onset of pain in his right shoulder before going to bed.  The pain was localized in the parascapular region of the right arm.  He denied any history of injury or trauma.  Shoulder pain.  He also did not have any numbness or tingling.  No symptoms on the left side at time.  He at that time could not reproduce his pain with motion.  Because of his symptoms and MRI scan was ordered.  Returns today for review of the MRI scan.  Incidental noting that he does have enlargement of his spleen which apparently when he lies down puts pressure on  his kidney on the left side.  He is being evaluated for that by his physician.  Review of Systems  Constitutional: Negative for diaphoresis and fatigue.  HENT: Negative for ear pain.   Eyes: Negative for pain.  Respiratory: Negative for cough and shortness of breath.   Cardiovascular: Negative for leg swelling.  Gastrointestinal: Negative for constipation and diarrhea.  Genitourinary: Negative for difficulty urinating.  Musculoskeletal: Positive for back pain and neck pain.  Skin: Negative for rash.  Allergic/Immunologic: Negative for food allergies.  Neurological: Positive for weakness. Negative for numbness.  Hematological: Bruises/bleeds easily.  Psychiatric/Behavioral: Positive for sleep disturbance.     Objective: Vital Signs: BP (!) 148/73 (BP Location: Left Arm, Patient Position: Sitting, Cuff Size: Normal)   Pulse (!) 49   Ht 6' (1.829 m)   Wt 204 lb (92.5 kg)   BMI 27.67 kg/m   Physical Exam  Constitutional: He is oriented to person, place, and time. He appears well-developed and well-nourished.  HENT:  Mouth/Throat: Oropharynx is clear and moist.  Eyes: Pupils are equal, round, and reactive to light. EOM are normal.  Pulmonary/Chest: Effort normal.  Neurological: He is alert and oriented to person, place,  and time.  Skin: Skin is warm and dry.  Psychiatric: He has a normal mood and affect. His behavior is normal.    Ortho Exam  Exam today reveals him quite comfortable.  He does not have much in the way of pain with shoulder exam.  Sensation is intact to light touch.  Specialty Comments:  No specialty comments available.  Imaging: Mr Cervical Spine W/o Contrast  Result Date: 03/05/2018 CLINICAL DATA:  77 year old male with right side neck and shoulder pain for 3 weeks with no known injury. EXAM: MRI CERVICAL SPINE WITHOUT CONTRAST TECHNIQUE: Multiplanar, multisequence MR imaging of the cervical spine was performed. No intravenous contrast was administered.  COMPARISON:  Cervical spine radiographs 02/19/2018. FINDINGS: Alignment: Stable from the recent radiographs. Moderate degenerative appearing anterolisthesis of C7 on T1. Mild degenerative anterolisthesis of C4 on C5. Mild superimposed straightening of cervical lordosis. Vertebrae: Visualized bone marrow signal is within normal limits. Intermittent degenerative endplate marrow signal changes, maximal at C6-C7 and C7-T1. No marrow edema or evidence of acute osseous abnormality. Cord: No definite abnormal spinal cord signal despite stenosis with spinal cord mass effect at C3-C4, details below. The visible upper thoracic spinal cord is within normal limits. Posterior Fossa, vertebral arteries, paraspinal tissues: Cervicomedullary junction is within normal limits. Negative visible brain parenchyma. Dominant and mildly dolichoectatic left vertebral artery, the right is diminutive. The major vascular flow voids in the neck are preserved. There is lobular asymmetric and indeterminate appearing signal abnormality at the right thoracic inlet lateral to the right thyroid seen on series 6, image 30. This is not included on the sagittal images. Etiology is unclear, but these seem to be dilated venous vascular structures. No significant regional mass effect. Disc levels: C2-C3: Mild to moderate facet hypertrophy greater on the right. Mild broad-based posterior disc bulging. Mild ligament flavum hypertrophy. Mild spinal stenosis. No definite spinal cord mass effect. Moderate bilateral C3 foraminal stenosis. C3-C4: Mild left but moderate to severe right side facet hypertrophy with severe ligament flavum hypertrophy. Superimposed circumferential disc bulging and endplate spurring with a broad-based posterior component. Spinal stenosis with mild to moderate spinal cord mass effect. Moderate to severe left and severe right C4 foraminal stenosis. C4-C5: Moderate facet and ligament flavum hypertrophy greater on the right. Superimposed  circumferential disc bulge and endplate spurring with a small broad-based central disc protrusion. Mild spinal stenosis with no cord mass effect. Mild to moderate left and moderate to severe right C5 foraminal stenosis. C5-C6: Disc space loss with bulky but mostly anterior and foraminal circumferential disc osteophyte complex. Mild facet hypertrophy. No significant spinal stenosis. Moderate to severe bilateral C6 foraminal stenosis. C6-C7: Disc space loss with circumferential disc osteophyte complex eccentric to the left. Mild ligament flavum hypertrophy. No significant spinal stenosis. Severe left and moderate to severe right C7 foraminal stenosis. C7-T1: Anterolisthesis with near complete disc space loss. Moderate to severe facet and ligament flavum hypertrophy bilaterally. No significant spinal stenosis, but moderate to severe bilateral C8 foraminal stenosis. No upper thoracic spinal stenosis. IMPRESSION: 1. Suggestion of a venous vascular malformation at the right thoracic inlet (series 6, image 30). Ordinarily clinical significance would be doubtful, but in the setting of right shoulder pain recommend further evaluation. Chest CT with IV contrast may be the simplest way to evaluate further. Alternative imaging modalities include right brachial plexus MRI (without and with contrast), right shoulder MRI (larger than standard field-of-view, without and with contrast). 2. Advanced cervical spine degeneration with multilevel spondylolisthesis. No acute osseous abnormality identified.  3. Multifactorial spinal stenosis with up to moderate spinal cord mass effect at C3-C4, but no cord signal abnormality. Mild spinal stenosis at C2-C3, C4-C5. 4. Multifactorial moderate and severe cervical neural foraminal stenosis at all levels. Electronically Signed   By: Genevie Ann M.D.   On: 03/05/2018 09:39    PMFS History: Patient Active Problem List   Diagnosis Date Noted  . Syncope, vasovagal 03/05/2018  . Syncope and  collapse 03/05/2018  . Neck pain 02/19/2018  . IDA (iron deficiency anemia) 08/09/2016  . Thrombocytopenia ([place]) 05/30/2016  . Deficiency anemia 05/04/2016  . Diabetes mellitus type 2, controlled, with complications ([place]) 93/81/8299  . Right inguinal hernia 02/03/2016  . History of colon polyps 01/27/2016  . History of ST elevation myocardial infarction (STEMI) 01/27/2016  . CAD (coronary artery disease) 01/27/2016  . Hyperlipidemia 01/27/2016  . Anemia in neoplastic disease 05/25/2014  . 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 HTN (hypertension)    Past Medical History:  Diagnosis Date  . Anemia   . Arthritis   . Colonic polyp   . Concussion 03/05/2018  . Coronary artery disease    stents x 3 (03-2014)  . Diabetes mellitus   . Diverticulosis    throughout entire colon  . HTN (hypertension)   . Hypertriglyceridemia   . Kidney stone   . Lymphocytosis   . Metatarsal fracture left   left 5th proximal phalanx   . OSA (obstructive sleep apnea)    "Mild" by records  . PONV (postoperative nausea and vomiting)   . RBBB 01/2016  . 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 STEMI (ST elevation myocardial infarction) [facility])    August 2015    Family History  Problem Relation Age of Onset  . Kidney disease Sister   . Colon cancer Sister 52  . Colon cancer Brother   . Arthritis Mother   . Breast cancer Mother   . Heart disease Mother   . Bladder Cancer Brother   . Arthritis Son   . Esophageal cancer Sister   . Rectal cancer Neg Hx   . Stomach cancer Neg Hx     Past Surgical History:  Procedure Laterality Date  . CARDIAC CATHETERIZATION     stents x 3 (03/2014)  . CHOLECYSTECTOMY    . HERNIA REPAIR    . INGUINAL HERNIA REPAIR Right 05/27/2015   Procedure: LAPAROSCOPIC REPAIR RIGHT  INGUINAL HERNIA;  Surgeon: Greer Pickerel, MD;  Location: [facility];  Service: General;  Laterality: Right;  . INSERTION OF MESH Right 05/27/2015   Procedure: INSERTION  OF MESH;  Surgeon: Greer Pickerel, MD;  Location: [facility];  Service: General;  Laterality: Right;  . LUMBAR DISC SURGERY    . 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 Smoking status: Former Smoker    Packs/day: 1.00    Years: 20.00    Pack years: 20.00    Last attempt to quit: 08/14/1988    Years since quitting: 29.5  . Smokeless tobacco: Never Used  Substance and Sexual Activity  . Alcohol use: No  . Drug use: No  . Sexual activity: Yes    Birth control/protection: None

## 2018-03-07 NOTE — Telephone Encounter (Signed)
Transition Care Management Follow-up Telephone Call   Date discharged? 03/07/2018   How have you been since you were released from the hospital? " I have been doing pretty good I do have "a Headache and Some abdominal pain and back pain, but they say that's where My spleen is enlarged"   Do you understand why you were in the hospital? Yes, " I fell and hit my head and I have a concussion"    Do you understand the discharge instructions? yes  Where were you discharged to? Home   Items Reviewed:  Medications reviewed: yes  Allergies reviewed: yes  Dietary changes reviewed: yes  Referrals reviewed: no   Functional Questionnaire:   Activities of Daily Living (ADLs):   He states they are independent in the following: ambulation, bathing and hygiene, feeding, continence, grooming, toileting and dressing States they require assistance with the following: None    Any transportation issues/concerns?: no   Any patient concerns? no   Confirmed importance and date/time of follow-up visits scheduled no  Provider Appointment booked with` Dr. Raoul Pitch 03/13/2018 @ 11:00am   Confirmed with patient if condition begins to worsen call PCP or go to the ER.  Patient was given the office number and encouraged to call back with question or concerns.  : no

## 2018-03-08 ENCOUNTER — Ambulatory Visit: Payer: Medicare Other | Admitting: Podiatry

## 2018-03-08 ENCOUNTER — Ambulatory Visit (INDEPENDENT_AMBULATORY_CARE_PROVIDER_SITE_OTHER): Payer: Medicare Other | Admitting: Orthopaedic Surgery

## 2018-03-12 ENCOUNTER — Ambulatory Visit (INDEPENDENT_AMBULATORY_CARE_PROVIDER_SITE_OTHER): Payer: Medicare Other | Admitting: Family Medicine

## 2018-03-12 ENCOUNTER — Telehealth: Payer: Self-pay | Admitting: Family Medicine

## 2018-03-12 ENCOUNTER — Encounter: Payer: Self-pay | Admitting: Family Medicine

## 2018-03-12 VITALS — BP 142/74 | HR 58 | Temp 97.4°F | Resp 20 | Ht 72.0 in | Wt 208.5 lb

## 2018-03-12 DIAGNOSIS — Z7189 Other specified counseling: Secondary | ICD-10-CM | POA: Diagnosis not present

## 2018-03-12 DIAGNOSIS — S066X9A Traumatic subarachnoid hemorrhage with loss of consciousness of unspecified duration, initial encounter: Secondary | ICD-10-CM | POA: Insufficient documentation

## 2018-03-12 DIAGNOSIS — S066X0A Traumatic subarachnoid hemorrhage without loss of consciousness, initial encounter: Secondary | ICD-10-CM

## 2018-03-12 DIAGNOSIS — Z9289 Personal history of other medical treatment: Secondary | ICD-10-CM | POA: Diagnosis not present

## 2018-03-12 DIAGNOSIS — S066XAA Traumatic subarachnoid hemorrhage with loss of consciousness status unknown, initial encounter: Secondary | ICD-10-CM

## 2018-03-12 DIAGNOSIS — N400 Enlarged prostate without lower urinary tract symptoms: Secondary | ICD-10-CM

## 2018-03-12 DIAGNOSIS — C8307 Small cell B-cell lymphoma, spleen: Secondary | ICD-10-CM | POA: Diagnosis not present

## 2018-03-12 DIAGNOSIS — R161 Splenomegaly, not elsewhere classified: Secondary | ICD-10-CM | POA: Insufficient documentation

## 2018-03-12 DIAGNOSIS — Z7689 Persons encountering health services in other specified circumstances: Secondary | ICD-10-CM

## 2018-03-12 HISTORY — DX: Traumatic subarachnoid hemorrhage with loss of consciousness of unspecified duration, initial encounter: S06.6X9A

## 2018-03-12 HISTORY — DX: Traumatic subarachnoid hemorrhage with loss of consciousness status unknown, initial encounter: S06.6XAA

## 2018-03-12 LAB — CBC WITH DIFFERENTIAL/PLATELET
Basophils Absolute: 0.1 10*3/uL (ref 0.0–0.1)
Basophils Relative: 0.4 % (ref 0.0–3.0)
Eosinophils Absolute: 0.1 10*3/uL (ref 0.0–0.7)
Eosinophils Relative: 0.8 % (ref 0.0–5.0)
HCT: 38.8 % — ABNORMAL LOW (ref 39.0–52.0)
Hemoglobin: 12.8 g/dL — ABNORMAL LOW (ref 13.0–17.0)
Lymphocytes Relative: 80.8 % — ABNORMAL HIGH (ref 12.0–46.0)
Lymphs Abs: 12.4 10*3/uL — ABNORMAL HIGH (ref 0.7–4.0)
MCHC: 32.9 g/dL (ref 30.0–36.0)
MCV: 86.3 fl (ref 78.0–100.0)
Monocytes Absolute: 0.4 10*3/uL (ref 0.1–1.0)
Monocytes Relative: 2.6 % — ABNORMAL LOW (ref 3.0–12.0)
Neutro Abs: 2.4 10*3/uL (ref 1.4–7.7)
Neutrophils Relative %: 15.4 % — ABNORMAL LOW (ref 43.0–77.0)
Platelets: 183 10*3/uL (ref 150.0–400.0)
RBC: 4.5 Mil/uL (ref 4.22–5.81)
RDW: 17.1 % — ABNORMAL HIGH (ref 11.5–15.5)
WBC: 15.4 10*3/uL — ABNORMAL HIGH (ref 4.0–10.5)

## 2018-03-12 LAB — COMPREHENSIVE METABOLIC PANEL
ALBUMIN: 4.2 g/dL (ref 3.5–5.2)
ALK PHOS: 72 U/L (ref 39–117)
ALT: 17 U/L (ref 0–53)
AST: 29 U/L (ref 0–37)
BUN: 15 mg/dL (ref 6–23)
CO2: 33 mEq/L — ABNORMAL HIGH (ref 19–32)
Calcium: 9.3 mg/dL (ref 8.4–10.5)
Chloride: 105 mEq/L (ref 96–112)
Creatinine, Ser: 0.79 mg/dL (ref 0.40–1.50)
GFR: 101.15 mL/min (ref >60.00)
GLUCOSE: 69 mg/dL — AB (ref 70–99)
Potassium: 4.6 mEq/L (ref 3.5–5.1)
Sodium: 142 mEq/L (ref 135–145)
TOTAL PROTEIN: 6.1 g/dL (ref 6.0–8.3)
Total Bilirubin: 0.4 mg/dL (ref 0.2–1.2)

## 2018-03-12 LAB — PSA: PSA: 1.85 ng/mL (ref 0.10–4.00)

## 2018-03-12 NOTE — Patient Instructions (Signed)
I will call you tomorrow on your labs and we will more than likely restart the aspirin and plavix tomorrow.  If your headache continues to occur daily after 2 more weeks, or worsens we will want to see you again.  Please be seen in ED if condition severely worsens or any numbness/tingling etc.     Subarachnoid Hemorrhage Subarachnoid hemorrhage is bleeding in the area between the brain and the membrane that covers the brain. The bleeding puts more pressure on the brain and stops blood from reaching some areas of the brain. It is very serious. It may cause brain damage, stroke, or death if not treated. You must be treated in the hospital right away. What increases the risk? You may be more likely to have this condition if you:  Smoke.  Have high blood pressure (hypertension).  Drink too much alcohol.  Are a male, especially after menopause.  Have a family history of disease in the blood vessels of the brain.  Have a certain inherited kidney disease or connective tissue disease.  What are the signs or symptoms?  Having a sudden, severe headache.  Feeling sick to your stomach (nauseous) or throwing up (vomiting) combined with other problems.  Suddenly feeling weak.  Losing feeling on your face, arm, or leg, especially on one side of the body.  Suddenly having trouble walking or moving your arms or legs.  Suddenly feeling confused.  Suddenly having a change in mood or personality.  Having trouble talking or understanding.  Having trouble swallowing.  Suddenly having trouble seeing.  Seeing double.  Feeling dizzy.  Losing your balance or coordination.  Having light bother or hurt your eyes.  Having a stiff neck. Follow these instructions at home:  Take all medicines exactly as told by your doctor.  Eat healthy foods if you can swallow. ? Eat foods that are low in salt and cholesterol. ? Eat foods that are low in saturated and trans fat. ? If told, eat soft or  pureed foods so that you do not choke. ? If told, take small bites of food so that you do not choke.  Rest as told by your doctor.  Limit your activity as told by your doctor.  Do not smoke.  Limit how much alcohol you drink. ? Men-drink no more than 2 drinks a day. ? Women who are not pregnant-drink no more than 1 drink a day.  Make changes to your lifestyle as told by your doctor.  Keep track of your blood pressure as told by your doctor.  Keep your home safe so you do not fall. ? Put grab bars in the bedroom and bathroom. ? Raise toilet seats. ? Put a seat in the shower.  Go to therapy sessions as told by your doctor. This may include physical, occupational, and speech therapy.  Use a walker or cane at all times, if told to do so.  Keep all follow-up visits with your doctor and other specialists. Get help right away if:  You have a sudden, severe headache with no known cause.  You are sick to your stomach or throw up, and have another problem.  You have a sudden weakness.  You lose feeling on one side of your body.  You suddenly have trouble walking or moving arms or legs.  You suddenly feel confused.  You have trouble talking or understanding.  You suddenly have trouble seeing.  You lose your balance or your movements are not coordinated.  You have a  stiff neck.  You have trouble breathing.  You are partly or totally unaware of what is going on around you. The symptoms above may be a sign of a serious problem that is an emergency. Do not wait to see if the symptoms will go away. Get medical help right away. Call your local emergency services (911 in [place] Do not drive yourself to the hospital. This information is not intended to replace advice given to you by your health care provider. Make sure you discuss any questions you have with your health care provider. Document Released: 11/25/2012 Document Revised: 01/06/2016 Document Reviewed: 09/13/2012 Elsevier  Interactive Patient Education  [employer].

## 2018-03-12 NOTE — Progress Notes (Signed)
Marcus Taylor , 1940-12-28, 77 y.o., male MRN: [id number] Patient Care Team    Relationship Specialty Notifications Start End  [name], DO PCP - General Family Medicine  01/26/16   [name], MD (Inactive) Consulting Physician Gastroenterology  01/26/16   [name], MD Consulting Physician Hematology and Oncology  01/26/16   [name], MD Referring Physician Cardiology  01/26/16   [name], MD Consulting Physician Neurosurgery  01/27/16   [name], OD Referring Physician   05/03/16    Comment: opthalmology     Chief Complaint  Patient presents with  . Hospitalization Follow-up    TCM     Subjective:  Marcus Taylor  is a 77 y.o. male presents for hospital follow up after recent admission on 03/05/2018 for primary diagnosis fall with head injury. Pt was discharged on 03/06/2018 to home. Patients discharge summary has been reviewed, as well as all labs/image studies obtained during hospitalization.   Patients hospital course: Pt reports a mechanical fall after tripping on when attempting to pick up clothes in his closet. He reports he hit his head pretty hard, but denies LOC. He does have some amnesia surrounding the immediate after effects of the injury. He does not recall walking down the hall towards his wife. He also complained of left sided back/flank pain and increased fatigue.    Hospital Course: After fall pt was admitted to the hospital for  bradycardia (chronic) and mild contusion seen on the CT head.  He remained clinically and neurologically stable, he underwent a repeat CT scan the next morning which showed regressed but not fully resolved trace posttraumatic subarachnoid hemorrhage within the right cingulate sulcus.  He was asymptomatic, ambulating well in the hallway without significant lightheadedness or dizziness, and was discharged home in stable condition and to hold his dual antiplatelet therapy for 7 days. His CT also showed enlarged  prostate of 6.1 cm and splenomegaly. He denies urinary changes. Flank pain is felt to be secondary to the enlarged spleen. He is followed with oncology Dr. Alvy Bimler for splenic marginal zone lymphoma and he has a follow up in 2 days with him concerning the discomfort and enlarged spleen.   Since hospital discharge patient reports he is doing ok. He is still have some headache in the left anterior parietal area. He is eating and drinking well. He denies increased pain of the head, dizziness, weakness, nausea, vomit, numbness or tingling. He has returned to his normal baseline mental capacity.  He has held his ASA and plavix, due to restart tomorrow by inpatient team instructions.   Recent Labs  Lab 03/05/18 2306 03/06/18 0357  HGB 12.6* 11.8*  HCT 40.0 37.1*  WBC 17.0* 17.3*  PLT 150 159   CMP Latest Ref Rng & Units 03/06/2018 03/05/2018 03/05/2018  Glucose 70 - 99 mg/dL 108(H) - 96  BUN 8 - 23 mg/dL 17 - 17  Creatinine 0.61 - 1.24 mg/dL 0.98 1.03 0.81  Sodium 135 - 145 mmol/L 142 - 141  Potassium 3.5 - 5.1 mmol/L 4.0 - 4.2  Chloride 98 - 111 mmol/L 109 - 108  CO2 22 - 32 mmol/L 26 - 25  Calcium 8.9 - 10.3 mg/dL 8.5(L) - 9.0  Total Protein 6.5 - 8.1 g/dL 5.4(L) - 5.9(L)  Total Bilirubin 0.3 - 1.2 mg/dL 0.6 - 0.6  Alkaline Phos 38 - 126 U/L 62 - 73  AST 15 - 41 U/L 31 - 38  ALT 0 -  44 U/L 21 - 24     03/06/2018 Echocardigram LV EF: 60% -   65% ------------------------------------------------------------------- Indications:      Syncope 780.2. ------------------------------------------------------------------- History:   PMH:   Coronary artery disease.  Risk factors: Hypertension. Dyslipidemia. ------------------------------------------------------------------- Study Conclusions - Left ventricle: The cavity size was normal. Wall thickness was   increased in a pattern of mild LVH. There was mild concentric   hypertrophy. Systolic function was normal. The estimated ejection    fraction was in the range of 60% to 65%. Wall motion was normal;   there were no regional wall motion abnormalities. The study is   not technically sufficient to allow evaluation of LV diastolic   function. - Aortic valve: There was mild regurgitation. - Left atrium: The atrium was mildly dilated. - Atrial septum: No defect or patent foramen ovale was identified. - Impressions: Patient appeared to have 3:2 wenkebach during exam. Impressions: - Patient appeared to have 3:2 wenkebach during exam.  Ct Abdomen Pelvis Wo Contrast Result Date: 03/05/2018  IMPRESSION: No acute abnormality in the abdomen or pelvis. Splenomegaly without evidence for a significant splenic laceration or hemoperitoneum. There is trace stranding or fluid along the caudal aspect of the spleen but the chronicity is unknown. Study has limitations from motion artifact and lack of intravenous contrast. Prostate enlargement. Electronically Signed   By: Markus Daft M.D.   On: 03/05/2018 14:47   Dg Chest 2 View Result Date: 03/05/2018  IMPRESSION: No evidence of active disease. Electronically Signed   By: Monte Fantasia M.D.   On: 03/05/2018 13:50   Ct Head Wo Contrast Result Date: 03/06/2018  IMPRESSION: 1. Regressed but not fully resolved trace posttraumatic subarachnoid hemorrhage within the right cingulate sulcus. 2. No new intracranial abnormality. Electronically Signed   By: Genevie Ann M.D.   On: 03/06/2018 10:43   Ct Cervical Spine Wo Contrast Result Date: 03/05/2018 IMPRESSION: CT of the head: Small focus of increased attenuation along the falx review to the right of the midline as described above. This likely represents a small focus of contusion. No other significant hemorrhage is identified. Mild atrophic changes. Sinus disease as described. CT of the cervical spine: Multilevel degenerative changes similar to that seen on recent MRI from earlier in the same day. No acute fracture is noted. Aberrant right subclavian artery.  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 Electronically Signed   By: Inez Catalina M.D.   On: 03/05/2018 14:46   Mr Cervical Spine W/o Contrast Result Date: 03/05/2018  IMPRESSION: 1. Suggestion of a venous vascular malformation at the right thoracic inlet (series 6, image 30). Ordinarily clinical significance would be doubtful, but in the setting of right shoulder pain recommend further evaluation. Chest CT with IV contrast may be the simplest way to evaluate further. Alternative imaging modalities include right brachial plexus MRI (without and with contrast), right shoulder MRI (larger than standard field-of-view, without and with contrast). 2. Advanced cervical spine degeneration with multilevel spondylolisthesis. No acute osseous abnormality identified. 3. Multifactorial spinal stenosis with up to moderate spinal cord mass effect at C3-C4, but no cord signal abnormality. Mild spinal stenosis at C2-C3, C4-C5. 4. Multifactorial moderate and severe cervical neural foraminal stenosis at all levels. Electronically Signed   By: Genevie Ann M.D.   On: 03/05/2018 09:39     Depression screen [facility] 2/9 03/12/2018 01/17/2018 03/19/2017 01/24/2016  Decreased  Interest 0 0 0 0  Down, Depressed, Hopeless 0 0 0 -  PHQ - 2 Score 0 0 0 0    No Known Allergies Social History   Tobacco Use  . Smoking status: Former Smoker    Packs/day: 1.00    Years: 20.00    Pack years: 20.00    Last attempt to quit: 08/14/1988    Years since quitting: 29.5  . Smokeless tobacco: Never Used  Substance Use Topics  . Alcohol use: No   Past Medical History:  Diagnosis Date  . Anemia   . Arthritis   . Colonic polyp   . Concussion 03/05/2018  . Coronary artery disease    stents x 3 (03-2014)  . Diabetes mellitus   . Diverticulosis    throughout entire colon  . HTN (hypertension)   . Hypertriglyceridemia   .  Kidney stone   . Lymphocytosis   . Metatarsal fracture left   left 5th proximal phalanx   . OSA (obstructive sleep apnea)    "Mild" by records  . PONV (postoperative nausea and vomiting)   . RBBB 01/2016  . Splenic marginal zone b-cell lymphoma ([place]) 12/18/2011  . STEMI (ST elevation myocardial infarction) [facility])    August 2015   Past Surgical History:  Procedure Laterality Date  . CARDIAC CATHETERIZATION     stents x 3 (03/2014)  . CHOLECYSTECTOMY    . HERNIA REPAIR    . INGUINAL HERNIA REPAIR Right 05/27/2015   Procedure: LAPAROSCOPIC REPAIR RIGHT  INGUINAL HERNIA;  Surgeon: Greer Pickerel, MD;  Location: [facility];  Service: General;  Laterality: Right;  . INSERTION OF MESH Right 05/27/2015   Procedure: INSERTION OF MESH;  Surgeon: Greer Pickerel, MD;  Location: [facility];  Service: General;  Laterality: Right;  . LUMBAR DISC SURGERY    . TONSILLECTOMY AND ADENOIDECTOMY     Family History  Problem Relation Age of Onset  . Kidney disease Sister   . Colon cancer Sister 58  . Colon cancer Brother   . Arthritis Mother   . Breast cancer Mother   . Heart disease Mother   . Bladder Cancer Brother   . Arthritis Son   . Esophageal cancer Sister   . Rectal cancer Neg Hx   . Stomach cancer Neg Hx    Allergies as of 03/12/2018   No Known Allergies     Medication List        Accurate as of 03/12/18  4:24 PM. Always use your most recent med list.          aspirin EC 81 MG tablet Take 81 mg by mouth daily.   atorvastatin 40 MG tablet Commonly known as:  LIPITOR Take 1 tablet (40 mg total) by mouth daily.   clopidogrel 75 MG tablet Commonly known as:  PLAVIX Take 75 mg by mouth daily.   ferrous sulfate 325 (65 FE) MG tablet Take 325 mg by mouth daily with breakfast.   lisinopril 20 MG tablet Commonly known as:  PRINIVIL,ZESTRIL Take 1 tablet (20 mg total) by mouth daily.   metFORMIN 500 MG 24 hr tablet Commonly known as:  GLUCOPHAGE-XR Take 1 tablet (500 mg total) by mouth  daily with breakfast. TAKE 1 TABLET BY MOUTH WITH EVENING MEAL M-W-F   metoprolol tartrate 25 MG tablet Commonly known as:  LOPRESSOR Take 0.5 tablets (12.5 mg total) by mouth 2 (two) times daily.   nitroGLYCERIN 0.4 MG SL tablet Commonly known as:  NITROSTAT Place 1 tablet (  0.4 mg total) under the tongue every 5 (five) minutes as needed for chest pain.       All past medical history, surgical history, allergies, family history, immunizations and medications were updated in the EMR today and reviewed under the history and medication portions of their EMR.      ROS: Negative, with the exception of above mentioned in HPI   Objective:  BP (!) 142/74 (BP Location: Right Arm, Patient Position: Sitting, Cuff Size: Large)   Pulse (!) 58   Temp (!) 97.4 F (36.3 C)   Resp 20   Ht 6' (1.829 m)   Wt 208 lb 8 oz (94.6 kg)   SpO2 97%   BMI 28.28 kg/m  Body mass index is 28.28 kg/m. Gen: Afebrile. No acute distress. Nontoxic in appearance, well developed, well nourished.  Caucasian male. HENT: AT. [place]. Bilateral TM visualized without erythema, bulging or signs of bleedign. MMM, no oral lesions. Eyes:Pupils Equal Round Reactive to light, Extraocular movements intact,  Conjunctiva without redness, discharge or icterus. Neck/lymp/endocrine: Supple,no lymphadenopathy CV: RRR no murmur, no edema Chest: CTAB, no wheeze or crackles. Good air movement, normal resp effort.  Abd: Soft. NTND. BS present.  Skin: no rashes, purpura or petechiae.  Neuro:  Normal gait. PERLA. EOMi. Alert. Oriented x3 Cranial nerves II through XII intact. Muscle strength 5/5 lateral upper and lower extremity.  Psych: Normal affect, dress and demeanor. Normal speech. Normal thought content and judgment.   Assessment/Plan: Marcus Taylor is a 77 y.o. male present for OV for Hospital discharge follow up Subarachnoid hematoma, without loss of consciousness, initial encounter (HCC)/Fall/History of recent  hospitalization/Encounter for support and coordination of transition of care -Mechanical fall.  Overall patient is doing well.  He looks well today.  He does complain of a continued mild headache that is improving daily.  He states that his lower back/splenomegaly is bothering him more than his head. -He declined pain medication today.  He states he is doing okay right now. - Check labs today, if stable restart Plavix and aspirin tomorrow which will be day 8 after injury. - CBC w/Diff - Comp Met (CMET) - If headache continues greater than 2 weeks without improvement , patient is to follow-up - If headache continues without resolution past 4 weeks patient is to follow-up.  -If any symptoms worsen as worsening headache, dizziness, numbness, tingling, nausea or vomit patient is to be seen daily in the emergency room.  Patient and wife understand instructions today. AVS education on subarachnoid hemorrhage and warning signs provided to them today.  Enlarged prostate without lower urinary tract symptoms (luts) New incidental finding on imaging. Patient asking about this today. No urinary changes. Discussed normal size and his reported prostrate size (6.1cm). No PSA on file.  - PSA ordered. If normal would not continue to repeat given age unless symptomatic   Splenic marginal zone b-cell lymphoma (HCC)/Splenomegaly - he has an appt in 2 days with his heme/onc. -He declined pain medication today. -The recommendations are for him to undergo splenectomy, he would need updates on his Hib conjugate, Pneumovax 23 since it was completed greater than 6 years ago, meningococcal conjugate vaccine (and possibly menB)   Reviewed expectations re: course of current medical issues.  Discussed self-management of symptoms.  Outlined signs and symptoms indicating need for more acute intervention.  Patient verbalized understanding and all questions were answered.  Patient received an After-Visit Summary.  Any  changes in medications were reviewed and patient was provided  with updated med list with their AVS.      Orders Placed This Encounter  Procedures  . CBC w/Diff  . Comp Met (CMET)  . PSA     Note is dictated utilizing voice recognition software. Although note has been proof read prior to signing, occasional typographical errors still can be missed. If any questions arise, please do not hesitate to call for verification.   electronically signed by:  Howard Pouch, DO  [place]

## 2018-03-12 NOTE — Telephone Encounter (Signed)
Please call pt first in the morning so he can restart his medications- Please inform patient the following information: His labs are stable. He can restart the ASA and plavix.  His sugar was mildly low at 69 (70 normal), please encourage him to eat at least 3 times a day.

## 2018-03-13 NOTE — Telephone Encounter (Signed)
Patient notified and verbalized understanding. 

## 2018-03-14 ENCOUNTER — Inpatient Hospital Stay: Payer: Medicare Other | Attending: Hematology and Oncology | Admitting: Hematology and Oncology

## 2018-03-14 ENCOUNTER — Telehealth: Payer: Self-pay | Admitting: Hematology and Oncology

## 2018-03-14 DIAGNOSIS — D630 Anemia in neoplastic disease: Secondary | ICD-10-CM

## 2018-03-14 DIAGNOSIS — R1012 Left upper quadrant pain: Secondary | ICD-10-CM | POA: Diagnosis not present

## 2018-03-14 DIAGNOSIS — E118 Type 2 diabetes mellitus with unspecified complications: Secondary | ICD-10-CM | POA: Diagnosis not present

## 2018-03-14 DIAGNOSIS — C8307 Small cell B-cell lymphoma, spleen: Secondary | ICD-10-CM

## 2018-03-14 MED ORDER — PREDNISONE 20 MG PO TABS: 20.0000 mg | ORAL_TABLET | Freq: Every day | ORAL | 0 refills | Status: DC

## 2018-03-14 NOTE — Telephone Encounter (Signed)
Gave avs and calendar ° °

## 2018-03-15 ENCOUNTER — Encounter: Payer: Self-pay | Admitting: Hematology and Oncology

## 2018-03-15 ENCOUNTER — Ambulatory Visit: Payer: Medicare Other | Admitting: Podiatry

## 2018-03-15 ENCOUNTER — Encounter: Payer: Self-pay | Admitting: Podiatry

## 2018-03-15 DIAGNOSIS — D689 Coagulation defect, unspecified: Secondary | ICD-10-CM | POA: Diagnosis not present

## 2018-03-15 DIAGNOSIS — R1012 Left upper quadrant pain: Secondary | ICD-10-CM | POA: Insufficient documentation

## 2018-03-15 DIAGNOSIS — B351 Tinea unguium: Secondary | ICD-10-CM | POA: Diagnosis not present

## 2018-03-15 DIAGNOSIS — M79676 Pain in unspecified toe(s): Secondary | ICD-10-CM | POA: Diagnosis not present

## 2018-03-15 NOTE — Assessment & Plan Note (Signed)
I have reviewed recent CT imaging with the patient and his wife He is mildly symptomatic from splenic enlargement related to his marginal zone lymphoma We discussed surveillance versus treatment We discussed surgical options versus immunotherapy versus chemotherapy After a very prolonged discussion, the patient is in favor of observation only We discussed the role of low-dose prednisone therapy for his symptoms and he agreed to try I plan to see him back again in the month for further follow-up

## 2018-03-15 NOTE — Progress Notes (Signed)
[facility] OFF[facility] PROGRESS NOTE  Patient Care Team: [name], DO as PCP - General (Family Medicine) [name], MD (Inactive) as Consulting Physician (Gastroenterology) [name], MD as Consulting Physician (Hematology and Oncology) [name], MD as Referring Physician (Cardiology) [name], MD as Consulting Physician (Neurosurgery) [name], OD as Referring Physician  ASSESSMENT & PLAN:  Splenic marginal zone b-cell lymphoma ([place]) I have reviewed recent CT imaging with the patient and his wife He is mildly symptomatic from splenic enlargement related to his marginal zone lymphoma We discussed surveillance versus treatment We discussed surgical options versus immunotherapy versus chemotherapy After a very prolonged discussion, the patient is in favor of observation only We discussed the role of low-dose prednisone therapy for his symptoms and he agreed to try I plan to see him back again in the month for further follow-up  Anemia in neoplastic disease He had extensive evaluation for iron deficiency anemia, responded well with oral iron supplement He will continue iron supplement indefinitely  Left upper quadrant pain He has intermittent left upper quadrant pain secondary to splenic enlargement Over-the-counter analgesics does not help I recommend low-dose prednisone therapy and he agreed to try I warned him about risk of hyperglycemia, high blood pressure, fluid retention and insomnia with prednisone  Diabetes mellitus type 2, controlled, with complications ([place]) I warned him about risk of hyperglycemia with prednisone therapy He is advised to monitor his blood sugar carefully   No orders of the defined types were placed in this encounter.   INTERVAL HISTORY: Please see below for problem oriented charting. He returns to review test results in regards to recent CT imaging. He is recovering well since recent hospitalization He  complained of significant intermittent left upper quadrant pain He has difficulty lying down to sleep and he has a sleep in the recliner as a consequence of that He denies abnormal lymphadenopathy, night sweats or reduced appetite No abnormal weight loss No recent infection, fever or chills The patient denies any recent signs or symptoms of bleeding such as spontaneous epistaxis, hematuria or hematochezia.  SUMMARY OF ONCOLOGIC HISTORY:  This is a very pleasant gentleman who was found to have abnormal CBC for the past 5 years. In April 2013, flow cytometry detectable monoclonal B-cell population suspicious for splenic/marginal zone lymphoma. Ultrasound confirmed splenomegaly. Due to lack of symptoms he was being observed. In October 2017, he was noted to have microcytic, iron deficiency anemia. He was started on oral iron supplement On 03/05/2018, the patient had an accidental fall.  CT imaging revealed significant splenomegaly  REVIEW OF SYSTEMS:   Constitutional: Denies fevers, chills or abnormal weight loss Eyes: Denies blurriness of vision Ears, nose, mouth, throat, and face: Denies mucositis or sore throat Respiratory: Denies cough, dyspnea or wheezes Cardiovascular: Denies palpitation, chest discomfort or lower extremity swelling Gastrointestinal:  Denies nausea, heartburn or change in bowel habits Skin: Denies abnormal skin rashes Lymphatics: Denies new lymphadenopathy or easy bruising Neurological:Denies numbness, tingling or new weaknesses Behavioral/Psych: Mood is stable, no new changes  All other systems were reviewed with the patient and are negative.  I have reviewed the past medical history, past surgical history, social history and family history with the patient and they are unchanged from previous note.  ALLERGIES:  has No Known Allergies.  MEDICATIONS:  Current Outpatient Medications  Medication Sig Dispense Refill  . aspirin EC 81 MG tablet Take 81 mg by mouth  daily.    Marland Kitchen atorvastatin (LIPITOR) 40  MG tablet Take 1 tablet (40 mg total) by mouth daily. 90 tablet 3  . clopidogrel (PLAVIX) 75 MG tablet Take 75 mg by mouth daily.    . ferrous sulfate 325 (65 FE) MG tablet Take 325 mg by mouth daily with breakfast.    . lisinopril (PRINIVIL,ZESTRIL) 20 MG tablet Take 1 tablet (20 mg total) by mouth daily. 90 tablet 1  . metFORMIN (GLUCOPHAGE-XR) 500 MG 24 hr tablet Take 1 tablet (500 mg total) by mouth daily with breakfast. TAKE 1 TABLET BY MOUTH WITH EVENING MEAL M-W-F 45 tablet 0  . metoprolol tartrate (LOPRESSOR) 25 MG tablet Take 0.5 tablets (12.5 mg total) by mouth 2 (two) times daily. 90 tablet 1  . nitroGLYCERIN (NITROSTAT) 0.4 MG SL tablet Place 1 tablet (0.4 mg total) under the tongue every 5 (five) minutes as needed for chest pain. 10 tablet 0  . predniSONE (DELTASONE) 20 MG tablet Take 1 tablet (20 mg total) by mouth daily with breakfast. 30 tablet 0   Current Facility-Administered Medications  Medication Dose Route Frequency Provider Last Rate Last Dose  . 0.9 %  sodium chloride infusion  500 mL Intravenous Continuous Armbruster, Carlota Raspberry, MD        PHYSICAL EXAMINATION: ECOG PERFORMANCE STATUS: 1 - Symptomatic but completely ambulatory  Vitals:   03/14/18 1323  BP: (!) 138/53  Pulse: (!) 51  Resp: 17  Temp: 97.9 F (36.6 C)  SpO2: 98%   Filed Weights   03/14/18 1323  Weight: 208 lb 3.2 oz (94.4 kg)    GENERAL:alert, no distress and comfortable SKIN: skin color, texture, turgor are normal, no rashes or significant lesions EYES: normal, Conjunctiva are pink and non-injected, sclera clear OROPHARYNX:no exudate, no erythema and lips, buccal mucosa, and tongue normal  NECK: supple, thyroid normal size, non-tender, without nodularity LYMPH:  no palpable lymphadenopathy in the cervical, axillary or inguinal LUNGS: clear to auscultation and percussion with normal breathing effort HEART: regular rate & rhythm and no murmurs and no  lower extremity edema ABDOMEN:abdomen soft, non-tender and normal bowel sounds.  He has palpable splenomegaly Musculoskeletal:no cyanosis of digits and no clubbing  NEURO: alert & oriented x 3 with fluent speech, no focal motor/sensory deficits  LABORATORY DATA:  I have reviewed the data as listed    Component Value Date/Time   NA 142 03/12/2018 1132   NA 144 05/31/2015 1404   K 4.6 03/12/2018 1132   K 4.7 05/31/2015 1404   CL 105 03/12/2018 1132   CL 102 10/07/2012 1013   CO2 33 (H) 03/12/2018 1132   CO2 29 05/31/2015 1404   GLUCOSE 69 (L) 03/12/2018 1132   GLUCOSE 99 05/31/2015 1404   GLUCOSE 199 (H) 10/07/2012 1013   BUN 15 03/12/2018 1132   BUN 18.3 05/31/2015 1404   CREATININE 0.79 03/12/2018 1132   CREATININE 0.86 03/13/2017 0759   CREATININE 0.9 05/31/2015 1404   CALCIUM 9.3 03/12/2018 1132   CALCIUM 9.2 05/31/2015 1404   PROT 6.1 03/12/2018 1132   PROT 6.3 (L) 05/31/2015 1404   ALBUMIN 4.2 03/12/2018 1132   ALBUMIN 3.9 05/31/2015 1404   AST 29 03/12/2018 1132   AST 28 05/31/2015 1404   ALT 17 03/12/2018 1132   ALT 23 05/31/2015 1404   ALKPHOS 72 03/12/2018 1132   ALKPHOS 79 05/31/2015 1404   BILITOT 0.4 03/12/2018 1132   BILITOT 0.46 05/31/2015 1404   GFRNONAA >60 03/06/2018 0357   GFRNONAA 85 03/13/2017 0759   GFRAA >60 03/06/2018 0357  GFRAA >89 03/13/2017 0759    No results found for: SPEP, UPEP  Lab Results  Component Value Date   WBC 15.4 (H) 03/12/2018   NEUTROABS 2.4 03/12/2018   HGB 12.8 (L) 03/12/2018   HCT 38.8 (L) 03/12/2018   MCV 86.3 03/12/2018   PLT 183.0 03/12/2018      Chemistry      Component Value Date/Time   NA 142 03/12/2018 1132   NA 144 05/31/2015 1404   K 4.6 03/12/2018 1132   K 4.7 05/31/2015 1404   CL 105 03/12/2018 1132   CL 102 10/07/2012 1013   CO2 33 (H) 03/12/2018 1132   CO2 29 05/31/2015 1404   BUN 15 03/12/2018 1132   BUN 18.3 05/31/2015 1404   CREATININE 0.79 03/12/2018 1132   CREATININE 0.86 03/13/2017  0759   CREATININE 0.9 05/31/2015 1404      Component Value Date/Time   CALCIUM 9.3 03/12/2018 1132   CALCIUM 9.2 05/31/2015 1404   ALKPHOS 72 03/12/2018 1132   ALKPHOS 79 05/31/2015 1404   AST 29 03/12/2018 1132   AST 28 05/31/2015 1404   ALT 17 03/12/2018 1132   ALT 23 05/31/2015 1404   BILITOT 0.4 03/12/2018 1132   BILITOT 0.46 05/31/2015 1404       RADIOGRAPHIC STUDIES: I have reviewed his recent CT imaging of the abdomen with the patient and family I have personally reviewed the radiological images as listed and agreed with the findings in the report. Ct Abdomen Pelvis Wo Contrast  Result Date: 03/05/2018 CLINICAL DATA:  Chronic flank pain with splenomegaly. Recent fall and need to rule out intra-abdominal hemorrhage. History of B-cell lymphoma. EXAM: CT ABDOMEN AND PELVIS WITHOUT CONTRAST TECHNIQUE: Multidetector CT imaging of the abdomen and pelvis was performed following the standard protocol without IV contrast. COMPARISON:  None. FINDINGS: Lower chest: Linear and patchy densities in left lower lobe are most compatible with atelectasis. No significant pleural fluid. Evidence for coronary artery calcifications. Hepatobiliary: Cholecystectomy. No gross abnormality to the liver on this non contrast examination. Pancreas: Unremarkable. No pancreatic ductal dilatation or surrounding inflammatory changes. Spleen: Splenomegaly. Spleen measures greater than 22 cm in the craniocaudal length. Question trace fluid along the caudal aspect of the spleen but no significant perisplenic fluid or hemorrhage. Adrenals/Urinary Tract: Normal adrenal glands. Mild distention of the urinary bladder. Mild fullness of the right renal collecting system without hydronephrosis. No evidence for kidney or ureter stones. Stomach/Bowel: Large amount of stool in the colon, particularly in the rectum. Colonic diverticulosis without acute inflammation. No evidence for bowel obstruction or focal bowel inflammation. Normal  appearance of stomach and small bowel. Vascular/Lymphatic: Atherosclerotic calcifications involving the aorta and iliac arteries without aortic aneurysm. No significant lymph node enlargement in the abdomen or pelvis. Reproductive: Prostate is enlarged measuring 6.1 cm in transverse dimension. Other: No significant free fluid in the abdomen or pelvis. No evidence for hemoperitoneum. Minimal stranding and questionable trace fluid along the caudal aspect of spleen. Negative for free air. Musculoskeletal: Laminectomy changes at L4. Chronic disc space loss at L4-L5. Motion artifact on this examination. IMPRESSION: No acute abnormality in the abdomen or pelvis. Splenomegaly without evidence for a significant splenic laceration or hemoperitoneum. There is trace stranding or fluid along the caudal aspect of the spleen but the chronicity is unknown. Study has limitations from motion artifact and lack of intravenous contrast. Prostate enlargement. Electronically Signed   By: Markus Daft M.D.   On: 03/05/2018 14:47   Dg Chest 2 View  Result Date: 03/05/2018 CLINICAL DATA:  Left posterior rib and flank pain for 2-3 weeks. EXAM: CHEST - 2 VIEW COMPARISON:  01/16/2008 FINDINGS: Normal heart size. Aortic tortuosity. There is no edema, consolidation, effusion, or pneumothorax. Artifact from EKG leads. Spondylosis. No acute osseous finding. IMPRESSION: No evidence of active disease. Electronically Signed   By: Monte Fantasia M.D.   On: 03/05/2018 13:50   Ct Head Wo Contrast  Result Date: 03/06/2018 CLINICAL DATA:  77 year old male status post fall 2 days ago, struck back of head. Headache and bruising. Suggestion of trace subarachnoid hemorrhage on head CT yesterday. EXAM: CT HEAD WITHOUT CONTRAST TECHNIQUE: Contiguous axial images were obtained from the base of the skull through the vertex without intravenous contrast. COMPARISON:  Head and cervical spine CT 03/05/2018. Cervical spine MRI 03/05/2018. FINDINGS: Brain:  Regressed but not fully resolved trace subarachnoid hemorrhage along the right cingulate sulcus, coronal image 28 today. No new or increased intracranial hemorrhage. No intraventricular hemorrhage. No midline shift, ventriculomegaly, mass effect, evidence of mass lesion, or evidence of cortically based acute infarction. Gray-white matter differentiation is within normal limits throughout the brain. No cortical encephalomalacia identified. Vascular: Calcified atherosclerosis at the skull base. No suspicious intracranial vascular hyperdensity. Skull: Stable and intact. Sinuses/Orbits: Stable. No sinus fluid levels. Tympanic cavities and mastoids are stable and well pneumatized. Other: No acute orbit or scalp soft tissue finding. Partially visible left face subcutaneous nodule measuring 16 mm is probably a benign sebaceous cyst (series 4, image 1). IMPRESSION: 1. Regressed but not fully resolved trace posttraumatic subarachnoid hemorrhage within the right cingulate sulcus. 2. No new intracranial abnormality. Electronically Signed   By: Genevie Ann M.D.   On: 03/06/2018 10:43   Ct Head Wo Contrast  Result Date: 03/05/2018 CLINICAL DATA:  Recent fall with headaches and neck pain, initial encounter EXAM: CT HEAD WITHOUT CONTRAST CT CERVICAL SPINE WITHOUT CONTRAST TECHNIQUE: Multidetector CT imaging of the head and cervical spine was performed following the standard protocol without intravenous contrast. Multiplanar CT image reconstructions of the cervical spine were also generated. COMPARISON:  09/20/2006 CT of the head, 02/19/2018 FINDINGS: CT HEAD FINDINGS Brain: Mild atrophic changes are noted. A small focus of increased attenuation is noted along the falx re-re- just to the right of the midline. This is best seen on image number 24 of series 4 and on the coronal reconstructions on image number 26 of series 6 and on the sagittal reconstructions image 34 of series 7. No other areas of hemorrhage are identified. No  findings to suggest infarction or space-occupying mass lesion are seen. Vascular: No hyperdense vessel or unexpected calcification. Skull: Normal. Negative for fracture or focal lesion. Sinuses/Orbits: Sinuses demonstrates some mild mucosal changes in the ethmoid sinuses as well as mucosal retention cysts in the maxillary antra bilaterally. Other: None CT CERVICAL SPINE FINDINGS Alignment: Alignment is well maintained in the cervical spine. Some anterolisthesis of C7 on T1 of a degenerative nature is noted stable from the previous exam. Skull base and vertebrae: 7 cervical segments are well visualized. Vertebral body height is well maintained. Multilevel disc space narrowing with associated osteophytic change is identified. Facet hypertrophic changes are noted throughout the cervical spine. The odontoid is within normal limits. No acute fracture or acute facet abnormality is identified. Soft tissues and spinal canal: Chronic calcifications are noted. No other soft tissue abnormality is seen. The vascular changes on recent MRI examination likely represents volume averaging through the junction of the right jugular and right subclavian vein.  No definitive vascular mass is noted although contrast was not administered for this exam. Upper chest: There is an aberrant right subclavian artery identified. The remainder of the upper chest is within normal limits. Other: None IMPRESSION: CT of the head: Small focus of increased attenuation along the falx review to the right of the midline as described above. This likely represents a small focus of contusion. No other significant hemorrhage is identified. Mild atrophic changes. Sinus disease as described. CT of the cervical spine: Multilevel degenerative changes similar to that seen on recent MRI from earlier in the same day. No acute fracture is noted. Aberrant right subclavian artery. The venous malformation suggested on recent MRI is felt to represent the confluence of the  right jugular and subclavian veins incompletely imaged on the prior exam. Electronically Signed   By: Inez Catalina M.D.   On: 03/05/2018 14:46   Ct Cervical Spine Wo Contrast  Result Date: 03/05/2018 CLINICAL DATA:  Recent fall with headaches and neck pain, initial encounter EXAM: CT HEAD WITHOUT CONTRAST CT CERVICAL SPINE WITHOUT CONTRAST TECHNIQUE: Multidetector CT imaging of the head and cervical spine was performed following the standard protocol without intravenous contrast. Multiplanar CT image reconstructions of the cervical spine were also generated. COMPARISON:  09/20/2006 CT of the head, 02/19/2018 FINDINGS: CT HEAD FINDINGS Brain: Mild atrophic changes are noted. A small focus of increased attenuation is noted along the falx re-re- just to the right of the midline. This is best seen on image number 24 of series 4 and on the coronal reconstructions on image number 26 of series 6 and on the sagittal reconstructions image 34 of series 7. No other areas of hemorrhage are identified. No findings to suggest infarction or space-occupying mass lesion are seen. Vascular: No hyperdense vessel or unexpected calcification. Skull: Normal. Negative for fracture or focal lesion. Sinuses/Orbits: Sinuses demonstrates some mild mucosal changes in the ethmoid sinuses as well as mucosal retention cysts in the maxillary antra bilaterally. Other: None CT CERVICAL SPINE FINDINGS Alignment: Alignment is well maintained in the cervical spine. Some anterolisthesis of C7 on T1 of a degenerative nature is noted stable from the previous exam. Skull base and vertebrae: 7 cervical segments are well visualized. Vertebral body height is well maintained. Multilevel disc space narrowing with associated osteophytic change is identified. Facet hypertrophic changes are noted throughout the cervical spine. The odontoid is within normal limits. No acute fracture or acute facet abnormality is identified. Soft tissues and spinal canal:  Chronic calcifications are noted. No other soft tissue abnormality is seen. The vascular changes on recent MRI examination likely represents volume averaging through the junction of the right jugular and right subclavian vein. No definitive vascular mass is noted although contrast was not administered for this exam. Upper chest: There is an aberrant right subclavian artery identified. The remainder of the upper chest is within normal limits. Other: None IMPRESSION: CT of the head: Small focus of increased attenuation along the falx review to the right of the midline as described above. This likely represents a small focus of contusion. No other significant hemorrhage is identified. Mild atrophic changes. Sinus disease as described. CT of the cervical spine: Multilevel degenerative changes similar to that seen on recent MRI from earlier in the same day. No acute fracture is noted. Aberrant right subclavian artery. 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 Electronically Signed  By: Inez Catalina M.D.   On: 03/05/2018 14:46   Mr Cervical Spine W/o Contrast  Result Date: 03/05/2018 CLINICAL DATA:  77 year old male with right side neck and shoulder pain for 3 weeks with no known injury. EXAM: MRI CERVICAL SPINE WITHOUT CONTRAST TECHNIQUE: Multiplanar, multisequence MR imaging of the cervical spine was performed. No intravenous contrast was administered. COMPARISON:  Cervical spine radiographs 02/19/2018. FINDINGS: Alignment: Stable from the recent radiographs. Moderate degenerative appearing anterolisthesis of C7 on T1. Mild degenerative anterolisthesis of C4 on C5. Mild superimposed straightening of cervical lordosis. Vertebrae: Visualized bone marrow signal is within normal limits. Intermittent degenerative endplate marrow signal changes, maximal at C6-C7 and C7-T1. No marrow edema or evidence  of acute osseous abnormality. Cord: No definite abnormal spinal cord signal despite stenosis with spinal cord mass effect at C3-C4, details below. The visible upper thoracic spinal cord is within normal limits. Posterior Fossa, vertebral arteries, paraspinal tissues: Cervicomedullary junction is within normal limits. Negative visible brain parenchyma. Dominant and mildly dolichoectatic left vertebral artery, the right is diminutive. The major vascular flow voids in the neck are preserved. There is lobular asymmetric and indeterminate appearing signal abnormality at the right thoracic inlet lateral to the right thyroid seen on series 6, image 30. This is not included on the sagittal images. Etiology is unclear, but these seem to be dilated venous vascular structures. No significant regional mass effect. Disc levels: C2-C3: Mild to moderate facet hypertrophy greater on the right. Mild broad-based posterior disc bulging. Mild ligament flavum hypertrophy. Mild spinal stenosis. No definite spinal cord mass effect. Moderate bilateral C3 foraminal stenosis. C3-C4: Mild left but moderate to severe right side facet hypertrophy with severe ligament flavum hypertrophy. Superimposed circumferential disc bulging and endplate spurring with a broad-based posterior component. Spinal stenosis with mild to moderate spinal cord mass effect. Moderate to severe left and severe right C4 foraminal stenosis. C4-C5: Moderate facet and ligament flavum hypertrophy greater on the right. Superimposed circumferential disc bulge and endplate spurring with a small broad-based central disc protrusion. Mild spinal stenosis with no cord mass effect. Mild to moderate left and moderate to severe right C5 foraminal stenosis. C5-C6: Disc space loss with bulky but mostly anterior and foraminal circumferential disc osteophyte complex. Mild facet hypertrophy. No significant spinal stenosis. Moderate to severe bilateral C6 foraminal stenosis. C6-C7: Disc  space loss with circumferential disc osteophyte complex eccentric to the left. Mild ligament flavum hypertrophy. No significant spinal stenosis. Severe left and moderate to severe right C7 foraminal stenosis. C7-T1: Anterolisthesis with near complete disc space loss. Moderate to severe facet and ligament flavum hypertrophy bilaterally. No significant spinal stenosis, but moderate to severe bilateral C8 foraminal stenosis. No upper thoracic spinal stenosis. IMPRESSION: 1. Suggestion of a venous vascular malformation at the right thoracic inlet (series 6, image 30). Ordinarily clinical significance would be doubtful, but in the setting of right shoulder pain recommend further evaluation. Chest CT with IV contrast may be the simplest way to evaluate further. Alternative imaging modalities include right brachial plexus MRI (without and with contrast), right shoulder MRI (larger than standard field-of-view, without and with contrast). 2. Advanced cervical spine degeneration with multilevel spondylolisthesis. No acute osseous abnormality identified. 3. Multifactorial spinal stenosis with up to moderate spinal cord mass effect at C3-C4, but no cord signal abnormality. Mild spinal stenosis at C2-C3, C4-C5. 4. Multifactorial moderate and severe cervical neural foraminal stenosis at all levels. Electronically Signed   By: Genevie Ann M.D.   On: 03/05/2018 09:39  Xr Cervical Spine 2 Or 3 Views  Result Date: 02/19/2018 Of the cervical spine obtained in 2 projections.  There is at least a 30% anterior listhesis of C7 on T1.  Joint space is completely obliterated.  There are degenerative changes at C5-6 and C6-7 without listhesis.  Fused facet arthropathy from C4 7   All questions were answered. The patient knows to call the clinic with any problems, questions or concerns. No barriers to learning was detected.  I spent 25 minutes counseling the patient face to face. The total time spent in the appointment was 30 minutes and  more than 50% was on counseling and review of test results  [name], MD 03/15/2018 10:47 AM

## 2018-03-15 NOTE — Assessment & Plan Note (Signed)
He had extensive evaluation for iron deficiency anemia, responded well with oral iron supplement He will continue iron supplement indefinitely

## 2018-03-15 NOTE — Assessment & Plan Note (Signed)
I warned him about risk of hyperglycemia with prednisone therapy He is advised to monitor his blood sugar carefully

## 2018-03-15 NOTE — Assessment & Plan Note (Signed)
He has intermittent left upper quadrant pain secondary to splenic enlargement Over-the-counter analgesics does not help I recommend low-dose prednisone therapy and he agreed to try I warned him about risk of hyperglycemia, high blood pressure, fluid retention and insomnia with prednisone

## 2018-03-20 ENCOUNTER — Ambulatory Visit (INDEPENDENT_AMBULATORY_CARE_PROVIDER_SITE_OTHER): Payer: Medicare Other | Admitting: Family Medicine

## 2018-03-20 ENCOUNTER — Encounter: Payer: Self-pay | Admitting: Family Medicine

## 2018-03-20 VITALS — BP 136/77 | HR 52 | Temp 97.5°F | Resp 20 | Ht 72.0 in | Wt 208.0 lb

## 2018-03-20 DIAGNOSIS — H6122 Impacted cerumen, left ear: Secondary | ICD-10-CM | POA: Diagnosis not present

## 2018-03-20 DIAGNOSIS — C8307 Small cell B-cell lymphoma, spleen: Secondary | ICD-10-CM

## 2018-03-20 DIAGNOSIS — D696 Thrombocytopenia, unspecified: Secondary | ICD-10-CM

## 2018-03-20 DIAGNOSIS — Z0000 Encounter for general adult medical examination without abnormal findings: Secondary | ICD-10-CM

## 2018-03-20 DIAGNOSIS — S066X0A Traumatic subarachnoid hemorrhage without loss of consciousness, initial encounter: Secondary | ICD-10-CM

## 2018-03-20 DIAGNOSIS — H9193 Unspecified hearing loss, bilateral: Secondary | ICD-10-CM

## 2018-03-20 DIAGNOSIS — N400 Enlarged prostate without lower urinary tract symptoms: Secondary | ICD-10-CM | POA: Diagnosis not present

## 2018-03-20 DIAGNOSIS — Z860100 Personal history of colon polyps, unspecified: Secondary | ICD-10-CM

## 2018-03-20 NOTE — Progress Notes (Signed)
Patient ID: Marcus Taylor, male  DOB: 03/19/1941, 77 y.o.   MRN: [id number] Patient Care Team    Relationship Specialty Notifications Start End  [name], DO PCP - General Family Medicine  01/26/16   [name], MD (Inactive) Consulting Physician Gastroenterology  01/26/16   [name], MD Consulting Physician Hematology and Oncology  01/26/16   [name], MD Referring Physician Cardiology  01/26/16   [name], MD Consulting Physician Neurosurgery  01/27/16   [name], OD Referring Physician   05/03/16    Comment: opthalmology     Chief Complaint  Patient presents with  . Annual Exam    Subjective:  Marcus Taylor is a 77 y.o.  Male  present for CPE. All past medical history, surgical history, allergies, family history, immunizations, medications and social history were updated in the electronic medical record today. All recent labs, ED visits and hospitalizations within the last year were reviewed.  Decreased hearing: Pt states he has noticed decrease hearing over the last few weeks. Feels "muffled" sound. No fever, chills or ringing in the ears.   Health maintenance:  Colonoscopy: last screen 04/18/2017, recommend follow up 3 year. Completed by Dr. Havery Moros.  Immunizations:  tdap UTD 2015, influenza UTD 2018 (encouraged yearly), PNA series completed. Lab Results  Component Value Date   PSA 1.85 03/12/2018  Assistive device: None Oxygen use: None Patient has a Dental home. Hospitalizations/ED visits: None  Assistive device: None Oxygen use: none Patient has a Dental home. Hospitalizations/ED visits: reviewed  Depression screen [facility] 2/9 03/20/2018 03/12/2018 01/17/2018 03/19/2017 01/24/2016  Decreased Interest 0 0 0 0 0  Down, Depressed, Hopeless 0 0 0 0 -  PHQ - 2 Score 0 0 0 0 0   No flowsheet data found.  Current Exercise Habits: The patient does not participate in regular exercise at present Exercise limited by: None  identified  Immunization History  Administered Date(s) Administered  . Influenza Split 05/07/2012  . Influenza, High Dose Seasonal PF 05/03/2016, 07/02/2017  . Influenza,inj,Quad PF,6+ Mos 09/22/2013, 05/25/2014, 05/31/2015  . Pneumococcal Conjugate-13 09/28/2014  . Pneumococcal Polysaccharide-23 10/05/2011  . Tdap 03/24/2014   Past Medical History:  Diagnosis Date  . Anemia   . Arthritis   . Colonic polyp   . Concussion 03/05/2018  . Coronary artery disease    stents x 3 (03-2014)  . Diabetes mellitus   . Diverticulosis    throughout entire colon  . HTN (hypertension)   . Hypertriglyceridemia   . Kidney stone   . Lymphocytosis   . Metatarsal fracture left   left 5th proximal phalanx   . OSA (obstructive sleep apnea)    "Mild" by records  . PONV (postoperative nausea and vomiting)   . RBBB 01/2016  . Splenic marginal zone b-cell lymphoma ([place]) 12/18/2011  . Splenomegaly   . STEMI (ST elevation myocardial infarction) [facility])    August 2015  . Subarachnoid hemorrhage ([place]) 03/05/2018   No Known Allergies Past Surgical History:  Procedure Laterality Date  . CARDIAC CATHETERIZATION     stents x 3 (03/2014)  . CHOLECYSTECTOMY    . HERNIA REPAIR    . INGUINAL HERNIA REPAIR Right 05/27/2015   Procedure: LAPAROSCOPIC REPAIR RIGHT  INGUINAL HERNIA;  Surgeon: Greer Pickerel, MD;  Location: [facility];  Service: General;  Laterality: Right;  . INSERTION OF MESH Right 05/27/2015   Procedure: INSERTION OF MESH;  Surgeon: Greer Pickerel, MD;  Location: [facility];  Service:  General;  Laterality: Right;  . LUMBAR DISC SURGERY    . TONSILLECTOMY AND ADENOIDECTOMY     Family History  Problem Relation Age of Onset  . Kidney disease Sister   . Colon cancer Sister 61  . Colon cancer Brother   . Arthritis Mother   . Breast cancer Mother   . Heart disease Mother   . Bladder Cancer Brother   . Arthritis Son   . Esophageal cancer Sister   . Rectal cancer Neg Hx   . Stomach cancer Neg Hx     Social History   Socioeconomic History  . Marital status: Married    Spouse name: Not on file  . Number of children: 2  . Years of education: Not on file  . 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 Financial resource strain: Not on file  . Food insecurity:    Worry: Not on file    Inability: Not on file  . Transportation needs:    Medical: Not on file    Non-medical: Not on file  Tobacco Use  . Smoking status: Former Smoker    Packs/day: 1.00    Years: 20.00    Pack years: 20.00    Last attempt to quit: 08/14/1988    Years since quitting: 29.6  . Smokeless tobacco: Never Used  Substance and Sexual Activity  . Alcohol use: No  . Drug use: No  . Sexual activity: Yes    Birth control/protection: None  Lifestyle  . Physical activity:    Days per week: Not on file    Minutes per session: Not on file  . Stress: Not on file  Relationships  . Social connections:    Talks on phone: Not on file    Gets together: Not on file    Attends religious service: Not on file    Active member of club or organization: Not on file    Attends meetings of clubs or organizations: Not on file    Relationship status: Not on file  . Intimate partner violence:    Fear of current or ex partner: Not on file    Emotionally abused: Not on file    Physically abused: Not on file    Forced sexual activity: Not on file  Other Topics Concern  . Not on file  Social History Narrative   Married to [place].    HS education. Retired.    Former smoker. No drugs or ETOH.   Wears seatbelt.   Wears dentures.   Smoke detector in the home. Firearms  In the home.    Feels safe in his relationships.    Allergies as of 03/20/2018   No Known Allergies     Medication List        Accurate as of 03/20/18  9:16 AM. Always use your most recent med list.          aspirin EC 81 MG tablet Take 81 mg by mouth  daily.   atorvastatin 40 MG tablet Commonly known as:  LIPITOR Take 1 tablet (40 mg total) by mouth daily.   clopidogrel 75 MG tablet Commonly known as:  PLAVIX Take 75 mg by mouth daily.   ferrous sulfate 325 (65 FE) MG tablet Take 325 mg by mouth daily with breakfast.   lisinopril 20 MG tablet Commonly known as:  PRINIVIL,ZESTRIL Take 1 tablet (20 mg  total) by mouth daily.   metFORMIN 500 MG 24 hr tablet Commonly known as:  GLUCOPHAGE-XR Take 1 tablet (500 mg total) by mouth daily with breakfast. TAKE 1 TABLET BY MOUTH WITH EVENING MEAL M-W-F   metoprolol tartrate 25 MG tablet Commonly known as:  LOPRESSOR Take 0.5 tablets (12.5 mg total) by mouth 2 (two) times daily.   nitroGLYCERIN 0.4 MG SL tablet Commonly known as:  NITROSTAT Place 1 tablet (0.4 mg total) under the tongue every 5 (five) minutes as needed for chest pain.   predniSONE 20 MG tablet Commonly known as:  DELTASONE Take 1 tablet (20 mg total) by mouth daily with breakfast.       All past medical history, surgical history, allergies, family history, immunizations andmedications were updated in the EMR today and reviewed under the history and medication portions of their EMR.      ROS: 14 pt review of systems performed and negative (unless mentioned in an HPI)  Objective: BP 136/77 (BP Location: Right Arm, Patient Position: Sitting, Cuff Size: Normal)   Pulse (!) 52   Temp (!) 97.5 F (36.4 C)   Resp 20   Ht 6' (1.829 m)   Wt 208 lb (94.3 kg)   SpO2 97%   BMI 28.21 kg/m  Gen: Afebrile. No acute distress. Nontoxic in appearance, well-developed, well-nourished,  Pleasant caucasian male.  HENT: AT. [place]. Right TM visualized and normal in appearance, left TM unable to visualize 2/2 to cerumen.MMM, no oral lesions, adequate dentition. Bilateral nares within normal limits. Throat without erythema, ulcerations or exudates. no Cough on exam, no hoarseness on exam. Eyes:Pupils Equal Round Reactive to light,  Extraocular movements intact,  Conjunctiva without redness, discharge or icterus. Neck/lymp/endocrine: Supple,no lymphadenopathy, no thyromegaly CV: RRR no murmur, no edema, +2/4 P posterior tibialis pulses. no carotid bruits. No JVD. Chest: CTAB, no wheeze, rhonchi or crackles. normal Respiratory effort. good Air movement. Abd: Soft. round. NTND. BS present. Splenomegaly present. No rebound tenderness or guarding. Skin: no rashes, purpura or petechiae. Warm and well-perfused. Skin intact. Neuro/Msk:  Normal gait. PERLA. EOMi. Alert. Oriented x3.  Cranial nerves II through XII intact. Muscle strength 5/5 upper/lower extremity. DTRs equal bilaterally. Psych: Normal affect, dress and demeanor. Normal speech. Normal thought content and judgment.   No exam data present  Assessment/plan: Marcus Taylor is a 77 y.o. male present for CPE. Enlarged prostate without lower urinary tract symptoms (luts) PSA normal 2019. No further testing needed unless symptomatic  History of colon polyps - another surveillance colonoscopy recommended by GI in 3 years (Due 2021)  Thrombocytopenia (HCC)/Splenic marginal zone b-cell lymphoma (HCC) - following w/ hem/onc. Recent appt to discuss tx options given enlarged spleen causing discomfort. Pt reports he is ok as long as not laying flat. Sleeping in lounge chair.  - currently on trial of steroids until end of Sept, if not helpful is considering surgery vs chemo.  - if decides surgical -- would need to consider need of immunizations   Subarachnoid hematoma, without loss of consciousness, initial encounter ([place]) - resolved. Headaches resolved.  Impacted cerumen of left ear/Decreased hearing of both ears - left ear irrigated today. - hearing improved with cerumen removal. TM intact.  Encounter for preventive health exam:  Patient was encouraged to exercise greater than 150 minutes a week. Patient was encouraged to choose a diet filled with fresh fruits and  vegetables, and lean meats. AVS provided to patient today for education/recommendation on gender specific health and safety maintenance. Colonoscopy:  last screen 04/18/2017, recommend follow up 3 year. Completed by Dr. Havery Moros.  Immunizations:  tdap UTD 2015, influenza UTD 2018 (encouraged yearly), PNA series completed. All labs are UTD over the last 3 months. Reviewed.   Return in about 1 year (around 03/21/2019) for CPE.  [place] q 6  Electronically signed by: Howard Pouch, DO [place]

## 2018-03-20 NOTE — Patient Instructions (Addendum)
Make a nurse visit to get flu shot, if desired in about 4-6 weeks. Call ahead to see if we have it.    Health Maintenance, Male A healthy lifestyle and preventive care is important for your health and wellness. Ask your health care provider about what schedule of regular examinations is right for you. What should I know about weight and diet? Eat a Healthy Diet  Eat plenty of vegetables, fruits, whole grains, low-fat dairy products, and lean protein.  Do not eat a lot of foods high in solid fats, added sugars, or salt.  Maintain a Healthy Weight Regular exercise can help you achieve or maintain a healthy weight. You should:  Do at least 150 minutes of exercise each week. The exercise should increase your heart rate and make you sweat (moderate-intensity exercise).  Do strength-training exercises at least twice a week.  Watch Your Levels of Cholesterol and Blood Lipids  Have your blood tested for lipids and cholesterol every 5 years starting at 77 years of age. If you are at high risk for heart disease, you should start having your blood tested when you are 77 years old. You may need to have your cholesterol levels checked more often if: ? Your lipid or cholesterol levels are high. ? You are older than 77 years of age. ? You are at high risk for heart disease.  What should I know about cancer screening? Many types of cancers can be detected early and may often be prevented. Lung Cancer  You should be screened every year for lung cancer if: ? You are a current smoker who has smoked for at least 30 years. ? You are a former smoker who has quit within the past 15 years.  Talk to your health care provider about your screening options, when you should start screening, and how often you should be screened.  Colorectal Cancer  Routine colorectal cancer screening usually begins at 77 years of age and should be repeated every 5-10 years until you are 77 years old. You may need to be  screened more often if early forms of precancerous polyps or small growths are found. Your health care provider may recommend screening at an earlier age if you have risk factors for colon cancer.  Your health care provider may recommend using home test kits to check for hidden blood in the stool.  A small camera at the end of a tube can be used to examine your colon (sigmoidoscopy or colonoscopy). This checks for the earliest forms of colorectal cancer.  Prostate and Testicular Cancer  Depending on your age and overall health, your health care provider may do certain tests to screen for prostate and testicular cancer.  Talk to your health care provider about any symptoms or concerns you have about testicular or prostate cancer.  Skin Cancer  Check your skin from head to toe regularly.  Tell your health care provider about any new moles or changes in moles, especially if: ? There is a change in a mole's size, shape, or color. ? You have a mole that is larger than a pencil eraser.  Always use sunscreen. Apply sunscreen liberally and repeat throughout the day.  Protect yourself by wearing long sleeves, pants, a wide-brimmed hat, and sunglasses when outside.  What should I know about heart disease, diabetes, and high blood pressure?  If you are 67-54 years of age, have your blood pressure checked every 3-5 years. If you are 44 years of age or older, have  your blood pressure checked every year. You should have your blood pressure measured twice-once when you are at a hospital or clinic, and once when you are not at a hospital or clinic. Record the average of the two measurements. To check your blood pressure when you are not at a hospital or clinic, you can use: ? An automated blood pressure machine at a pharmacy. ? A home blood pressure monitor.  Talk to your health care provider about your target blood pressure.  If you are between 36-62 years old, ask your health care provider if you  should take aspirin to prevent heart disease.  Have regular diabetes screenings by checking your fasting blood sugar level. ? If you are at a normal weight and have a low risk for diabetes, have this test once every three years after the age of 31. ? If you are overweight and have a high risk for diabetes, consider being tested at a younger age or more often.  A one-time screening for abdominal aortic aneurysm (AAA) by ultrasound is recommended for men aged 2-75 years who are current or former smokers. What should I know about preventing infection? Hepatitis B If you have a higher risk for hepatitis B, you should be screened for this virus. Talk with your health care provider to find out if you are at risk for hepatitis B infection. Hepatitis C Blood testing is recommended for:  Everyone born from [birth year] through 1965.  Anyone with known risk factors for hepatitis C.  Sexually Transmitted Diseases (STDs)  You should be screened each year for STDs including gonorrhea and chlamydia if: ? You are sexually active and are younger than 77 years of age. ? You are older than 77 years of age and your health care provider tells you that you are at risk for this type of infection. ? Your sexual activity has changed since you were last screened and you are at an increased risk for chlamydia or gonorrhea. Ask your health care provider if you are at risk.  Talk with your health care provider about whether you are at high risk of being infected with HIV. Your health care provider may recommend a prescription medicine to help prevent HIV infection.  What else can I do?  Schedule regular health, dental, and eye exams.  Stay current with your vaccines (immunizations).  Do not use any tobacco products, such as cigarettes, chewing tobacco, and e-cigarettes. If you need help quitting, ask your health care provider.  Limit alcohol intake to no more than 2 drinks per day. One drink equals 12 ounces of beer,  5 ounces of wine, or 1 ounces of hard liquor.  Do not use street drugs.  Do not share needles.  Ask your health care provider for help if you need support or information about quitting drugs.  Tell your health care provider if you often feel depressed.  Tell your health care provider if you have ever been abused or do not feel safe at home. This information is not intended to replace advice given to you by your health care provider. Make sure you discuss any questions you have with your health care provider. Document Released: 01/27/2008 Document Revised: 03/29/2016 Document Reviewed: 05/04/2015 Elsevier Interactive Patient Education  [employer].

## 2018-03-20 NOTE — Progress Notes (Signed)
Subjective: 77 y.o. returns the office today for painful, elongated, thickened toenails which he cannot trim himself. Denies any redness or drainage around the nails. Denies any acute changes since last appointment and no new complaints today. Denies any systemic complaints such as fevers, chills, nausea, vomiting.   He is on Plavix.   Objective: AAO 3, NAD DP/PT pulses palpable, CRT less than 3 seconds Nails hypertrophic, dystrophic, elongated, brittle, discolored 10. There is tenderness overlying the nails 1-5 bilaterally. There is no surrounding erythema or drainage along the nail sites. Small callus spot on lateral right hallux site. No underlying ulceration, drainage or signs of infection.  Hammertoes are present.  No open lesions or pre-ulcerative lesions are identified. No pain with calf compression, swelling, warmth, erythema.  Assessment: 77 year old male presents with symptomatic onychomycosis  Plan: -Treatment options including alternatives, risks, complications were discussed -Nails sharply debrided 10 without complication/bleeding. -Discussed daily foot inspection. If there are any changes, to call the office immediately.  -Follow-up in 9 weeks for nail trim or sooner if any problems are to arise. In the meantime, encouraged to call the office with any questions, concerns, changes symptoms.  Celesta Gentile, DPM

## 2018-04-01 ENCOUNTER — Other Ambulatory Visit: Payer: Self-pay

## 2018-04-01 MED ORDER — METFORMIN HCL ER 500 MG PO TB24: 500.0000 mg | ORAL_TABLET | Freq: Every day | ORAL | 1 refills | Status: DC

## 2018-04-11 ENCOUNTER — Other Ambulatory Visit: Payer: Self-pay | Admitting: Hematology and Oncology

## 2018-04-18 ENCOUNTER — Ambulatory Visit (INDEPENDENT_AMBULATORY_CARE_PROVIDER_SITE_OTHER): Payer: Medicare Other | Admitting: Orthopaedic Surgery

## 2018-04-18 ENCOUNTER — Inpatient Hospital Stay: Payer: Medicare Other | Attending: Hematology and Oncology

## 2018-04-18 ENCOUNTER — Other Ambulatory Visit: Payer: Self-pay | Admitting: Hematology and Oncology

## 2018-04-18 ENCOUNTER — Telehealth: Payer: Self-pay | Admitting: Hematology and Oncology

## 2018-04-18 ENCOUNTER — Inpatient Hospital Stay (HOSPITAL_BASED_OUTPATIENT_CLINIC_OR_DEPARTMENT_OTHER): Payer: Medicare Other | Admitting: Hematology and Oncology

## 2018-04-18 DIAGNOSIS — C8307 Small cell B-cell lymphoma, spleen: Secondary | ICD-10-CM | POA: Diagnosis not present

## 2018-04-18 DIAGNOSIS — D61818 Other pancytopenia: Secondary | ICD-10-CM | POA: Diagnosis not present

## 2018-04-18 DIAGNOSIS — E119 Type 2 diabetes mellitus without complications: Secondary | ICD-10-CM | POA: Diagnosis not present

## 2018-04-18 DIAGNOSIS — R238 Other skin changes: Secondary | ICD-10-CM

## 2018-04-18 DIAGNOSIS — E118 Type 2 diabetes mellitus with unspecified complications: Secondary | ICD-10-CM

## 2018-04-18 DIAGNOSIS — R233 Spontaneous ecchymoses: Secondary | ICD-10-CM

## 2018-04-18 LAB — COMPREHENSIVE METABOLIC PANEL
ALBUMIN: 3.7 g/dL (ref 3.5–5.0)
ALT: 23 U/L (ref 0–44)
AST: 27 U/L (ref 15–41)
Alkaline Phosphatase: 63 U/L (ref 38–126)
Anion gap: 7 (ref 5–15)
BILIRUBIN TOTAL: 0.4 mg/dL (ref 0.3–1.2)
BUN: 16 mg/dL (ref 8–23)
CHLORIDE: 107 mmol/L (ref 98–111)
CO2: 27 mmol/L (ref 22–32)
Calcium: 9.3 mg/dL (ref 8.9–10.3)
Creatinine, Ser: 0.83 mg/dL (ref 0.61–1.24)
GFR calc Af Amer: >60 mL/min (ref >60)
GFR calc non Af Amer: >60 mL/min (ref >60)
GLUCOSE: 109 mg/dL — AB (ref 70–99)
POTASSIUM: 4.3 mmol/L (ref 3.5–5.1)
Sodium: 141 mmol/L (ref 135–145)
TOTAL PROTEIN: 5.9 g/dL — AB (ref 6.5–8.1)

## 2018-04-18 LAB — CBC WITH DIFFERENTIAL/PLATELET
BASOS PCT: 1 %
Basophils Absolute: 0.1 10*3/uL (ref 0.0–0.1)
Eosinophils Absolute: 0.1 10*3/uL (ref 0.0–0.5)
Eosinophils Relative: 1 %
HEMATOCRIT: 38.8 % (ref 38.4–49.9)
HEMOGLOBIN: 12.5 g/dL — AB (ref 13.0–17.1)
Lymphocytes Relative: 76 %
Lymphs Abs: 10.6 10*3/uL — ABNORMAL HIGH (ref 0.9–3.3)
MCH: 28.2 pg (ref 27.2–33.4)
MCHC: 32.2 g/dL (ref 32.0–36.0)
MCV: 87.6 fL (ref 79.3–98.0)
MONO ABS: 0.5 10*3/uL (ref 0.1–0.9)
Monocytes Relative: 4 %
NEUTROS ABS: 2.5 10*3/uL (ref 1.5–6.5)
NEUTROS PCT: 18 %
Platelets: 130 10*3/uL — ABNORMAL LOW (ref 140–400)
RBC: 4.43 MIL/uL (ref 4.20–5.82)
RDW: 17.5 % — AB (ref 11.0–14.6)
WBC: 13.8 10*3/uL — AB (ref 4.0–10.3)

## 2018-04-18 LAB — LACTATE DEHYDROGENASE: LDH: 144 U/L (ref 98–192)

## 2018-04-18 NOTE — Telephone Encounter (Signed)
Gave patient avs and calendar.   °

## 2018-04-19 ENCOUNTER — Encounter: Payer: Self-pay | Admitting: Hematology and Oncology

## 2018-04-19 ENCOUNTER — Ambulatory Visit (INDEPENDENT_AMBULATORY_CARE_PROVIDER_SITE_OTHER): Payer: Medicare Other | Admitting: Orthopaedic Surgery

## 2018-04-19 ENCOUNTER — Encounter (INDEPENDENT_AMBULATORY_CARE_PROVIDER_SITE_OTHER): Payer: Self-pay | Admitting: Orthopaedic Surgery

## 2018-04-19 VITALS — BP 143/77 | HR 47 | Ht 72.0 in | Wt 211.0 lb

## 2018-04-19 DIAGNOSIS — D61818 Other pancytopenia: Secondary | ICD-10-CM | POA: Insufficient documentation

## 2018-04-19 DIAGNOSIS — M1711 Unilateral primary osteoarthritis, right knee: Secondary | ICD-10-CM | POA: Diagnosis not present

## 2018-04-19 DIAGNOSIS — R238 Other skin changes: Secondary | ICD-10-CM | POA: Insufficient documentation

## 2018-04-19 DIAGNOSIS — R233 Spontaneous ecchymoses: Secondary | ICD-10-CM | POA: Insufficient documentation

## 2018-04-19 MED ORDER — METHYLPREDNISOLONE ACETATE 40 MG/ML IJ SUSP
80.0000 mg | INTRAMUSCULAR | Status: AC | PRN
  Administered 2018-04-19: 80 mg

## 2018-04-19 MED ORDER — LIDOCAINE HCL 1 % IJ SOLN
2.0000 mL | INTRAMUSCULAR | Status: AC | PRN
  Administered 2018-04-19: 2 mL

## 2018-04-19 MED ORDER — BUPIVACAINE HCL 0.5 % IJ SOLN
2.0000 mL | INTRAMUSCULAR | Status: AC | PRN
  Administered 2018-04-19: 2 mL via INTRA_ARTICULAR

## 2018-04-19 NOTE — Progress Notes (Signed)
[place] OFFICE PROGRESS NOTE  Patient Care Team: [name], DO as PCP - General (Family Medicine) [name], MD (Inactive) as Consulting Physician (Gastroenterology) [name], MD as Consulting Physician (Hematology and Oncology) [name], MD as Referring Physician (Cardiology) [name], MD as Consulting Physician (Neurosurgery) [name], OD as Referring Physician  ASSESSMENT & PLAN:  Splenic marginal zone b-cell lymphoma [facility]) We discussed the risk, benefits, side effects of chemotherapy versus surgery for management of his disease From a prior visit, he was in agreement to take 20 mg of prednisone daily He has small improvement of his symptoms with less back pain and improvement of arthritis control Absolute lymphocyte count has improved I recommend reducing prednisone to 10 mg daily and reassess next month  Pancytopenia, acquired ([place]) He has multifactorial acquired pancytopenia, could be related to sequestration of platelet from splenomegaly and possibly disease in his bone marrow He has detectable circulating lymphocyte count which has improved since we started him on prednisone He is not symptomatic from anemia, which could very well be anemia chronic illness I will observe only for now  Diabetes mellitus type 2, controlled, with complications ([place]) He denies worsening hyperglycemia while on low-dose prednisone I plan to reduce it to 10 mg daily  Easy bruising He is noted to have easy bruising but I do not think is related to his prednisone therapy It is likely related to his antiplatelet agents Hopefully, with reducing dose of prednisone, he has less bruising   No orders of the defined types were placed in this encounter.   INTERVAL HISTORY: Please see below for problem oriented charting. He is seen for follow-up since I started him on 20 mg of prednisone last month He feels well He felt that his lower back pain/left  upper quadrant pain has improved He has excellent energy level. The prednisone also help with his arthritis pain He denies significant hyperglycemia He has chronic bruising but stable The patient denies any recent signs or symptoms of bleeding such as spontaneous epistaxis, hematuria or hematochezia.   SUMMARY OF ONCOLOGIC HISTORY:  This is a very pleasant gentleman who was found to have abnormal CBC for the past 5 years. In April 2013, flow cytometry detectable monoclonal B-cell population suspicious for splenic/marginal zone lymphoma. Ultrasound confirmed splenomegaly. Due to lack of symptoms he was being observed. In October 2017, he was noted to have microcytic, iron deficiency anemia. He was started on oral iron supplement On 03/05/2018, the patient had an accidental fall.  CT imaging revealed significant splenomegaly On 03/14/2018, he was started on 20 mg of prednisone daily  REVIEW OF SYSTEMS:   Constitutional: Denies fevers, chills or abnormal weight loss Eyes: Denies blurriness of vision Ears, nose, mouth, throat, and face: Denies mucositis or sore throat Respiratory: Denies cough, dyspnea or wheezes Cardiovascular: Denies palpitation, chest discomfort or lower extremity swelling Gastrointestinal:  Denies nausea, heartburn or change in bowel habits Skin: Denies abnormal skin rashes Lymphatics: Denies new lymphadenopathy Neurological:Denies numbness, tingling or new weaknesses Behavioral/Psych: Mood is stable, no new changes  All other systems were reviewed with the patient and are negative.  I have reviewed the past medical history, past surgical history, social history and family history with the patient and they are unchanged from previous note.  ALLERGIES:  has No Known Allergies.  MEDICATIONS:  Current Outpatient Medications  Medication Sig Dispense Refill  . aspirin EC 81 MG tablet Take 81 mg by mouth daily.    Marland Kitchen  atorvastatin (LIPITOR) 40 MG tablet Take 1 tablet (40 mg  total) by mouth daily. 90 tablet 3  . clopidogrel (PLAVIX) 75 MG tablet Take 75 mg by mouth daily.    . ferrous sulfate 325 (65 FE) MG tablet Take 325 mg by mouth daily with breakfast.    . lisinopril (PRINIVIL,ZESTRIL) 20 MG tablet Take 1 tablet (20 mg total) by mouth daily. 90 tablet 1  . metFORMIN (GLUCOPHAGE-XR) 500 MG 24 hr tablet Take 1 tablet (500 mg total) by mouth daily with breakfast. TAKE 1 TABLET BY MOUTH WITH EVENING MEAL M-W-F 45 tablet 1  . metoprolol tartrate (LOPRESSOR) 25 MG tablet Take 0.5 tablets (12.5 mg total) by mouth 2 (two) times daily. 90 tablet 1  . nitroGLYCERIN (NITROSTAT) 0.4 MG SL tablet Place 1 tablet (0.4 mg total) under the tongue every 5 (five) minutes as needed for chest pain. 10 tablet 0  . predniSONE (DELTASONE) 10 MG tablet TAKE 2 TABLETS BY MOUTH DAILY WITH BREAKFAST 60 tablet 0   Current Facility-Administered Medications  Medication Dose Route Frequency Provider Last Rate Last Dose  . 0.9 %  sodium chloride infusion  500 mL Intravenous Continuous Armbruster, Carlota Raspberry, MD        PHYSICAL EXAMINATION: ECOG PERFORMANCE STATUS: 1 - Symptomatic but completely ambulatory  Vitals:   04/18/18 1230  BP: (!) 133/58  Pulse: 65  Resp: 18  Temp: 97.9 F (36.6 C)  SpO2: 99%   Filed Weights   04/18/18 1230  Weight: 211 lb (95.7 kg)    GENERAL:alert, no distress and comfortable SKIN: skin color, texture, turgor are normal, no rashes or significant lesions.  Noted excessive skin bruises NEURO: alert & oriented x 3 with fluent speech, no focal motor/sensory deficits  LABORATORY DATA:  I have reviewed the data as listed    Component Value Date/Time   NA 141 04/18/2018 1205   NA 144 05/31/2015 1404   K 4.3 04/18/2018 1205   K 4.7 05/31/2015 1404   CL 107 04/18/2018 1205   CL 102 10/07/2012 1013   CO2 27 04/18/2018 1205   CO2 29 05/31/2015 1404   GLUCOSE 109 (H) 04/18/2018 1205   GLUCOSE 99 05/31/2015 1404   GLUCOSE 199 (H) 10/07/2012 1013   BUN  16 04/18/2018 1205   BUN 18.3 05/31/2015 1404   CREATININE 0.83 04/18/2018 1205   CREATININE 0.86 03/13/2017 0759   CREATININE 0.9 05/31/2015 1404   CALCIUM 9.3 04/18/2018 1205   CALCIUM 9.2 05/31/2015 1404   PROT 5.9 (L) 04/18/2018 1205   PROT 6.3 (L) 05/31/2015 1404   ALBUMIN 3.7 04/18/2018 1205   ALBUMIN 3.9 05/31/2015 1404   AST 27 04/18/2018 1205   AST 28 05/31/2015 1404   ALT 23 04/18/2018 1205   ALT 23 05/31/2015 1404   ALKPHOS 63 04/18/2018 1205   ALKPHOS 79 05/31/2015 1404   BILITOT 0.4 04/18/2018 1205   BILITOT 0.46 05/31/2015 1404   GFRNONAA >60 04/18/2018 1205   GFRNONAA 85 03/13/2017 0759   GFRAA >60 04/18/2018 1205   GFRAA >89 03/13/2017 0759    No results found for: SPEP, UPEP  Lab Results  Component Value Date   WBC 13.8 (H) 04/18/2018   NEUTROABS 2.5 04/18/2018   HGB 12.5 (L) 04/18/2018   HCT 38.8 04/18/2018   MCV 87.6 04/18/2018   PLT 130 (L) 04/18/2018      Chemistry      Component Value Date/Time   NA 141 04/18/2018 1205   NA 144 05/31/2015  1404   K 4.3 04/18/2018 1205   K 4.7 05/31/2015 1404   CL 107 04/18/2018 1205   CL 102 10/07/2012 1013   CO2 27 04/18/2018 1205   CO2 29 05/31/2015 1404   BUN 16 04/18/2018 1205   BUN 18.3 05/31/2015 1404   CREATININE 0.83 04/18/2018 1205   CREATININE 0.86 03/13/2017 0759   CREATININE 0.9 05/31/2015 1404      Component Value Date/Time   CALCIUM 9.3 04/18/2018 1205   CALCIUM 9.2 05/31/2015 1404   ALKPHOS 63 04/18/2018 1205   ALKPHOS 79 05/31/2015 1404   AST 27 04/18/2018 1205   AST 28 05/31/2015 1404   ALT 23 04/18/2018 1205   ALT 23 05/31/2015 1404   BILITOT 0.4 04/18/2018 1205   BILITOT 0.46 05/31/2015 1404       All questions were answered. The patient knows to call the clinic with any problems, questions or concerns. No barriers to learning was detected.  I spent 15 minutes counseling the patient face to face. The total time spent in the appointment was 20 minutes and more than 50% was  on counseling and review of test results  [name], MD 04/19/2018 7:21 AM

## 2018-04-19 NOTE — Assessment & Plan Note (Signed)
He has multifactorial acquired pancytopenia, could be related to sequestration of platelet from splenomegaly and possibly disease in his bone marrow He has detectable circulating lymphocyte count which has improved since we started him on prednisone He is not symptomatic from anemia, which could very well be anemia chronic illness I will observe only for now

## 2018-04-19 NOTE — Assessment & Plan Note (Signed)
We discussed the risk, benefits, side effects of chemotherapy versus surgery for management of his disease From a prior visit, he was in agreement to take 20 mg of prednisone daily He has small improvement of his symptoms with less back pain and improvement of arthritis control Absolute lymphocyte count has improved I recommend reducing prednisone to 10 mg daily and reassess next month

## 2018-04-19 NOTE — Progress Notes (Signed)
Office Visit Note   Patient: Marcus Taylor           Date of Birth: 01-07-1941           MRN: [id number] Visit Date: 04/19/2018              Requested by: [name], DO [address] [place], [place] [address] PCP: [name], DO   Assessment & Plan: Visit Diagnoses:  1. Unilateral primary osteoarthritis, right knee     Plan: Aspirate right knee and inject cortisone.  Long discussion regarding definitive treatment of total knee replacement and to see back as needed  Follow-Up Instructions: Return if symptoms worsen or fail to improve.   Orders:  Orders Placed This Encounter  Procedures  . Large Joint Inj: R knee   No orders of the defined types were placed in this encounter.     Procedures: Large Joint Inj: R knee on 04/19/2018 1:18 PM Indications: pain and diagnostic evaluation Details: 25 G 1.5 in needle, anteromedial approach  Arthrogram: No  Medications: 2 mL lidocaine 1 %; 2 mL bupivacaine 0.5 %; 80 mg methylPREDNISolone acetate 40 MG/ML Aspirate: 62 mL yellow and clear Procedure, treatment alternatives, risks and benefits explained, specific risks discussed. Consent was given by the patient. Immediately prior to procedure a time out was called to verify the correct patient, procedure, equipment, support staff and site/side marked as required. Patient was prepped and draped in the usual sterile fashion.       Clinical Data: No additional findings.   Subjective: Chief Complaint  Patient presents with  . Follow-up    R KNEE F/U 01/31/18 (HAD INJECTION AND FLUID DRAWN OFF KNEE) STILL HAVING SWELLING AND PAIN IN IT WOULD LIKE INJECTION    HPI  Review of Systems  Constitutional: Negative for fatigue and fever.  HENT: Negative for ear pain.   Eyes: Negative for pain.  Respiratory: Negative for cough and shortness of breath.   Cardiovascular: Positive for leg swelling.  Gastrointestinal: Negative for diarrhea and nausea.  Genitourinary: Negative  for difficulty urinating.  Musculoskeletal: Negative for back pain and neck pain.  Skin: Negative for rash.  Allergic/Immunologic: Negative for food allergies.  Neurological: Positive for weakness. Negative for numbness.  Hematological: Bruises/bleeds easily.  Psychiatric/Behavioral: Positive for sleep disturbance.     Objective: Vital Signs: BP (!) 143/77 (BP Location: Left Arm, Cuff Size: Normal)   Pulse (!) 47   Ht 6' (1.829 m)   Wt 211 lb (95.7 kg)   BMI 28.62 kg/m   Physical Exam  Constitutional: He is oriented to person, place, and time. He appears well-developed and well-nourished.  HENT:  Mouth/Throat: Oropharynx is clear and moist.  Eyes: Pupils are equal, round, and reactive to light. EOM are normal.  Pulmonary/Chest: Effort normal.  Neurological: He is alert and oriented to person, place, and time.  Skin: Skin is warm and dry.  Psychiatric: He has a normal mood and affect. His behavior is normal.    Ortho Exam right knee with positive effusion.  Slightly warm.  Mostly medial joint pain with some patellar crepitation.  No instability.  No erythema or ecchymosis.  No distal edema.  A little bit of fullness in the popliteal space but no pain.  Capillary refill to toes.  Straight leg raise negative.  Painless range of motion right hip  Specialty Comments:  No specialty comments available.  Imaging: No results found.   PMFS History: Patient Active Problem List  Diagnosis Date Noted  . Pancytopenia, acquired ([place]) 04/19/2018  . Easy bruising 04/19/2018  . Unilateral primary osteoarthritis, right knee 04/19/2018  . Decreased hearing of both ears 03/20/2018  . Left upper quadrant pain 03/15/2018  . Splenomegaly 03/12/2018  . Enlarged prostate without lower urinary tract symptoms (luts) 03/12/2018  . Subarachnoid hematoma ([place]) 03/12/2018  . Neck pain 02/19/2018  . IDA (iron deficiency anemia) 08/09/2016  . Thrombocytopenia ([place]) 05/30/2016  . Diabetes mellitus  type 2, controlled, with complications ([place]) 63/84/6659  . Right inguinal hernia 02/03/2016  . History of colon polyps 01/27/2016  . History of ST elevation myocardial infarction (STEMI) 01/27/2016  . CAD (coronary artery disease) 01/27/2016  . Hyperlipidemia 01/27/2016  . Anemia in neoplastic disease 05/25/2014  . Splenic marginal zone b-cell lymphoma ([place]) 12/18/2011  . HTN (hypertension)    Past Medical History:  Diagnosis Date  . Anemia   . Arthritis   . Colonic polyp   . Concussion 03/05/2018  . Coronary artery disease    stents x 3 (03-2014)  . Diabetes mellitus   . Diverticulosis    throughout entire colon  . HTN (hypertension)   . Hypertriglyceridemia   . Kidney stone   . Lymphocytosis   . Metatarsal fracture left   left 5th proximal phalanx   . OSA (obstructive sleep apnea)    "Mild" by records  . PONV (postoperative nausea and vomiting)   . RBBB 01/2016  . Splenic marginal zone b-cell lymphoma ([place]) 12/18/2011  . Splenomegaly   . STEMI (ST elevation myocardial infarction) [facility])    August 2015  . Subarachnoid hemorrhage ([place]) 03/05/2018    Family History  Problem Relation Age of Onset  . Kidney disease Sister   . Colon cancer Sister 78  . Colon cancer Brother   . Arthritis Mother   . Breast cancer Mother   . Heart disease Mother   . Bladder Cancer Brother   . Arthritis Son   . Esophageal cancer Sister   . Rectal cancer Neg Hx   . Stomach cancer Neg Hx     Past Surgical History:  Procedure Laterality Date  . CARDIAC CATHETERIZATION     stents x 3 (03/2014)  . CHOLECYSTECTOMY    . HERNIA REPAIR    . INGUINAL HERNIA REPAIR Right 05/27/2015   Procedure: LAPAROSCOPIC REPAIR RIGHT  INGUINAL HERNIA;  Surgeon: Greer Pickerel, MD;  Location: [facility];  Service: General;  Laterality: Right;  . INSERTION OF MESH Right 05/27/2015   Procedure: INSERTION OF MESH;  Surgeon: Greer Pickerel, MD;  Location: [facility];  Service: General;  Laterality: Right;  . LUMBAR DISC SURGERY     . 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 Smoking status: Former Smoker    Packs/day: 1.00    Years: 20.00    Pack years: 20.00    Last attempt to quit: 08/14/1988    Years since quitting: 29.6  . Smokeless tobacco: Never Used  Substance and Sexual Activity  . Alcohol use: No  . Drug use: No  . Sexual activity: Yes    Birth control/protection: None

## 2018-04-19 NOTE — Assessment & Plan Note (Signed)
He denies worsening hyperglycemia while on low-dose prednisone I plan to reduce it to 10 mg daily

## 2018-04-19 NOTE — Assessment & Plan Note (Signed)
He is noted to have easy bruising but I do not think is related to his prednisone therapy It is likely related to his antiplatelet agents Hopefully, with reducing dose of prednisone, he has less bruising

## 2018-05-11 ENCOUNTER — Other Ambulatory Visit: Payer: Self-pay | Admitting: Hematology and Oncology

## 2018-05-17 ENCOUNTER — Ambulatory Visit: Payer: Medicare Other | Admitting: Podiatry

## 2018-05-17 ENCOUNTER — Encounter: Payer: Self-pay | Admitting: Podiatry

## 2018-05-17 DIAGNOSIS — B351 Tinea unguium: Secondary | ICD-10-CM

## 2018-05-17 DIAGNOSIS — D689 Coagulation defect, unspecified: Secondary | ICD-10-CM

## 2018-05-17 DIAGNOSIS — M79676 Pain in unspecified toe(s): Secondary | ICD-10-CM

## 2018-05-17 NOTE — Progress Notes (Signed)
Subjective: 77 y.o. returns the office today for painful, elongated, thickened toenails which he cannot trim himself. Denies any redness or drainage around the nails. Denies any acute changes since last appointment and no new complaints today. Denies any systemic complaints such as fevers, chills, nausea, vomiting.   He is on Plavix.   Objective: AAO 3, NAD DP/PT pulses palpable, CRT less than 3 seconds Nails hypertrophic, dystrophic, elongated, brittle, discolored 10. There is tenderness overlying the nails 1-5 bilaterally. There is no surrounding erythema or drainage along the nail sites.  Hammertoes are present.  No open lesions or pre-ulcerative lesions are identified. No pain with calf compression, swelling, warmth, erythema. Otherwise no change  Assessment: 77 year old male presents with symptomatic onychomycosis  Plan: -Treatment options including alternatives, risks, complications were discussed -Nails sharply debrided 10 without complication/bleeding. -Discussed daily foot inspection. If there are any changes, to call the office immediately.  -Follow-up in 9 weeks for nail trim or sooner if any problems are to arise. In the meantime, encouraged to call the office with any questions, concerns, changes symptoms.  Celesta Gentile, DPM

## 2018-05-20 ENCOUNTER — Ambulatory Visit (INDEPENDENT_AMBULATORY_CARE_PROVIDER_SITE_OTHER): Payer: Medicare Other | Admitting: Family Medicine

## 2018-05-20 ENCOUNTER — Encounter: Payer: Self-pay | Admitting: Family Medicine

## 2018-05-20 VITALS — BP 145/73 | HR 51 | Temp 97.9°F | Resp 20 | Ht 72.0 in | Wt 211.2 lb

## 2018-05-20 DIAGNOSIS — Z23 Encounter for immunization: Secondary | ICD-10-CM | POA: Diagnosis not present

## 2018-05-20 DIAGNOSIS — I10 Essential (primary) hypertension: Secondary | ICD-10-CM | POA: Diagnosis not present

## 2018-05-20 DIAGNOSIS — E785 Hyperlipidemia, unspecified: Secondary | ICD-10-CM

## 2018-05-20 DIAGNOSIS — C8307 Small cell B-cell lymphoma, spleen: Secondary | ICD-10-CM | POA: Diagnosis not present

## 2018-05-20 DIAGNOSIS — E118 Type 2 diabetes mellitus with unspecified complications: Secondary | ICD-10-CM | POA: Diagnosis not present

## 2018-05-20 DIAGNOSIS — E663 Overweight: Secondary | ICD-10-CM

## 2018-05-20 DIAGNOSIS — D61818 Other pancytopenia: Secondary | ICD-10-CM

## 2018-05-20 LAB — POCT GLYCOSYLATED HEMOGLOBIN (HGB A1C): HEMOGLOBIN A1C: 6 % — AB (ref 4.0–5.6)

## 2018-05-20 MED ORDER — METFORMIN HCL ER 500 MG PO TB24: 500.0000 mg | ORAL_TABLET | Freq: Every day | ORAL | 1 refills | Status: DC

## 2018-05-20 MED ORDER — METOPROLOL TARTRATE 25 MG PO TABS: 12.5000 mg | ORAL_TABLET | Freq: Two times a day (BID) | ORAL | 1 refills | Status: DC

## 2018-05-20 MED ORDER — LISINOPRIL 30 MG PO TABS: 30.0000 mg | ORAL_TABLET | Freq: Every day | ORAL | 1 refills | Status: DC

## 2018-05-20 NOTE — Patient Instructions (Addendum)
Diabetes is stable.  Meds refilled if needed.  Make sure to have this years eye exam, due in November.  BP is ok, but borderline. Increase lisinopril to 30 mg a day- take 1.5 tabs of the 20 mg dose you have. NEW pill will be 30 mg a tab--> take only one of these a day.   F/u 4 mos.     Please help [place] help you:  We are honored you have chosen [facility] for your [facility] home. Below you will find basic instructions that you may need to access in the future. Please help [place] help you by reading the instructions, which cover many of the frequent questions we experience.   Prescription refills and request:  -In order to allow more efficient response time, please call your pharmacy for all refills. They will forward the request electronically to [place]. This allows for the quickest possible response. Request left on a nurse line can take longer to refill, since these are checked as time allows between office patients and other phone calls.  - refill request can take up to 3-5 working days to complete.  - If request is sent electronically and request is appropiate, it is usually completed in 1-2 business days.  - all patients will need to be seen routinely for all chronic medical conditions requiring prescription medications (see follow-up below). If you are overdue for follow up on your condition, you will be asked to make an appointment and we will call in enough medication to cover you until your appointment (up to 30 days).  - all controlled substances will require a face to face visit to request/refill.  - if you desire your prescriptions to go through a new pharmacy, and have an active script at original pharmacy, you will need to call your pharmacy and have scripts transferred to new pharmacy. This is completed between the pharmacy locations and not by your provider.    Results: If any images or labs were ordered, it can take up to 1 week to get results depending on the test ordered and  the lab/facility running and resulting the test. - Normal or stable results, which do not need further discussion, may be released to your mychart immediately with attached note to you. A call may not be generated for normal results. Please make certain to sign up for mychart. If you have questions on how to activate your mychart you can call the front office.  - If your results need further discussion, our office will attempt to contact you via phone, and if unable to reach you after 2 attempts, we will release your abnormal result to your mychart with instructions.  - All results will be automatically released in mychart after 1 week.  - Your provider will provide you with explanation and instruction on all relevant material in your results. Please keep in mind, results and labs may appear confusing or abnormal to the untrained eye, but it does not mean they are actually abnormal for you personally. If you have any questions about your results that are not covered, or you desire more detailed explanation than what was provided, you should make an appointment with your provider to do so.   Our office handles many outgoing and incoming calls daily. If we have not contacted you within 1 week about your results, please check your mychart to see if there is a message first and if not, then contact our office.  In helping with this matter, you  help decrease call volume, and therefore allow [place] to be able to respond to patients needs more efficiently.   Acute office visits (sick visit):  An acute visit is intended for a new problem and are scheduled in shorter time slots to allow schedule openings for patients with new problems. This is the appropriate visit to discuss a new problem. Problems will not be addressed by phone call or Echart message. Appointment is needed if requesting treatment. In order to provide you with excellent quality medical care with proper time for you to explain your problem, have an exam  and receive treatment with instructions, these appointments should be limited to one new problem per visit. If you experience a new problem, in which you desire to be addressed, please make an acute office visit, we save openings on the schedule to accommodate you. Please do not save your new problem for any other type of visit, let us take care of it properly and quickly for you.   Follow up visits:  Depending on your condition(s) your provider will need to see you routinely in order to provide you with quality care and prescribe medication(s). Most chronic conditions (Example: hypertension, Diabetes, depression/anxiety... etc), require visits a couple times a year. Your provider will instruct you on proper follow up for your personal medical conditions and history. Please make certain to make follow up appointments for your condition as instructed. Failing to do so could result in lapse in your medication treatment/refills. If you request a refill, and are overdue to be seen on a condition, we will always provide you with a 30 day script (once) to allow you time to schedule.    Medicare wellness (well visit): - we have a wonderful Nurse Maudie Mercury), that will meet with you and provide you will yearly medicare wellness visits. These visits should occur yearly (can not be scheduled less than 1 calendar year apart) and cover preventive health, immunizations, advance directives and screenings you are entitled to yearly through your medicare benefits. Do not miss out on your entitled benefits, this is when medicare will pay for these benefits to be ordered for you.  These are strongly encouraged by your provider and is the appropriate type of visit to make certain you are up to date with all preventive health benefits. If you have not had your medicare wellness exam in the last 12 months, please make certain to schedule one by calling the office and schedule your medicare wellness with Maudie Mercury as soon as possible.    Yearly physical (well visit):  - Adults are recommended to be seen yearly for physicals. Check with your insurance and date of your last physical, most insurances require one calendar year between physicals. Physicals include all preventive health topics, screenings, medical exam and labs that are appropriate for gender/age and history. You may have fasting labs needed at this visit. This is a well visit (not a sick visit), new problems should not be covered during this visit (see acute visit).  - Pediatric patients are seen more frequently when they are younger. Your provider will advise you on well child visit timing that is appropriate for your their age. - This is not a medicare wellness visit. Medicare wellness exams do not have an exam portion to the visit. Some medicare companies allow for a physical, some do not allow a yearly physical. If your medicare allows a yearly physical you can schedule the medicare wellness with our nurse Maudie Mercury and have your physical with your  provider after, on the same day. Please check with insurance for your full benefits.   Late Policy/No Shows:  - all new patients should arrive 15-30 minutes earlier than appointment to allow [place] time  to  obtain all personal demographics,  insurance information and for you to complete office paperwork. - All established patients should arrive 10-15 minutes earlier than appointment time to update all information and be checked in .  - In our best efforts to run on time, if you are late for your appointment you will be asked to either reschedule or if able, we will work you back into the schedule. There will be a wait time to work you back in the schedule,  depending on availability.  - If you are unable to make it to your appointment as scheduled, please call 24 hours ahead of time to allow [place] to fill the time slot with someone else who needs to be seen. If you do not cancel your appointment ahead of time, you may be charged a no show  fee.

## 2018-05-20 NOTE — Progress Notes (Signed)
Patient ID: Marcus Taylor, male   DOB: [birth year], 77 y.o.   MRN: [id number]      Patient ID: Marcus Taylor, male  DOB: 04/22/1941, 77 y.o.   MRN: [id number] Patient Care Team    Relationship Specialty Notifications Start End  [name], DO PCP - General Family Medicine  01/26/16   [name], MD (Inactive) Consulting Physician Gastroenterology  01/26/16   [name], MD Consulting Physician Hematology and Oncology  01/26/16   [name], MD Referring Physician Cardiology  01/26/16   [name], MD Consulting Physician Neurosurgery  01/27/16   [name], OD Referring Physician   05/03/16    Comment: opthalmology     Subjective:  Marcus Taylor is a 77 y.o. male present for follow up on chronic medical issues.  All past medical history, surgical history, allergies, family history, immunizations, medications and social history were updated in the electronic medical record today.  Hypertension/H/o MI/CAD/stentsx3/iron deficiency anemia: Pt routinely follows with cardiology at [facility], Dr. [name]. Pt has had stent placement and MI in 2015. He is prescribed Lipitor 40 mg, plavix, lisinopril 20 mg, lopressor 12.5 (BID). He takes a daily baby asa. He is prescribed nitro, he has not needed it.   Blood pressures ranges at home " are slowly rising". Patient denies chest pain, shortness of breath or lower extremity edema. BMP: 04/18/2018 within normal limits CBC: 04/18/2018 chronic elevated white count of 13.8, hemoglobin 12.5 these are stable for him (followed by oncology) TSH: 03/05/2018 within normal limits Lipid: 01/17/2018 total cholesterol 96, HDL 26, LDL 45, triglycerides 123 Diet: low sodium  Diabetes type 2: Patient reports compliance on metformin 500 mg Monday Wednesday and Friday. He is unable to exercise secondary to orthopedic condition. Patient denies dizziness, hyperglycemic or hypoglycemic events. Patient denies numbness, tingling in the extremities or nonhealing  wounds of feet.  A1c: 6.2 --> 6.0 - Opthalmology:  06/21/2017. [place] Dr. [name].  He has an appointment made next month for yearly repeat. - pna series UTD/completed  - flu shot prided today 05/20/2018 - foot exam completed 07/19/2017 UTD - urine micro on ACei   Past Medical History:  Diagnosis Date  . Anemia   . Arthritis   . Colonic polyp   . Concussion 03/05/2018  . Coronary artery disease    stents x 3 (03-2014)  . Diabetes mellitus   . Diverticulosis    throughout entire colon  . HTN (hypertension)   . Hypertriglyceridemia   . Kidney stone   . Lymphocytosis   . Metatarsal fracture left   left 5th proximal phalanx   . OSA (obstructive sleep apnea)    "Mild" by records  . PONV (postoperative nausea and vomiting)   . RBBB 01/2016  . 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 Splenomegaly   . STEMI (ST elevation myocardial infarction) [facility])    August 2015  . Subarachnoid hemorrhage ([place]) 03/05/2018   No Known Allergies Past Surgical History:  Procedure Laterality Date  . CARDIAC CATHETERIZATION     stents x 3 (03/2014)  . CHOLECYSTECTOMY    . HERNIA REPAIR    . INGUINAL HERNIA REPAIR Right 05/27/2015   Procedure: LAPAROSCOPIC REPAIR RIGHT  INGUINAL HERNIA;  Surgeon: Greer Pickerel, MD;  Location: [facility];  Service: General;  Laterality: Right;  . INSERTION OF MESH Right 05/27/2015   Procedure: INSERTION OF MESH;  Surgeon: Greer Pickerel, MD;  Location: [facility];  Service: General;  Laterality: Right;  .  LUMBAR DISC SURGERY    . TONSILLECTOMY AND ADENOIDECTOMY     Family History  Problem Relation Age of Onset  . Kidney disease Sister   . Colon cancer Sister 94  . Colon cancer Brother   . Arthritis Mother   . Breast cancer Mother   . Heart disease Mother   . Bladder Cancer Brother   . Arthritis Son   . Esophageal cancer Sister   . Rectal cancer Neg Hx   . Stomach cancer Neg Hx    Social History   Socioeconomic History  . Marital status: Married     Spouse name: Not on file  . Number of children: 2  . Years of education: Not on file  . 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 Financial resource strain: Not on file  . Food insecurity:    Worry: Not on file    Inability: Not on file  . Transportation needs:    Medical: Not on file    Non-medical: Not on file  Tobacco Use  . Smoking status: Former Smoker    Packs/day: 1.00    Years: 20.00    Pack years: 20.00    Last attempt to quit: 08/14/1988    Years since quitting: 29.7  . Smokeless tobacco: Never Used  Substance and Sexual Activity  . Alcohol use: No  . Drug use: No  . Sexual activity: Yes    Birth control/protection: None  Lifestyle  . Physical activity:    Days per week: Not on file    Minutes per session: Not on file  . Stress: Not on file  Relationships  . Social connections:    Talks on phone: Not on file    Gets together: Not on file    Attends religious service: Not on file    Active member of club or organization: Not on file    Attends meetings of clubs or organizations: Not on file    Relationship status: Not on file  . Intimate partner violence:    Fear of current or ex partner: Not on file    Emotionally abused: Not on file    Physically abused: Not on file    Forced sexual activity: Not on file  Other Topics Concern  . Not on file  Social History Narrative   Married to [place].    HS education. Retired.    Former smoker. No drugs or ETOH.   Wears seatbelt.   Wears dentures.   Smoke detector in the home. Firearms  In the home.    Feels safe in his relationships.    ROS: Negative, with the exception of above mentioned in HPI  Objective: BP (!) 145/73 (BP Location: Left Arm, Patient Position: Sitting, Cuff Size: Large)   Pulse (!) 51   Temp 97.9 F (36.6 C)   Resp 20   Ht 6' (1.829 m)   Wt 211 lb 4 oz (95.8 kg)   SpO2 100%   BMI 28.65 kg/m     Gen: Afebrile. No acute distress.  Nontoxic and presentation.  Very pleasant Caucasian male.  Overweight. HENT: AT. [place].  MMM.  Eyes:Pupils Equal Round Reactive to light, Extraocular movements intact,  Conjunctiva without redness, discharge or icterus. CV: RRR no murmur, no edema, +2/4 P posterior tibialis pulses Chest: CTAB, no wheeze or crackles Abd: Soft. NTND. BS present.  No masses palpated.  Skin: no rashes, purpura or petechiae.  Neuro: Normal gait. PERLA. EOMi. Alert. Oriented.  Psych: Normal affect, dress and demeanor. Normal speech. Normal thought content and judgment.  Results for orders placed or performed in visit on 05/20/18 (from the past 48 hour(s))  POCT glycosylated hemoglobin (Hb A1C)     Status: Abnormal   Collection Time: 05/20/18  9:33 AM  Result Value Ref Range   Hemoglobin A1C 6.0 (A) 4.0 - 5.6 %   HbA1c POC (<> result, manual entry)     HbA1c, POC (prediabetic range)     HbA1c, POC (controlled diabetic range)      Assessment/plan: Marcus Taylor is a 77 y.o. male present for chronic medical condition follow up.  Microalbuminuria due to type 2 diabetes mellitus (HCC)/Controlled type 2 diabetes mellitus with complication, without long-term current use of insulin (HCC) -Stable A1c 6.2--> 6.0 today  >> continue current regimen - Continue metformin 500 mg Monday, Wednesday and Friday only.   Opthalmology:  06/21/2017. [place] Dr. [name].  He has an appointment made next month for yearly repeat. - pna series UTD/completed  - flu shot prided today 05/20/2018 - foot exam completed 07/19/2017 UTD - urine micro on ACei - Follow-up 4-6 months  Essential hypertension/Coronary artery disease involving native coronary artery of native heart without angina pectoris/History of ST elevation myocardial infarction (STEMI)/ - increase lisinopril to 30 mg QD.  Follows yearly with cardiology.  - Continue ASA, plavix, lopressor, lisinopril and statin. - Low sodium diet  encouraged. -Labs up-to-date - F/U q 4-6  months  Splenic Margicnal Zone B-cell lymphoma/thrombocytopenia: - continue to  follow up with heme/onc. Currently on low dose daily steroid.   Flu shot provided today  Return in about 4 months (around 09/20/2018) for HTN/DM.    Electronically signed by: Howard Pouch, DO [place]

## 2018-05-22 ENCOUNTER — Encounter: Payer: Self-pay | Admitting: Family Medicine

## 2018-05-22 DIAGNOSIS — E663 Overweight: Secondary | ICD-10-CM | POA: Insufficient documentation

## 2018-05-23 ENCOUNTER — Encounter: Payer: Self-pay | Admitting: Hematology and Oncology

## 2018-05-23 ENCOUNTER — Telehealth: Payer: Self-pay

## 2018-05-23 ENCOUNTER — Inpatient Hospital Stay: Payer: Medicare Other | Attending: Hematology and Oncology | Admitting: Hematology and Oncology

## 2018-05-23 ENCOUNTER — Inpatient Hospital Stay: Payer: Medicare Other

## 2018-05-23 ENCOUNTER — Telehealth: Payer: Self-pay | Admitting: Hematology and Oncology

## 2018-05-23 ENCOUNTER — Other Ambulatory Visit: Payer: Self-pay | Admitting: Hematology and Oncology

## 2018-05-23 DIAGNOSIS — C8307 Small cell B-cell lymphoma, spleen: Secondary | ICD-10-CM

## 2018-05-23 LAB — CBC WITH DIFFERENTIAL/PLATELET
BASOS PCT: 0 %
Basophils Absolute: 0 10*3/uL (ref 0.0–0.1)
EOS PCT: 0 %
Eosinophils Absolute: 0 10*3/uL (ref 0.0–0.5)
HCT: 40.3 % (ref 39.0–52.0)
HEMOGLOBIN: 13 g/dL (ref 13.0–17.0)
Lymphocytes Relative: 70 %
Lymphs Abs: 11.7 10*3/uL — ABNORMAL HIGH (ref 0.7–4.0)
MCH: 28.5 pg (ref 26.0–34.0)
MCHC: 32.3 g/dL (ref 30.0–36.0)
MCV: 88.4 fL (ref 80.0–100.0)
MONO ABS: 1.1 10*3/uL — AB (ref 0.1–1.0)
MONOS PCT: 7 %
NEUTROS ABS: 3.8 10*3/uL (ref 1.7–7.7)
Neutrophils Relative %: 22 %
PLATELETS: 147 10*3/uL — AB (ref 150–400)
RBC: 4.56 MIL/uL (ref 4.22–5.81)
RDW: 16.9 % — AB (ref 11.5–15.5)
WBC: 16.7 10*3/uL — AB (ref 4.0–10.5)
nRBC: 0 % (ref 0.0–0.2)

## 2018-05-23 NOTE — Telephone Encounter (Signed)
Gave pt avs and calendar  °

## 2018-05-23 NOTE — Telephone Encounter (Signed)
Called per Dr. Alvy Bimler, instructed to continue taking Prednisone 10 mg daily. He verbalized understanding.

## 2018-05-24 ENCOUNTER — Encounter: Payer: Self-pay | Admitting: Hematology and Oncology

## 2018-05-24 NOTE — Assessment & Plan Note (Signed)
He has been on prednisone for several months Upon reducing the dose of prednisone, his lymphocytosis is a bit worse I recommend continue on 10 mg prednisone Plan to repeat CT imaging before I see him back in December for objective response to therapy

## 2018-05-24 NOTE — Progress Notes (Signed)
[facility] OFFICE PROGRESS NOTE  Patient Care Team: [name], DO as PCP - General (Family Medicine) [name], MD (Inactive) as Consulting Physician (Gastroenterology) [name], MD as Consulting Physician (Hematology and Oncology) [name], MD as Referring Physician (Cardiology) [name], MD as Consulting Physician (Neurosurgery) [name], OD as Referring Physician  ASSESSMENT & PLAN:  Splenic marginal zone b-cell lymphoma [facility]) He has been on prednisone for several months Upon reducing the dose of prednisone, his lymphocytosis is a bit worse I recommend continue on 10 mg prednisone Plan to repeat CT imaging before I see him back in December for objective response to therapy   Orders Placed This Encounter  Procedures  . CT ABDOMEN PELVIS W CONTRAST    Standing Status:   Future    Standing Expiration Date:   05/25/2019    Order Specific Question:   If indicated for the ordered procedure, I authorize the administration of contrast media per Radiology protocol    Answer:   Yes    Order Specific Question:   Preferred imaging location?    Answer:   [facility]    Order Specific Question:   Radiology Contrast Protocol - do NOT remove file path    Answer:   [email or web address]  . CBC with Differential/Platelet    Standing Status:   Future    Standing Expiration Date:   06/28/2019  . Comprehensive metabolic panel    Standing Status:   Future    Standing Expiration Date:   06/28/2019  . Lactate dehydrogenase    Standing Status:   Future    Standing Expiration Date:   05/25/2019  . Uric acid    Standing Status:   Future    Standing Expiration Date:   05/25/2019    INTERVAL HISTORY: Please see below for problem oriented charting. He returns for further follow-up He feels well The prednisone has helped with his joint pain His appetite is stable No new lymphadenopathy Denies recent infection, fever  or chills  SUMMARY OF ONCOLOGIC HISTORY:  This is a very pleasant gentleman who was found to have abnormal CBC for the past 5 years. In April 2013, flow cytometry detectable monoclonal B-cell population suspicious for splenic/marginal zone lymphoma. Ultrasound confirmed splenomegaly. Due to lack of symptoms he was being observed. In October 2017, he was noted to have microcytic, iron deficiency anemia. He was started on oral iron supplement On 03/05/2018, the patient had an accidental fall.  CT imaging revealed significant splenomegaly On 03/14/2018, he was started on 20 mg of prednisone daily On April 18, 2018, the dose of prednisone was reduced to 10 mg daily  REVIEW OF SYSTEMS:   Constitutional: Denies fevers, chills or abnormal weight loss Eyes: Denies blurriness of vision Ears, nose, mouth, throat, and face: Denies mucositis or sore throat Respiratory: Denies cough, dyspnea or wheezes Cardiovascular: Denies palpitation, chest discomfort or lower extremity swelling Gastrointestinal:  Denies nausea, heartburn or change in bowel habits Skin: Denies abnormal skin rashes Lymphatics: Denies new lymphadenopathy or easy bruising Neurological:Denies numbness, tingling or new weaknesses Behavioral/Psych: Mood is stable, no new changes  All other systems were reviewed with the patient and are negative.  I have reviewed the past medical history, past surgical history, social history and family history with the patient and they are unchanged from previous note.  ALLERGIES:  has No Known Allergies.  MEDICATIONS:  Current Outpatient Medications  Medication Sig Dispense Refill  . aspirin EC 81 MG  tablet Take 81 mg by mouth daily.    Marland Kitchen atorvastatin (LIPITOR) 40 MG tablet Take 1 tablet (40 mg total) by mouth daily. 90 tablet 3  . clopidogrel (PLAVIX) 75 MG tablet Take 75 mg by mouth daily.    . ferrous sulfate 325 (65 FE) MG tablet Take 325 mg by mouth daily with breakfast.    . lisinopril  (PRINIVIL,ZESTRIL) 30 MG tablet Take 1 tablet (30 mg total) by mouth daily. 90 tablet 1  . metFORMIN (GLUCOPHAGE-XR) 500 MG 24 hr tablet Take 1 tablet (500 mg total) by mouth daily with breakfast. TAKE 1 TABLET BY MOUTH WITH EVENING MEAL M-W-F 45 tablet 1  . metoprolol tartrate (LOPRESSOR) 25 MG tablet Take 0.5 tablets (12.5 mg total) by mouth 2 (two) times daily. 90 tablet 1  . nitroGLYCERIN (NITROSTAT) 0.4 MG SL tablet Place 1 tablet (0.4 mg total) under the tongue every 5 (five) minutes as needed for chest pain. 10 tablet 0  . predniSONE (DELTASONE) 10 MG tablet TAKE 2 TABLETS BY MOUTH DAILY WITH BREAKFAST 60 tablet 0   Current Facility-Administered Medications  Medication Dose Route Frequency Provider Last Rate Last Dose  . 0.9 %  sodium chloride infusion  500 mL Intravenous Continuous Armbruster, Carlota Raspberry, MD        PHYSICAL EXAMINATION: ECOG PERFORMANCE STATUS: 1 - Symptomatic but completely ambulatory  Vitals:   05/23/18 1400  BP: (!) 151/53  Pulse: 63  Resp: 18  Temp: 97.9 F (36.6 C)  SpO2: 97%   Filed Weights   05/23/18 1400  Weight: 208 lb 12.8 oz (94.7 kg)    GENERAL:alert, no distress and comfortable NEURO: alert & oriented x 3 with fluent speech, no focal motor/sensory deficits  LABORATORY DATA:  I have reviewed the data as listed    Component Value Date/Time   NA 141 04/18/2018 1205   NA 144 05/31/2015 1404   K 4.3 04/18/2018 1205   K 4.7 05/31/2015 1404   CL 107 04/18/2018 1205   CL 102 10/07/2012 1013   CO2 27 04/18/2018 1205   CO2 29 05/31/2015 1404   GLUCOSE 109 (H) 04/18/2018 1205   GLUCOSE 99 05/31/2015 1404   GLUCOSE 199 (H) 10/07/2012 1013   BUN 16 04/18/2018 1205   BUN 18.3 05/31/2015 1404   CREATININE 0.83 04/18/2018 1205   CREATININE 0.86 03/13/2017 0759   CREATININE 0.9 05/31/2015 1404   CALCIUM 9.3 04/18/2018 1205   CALCIUM 9.2 05/31/2015 1404   PROT 5.9 (L) 04/18/2018 1205   PROT 6.3 (L) 05/31/2015 1404   ALBUMIN 3.7 04/18/2018 1205    ALBUMIN 3.9 05/31/2015 1404   AST 27 04/18/2018 1205   AST 28 05/31/2015 1404   ALT 23 04/18/2018 1205   ALT 23 05/31/2015 1404   ALKPHOS 63 04/18/2018 1205   ALKPHOS 79 05/31/2015 1404   BILITOT 0.4 04/18/2018 1205   BILITOT 0.46 05/31/2015 1404   GFRNONAA >60 04/18/2018 1205   GFRNONAA 85 03/13/2017 0759   GFRAA >60 04/18/2018 1205   GFRAA >89 03/13/2017 0759    No results found for: SPEP, UPEP  Lab Results  Component Value Date   WBC 16.7 (H) 05/23/2018   NEUTROABS 3.8 05/23/2018   HGB 13.0 05/23/2018   HCT 40.3 05/23/2018   MCV 88.4 05/23/2018   PLT 147 (L) 05/23/2018      Chemistry      Component Value Date/Time   NA 141 04/18/2018 1205   NA 144 05/31/2015 1404   K 4.3  04/18/2018 1205   K 4.7 05/31/2015 1404   CL 107 04/18/2018 1205   CL 102 10/07/2012 1013   CO2 27 04/18/2018 1205   CO2 29 05/31/2015 1404   BUN 16 04/18/2018 1205   BUN 18.3 05/31/2015 1404   CREATININE 0.83 04/18/2018 1205   CREATININE 0.86 03/13/2017 0759   CREATININE 0.9 05/31/2015 1404      Component Value Date/Time   CALCIUM 9.3 04/18/2018 1205   CALCIUM 9.2 05/31/2015 1404   ALKPHOS 63 04/18/2018 1205   ALKPHOS 79 05/31/2015 1404   AST 27 04/18/2018 1205   AST 28 05/31/2015 1404   ALT 23 04/18/2018 1205   ALT 23 05/31/2015 1404   BILITOT 0.4 04/18/2018 1205   BILITOT 0.46 05/31/2015 1404     All questions were answered. The patient knows to call the clinic with any problems, questions or concerns. No barriers to learning was detected.  I spent 15 minutes counseling the patient face to face. The total time spent in the appointment was 20 minutes and more than 50% was on counseling and review of test results  [name], MD 05/24/2018 8:50 AM

## 2018-05-31 ENCOUNTER — Other Ambulatory Visit: Payer: Medicare Other

## 2018-05-31 ENCOUNTER — Ambulatory Visit: Payer: Medicare Other | Admitting: Hematology and Oncology

## 2018-06-13 ENCOUNTER — Ambulatory Visit (INDEPENDENT_AMBULATORY_CARE_PROVIDER_SITE_OTHER): Payer: Medicare Other | Admitting: Orthopaedic Surgery

## 2018-06-13 ENCOUNTER — Encounter (INDEPENDENT_AMBULATORY_CARE_PROVIDER_SITE_OTHER): Payer: Self-pay | Admitting: Orthopaedic Surgery

## 2018-06-13 VITALS — BP 146/73 | HR 61 | Ht 72.0 in | Wt 208.0 lb

## 2018-06-13 DIAGNOSIS — M1711 Unilateral primary osteoarthritis, right knee: Secondary | ICD-10-CM | POA: Diagnosis not present

## 2018-06-13 NOTE — Progress Notes (Signed)
Office Visit Note   Patient: Marcus Taylor           Date of Birth: 11-24-1940           MRN: [id number] Visit Date: 06/13/2018              Requested by: [name], DO [address], [place] [address] PCP: [name], DO   Assessment & Plan: Visit Diagnoses:  1. Unilateral primary osteoarthritis, right knee     Plan: End-stage osteoarthritis right knee.  Long discussion regarding present status of knee.  Mr. Gorden Harms would like to proceed with a knee replacement.  He will need clearances from his primary care physician, cardiologist and oncologist.  Have discussed in length the surgery and the potential complications.  Total office time was over 30 minutes 50% of the time in counseling  Follow-Up Instructions: Return will schedule right TKR.   Orders:  No orders of the defined types were placed in this encounter.  No orders of the defined types were placed in this encounter.     Procedures: No procedures performed   Clinical Data: No additional findings.   Subjective: Chief Complaint  Patient presents with  . Follow-up    F/U 04/19/18 HAD R KNEE INJECTION, KNEE WAS WORSE AFTER INJECTION UNTIL 1 WEEK AGO, STILL HAVING PAIN   Mr. Gorden Harms has end-stage osteoarthritis of his right knee.  He has had several cortisone in the past 6 months with little relief injection about 6 weeks ago.  He has persistent swelling and a sensation of his knee giving way in addition to the pain.  He is on Plavix having had a prior heart stent and cannot take anti-inflammatory medicines.  He is having significant compromise of his daily activities.  Prior films demonstrate near bone-on-bone in the medial compartment associated with tricompartmental degenerative changes. HPI  Review of Systems  Constitutional: Negative for fatigue and fever.  HENT: Negative for ear pain.   Eyes: Negative for pain.  Respiratory: Negative for cough.   Cardiovascular: Negative for leg swelling.   Gastrointestinal: Negative for constipation and diarrhea.  Genitourinary: Negative for difficulty urinating.  Musculoskeletal: Positive for back pain. Negative for neck pain.  Skin: Negative for rash.  Allergic/Immunologic: Negative for food allergies.  Neurological: Positive for weakness. Negative for numbness.  Psychiatric/Behavioral: Positive for sleep disturbance.     Objective: Vital Signs: BP (!) 146/73 (BP Location: Right Arm, Patient Position: Sitting, Cuff Size: Normal)   Pulse 61   Ht 6' (1.829 m)   Wt 208 lb (94.3 kg)   BMI 28.21 kg/m   Physical Exam  Constitutional: He is oriented to person, place, and time. He appears well-developed and well-nourished.  HENT:  Mouth/Throat: Oropharynx is clear and moist.  Eyes: Pupils are equal, round, and reactive to light. EOM are normal.  Pulmonary/Chest: Effort normal.  Neurological: He is alert and oriented to person, place, and time.  Skin: Skin is warm and dry.  Psychiatric: He has a normal mood and affect. His behavior is normal.    Ortho Exam awake alert and oriented x3.  Comfortable sitting right knee with large effusion.  Knee was not warm or hot.  No instability.  Lacks a few degrees to full extension.  Flexed over 105degrees.  No popliteal pain.  No calf pain.  +1 pulses.  Good capillary refill to foot. motor exam intact.  Straight leg raise negative painless range of motion right hip.  Slight increased  varus right knee with weightbearing.  Does walk with a mild limp  Specialty Comments:  No specialty comments available.  Imaging: No results found.   PMFS History: Patient Active Problem List   Diagnosis Date Noted  . Overweight (BMI 25.0-29.9) 05/22/2018  . Pancytopenia, acquired ([place]) 04/19/2018  . Easy bruising 04/19/2018  . Unilateral primary osteoarthritis, right knee 04/19/2018  . Decreased hearing of both ears 03/20/2018  . Splenomegaly 03/12/2018  . Enlarged prostate without lower urinary tract  symptoms ([place]) 03/12/2018  . Subarachnoid hematoma ([place]) 03/12/2018  . Neck pain 02/19/2018  . IDA (iron deficiency anemia) 08/09/2016  . Thrombocytopenia ([place]) 05/30/2016  . Diabetes mellitus type 2, controlled, with complications ([place]) 56/43/3295  . Right inguinal hernia 02/03/2016  . History of colon polyps 01/27/2016  . History of ST elevation myocardial infarction (STEMI) 01/27/2016  . CAD (coronary artery disease) 01/27/2016  . Hyperlipidemia 01/27/2016  . Anemia in neoplastic disease 05/25/2014  . Splenic marginal zone b-cell lymphoma ([place]) 12/18/2011  . HTN (hypertension)    Past Medical History:  Diagnosis Date  . Anemia   . Arthritis   . Colonic polyp   . Concussion 03/05/2018  . Coronary artery disease    stents x 3 (03-2014)  . Diabetes mellitus   . Diverticulosis    throughout entire colon  . HTN (hypertension)   . Hypertriglyceridemia   . Kidney stone   . Lymphocytosis   . Metatarsal fracture left   left 5th proximal phalanx   . OSA (obstructive sleep apnea)    "Mild" by records  . PONV (postoperative nausea and vomiting)   . RBBB 01/2016  . Splenic marginal zone b-cell lymphoma ([place]) 12/18/2011  . Splenomegaly   . STEMI (ST elevation myocardial infarction) [facility])    August 2015  . Subarachnoid hemorrhage ([place]) 03/05/2018    Family History  Problem Relation Age of Onset  . Kidney disease Sister   . Colon cancer Sister 16  . Colon cancer Brother   . Arthritis Mother   . Breast cancer Mother   . Heart disease Mother   . Bladder Cancer Brother   . Arthritis Son   . Esophageal cancer Sister   . Rectal cancer Neg Hx   . Stomach cancer Neg Hx     Past Surgical History:  Procedure Laterality Date  . CARDIAC CATHETERIZATION     stents x 3 (03/2014)  . CHOLECYSTECTOMY    . HERNIA REPAIR    . INGUINAL HERNIA REPAIR Right 05/27/2015   Procedure: LAPAROSCOPIC REPAIR RIGHT  INGUINAL HERNIA;  Surgeon: Greer Pickerel, MD;  Location: [facility];  Service: General;   Laterality: Right;  . INSERTION OF MESH Right 05/27/2015   Procedure: INSERTION OF MESH;  Surgeon: Greer Pickerel, MD;  Location: [facility];  Service: General;  Laterality: Right;  . LUMBAR DISC SURGERY    . 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 Smoking status: Former Smoker    Packs/day: 1.00    Years: 20.00    Pack years: 20.00    Last attempt to quit: 08/14/1988    Years since quitting: 29.8  . Smokeless tobacco: Never Used  Substance and Sexual Activity  . Alcohol use: No  . Drug use: No  . Sexual activity: Yes    Birth control/protection: None

## 2018-06-13 NOTE — Progress Notes (Addendum)
Subjective:   Marcus Taylor is a 77 y.o. male who presents for Medicare Annual/Subsequent preventive examination.  Review of Systems:  No ROS.  Medicare Wellness Visit. Additional risk factors are reflected in the social history.    Sleep patterns: Sleeps 4 hours.  Home Safety/Smoke Alarms: Feels safe in home. Smoke alarms in place.  Living environment; residence and Firearm Safety: Lives with wife in 1 story home. Ramp at door.  Seat Belt Safety/Bike Helmet: Wears seat belt.   Male:   CCS-Colonoscopy 04/18/2017, polyp.  Recall 5 years, per patient. H/O colon cancer.   PSA-  Lab Results  Component Value Date   PSA 1.85 03/12/2018       Objective:    Vitals: There were no vitals taken for this visit.  There is no height or weight on file to calculate BMI.  Advanced Directives 03/06/2018 03/05/2018 08/28/2016 05/30/2016 05/31/2015 05/24/2015 05/25/2014  Does Patient Have a Medical Advance Directive? Yes Yes No No No No Yes  Type of Paramedic of [place];Living will - - - - - Living will;Healthcare Power of Attorney  Does patient want to make changes to medical advance directive? No - Patient declined - - - - - No - Patient declined  Copy of [place] in Chart? - - - - - - No - copy requested  Would patient like information on creating a medical advance directive? - - - No - patient declined information No - patient declined information No - patient declined information -    Tobacco Social History   Tobacco Use  Smoking Status Former Smoker  . Packs/day: 1.00  . Years: 20.00  . Pack years: 20.00  . Last attempt to quit: 08/14/1988  . Years since quitting: 29.8  Smokeless Tobacco Never Used     Counseling given: Not Answered   Past Medical History:  Diagnosis Date  . Anemia   . Arthritis   . Colonic polyp   . Concussion 03/05/2018  . Coronary artery disease    stents x 3 (03-2014)  . Diabetes mellitus   .  Diverticulosis    throughout entire colon  . HTN (hypertension)   . Hypertriglyceridemia   . Kidney stone   . Lymphocytosis   . Metatarsal fracture left   left 5th proximal phalanx   . OSA (obstructive sleep apnea)    "Mild" by records  . PONV (postoperative nausea and vomiting)   . RBBB 01/2016  . Splenic marginal zone b-cell lymphoma ([place]) 12/18/2011  . Splenomegaly   . STEMI (ST elevation myocardial infarction) [facility])    August 2015  . Subarachnoid hemorrhage ([place]) 03/05/2018   Past Surgical History:  Procedure Laterality Date  . CARDIAC CATHETERIZATION     stents x 3 (03/2014)  . CHOLECYSTECTOMY    . HERNIA REPAIR    . INGUINAL HERNIA REPAIR Right 05/27/2015   Procedure: LAPAROSCOPIC REPAIR RIGHT  INGUINAL HERNIA;  Surgeon: Greer Pickerel, MD;  Location[facility];  Service: General;  Laterality: Right;  . INSERTION OF MESH Right 05/27/2015   Procedure: INSERTION OF MESH;  Surgeon: Greer Pickerel, MD;  Location: [facility];  Service: General;  Laterality: Right;  . LUMBAR DISC SURGERY    . TONSILLECTOMY AND ADENOIDECTOMY     Family History  Problem Relation Age of Onset  . Kidney disease Sister   . Colon cancer Sister 57  . Colon cancer Brother   . Arthritis Mother   . Breast cancer Mother   .  Heart disease Mother   . Bladder Cancer Brother   . Arthritis Son   . Esophageal cancer Sister   . Rectal cancer Neg Hx   . Stomach cancer Neg Hx    Social History   Socioeconomic History  . Marital status: Married    Spouse name: Not on file  . Number of children: 2  . Years of education: Not on file  . 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 Financial resource strain: Not on file  . Food insecurity:    Worry: Not on file    Inability: Not on file  . Transportation needs:    Medical: Not on file    Non-medical: Not on file  Tobacco Use  . Smoking status: Former Smoker    Packs/day:  1.00    Years: 20.00    Pack years: 20.00    Last attempt to quit: 08/14/1988    Years since quitting: 29.8  . Smokeless tobacco: Never Used  Substance and Sexual Activity  . Alcohol use: No  . Drug use: No  . Sexual activity: Yes    Birth control/protection: None  Lifestyle  . Physical activity:    Days per week: Not on file    Minutes per session: Not on file  . Stress: Not on file  Relationships  . Social connections:    Talks on phone: Not on file    Gets together: Not on file    Attends religious service: Not on file    Active member of club or organization: Not on file    Attends meetings of clubs or organizations: Not on file    Relationship status: Not on file  Other Topics Concern  . Not on file  Social History Narrative   Married to Marcus Taylor.    HS education. Retired.    Former smoker. No drugs or ETOH.   Wears seatbelt.   Wears dentures.   Smoke detector in the home. Firearms  In the home.    Feels safe in his relationships.     Outpatient Encounter Medications as of 06/14/2018  Medication Sig  . aspirin EC 81 MG tablet Take 81 mg by mouth daily.  Marland Kitchen atorvastatin (LIPITOR) 40 MG tablet Take 1 tablet (40 mg total) by mouth daily.  . clopidogrel (PLAVIX) 75 MG tablet Take 75 mg by mouth daily.  . ferrous sulfate 325 (65 FE) MG tablet Take 325 mg by mouth daily with breakfast.  . lisinopril (PRINIVIL,ZESTRIL) 30 MG tablet Take 1 tablet (30 mg total) by mouth daily.  . metFORMIN (GLUCOPHAGE-XR) 500 MG 24 hr tablet Take 1 tablet (500 mg total) by mouth daily with breakfast. TAKE 1 TABLET BY MOUTH WITH EVENING MEAL M-W-F  . metoprolol tartrate (LOPRESSOR) 25 MG tablet Take 0.5 tablets (12.5 mg total) by mouth 2 (two) times daily.  . nitroGLYCERIN (NITROSTAT) 0.4 MG SL tablet Place 1 tablet (0.4 mg total) under the tongue every 5 (five) minutes as needed for chest pain.  . predniSONE (DELTASONE) 10 MG tablet TAKE 2 TABLETS BY MOUTH DAILY WITH BREAKFAST    Facility-Administered Encounter Medications as of 06/14/2018  Medication  . 0.9 %  sodium chloride infusion    Activities of Daily Living In your present state of health, do you have any difficulty performing the following activities: 03/06/2018  Hearing? N  Vision? N  Difficulty concentrating or making  decisions? N  Walking or climbing stairs? N  Dressing or bathing? N  Doing errands, shopping? N  Some recent data might be hidden    Patient Care Team: [name], DO as PCP - General (Family Medicine) [name], MD (Inactive) as Consulting Physician (Gastroenterology) [name], MD as Consulting Physician (Hematology and Oncology) [name], MD as Referring Physician (Cardiology) [name], MD as Consulting Physician (Neurosurgery) [name], OD as Referring Physician   Assessment:   This is a routine wellness examination for Marcus Taylor.  Exercise Activities and Dietary recommendations   Diet (meal preparation, eat out, water intake, caffeinated beverages, dairy products, fruits and vegetables):  Drinks water and Diet Coke.   Breakfast: Cheerios Lunch: sandwich Dinner: salad, chicken sandwich  Goals   None     Fall Risk Fall Risk  05/20/2018 03/20/2018 03/12/2018 01/17/2018 03/19/2017  Falls in the past year? Yes Yes Yes No No  Number falls in past yr: 2 or more 1 1 - -  Injury with Fall? Yes Yes Yes - -  Risk Factor Category  High Fall Risk - - - -  Follow up Falls evaluation completed;Falls prevention discussed Falls evaluation completed;Falls prevention discussed Falls prevention discussed;Education provided;Falls evaluation completed - -   Depression Screen PHQ 2/9 Scores 05/20/2018 03/20/2018 03/12/2018 01/17/2018  PHQ - 2 Score 0 0 0 0    Cognitive Function        Immunization History  Administered Date(s) Administered  . Influenza Split 05/07/2012  . Influenza, High Dose Seasonal PF 05/03/2016, 07/02/2017, 05/20/2018  . Influenza,inj,Quad  PF,6+ Mos 09/22/2013, 05/25/2014, 05/31/2015  . Pneumococcal Conjugate-13 09/28/2014  . Pneumococcal Polysaccharide-23 10/05/2011  . Tdap 03/24/2014    Screening Tests Health Maintenance  Topic Date Due  . OPHTHALMOLOGY EXAM  06/21/2018  . FOOT EXAM  07/19/2018  . HEMOGLOBIN A1C  11/19/2018  . COLONOSCOPY  04/18/2020  . TETANUS/TDAP  03/24/2024  . INFLUENZA VACCINE  Completed  . PNA vac Low Risk Adult  Completed        Plan:    Shingles vaccine  Continue doing brain stimulating activities (puzzles, reading, adult coloring books, staying active) to keep memory sharp.   Bring a copy of your living will and/or healthcare power of attorney to your next office visit.  I have personally reviewed and noted the following in the patient's chart:   . Medical and social history . Use of alcohol, tobacco or illicit drugs  . Current medications and supplements . Functional ability and status . Nutritional status . Physical activity . Advanced directives . List of other physicians . Hospitalizations, surgeries, and ER visits in previous 12 months . Vitals . Screenings to include cognitive, depression, and falls . Referrals and appointments  In addition, I have reviewed and discussed with patient certain preventive protocols, quality metrics, and best practice recommendations. A written personalized care plan for preventive services as well as general preventive health recommendations were provided to patient.     Gerilyn Nestle, RN  06/13/2018  PCP Notes: -Planning to have right knee replacement soon, no date as of yet.  -F/U with PCP 09/2018  Medical screening examination/treatment/procedure(s) were performed by non-physician practitioner and as supervising physician I was immediately available for consultation/collaboration.  I agree with above assessment and plan.  Electronically Signed by: Howard Pouch, DO [facility]

## 2018-06-14 ENCOUNTER — Ambulatory Visit (INDEPENDENT_AMBULATORY_CARE_PROVIDER_SITE_OTHER): Payer: Medicare Other

## 2018-06-14 ENCOUNTER — Other Ambulatory Visit: Payer: Self-pay

## 2018-06-14 ENCOUNTER — Telehealth: Payer: Self-pay | Admitting: Family Medicine

## 2018-06-14 VITALS — BP 136/80 | HR 60 | Ht 72.0 in | Wt 210.1 lb

## 2018-06-14 DIAGNOSIS — Z0000 Encounter for general adult medical examination without abnormal findings: Secondary | ICD-10-CM

## 2018-06-14 NOTE — Telephone Encounter (Signed)
Patient dropped off pre-operative clearance form today while in the office for Medicare Annual Wellness visit. Pt requesting pcp complete form for knee replacement surgery.  Charge sheet generated, attached to form and placed in md folder at front desk.

## 2018-06-14 NOTE — Telephone Encounter (Signed)
Completed. Please advise pt he also needs clearance from cardio and onc.

## 2018-06-14 NOTE — Telephone Encounter (Signed)
Medical clearance form placed on Dr Lucita Lora desk.

## 2018-06-14 NOTE — Patient Instructions (Addendum)
Shingles vaccine  Continue doing brain stimulating activities (puzzles, reading, adult coloring books, staying active) to keep memory sharp.   Bring a copy of your living will and/or healthcare power of attorney to your next office visit.   Health Maintenance, Male A healthy lifestyle and preventive care is important for your health and wellness. Ask your health care provider about what schedule of regular examinations is right for you. What should I know about weight and diet? Eat a Healthy Diet  Eat plenty of vegetables, fruits, whole grains, low-fat dairy products, and lean protein.  Do not eat a lot of foods high in solid fats, added sugars, or salt.  Maintain a Healthy Weight Regular exercise can help you achieve or maintain a healthy weight. You should:  Do at least 150 minutes of exercise each week. The exercise should increase your heart rate and make you sweat (moderate-intensity exercise).  Do strength-training exercises at least twice a week.  Watch Your Levels of Cholesterol and Blood Lipids  Have your blood tested for lipids and cholesterol every 5 years starting at 77 years of age. If you are at high risk for heart disease, you should start having your blood tested when you are 77 years old. You may need to have your cholesterol levels checked more often if: ? Your lipid or cholesterol levels are high. ? You are older than 77 years of age. ? You are at high risk for heart disease.  What should I know about cancer screening? Many types of cancers can be detected early and may often be prevented. Lung Cancer  You should be screened every year for lung cancer if: ? You are a current smoker who has smoked for at least 30 years. ? You are a former smoker who has quit within the past 15 years.  Talk to your health care provider about your screening options, when you should start screening, and how often you should be screened.  Colorectal Cancer  Routine colorectal  cancer screening usually begins at 77 years of age and should be repeated every 5-10 years until you are 77 years old. You may need to be screened more often if early forms of precancerous polyps or small growths are found. Your health care provider may recommend screening at an earlier age if you have risk factors for colon cancer.  Your health care provider may recommend using home test kits to check for hidden blood in the stool.  A small camera at the end of a tube can be used to examine your colon (sigmoidoscopy or colonoscopy). This checks for the earliest forms of colorectal cancer.  Prostate and Testicular Cancer  Depending on your age and overall health, your health care provider may do certain tests to screen for prostate and testicular cancer.  Talk to your health care provider about any symptoms or concerns you have about testicular or prostate cancer.  Skin Cancer  Check your skin from head to toe regularly.  Tell your health care provider about any new moles or changes in moles, especially if: ? There is a change in a mole's size, shape, or color. ? You have a mole that is larger than a pencil eraser.  Always use sunscreen. Apply sunscreen liberally and repeat throughout the day.  Protect yourself by wearing long sleeves, pants, a wide-brimmed hat, and sunglasses when outside.  What should I know about heart disease, diabetes, and high blood pressure?  If you are 6-54 years of age, have your blood  pressure checked every 3-5 years. If you are 54 years of age or older, have your blood pressure checked every year. You should have your blood pressure measured twice-once when you are at a hospital or clinic, and once when you are not at a hospital or clinic. Record the average of the two measurements. To check your blood pressure when you are not at a hospital or clinic, you can use: ? An automated blood pressure machine at a pharmacy. ? A home blood pressure monitor.  Talk to  your health care provider about your target blood pressure.  If you are between 32-73 years old, ask your health care provider if you should take aspirin to prevent heart disease.  Have regular diabetes screenings by checking your fasting blood sugar level. ? If you are at a normal weight and have a low risk for diabetes, have this test once every three years after the age of 3. ? If you are overweight and have a high risk for diabetes, consider being tested at a younger age or more often.  A one-time screening for abdominal aortic aneurysm (AAA) by ultrasound is recommended for men aged 74-75 years who are current or former smokers. What should I know about preventing infection? Hepatitis B If you have a higher risk for hepatitis B, you should be screened for this virus. Talk with your health care provider to find out if you are at risk for hepatitis B infection. Hepatitis C Blood testing is recommended for:  Everyone born from [birth year] through 1965.  Anyone with known risk factors for hepatitis C.  Sexually Transmitted Diseases (STDs)  You should be screened each year for STDs including gonorrhea and chlamydia if: ? You are sexually active and are younger than 77 years of age. ? You are older than 77 years of age and your health care provider tells you that you are at risk for this type of infection. ? Your sexual activity has changed since you were last screened and you are at an increased risk for chlamydia or gonorrhea. Ask your health care provider if you are at risk.  Talk with your health care provider about whether you are at high risk of being infected with HIV. Your health care provider may recommend a prescription medicine to help prevent HIV infection.  What else can I do?  Schedule regular health, dental, and eye exams.  Stay current with your vaccines (immunizations).  Do not use any tobacco products, such as cigarettes, chewing tobacco, and e-cigarettes. If you need  help quitting, ask your health care provider.  Limit alcohol intake to no more than 2 drinks per day. One drink equals 12 ounces of beer, 5 ounces of wine, or 1 ounces of hard liquor.  Do not use street drugs.  Do not share needles.  Ask your health care provider for help if you need support or information about quitting drugs.  Tell your health care provider if you often feel depressed.  Tell your health care provider if you have ever been abused or do not feel safe at home. This information is not intended to replace advice given to you by your health care provider. Make sure you discuss any questions you have with your health care provider. Document Released: 01/27/2008 Document Revised: 03/29/2016 Document Reviewed: 05/04/2015 Elsevier Interactive Patient Education  [employer].

## 2018-06-14 NOTE — Telephone Encounter (Signed)
Faxed medical clearance form to [place] with last office visit note.

## 2018-06-20 ENCOUNTER — Telehealth (INDEPENDENT_AMBULATORY_CARE_PROVIDER_SITE_OTHER): Payer: Self-pay | Admitting: Orthopaedic Surgery

## 2018-06-20 ENCOUNTER — Telehealth: Payer: Self-pay

## 2018-06-20 NOTE — Telephone Encounter (Signed)
Called regarding pre-operative clearance form from [employer]. He is planning on having surgery as soon as he can get clearance for surgery.

## 2018-06-20 NOTE — Telephone Encounter (Signed)
Spoke with pt who verified he was waiting on another dr from their office to review his chart and sign clearance form tomorrow. Pt said he will call to let me know

## 2018-06-20 NOTE — Telephone Encounter (Signed)
Called patient back. Chart is being reviewed by another provider. We will call him back with an answer tomorrow. He verbalized understanding.

## 2018-06-20 NOTE — Telephone Encounter (Signed)
Patient states cancer doctor is out on medical leave, and cannot sign surgery clearance form for him. The office there told him he could see another doctor to have form signed, but he cannot get an appt with another doctor until December. Please call patient to advise.

## 2018-06-24 ENCOUNTER — Telehealth: Payer: Self-pay

## 2018-06-24 NOTE — Telephone Encounter (Signed)
Returned pt's call regarding he needs the pre-op clearance - he missed a call Friday and was checking on it.  Noted it has been faxed to [facility] and I let him know they should have it.  Pt would also like to be scheduled for his CT for Dec -prefers around Dec 9th due to his upcoming appt with Dr Alvy Bimler on 12/12. I let him know the prior Josem Kaufmann is incomplete for now and I have messaged Darlena/Brandi to check on the prior auth and then it can be scheduled. No other needs per pt at this time.

## 2018-06-25 ENCOUNTER — Other Ambulatory Visit: Payer: Self-pay

## 2018-06-25 MED ORDER — METFORMIN HCL ER 500 MG PO TB24: 500.0000 mg | ORAL_TABLET | Freq: Every day | ORAL | 2 refills | Status: DC

## 2018-06-26 ENCOUNTER — Encounter (INDEPENDENT_AMBULATORY_CARE_PROVIDER_SITE_OTHER): Payer: Self-pay | Admitting: Orthopedic Surgery

## 2018-06-26 ENCOUNTER — Ambulatory Visit (INDEPENDENT_AMBULATORY_CARE_PROVIDER_SITE_OTHER): Payer: Self-pay

## 2018-06-26 ENCOUNTER — Ambulatory Visit (INDEPENDENT_AMBULATORY_CARE_PROVIDER_SITE_OTHER): Payer: Medicare Other | Admitting: Orthopedic Surgery

## 2018-06-26 VITALS — BP 130/75 | HR 80 | Ht 72.0 in | Wt 209.0 lb

## 2018-06-26 DIAGNOSIS — M1711 Unilateral primary osteoarthritis, right knee: Secondary | ICD-10-CM

## 2018-06-26 DIAGNOSIS — H05232 Hemorrhage of left orbit: Secondary | ICD-10-CM | POA: Diagnosis not present

## 2018-06-26 NOTE — H&P (Addendum)
TOTAL KNEE ADMISSION H&P  Patient is being admitted for right total knee arthroplasty.  Subjective:  Chief Complaint:right knee pain.  HPI: Marcus Taylor, 77 y.o. male, has a history of pain and functional disability in the right knee due to arthritis and has failed non-surgical conservative treatments for greater than 12 weeks to includeNSAID's and/or analgesics, corticosteriod injections, flexibility and strengthening excercises, use of assistive devices and activity modification.  Onset of symptoms was gradual, starting >10 years ago with gradually worsening course since that time. The patient noted no past surgery on the right knee(s).  Patient currently rates pain in the right knee(s) at 9 out of 10 with activity. Patient has night pain, worsening of pain with activity and weight bearing, pain that interferes with activities of daily living, pain with passive range of motion, crepitus and joint swelling.  Patient has evidence of subchondral sclerosis, periarticular osteophytes, joint subluxation and joint space narrowing by imaging studies. There is no active infection.  Patient Active Problem List   Diagnosis Date Noted  . Overweight (BMI 25.0-29.9) 05/22/2018  . Pancytopenia, acquired ([place]) 04/19/2018  . Easy bruising 04/19/2018  . Unilateral primary osteoarthritis, right knee 04/19/2018  . Decreased hearing of both ears 03/20/2018  . Splenomegaly 03/12/2018  . Enlarged prostate without lower urinary tract symptoms (luts) 03/12/2018  . Subarachnoid hematoma ([place]) 03/12/2018  . Neck pain 02/19/2018  . IDA (iron deficiency anemia) 08/09/2016  . Thrombocytopenia ([place]) 05/30/2016  . Diabetes mellitus type 2, controlled, with complications ([place]) 60/45/4098  . Right inguinal hernia 02/03/2016  . History of colon polyps 01/27/2016  . History of ST elevation myocardial infarction (STEMI) 01/27/2016  . CAD (coronary artery disease) 01/27/2016  . Hyperlipidemia 01/27/2016  . Anemia in  neoplastic disease 05/25/2014  . Splenic marginal zone b-cell lymphoma ([place]) 12/18/2011  . HTN (hypertension)    Past Medical History:  Diagnosis Date  . Anemia   . Arthritis   . Colonic polyp   . Concussion 03/05/2018  . Coronary artery disease    stents x 3 (03-2014)  . Diabetes mellitus   . Diverticulosis    throughout entire colon  . HTN (hypertension)   . Hypertriglyceridemia   . Kidney stone   . Lymphocytosis   . Metatarsal fracture left   left 5th proximal phalanx   . OSA (obstructive sleep apnea)    "Mild" by records  . PONV (postoperative nausea and vomiting)    first time, no problem since then  . RBBB 01/2016  . Splenic marginal zone b-cell lymphoma ([place]) 12/18/2011  . Splenomegaly   . STEMI (ST elevation myocardial infarction) [facility])    August 2015  . Subarachnoid hemorrhage ([place]) 03/05/2018    Past Surgical History:  Procedure Laterality Date  . CARDIAC CATHETERIZATION     stents x 3 (03/2014)  . CHOLECYSTECTOMY    . HERNIA REPAIR    . INGUINAL HERNIA REPAIR Right 05/27/2015   Procedure: LAPAROSCOPIC REPAIR RIGHT  INGUINAL HERNIA;  Surgeon: Greer Pickerel, MD;  Location: [facility];  Service: General;  Laterality: Right;  . INSERTION OF MESH Right 05/27/2015   Procedure: INSERTION OF MESH;  Surgeon: Greer Pickerel, MD;  Location: [facility];  Service: General;  Laterality: Right;  . LUMBAR DISC SURGERY    . TONSILLECTOMY AND ADENOIDECTOMY      Current Facility-Administered Medications  Medication Dose Route Frequency Provider Last Rate Last Dose  . 0.9 %  sodium chloride infusion  500 mL Intravenous Continuous Armbruster, Carlota Raspberry, MD      . [  START ON 07/09/2018] acetaminophen (OFIRMEV) IV 1,000 mg  1,000 mg Intravenous To OR Garald Balding, MD      . Derrill Memo ON 07/09/2018] tranexamic acid (CYKLOKAPRON) 2,000 mg in sodium chloride 0.9 % 50 mL Topical Application  4,742 mg Topical To OR Garald Balding, MD       Current Outpatient Medications  Medication Sig Dispense  Refill Last Dose  . aspirin EC 81 MG tablet Take 81 mg by mouth daily.   Taking  . atorvastatin (LIPITOR) 40 MG tablet Take 1 tablet (40 mg total) by mouth daily. 90 tablet 3 Taking  . clopidogrel (PLAVIX) 75 MG tablet Take 75 mg by mouth daily.   Taking  . ferrous sulfate 325 (65 FE) MG tablet Take 325 mg by mouth daily with breakfast.   Taking  . lisinopril (PRINIVIL,ZESTRIL) 30 MG tablet Take 1 tablet (30 mg total) by mouth daily. 90 tablet 1 Taking  . metFORMIN (GLUCOPHAGE-XR) 500 MG 24 hr tablet Take 1 tablet (500 mg total) by mouth daily with breakfast. TAKE 1 TABLET BY MOUTH WITH EVENING MEAL M-W-F (Patient taking differently: Take 500 mg by mouth every Monday, Wednesday, and Friday. TAKE 1 TABLET BY MOUTH WITH EVENING MEAL M-W-F) 45 tablet 2 Taking  . metoprolol tartrate (LOPRESSOR) 25 MG tablet Take 0.5 tablets (12.5 mg total) by mouth 2 (two) times daily. 90 tablet 1 Taking  . nitroGLYCERIN (NITROSTAT) 0.4 MG SL tablet Place 1 tablet (0.4 mg total) under the tongue every 5 (five) minutes as needed for chest pain. 10 tablet 0 Taking  . predniSONE (DELTASONE) 10 MG tablet Take 1 tablet (10 mg total) by mouth daily with breakfast. 30 tablet 1    No Known Allergies  Social History   Tobacco Use  . Smoking status: Former Smoker    Packs/day: 1.00    Years: 20.00    Pack years: 20.00    Last attempt to quit: 08/14/1988    Years since quitting: 29.9  . Smokeless tobacco: Never Used  Substance Use Topics  . Alcohol use: No    Family History  Problem Relation Age of Onset  . Kidney disease Sister   . Colon cancer Sister 40  . Colon cancer Brother   . Bone cancer Brother   . Arthritis Mother   . Breast cancer Mother   . Heart disease Mother   . Early death Father   . Arthritis Son   . Esophageal cancer Sister   . Bladder Cancer Sister   . Rectal cancer Neg Hx   . Stomach cancer Neg Hx      Review of Systems  Constitutional: Negative for fatigue and fever.  HENT: Negative for  ear pain.   Eyes: Negative for pain.  Respiratory: Negative for cough.   Cardiovascular: Negative for leg swelling.  Gastrointestinal: Negative for constipation and diarrhea.  Genitourinary: Negative for difficulty urinating.  Musculoskeletal: Positive for back pain. Negative for neck pain.  Skin: Negative for rash.  Allergic/Immunologic: Negative for food allergies.  Neurological: Positive for weakness. Negative for numbness.  Psychiatric/Behavioral: Positive for sleep disturbance.    Objective:  Physical Exam  Constitutional: He is oriented to person, place, and time. He appears well-developed and well-nourished.  HENT:  Head: Normocephalic and atraumatic.  Left peri-orbital ecchymosis  Eyes: Pupils are equal, round, and reactive to light. Conjunctivae and EOM are normal.  Neck: Neck supple. No thyromegaly present.  No bruits  Cardiovascular: Normal rate and intact distal pulses.  Irregular heart  beat  Respiratory: Effort normal and breath sounds normal.  GI: Soft. Bowel sounds are normal. He exhibits distension. There is no tenderness.  Neurological: He is alert and oriented to person, place, and time.  Skin: Skin is warm and dry.  Psychiatric: He has a normal mood and affect. His behavior is normal. Judgment and thought content normal.  Musculoskeletal: He does have a moderate effusion.  He lacks about 5 degrees of full extension flexes to about 110 degrees.  Does have some pseudolaxity with valgus stressing but has a good endpoint.  Calf is supple nontender.  Neurovascular intact distally.  Vital signs in last 24 hours:    Labs:   Estimated body mass index is 28.62 kg/m as calculated from the following:   Height as of 07/02/18: 6' (1.829 m).   Weight as of 07/02/18: 95.7 kg.   Imaging Review Plain radiographs demonstrate moderate degenerative joint disease of the right knee(s). The overall alignment issignificant varus. The bone quality appears to be good for age and  reported activity level.   Preoperative templating of the joint replacement has been completed, documented, and submitted to the Operating Room personnel in order to optimize intra-operative equipment management.   Anticipated LOS equal to or greater than 2 midnights due to - Age 27 and older with one or more of the following:  - Obesity  - Expected need for hospital services (PT, OT, Nursing) required for safe  discharge  - Anticipated need for postoperative skilled nursing care or inpatient rehab  - Active co-morbidities: Coronary Artery Disease, Heart Attack and Cardiac Arrhythmia OR   - Unanticipated findings during/Post Surgery: None  - Patient is a high risk of re-admission due to: None     Assessment/Plan:  End stage arthritis, right knee   The patient history, physical examination, clinical judgment of the provider and imaging studies are consistent with end stage degenerative joint disease of the right knee(s) and total knee arthroplasty is deemed medically necessary. The treatment options including medical management, injection therapy arthroscopy and arthroplasty were discussed at length. The risks and benefits of total knee arthroplasty were presented and reviewed. The risks due to aseptic loosening, infection, stiffness, patella tracking problems, thromboembolic complications and other imponderables were discussed. The patient acknowledged the explanation, agreed to proceed with the plan and consent was signed. Patient is being admitted for inpatient treatment for surgery, pain control, PT, OT, prophylactic antibiotics, VTE prophylaxis, progressive ambulation and ADL's and discharge planning. The patient is planning to be discharged home with home health services   Mike Craze. [place], [place] [phone]  07/08/2018 3:57 PM

## 2018-06-26 NOTE — Progress Notes (Signed)
Chief Complaint:right knee pain.  HPI: Marcus Taylor, 77 y.o. male, has a history of pain and functional disability in the right knee due to arthritis and has failed non-surgical conservative treatments for greater than 12 weeks to includeNSAID's and/or analgesics, corticosteriod injections, flexibility and strengthening excercises, use of assistive devices and activity modification.  Onset of symptoms was gradual, starting >10 years ago with gradually worsening course since that time. The patient noted no past surgery on the right knee(s).  Patient currently rates pain in the right knee(s) at 9 out of 10 with activity. Patient has night pain, worsening of pain with activity and weight bearing, pain that interferes with activities of daily living, pain with passive range of motion, crepitus and joint swelling.  Patient has evidence of subchondral sclerosis, periarticular osteophytes, joint subluxation and joint space narrowing by imaging studies. There is no active infection.  Patient Active Problem List   Diagnosis Date Noted  . Overweight (BMI 25.0-29.9) 05/22/2018  . Pancytopenia, acquired ([place]) 04/19/2018  . Easy bruising 04/19/2018  . Unilateral primary osteoarthritis, right knee 04/19/2018  . Decreased hearing of both ears 03/20/2018  . Splenomegaly 03/12/2018  . Enlarged prostate without lower urinary tract symptoms (luts) 03/12/2018  . Subarachnoid hematoma ([place]) 03/12/2018  . Neck pain 02/19/2018  . IDA (iron deficiency anemia) 08/09/2016  . Thrombocytopenia ([place]) 05/30/2016  . Diabetes mellitus type 2, controlled, with complications ([place]) 02/04/7627  . Right inguinal hernia 02/03/2016  . History of colon polyps 01/27/2016  . History of ST elevation myocardial infarction (STEMI) 01/27/2016  . CAD (coronary artery disease) 01/27/2016  . Hyperlipidemia 01/27/2016  . Anemia in neoplastic disease 05/25/2014  . Splenic marginal zone b-cell lymphoma ([place]) 12/18/2011  . HTN  (hypertension)    Past Medical History:  Diagnosis Date  . Anemia   . Arthritis   . Colonic polyp   . Concussion 03/05/2018  . Coronary artery disease    stents x 3 (03-2014)  . Diabetes mellitus   . Diverticulosis    throughout entire colon  . HTN (hypertension)   . Hypertriglyceridemia   . Kidney stone   . Lymphocytosis   . Metatarsal fracture left   left 5th proximal phalanx   . OSA (obstructive sleep apnea)    "Mild" by records  . PONV (postoperative nausea and vomiting)   . RBBB 01/2016  . Splenic marginal zone b-cell lymphoma ([place]) 12/18/2011  . Splenomegaly   . STEMI (ST elevation myocardial infarction) [name])    August 2015  . Subarachnoid hemorrhage ([place]) 03/05/2018    Past Surgical History:  Procedure Laterality Date  . CARDIAC CATHETERIZATION     stents x 3 (03/2014)  . CHOLECYSTECTOMY    . HERNIA REPAIR    . INGUINAL HERNIA REPAIR Right 05/27/2015   Procedure: LAPAROSCOPIC REPAIR RIGHT  INGUINAL HERNIA;  Surgeon: Greer Pickerel, MD;  [facility];  Service: General;  Laterality: Right;  . INSERTION OF MESH Right 05/27/2015   Procedure: INSERTION OF MESH;  Surgeon: Greer Pickerel, MD;  Location: [facility];  Service: General;  Laterality: Right;  . LUMBAR DISC SURGERY    . TONSILLECTOMY AND ADENOIDECTOMY      Current Facility-Administered Medications  Medication Dose Route Frequency Provider Last Rate Last Dose  . 0.9 %  sodium chloride infusion  500 mL Intravenous Continuous Armbruster, Carlota Raspberry, MD       Current Outpatient Medications  Medication Sig Dispense Refill Last Dose  . aspirin EC 81 MG tablet Take 81  mg by mouth daily.   Taking  . atorvastatin (LIPITOR) 40 MG tablet Take 1 tablet (40 mg total) by mouth daily. 90 tablet 3 Taking  . clopidogrel (PLAVIX) 75 MG tablet Take 75 mg by mouth daily.   Taking  . ferrous sulfate 325 (65 FE) MG tablet Take 325 mg by mouth daily with breakfast.   Taking  . lisinopril (PRINIVIL,ZESTRIL) 30 MG tablet Take 1 tablet (30  mg total) by mouth daily. 90 tablet 1 Taking  . metFORMIN (GLUCOPHAGE-XR) 500 MG 24 hr tablet Take 1 tablet (500 mg total) by mouth daily with breakfast. TAKE 1 TABLET BY MOUTH WITH EVENING MEAL M-W-F 45 tablet 2 Taking  . metoprolol tartrate (LOPRESSOR) 25 MG tablet Take 0.5 tablets (12.5 mg total) by mouth 2 (two) times daily. 90 tablet 1 Taking  . nitroGLYCERIN (NITROSTAT) 0.4 MG SL tablet Place 1 tablet (0.4 mg total) under the tongue every 5 (five) minutes as needed for chest pain. 10 tablet 0 Taking  . predniSONE (DELTASONE) 10 MG tablet TAKE 2 TABLETS BY MOUTH DAILY WITH BREAKFAST 60 tablet 0 Taking   No Known Allergies  Social History   Tobacco Use  . Smoking status: Former Smoker    Packs/day: 1.00    Years: 20.00    Pack years: 20.00    Last attempt to quit: 08/14/1988    Years since quitting: 29.8  . Smokeless tobacco: Never Used  Substance Use Topics  . Alcohol use: No    Family History  Problem Relation Age of Onset  . Kidney disease Sister   . Colon cancer Sister 52  . Colon cancer Brother   . Bone cancer Brother   . Arthritis Mother   . Breast cancer Mother   . Heart disease Mother   . Early death Father   . Arthritis Son   . Esophageal cancer Sister   . Bladder Cancer Sister   . Rectal cancer Neg Hx   . Stomach cancer Neg Hx      Review of Systems  Constitutional: Negative for fatigue and fever.  HENT: Negative for ear pain.   Eyes: Negative for pain.  Respiratory: Negative for cough.   Cardiovascular: Negative for leg swelling.  Gastrointestinal: Negative for constipation and diarrhea.  Genitourinary: Negative for difficulty urinating.  Musculoskeletal: Positive for back pain. Negative for neck pain.  Skin: Negative for rash.  Allergic/Immunologic: Negative for food allergies.  Neurological: Positive for weakness. Negative for numbness.  Psychiatric/Behavioral: Positive for sleep disturbance.    Objective:  Physical Exam  Constitutional: He is  oriented to person, place, and time. He appears well-developed and well-nourished.  HENT:  Head: Normocephalic and atraumatic.  Left peri-orbital ecchymosis  Eyes: Pupils are equal, round, and reactive to light. Conjunctivae and EOM are normal.  Neck: Neck supple. No thyromegaly present.  No bruits  Cardiovascular: Normal rate and intact distal pulses.  Irregular heart beat  Respiratory: Effort normal and breath sounds normal.  GI: Soft. Bowel sounds are normal. He exhibits distension. There is no tenderness.  Neurological: He is alert and oriented to person, place, and time.  Skin: Skin is warm and dry.  Psychiatric: He has a normal mood and affect. His behavior is normal. Judgment and thought content normal.  Musculoskeletal: He does have a moderate effusion.  He lacks about 5 degrees of full extension flexes to about 110 degrees.  Does have some pseudolaxity with valgus stressing but has a good endpoint.  Calf is supple nontender.  Neurovascular intact distally.  Vital signs in last 24 hours: Pulse Rate:  [80] 80 (11/13 1125) BP: (130)/(75) 130/75 (11/13 1125) Weight:  [94.8 kg] 94.8 kg (11/13 1125)  Labs:   Estimated body mass index is 28.35 kg/m as calculated from the following:   Height as of 06/26/18: 6' (1.829 m).   Weight as of 06/26/18: 94.8 kg.   Imaging Review Plain radiographs demonstrate moderate degenerative joint disease of the right knee(s). The overall alignment issignificant varus. The bone quality appears to be good for age and reported activity level.   Preoperative templating of the joint replacement has been completed, documented, and submitted to the Operating Room personnel in order to optimize intra-operative equipment management.   Anticipated LOS equal to or greater than 2 midnights due to - Age 68 and older with one or more of the following:  - Obesity  - Expected need for hospital services (PT, OT, Nursing) required for safe  discharge  -  Anticipated need for postoperative skilled nursing care or inpatient rehab  - Active co-morbidities: Coronary Artery Disease, Heart Attack and Cardiac Arrhythmia OR   - Unanticipated findings during/Post Surgery: None  - Patient is a high risk of re-admission due to: None     Assessment/Plan:  End stage arthritis, right knee   The patient history, physical examination, clinical judgment of the provider and imaging studies are consistent with end stage degenerative joint disease of the right knee(s) and total knee arthroplasty is deemed medically necessary. The treatment options including medical management, injection therapy arthroscopy and arthroplasty were discussed at length. The risks and benefits of total knee arthroplasty were presented and reviewed. The risks due to aseptic loosening, infection, stiffness, patella tracking problems, thromboembolic complications and other imponderables were discussed. The patient acknowledged the explanation, agreed to proceed with the plan and consent was signed. Patient is being admitted for inpatient treatment for surgery, pain control, PT, OT, prophylactic antibiotics, VTE prophylaxis, progressive ambulation and ADL's and discharge planning. The patient is planning to be discharged home with home health services   Face-to-face time spent with patient was greater than 40 minutes.  Greater than 50% of the time was spent in counseling and coordination of care.

## 2018-07-01 NOTE — Pre-Procedure Instructions (Signed)
LARENZ FRASIER  07/01/2018      [id number] - [place], [place] - [address] [place] [address] Phone: [phone] Fax: [phone]    Your procedure is scheduled on Tuesday November 26.  Report to [facility] Admitting at 8:15 A.M.  Call this number if you have problems the morning of surgery:  [phone]   Remember:  Do not eat or drink after midnight.    Take these medicines the morning of surgery with A SIP OF WATER:   Metoprolol (lopressor) Prednisone (deltasone)   DO NOT TAKE Metformin (glucophage) the day of surgery  7 days prior to surgery STOP taking any Aleve, Naproxen, Ibuprofen, Motrin, Advil, Goody's, BC's, all herbal medications, fish oil, and all vitamins  FOLLOW your surgeon's instructions on stopping Aspirin and Plavix (clopidogrel). If no instructions were given, please call your surgeons office.      Do not wear jewelry.  Do not wear lotions, powders, or colognes, or deodorant.  Do not shave 48 hours prior to surgery.  Men may shave face and neck.  Do not bring valuables to the hospital.  [facility] is not responsible for any belongings or valuables.  Contacts, dentures or bridgework may not be worn into surgery.  Leave your suitcase in the car.  After surgery it may be brought to your room.  For patients admitted to the hospital, discharge time will be determined by your treatment team.  Patients discharged the day of surgery will not be allowed to drive home.   Special instructions:    [place]- Preparing For Surgery  Before surgery, you can play an important role. Because skin is not sterile, your skin needs to be as free of germs as possible. You can reduce the number of germs on your skin by washing with CHG (chlorahexidine gluconate) Soap before surgery.  CHG is an antiseptic cleaner which kills germs and bonds with the skin to continue killing germs  even after washing.    Oral Hygiene is also important to reduce your risk of infection.  Remember - BRUSH YOUR TEETH THE MORNING OF SURGERY WITH YOUR REGULAR TOOTHPASTE  Please do not use if you have an allergy to CHG or antibacterial soaps. If your skin becomes reddened/irritated stop using the CHG.  Do not shave (including legs and underarms) for at least 48 hours prior to first CHG shower. It is OK to shave your face.  Please follow these instructions carefully.   1. Shower the NIGHT BEFORE SURGERY and the MORNING OF SURGERY with CHG.   2. If you chose to wash your hair, wash your hair first as usual with your normal shampoo.  3. After you shampoo, rinse your hair and body thoroughly to remove the shampoo.  4. Use CHG as you would any other liquid soap. You can apply CHG directly to the skin and wash gently with a scrungie or a clean washcloth.   5. Apply the CHG Soap to your body ONLY FROM THE NECK DOWN.  Do not use on open wounds or open sores. Avoid contact with your eyes, ears, mouth and genitals (private parts). Wash Face and genitals (private parts)  with your normal soap.  6. Wash thoroughly, paying special attention to the area where your surgery will be performed.  7. Thoroughly rinse your body with warm water from the neck down.  8. DO NOT shower/wash with your  normal soap after using and rinsing off the CHG Soap.  9. Pat yourself dry with a CLEAN TOWEL.  10. Wear CLEAN PAJAMAS to bed the night before surgery, wear comfortable clothes the morning of surgery  11. Place CLEAN SHEETS on your bed the night of your first shower and DO NOT SLEEP WITH PETS.    Day of Surgery:  Do not apply any deodorants/lotions.  Please wear clean clothes to the hospital/surgery center.   Remember to brush your teeth WITH YOUR REGULAR TOOTHPASTE.    Please read over the following fact sheets that you were given. Coughing and Deep Breathing, MRSA Information and Surgical Site Infection  Prevention

## 2018-07-02 ENCOUNTER — Encounter (HOSPITAL_COMMUNITY)
Admission: RE | Admit: 2018-07-02 | Discharge: 2018-07-02 | Disposition: A | Discharge status: Home / Self Care | Payer: Medicare Other | Source: Ambulatory Visit | Attending: Orthopaedic Surgery | Admitting: Orthopaedic Surgery

## 2018-07-02 ENCOUNTER — Telehealth (INDEPENDENT_AMBULATORY_CARE_PROVIDER_SITE_OTHER): Payer: Self-pay | Admitting: Orthopaedic Surgery

## 2018-07-02 ENCOUNTER — Other Ambulatory Visit: Payer: Self-pay

## 2018-07-02 ENCOUNTER — Encounter (HOSPITAL_COMMUNITY): Payer: Self-pay

## 2018-07-02 DIAGNOSIS — Z9049 Acquired absence of other specified parts of digestive tract: Secondary | ICD-10-CM | POA: Insufficient documentation

## 2018-07-02 DIAGNOSIS — Z79899 Other long term (current) drug therapy: Secondary | ICD-10-CM | POA: Insufficient documentation

## 2018-07-02 DIAGNOSIS — I10 Essential (primary) hypertension: Secondary | ICD-10-CM | POA: Insufficient documentation

## 2018-07-02 DIAGNOSIS — M1711 Unilateral primary osteoarthritis, right knee: Secondary | ICD-10-CM | POA: Insufficient documentation

## 2018-07-02 DIAGNOSIS — Z9889 Other specified postprocedural states: Secondary | ICD-10-CM | POA: Diagnosis not present

## 2018-07-02 DIAGNOSIS — H2513 Age-related nuclear cataract, bilateral: Secondary | ICD-10-CM | POA: Diagnosis not present

## 2018-07-02 DIAGNOSIS — G4733 Obstructive sleep apnea (adult) (pediatric): Secondary | ICD-10-CM | POA: Insufficient documentation

## 2018-07-02 DIAGNOSIS — Z7984 Long term (current) use of oral hypoglycemic drugs: Secondary | ICD-10-CM | POA: Insufficient documentation

## 2018-07-02 DIAGNOSIS — E119 Type 2 diabetes mellitus without complications: Secondary | ICD-10-CM | POA: Diagnosis not present

## 2018-07-02 DIAGNOSIS — Z7982 Long term (current) use of aspirin: Secondary | ICD-10-CM | POA: Insufficient documentation

## 2018-07-02 DIAGNOSIS — Z01812 Encounter for preprocedural laboratory examination: Secondary | ICD-10-CM | POA: Insufficient documentation

## 2018-07-02 LAB — URINALYSIS, ROUTINE W REFLEX MICROSCOPIC
BILIRUBIN URINE: NEGATIVE
Glucose, UA: NEGATIVE mg/dL
Ketones, ur: NEGATIVE mg/dL
Nitrite: NEGATIVE
Protein, ur: NEGATIVE mg/dL
SPECIFIC GRAVITY, URINE: 1.017 (ref 1.005–1.030)
pH: 5 (ref 5.0–8.0)

## 2018-07-02 LAB — COMPREHENSIVE METABOLIC PANEL
ALT: 22 U/L (ref 0–44)
AST: 35 U/L (ref 15–41)
Albumin: 4 g/dL (ref 3.5–5.0)
Alkaline Phosphatase: 56 U/L (ref 38–126)
Anion gap: 9 (ref 5–15)
BUN: 21 mg/dL (ref 8–23)
CHLORIDE: 108 mmol/L (ref 98–111)
CO2: 24 mmol/L (ref 22–32)
CREATININE: 0.9 mg/dL (ref 0.61–1.24)
Calcium: 9.2 mg/dL (ref 8.9–10.3)
Glucose, Bld: 122 mg/dL — ABNORMAL HIGH (ref 70–99)
POTASSIUM: 4.2 mmol/L (ref 3.5–5.1)
Sodium: 141 mmol/L (ref 135–145)
Total Bilirubin: 0.4 mg/dL (ref 0.3–1.2)
Total Protein: 6.3 g/dL — ABNORMAL LOW (ref 6.5–8.1)

## 2018-07-02 LAB — CBC WITH DIFFERENTIAL/PLATELET
Abs Immature Granulocytes: 0.06 10*3/uL (ref 0.00–0.07)
BASOS PCT: 1 %
Basophils Absolute: 0.1 10*3/uL (ref 0.0–0.1)
EOS PCT: 1 %
Eosinophils Absolute: 0.1 10*3/uL (ref 0.0–0.5)
HCT: 43.1 % (ref 39.0–52.0)
Hemoglobin: 13.3 g/dL (ref 13.0–17.0)
Immature Granulocytes: 0 %
Lymphocytes Relative: 70 %
Lymphs Abs: 15.1 10*3/uL — ABNORMAL HIGH (ref 0.7–4.0)
MCH: 28 pg (ref 26.0–34.0)
MCHC: 30.9 g/dL (ref 30.0–36.0)
MCV: 90.7 fL (ref 80.0–100.0)
MONO ABS: 1.5 10*3/uL — AB (ref 0.1–1.0)
MONOS PCT: 7 %
Neutro Abs: 4.6 10*3/uL (ref 1.7–7.7)
Neutrophils Relative %: 21 %
PLATELETS: 165 10*3/uL (ref 150–400)
RBC: 4.75 MIL/uL (ref 4.22–5.81)
RDW: 16.1 % — ABNORMAL HIGH (ref 11.5–15.5)
WBC: 21.4 10*3/uL — ABNORMAL HIGH (ref 4.0–10.5)
nRBC: 0 % (ref 0.0–0.2)

## 2018-07-02 LAB — TYPE AND SCREEN
ABO/RH(D): A POS
ANTIBODY SCREEN: NEGATIVE

## 2018-07-02 LAB — SURGICAL PCR SCREEN
MRSA, PCR: NEGATIVE
STAPHYLOCOCCUS AUREUS: NEGATIVE

## 2018-07-02 LAB — PROTIME-INR
INR: 1.08
PROTHROMBIN TIME: 13.9 s (ref 11.4–15.2)

## 2018-07-02 LAB — GLUCOSE, CAPILLARY: GLUCOSE-CAPILLARY: 105 mg/dL — AB (ref 70–99)

## 2018-07-02 LAB — APTT: APTT: 33 s (ref 24–36)

## 2018-07-02 NOTE — Progress Notes (Signed)
   How to Manage Your Diabetes Before and After Surgery  Why is it important to control my blood sugar before and after surgery? . Improving blood sugar levels before and after surgery helps healing and can limit problems. . A way of improving blood sugar control is eating a healthy diet by: o  Eating less sugar and carbohydrates o  Increasing activity/exercise o  Talking with your doctor about reaching your blood sugar goals . High blood sugars (greater than 180 mg/dL) can raise your risk of infections and slow your recovery, so you will need to focus on controlling your diabetes during the weeks before surgery. . Make sure that the doctor who takes care of your diabetes knows about your planned surgery including the date and location.  How do I manage my blood sugar before surgery? . Check your blood sugar at least 4 times a day, starting 2 days before surgery, to make sure that the level is not too high or low. o Check your blood sugar the morning of your surgery when you wake up and every 2 hours until you get to the Short Stay unit. . If your blood sugar is less than 70 mg/dL, you will need to treat for low blood sugar: o Do not take insulin. o Treat a low blood sugar (less than 70 mg/dL) with  cup of clear juice (cranberry or apple), 4 glucose tablets, OR glucose gel. Recheck blood sugar in 15 minutes after treatment (to make sure it is greater than 70 mg/dL). If your blood sugar is not greater than 70 mg/dL on recheck, call [phone] o  for further instructions. . Report your blood sugar to the short stay nurse when you get to Short Stay.  . If you are admitted to the hospital after surgery: o Your blood sugar will be checked by the staff and you will probably be given insulin after surgery (instead of oral diabetes medicines) to make sure you have good blood sugar levels. o The goal for blood sugar control after surgery is 80-180 mg/dL.           

## 2018-07-02 NOTE — Telephone Encounter (Signed)
FYI: Patient's wife dropped off Release form for [employer], along with $25.00 cash fee for form to be completed. 07/02/2018 at 8:50am.

## 2018-07-02 NOTE — Progress Notes (Signed)
PCP - Raoul Pitch Cardiologist -[name]- in care everywhere   Chest x-ray - 03/05/18 EKG - 03/05/18 Stress Test - ? ECHO - 03/06/18 Cardiac Cath - 03/15/14- care everywhere  Fasting Blood Sugar - pt and wife do not know typical levels of blood sugar, but pt states it is never 200  Blood Thinner Instructions: pt states he was told by Dr. Wonda Horner office to call his cardiologist about whether to stop taking Plavix and ASA. Note in care everywhere where pt did call 06/28/18, pt states he has not been given response yet so he stopping his plavix this weekend (06/30/18), pt still taking ASA. Pt encouraged to call his cardiologist again and Dr. Rudene Anda office to clarify this.   Anesthesia review: hx CAD  Patient denies shortness of breath, fever, cough and chest pain at PAT appointment   Patient verbalized understanding of instructions that were given to them at the PAT appointment. Patient was also instructed that they will need to review over the PAT instructions again at home before surgery.

## 2018-07-03 ENCOUNTER — Ambulatory Visit (INDEPENDENT_AMBULATORY_CARE_PROVIDER_SITE_OTHER): Payer: Medicare Other | Admitting: Orthopedic Surgery

## 2018-07-03 LAB — URINE CULTURE: CULTURE: NO GROWTH

## 2018-07-03 NOTE — Progress Notes (Signed)
Anesthesia Chart Review:  Case:  [id number] Date/Time:  07/09/18 1000   Procedure:  RIGHT TOTAL KNEE ARTHROPLASTY (Right )   Anesthesia type:  Choice   Pre-op diagnosis:  right knee osteoarthritis   Location:  [facility] OR ROOM 07 / [facility]   Surgeon:  Garald Balding, MD      DISCUSSION: Patient is a 77 year old male scheduled for the above procedure.   History includes former smoker (quit '90), post-operative N/V, DM2, HTN, CAD (inferior STEMI s/p DES distal RCA, proximal RCA with 3rd DES proximal RCA for 60% non-obstructive dissection 03/15/14), OSA, splenic marginal zone B-cell lymphoma (diagnosed 11/2011 with observation, iron therapy 05/2016, prednisone 10 mg for lymphocytosis and splenomegaly 03/14/18, decreased to 10 mg 04/19/18), fall with  concussion/SAH 03/05/18.  - Admission 03/05/18-03/06/18 for mechanical fall with concussion and SAH without LOC. Known bradycardia (but by notes has decided to stay on metoprolol after discussion with his cardiologist due to his tendency to develop tachycardia if stopped). During echo noted to have 3:2 wenckebach. LVEF 60-65%. Follow-up head CT showed regression (to trace), but no resolution of SAH. ASA and Plavix held for one week at discharge.   - Patient has medical clearance by Dr. Raoul Pitch for procedure.  - Patient was seen by his oncologist Dr. Alvy Bimler in October. She is currently on medical leave (until 07/12/18), but Sandi Mealy, PA-C signed clearance for surgery from a HEM-ONC standpoint with recommendation for preoperative CBC. I routed CBC results (WBC 21K) to Sandi Mealy to confirm if any additional recommendations. Recommendation for post-operative CBC and keep December follow-up as scheduled--sooner if any issues with fever, bleeding, etc. HEM-ONC is available for consult while patient admitted if needed. I notified Debbie at Dr. Rudene Anda office of elevated WBC and also routed her, Dr. Durward Fortes, and Angelene Giovanni, PA-C [facility] staff message with  recommendations.  - Patient has cardiology clearance by Dr. [name]. Patient takes ASA and Plavix and has reached out to Dr. [name] for specific perioperative instructions. He reported holding Plavix starting 06/30/18.   He denied SOB, chest pain, cough, and fever at PAT. Based on currently available information, I would anticipate that he can proceed as planned if no acute changes.   VS: BP (!) 157/80   Pulse 61   Temp 36.6 C (Oral)   Resp 18   Ht 6' (1.829 m)   Wt 95.7 kg   SpO2 100%   BMI 28.62 kg/m    PROVIDERS: Kuneff, Renee A, DO is PCP. Last visit 06/14/18. She cleared patient for surgery from a medical standpoint (see Media tab), but also recommended surgical clearances from cardiology and oncology.  [name], MD is cardiologist ([place]). Last visit 11/29/17. Alvy Bimler, Ernst Spell, MD is HEM-ONC. Last visit 05/24/18. She continued him on prednisone, because lymphocytosis increased when dose decreased. Repeat CT imaging is planned in December (07/22/18).    LABS: Preoperative labs noted. WBC 21.4, up from 16.7 last month. H/H and PLT count WNL. LFTs and PT/PTT WNL. A1c 6.0 on 05/20/18. (all labs ordered are listed, but only abnormal results are displayed)  Labs Reviewed  GLUCOSE, CAPILLARY - Abnormal; Notable for the following components:      Result Value   Glucose-Capillary 105 (*)    All other components within normal limits  CBC WITH DIFFERENTIAL/PLATELET - Abnormal; Notable for the following components:   WBC 21.4 (*)    RDW 16.1 (*)    Lymphs Abs 15.1 (*)  Monocytes Absolute 1.5 (*)    All other components within normal limits  COMPREHENSIVE METABOLIC PANEL - Abnormal; Notable for the following components:   Glucose, Bld 122 (*)    Total Protein 6.3 (*)    All other components within normal limits  URINALYSIS, ROUTINE W REFLEX MICROSCOPIC - Abnormal; Notable for the following components:   Hgb urine dipstick MODERATE (*)    Leukocytes, UA  MODERATE (*)    Bacteria, UA RARE (*)    All other components within normal limits  URINE CULTURE  SURGICAL PCR SCREEN  APTT  PROTIME-INR  TYPE AND SCREEN   CBC Latest Ref Rng & Units 07/02/2018 05/23/2018 04/18/2018  WBC 4.0 - 10.5 K/uL 21.4(H) 16.7(H) 13.8(H)  Hemoglobin 13.0 - 17.0 g/dL 13.3 13.0 12.5(L)  Hematocrit 39.0 - 52.0 % 43.1 40.3 38.8  Platelets 150 - 400 K/uL 165 147(L) 130(L)    IMAGES: CT head w/o contrast 03/06/18: IMPRESSION: 1. Regressed but not fully resolved trace posttraumatic subarachnoid hemorrhage within the right cingulate sulcus. 2. No new intracranial abnormality.  CXR 03/05/18: IMPRESSION: No evidence of active disease.  CT abd/pelvis 03/05/18: IMPRESSION: - No acute abnormality in the abdomen or pelvis. - Splenomegaly without evidence for a significant splenic laceration or hemoperitoneum. There is trace stranding or fluid along the caudal aspect of the spleen but the chronicity is unknown. Study has limitations from motion artifact and lack of intravenous contrast. - Prostate enlargement.   EKG: 03/05/18: SR, PAC, right BBB.    CV: Echo 03/06/18: Study Conclusions - Left ventricle: The cavity size was normal. Wall thickness was   increased in a pattern of mild LVH. There was mild concentric   hypertrophy. Systolic function was normal. The estimated ejection   fraction was in the range of 60% to 65%. Wall motion was normal;   there were no regional wall motion abnormalities. The study is   not technically sufficient to allow evaluation of LV diastolic   function. - Aortic valve: There was mild regurgitation. - Left atrium: The atrium was mildly dilated. - Atrial septum: No defect or patent foramen ovale was identified. - Impressions: Patient appeared to have 3:2 wenkebach during exam. Impressions: - Patient appeared to have 3:2 wenkebach during exam.  Cardiac cath 03/15/14 [facility] Care Everywhere): CORONARY ANGIOGRAPHY: Vessel Segment     Vessel Type    Stenosis (%) Description  2nd Diagonal           Native             40                        Mid Circumflex            Native             50                        Proximal Circumflex   Native             50                        1st Diagonal               Native             50  Mid LAD                      Native             70                        Distal RCA                  Native             99               Ectatic   Proximal RCA             Native             60               Ectatic   [place]amus Present: No Coronary Dominance:  Right Conclusion: Patient with failed thrombolytic therapy of inferior STEMI transferred to cath lab for rescue PCI. Right brachiocephalic to ascending aorta had very sharp angle that made catheter  manuvers difficult. There were borderline lesions in LAD and CX. Culprit vessel was subtotaled long diffuse distal RCA with TIMI-1 flow. RCA very tortuous but occlusion was crossed and dilated. Distal RCA treated with implantation of 3.0/28 mm Promus Premier stent and additonal 3.0/12 Promus in an angled stenosis proximal to the 28 mm stent. TIMI-3 flow restored. There was a 60% non-obstructive dissection of proximal RCA due to either steering wire or guide catheter. This was stabilized with a 3.5/24 mm Promus stent. TIMI-3 flow throughout. LVEDP 29 mm with no Ao gradient. He was in predominant sinus rhythm during procedure but had episodes of bradycardia followed by APC and self-limited tachycardia.   Past Medical History:  Diagnosis Date  . Anemia   . Arthritis   . Colonic polyp   . Concussion 03/05/2018  . Coronary artery disease    stents x 3 (03-2014)  . Diabetes mellitus   . Diverticulosis    throughout entire colon  . HTN (hypertension)   . Hypertriglyceridemia   . Kidney stone   . Lymphocytosis   . Metatarsal fracture left   left 5th proximal phalanx   . OSA (obstructive sleep apnea)    "Mild" by records  . PONV  (postoperative nausea and vomiting)    first time, no problem since then  . RBBB 01/2016  . Splenic marginal zone b-cell lymphoma ([place]) 12/18/2011  . Splenomegaly   . STEMI (ST elevation myocardial infarction) [facility])    August 2015  . Subarachnoid hemorrhage ([place]) 03/05/2018    Past Surgical History:  Procedure Laterality Date  . CARDIAC CATHETERIZATION     stents x 3 (03/2014)  . CHOLECYSTECTOMY    . HERNIA REPAIR    . INGUINAL HERNIA REPAIR Right 05/27/2015   Procedure: LAPAROSCOPIC REPAIR RIGHT  INGUINAL HERNIA;  Surgeon: Greer Pickerel, MD;  Location: [facility] ORS;  Service: General;  Laterality: Right;  . INSERTION OF MESH Right 05/27/2015   Procedure: INSERTION OF MESH;  Surgeon: Greer Pickerel, MD;  Location: [facility] ORS;  Service: General;  Laterality: Right;  . LUMBAR DISC SURGERY    . TONSILLECTOMY AND ADENOIDECTOMY      MEDICATIONS: . aspirin EC 81 MG tablet  . atorvastatin (LIPITOR) 40 MG tablet  . clopidogrel (PLAVIX) 75 MG tablet  . ferrous sulfate 325 (65 FE) MG tablet  . lisinopril (PRINIVIL,ZESTRIL) 30 MG tablet  . metFORMIN (GLUCOPHAGE-XR)  500 MG 24 hr tablet  . metoprolol tartrate (LOPRESSOR) 25 MG tablet  . nitroGLYCERIN (NITROSTAT) 0.4 MG SL tablet  . predniSONE (DELTASONE) 10 MG tablet   . 0.9 %  sodium chloride infusion    George [facility] Short Stay Center/Anesthesiology Phone [phone] 07/03/2018 3:11 PM

## 2018-07-03 NOTE — Anesthesia Preprocedure Evaluation (Addendum)
Anesthesia Evaluation  Patient identified by MRN, date of birth, ID band Patient awake    Reviewed: Allergy & Precautions, NPO status , Patient's Chart, lab work & pertinent test results  History of Anesthesia Complications (+) PONV and history of anesthetic complications  Airway Mallampati: II  TM Distance: >3 FB Neck ROM: Full    Dental no notable dental hx. (+) Edentulous Upper, Partial Lower   Pulmonary sleep apnea , former smoker,    Pulmonary exam normal breath sounds clear to auscultation       Cardiovascular hypertension, + CAD, + Past MI and + Cardiac Stents (DES distal RCA, proximal RCA with 3rd DES proximal RCA for 60% non-obstructive dissection 03/15/14)  Normal cardiovascular exam Rhythm:Regular Rate:Normal  EKG: 03/05/18: SR, PAC, right BBB.   Echo 03/06/18: - Left ventricle: The cavity size was normal. Wall thickness wasincreased in a pattern of mild LVH. There was mild concentric hypertrophy. Systolic function was normal. The estimated ejectionfraction was in the range of 60% to 65%. Wall motion was normal;there were no regional wall motion abnormalities. - Aortic valve: There was mild regurgitation. - Left atrium: The atrium was mildly dilated. - Atrial septum: No defect or patent foramen ovale was identified. - Impressions: Patient appeared to have 3:2 wenkebach during exam.  Cardiac cath 03/15/14 ([place]): Conclusion: Patient with failed thrombolytic therapy of inferior STEMI transferred to cath lab for rescue PCI. Right brachiocephalic to ascending aorta had very sharp angle that made catheter  manuvers difficult. There were borderline lesions in LAD and CX. Culprit vessel was subtotaled long diffuse distal RCA with TIMI-1 flow. RCA very tortuous but occlusion was crossed and dilated. Distal RCA treated with implantation of 3.0/28 mm Promus Premier stent and additonal 3.0/12 Promus in an angled stenosis  proximal to the 28 mm stent. TIMI-3 flow restored. There was a 60% non-obstructive dissection of proximal RCA due to either steering wire or guide catheter. This was stabilized with a 3.5/24 mm Promus stent. TIMI-3 flow throughout. LVEDP 29 mm with no Ao gradient. He was in predominant sinus rhythm during procedure but had episodes of bradycardia followed by APC and self-limited tachycardia.   Neuro/Psych Mechanical fall 02/2018 c/b SAH without full resolution negative neurological ROS  negative psych ROS   GI/Hepatic negative GI ROS, Neg liver ROS,   Endo/Other  negative endocrine ROSdiabetes, Type 2  Renal/GU negative Renal ROS  negative genitourinary   Musculoskeletal  (+) Arthritis , Osteoarthritis,    Abdominal   Peds  Hematology negative hematology ROS (+) Splenic marginal zone B cell lymphoma on 10mg  prednisone QD   Anesthesia Other Findings On asa, plavix (on hold since 06/30/18)  Reproductive/Obstetrics                           Anesthesia Physical Anesthesia Plan  ASA: III  Anesthesia Plan: Spinal and Regional   Post-op Pain Management:  Regional for Post-op pain   Induction: Intravenous  PONV Risk Score and Plan: 2 and Treatment may vary due to age or medical condition, Propofol infusion and Ondansetron  Airway Management Planned: Natural Airway  Additional Equipment:   Intra-op Plan:   Post-operative Plan:   Informed Consent: I have reviewed the patients History and Physical, chart, labs and discussed the procedure including the risks, benefits and alternatives for the proposed anesthesia with the patient or authorized representative who has indicated his/her understanding and acceptance.   Dental advisory given  Plan Discussed with:  CRNA  Anesthesia Plan Comments: (PAT note written 07/03/2018 by Myra Gianotti, PA-C. Patient has preoperative input from primary care, cardiology, oncology. )       Anesthesia Quick  Evaluation

## 2018-07-06 ENCOUNTER — Other Ambulatory Visit: Payer: Self-pay | Admitting: Hematology and Oncology

## 2018-07-08 MED ORDER — ACETAMINOPHEN 10 MG/ML IV SOLN
1000.0000 mg | INTRAVENOUS | Status: AC
  Administered 2018-07-09: 1000 mg via INTRAVENOUS
  Filled 2018-07-08: qty 100

## 2018-07-08 MED ORDER — TRANEXAMIC ACID 1000 MG/10ML IV SOLN
2000.0000 mg | INTRAVENOUS | Status: AC
  Administered 2018-07-09: 2000 mg via TOPICAL
  Filled 2018-07-08: qty 20

## 2018-07-08 NOTE — Telephone Encounter (Signed)
Per Dr Calton Dach note, patient is taking 10 mg daily

## 2018-07-09 ENCOUNTER — Other Ambulatory Visit: Payer: Self-pay

## 2018-07-09 ENCOUNTER — Ambulatory Visit (HOSPITAL_COMMUNITY): Payer: Medicare Other | Admitting: Vascular Surgery

## 2018-07-09 ENCOUNTER — Observation Stay (HOSPITAL_COMMUNITY)
Admission: AD | Admit: 2018-07-09 | Discharge: 2018-07-10 | Disposition: A | Discharge status: Home / Self Care | Payer: Medicare Other | Source: Ambulatory Visit | Attending: Orthopaedic Surgery | Admitting: Orthopaedic Surgery

## 2018-07-09 ENCOUNTER — Encounter (HOSPITAL_COMMUNITY): Payer: Self-pay

## 2018-07-09 ENCOUNTER — Ambulatory Visit (HOSPITAL_COMMUNITY): Payer: Medicare Other | Admitting: Certified Registered"

## 2018-07-09 ENCOUNTER — Encounter (HOSPITAL_COMMUNITY)
Admission: AD | Disposition: A | Discharge status: Home / Self Care | Payer: Self-pay | Source: Ambulatory Visit | Attending: Orthopaedic Surgery

## 2018-07-09 DIAGNOSIS — Z7984 Long term (current) use of oral hypoglycemic drugs: Secondary | ICD-10-CM | POA: Insufficient documentation

## 2018-07-09 DIAGNOSIS — D62 Acute posthemorrhagic anemia: Secondary | ICD-10-CM | POA: Diagnosis not present

## 2018-07-09 DIAGNOSIS — E119 Type 2 diabetes mellitus without complications: Secondary | ICD-10-CM | POA: Diagnosis not present

## 2018-07-09 DIAGNOSIS — I252 Old myocardial infarction: Secondary | ICD-10-CM | POA: Diagnosis not present

## 2018-07-09 DIAGNOSIS — I10 Essential (primary) hypertension: Secondary | ICD-10-CM | POA: Insufficient documentation

## 2018-07-09 DIAGNOSIS — Z7982 Long term (current) use of aspirin: Secondary | ICD-10-CM | POA: Insufficient documentation

## 2018-07-09 DIAGNOSIS — Z79899 Other long term (current) drug therapy: Secondary | ICD-10-CM | POA: Insufficient documentation

## 2018-07-09 DIAGNOSIS — I2510 Atherosclerotic heart disease of native coronary artery without angina pectoris: Secondary | ICD-10-CM | POA: Insufficient documentation

## 2018-07-09 DIAGNOSIS — C9111 Chronic lymphocytic leukemia of B-cell type in remission: Secondary | ICD-10-CM | POA: Insufficient documentation

## 2018-07-09 DIAGNOSIS — M1711 Unilateral primary osteoarthritis, right knee: Principal | ICD-10-CM

## 2018-07-09 DIAGNOSIS — G8918 Other acute postprocedural pain: Secondary | ICD-10-CM | POA: Diagnosis not present

## 2018-07-09 DIAGNOSIS — Z87891 Personal history of nicotine dependence: Secondary | ICD-10-CM | POA: Insufficient documentation

## 2018-07-09 DIAGNOSIS — Z7902 Long term (current) use of antithrombotics/antiplatelets: Secondary | ICD-10-CM | POA: Insufficient documentation

## 2018-07-09 DIAGNOSIS — Z96651 Presence of right artificial knee joint: Secondary | ICD-10-CM

## 2018-07-09 DIAGNOSIS — M67861 Other specified disorders of synovium, right knee: Secondary | ICD-10-CM | POA: Diagnosis not present

## 2018-07-09 HISTORY — PX: TOTAL KNEE ARTHROPLASTY: SHX125

## 2018-07-09 HISTORY — DX: Presence of right artificial knee joint: Z96.651

## 2018-07-09 LAB — GLUCOSE, CAPILLARY
GLUCOSE-CAPILLARY: 121 mg/dL — AB (ref 70–99)
Glucose-Capillary: 103 mg/dL — ABNORMAL HIGH (ref 70–99)
Glucose-Capillary: 115 mg/dL — ABNORMAL HIGH (ref 70–99)
Glucose-Capillary: 113 mg/dL — ABNORMAL HIGH (ref 70–99)

## 2018-07-09 LAB — HEMOGLOBIN A1C
HEMOGLOBIN A1C: 6.2 % — AB (ref 4.8–5.6)
Mean Plasma Glucose: 131.24 mg/dL

## 2018-07-09 SURGERY — ARTHROPLASTY, KNEE, TOTAL
Anesthesia: Regional | Site: Knee | Laterality: Right

## 2018-07-09 MED ORDER — OXYCODONE HCL 5 MG PO TABS
5.0000 mg | ORAL_TABLET | ORAL | Status: DC | PRN
  Administered 2018-07-09 (×2): 5 mg via ORAL
  Filled 2018-07-09: qty 1

## 2018-07-09 MED ORDER — LISINOPRIL 20 MG PO TABS
30.0000 mg | ORAL_TABLET | Freq: Every day | ORAL | Status: DC
  Administered 2018-07-10: 30 mg via ORAL
  Filled 2018-07-09: qty 1

## 2018-07-09 MED ORDER — VANCOMYCIN HCL 1000 MG IV SOLR
INTRAVENOUS | Status: DC | PRN
  Administered 2018-07-09 (×2): 1000 mg

## 2018-07-09 MED ORDER — CEFAZOLIN SODIUM-DEXTROSE 2-4 GM/100ML-% IV SOLN
2.0000 g | INTRAVENOUS | Status: AC
  Administered 2018-07-09: 2 g via INTRAVENOUS
  Filled 2018-07-09: qty 100

## 2018-07-09 MED ORDER — MAGNESIUM CITRATE PO SOLN: 1.0000 | Freq: Once | ORAL | Status: DC | PRN

## 2018-07-09 MED ORDER — BUPIVACAINE-EPINEPHRINE 0.25% -1:200000 IJ SOLN
INTRAMUSCULAR | Status: DC | PRN
  Administered 2018-07-09: 50 mL

## 2018-07-09 MED ORDER — FENTANYL CITRATE (PF) 100 MCG/2ML IJ SOLN: 25.0000 ug | INTRAMUSCULAR | Status: DC | PRN

## 2018-07-09 MED ORDER — SODIUM CHLORIDE 0.9 % IV SOLN
INTRAVENOUS | Status: DC | PRN
  Administered 2018-07-09: 15 ug/min via INTRAVENOUS

## 2018-07-09 MED ORDER — BUPIVACAINE-EPINEPHRINE 0.25% -1:200000 IJ SOLN
INTRAMUSCULAR | Status: AC
  Filled 2018-07-09: qty 1

## 2018-07-09 MED ORDER — CLOPIDOGREL BISULFATE 75 MG PO TABS: 75.0000 mg | ORAL_TABLET | Freq: Every day | ORAL | Status: DC

## 2018-07-09 MED ORDER — PROPOFOL 1000 MG/100ML IV EMUL
INTRAVENOUS | Status: AC
  Filled 2018-07-09: qty 100

## 2018-07-09 MED ORDER — PROPOFOL 10 MG/ML IV BOLUS
INTRAVENOUS | Status: AC
  Filled 2018-07-09: qty 20

## 2018-07-09 MED ORDER — FENTANYL CITRATE (PF) 250 MCG/5ML IJ SOLN
INTRAMUSCULAR | Status: AC
  Filled 2018-07-09: qty 5

## 2018-07-09 MED ORDER — ACETAMINOPHEN 10 MG/ML IV SOLN
1000.0000 mg | Freq: Four times a day (QID) | INTRAVENOUS | Status: DC
  Administered 2018-07-09 – 2018-07-10 (×3): 1000 mg via INTRAVENOUS
  Filled 2018-07-09 (×5): qty 100

## 2018-07-09 MED ORDER — ALUM & MAG HYDROXIDE-SIMETH 200-200-20 MG/5ML PO SUSP: 30.0000 mL | ORAL | Status: DC | PRN

## 2018-07-09 MED ORDER — DIPHENHYDRAMINE HCL 12.5 MG/5ML PO ELIX: 12.5000 mg | ORAL_SOLUTION | ORAL | Status: DC | PRN

## 2018-07-09 MED ORDER — FENTANYL CITRATE (PF) 100 MCG/2ML IJ SOLN
50.0000 ug | Freq: Once | INTRAMUSCULAR | Status: AC
  Administered 2018-07-09: 50 ug via INTRAVENOUS

## 2018-07-09 MED ORDER — OXYCODONE HCL 5 MG PO TABS
5.0000 mg | ORAL_TABLET | ORAL | Status: DC | PRN
  Administered 2018-07-10: 10 mg via ORAL
  Filled 2018-07-09 (×2): qty 2

## 2018-07-09 MED ORDER — HYDROMORPHONE HCL 1 MG/ML IJ SOLN: 0.5000 mg | INTRAMUSCULAR | Status: DC | PRN

## 2018-07-09 MED ORDER — PHENYLEPHRINE 40 MCG/ML (10ML) SYRINGE FOR IV PUSH (FOR BLOOD PRESSURE SUPPORT)
PREFILLED_SYRINGE | INTRAVENOUS | Status: AC
  Filled 2018-07-09: qty 10

## 2018-07-09 MED ORDER — ATORVASTATIN CALCIUM 40 MG PO TABS
40.0000 mg | ORAL_TABLET | Freq: Every day | ORAL | Status: DC
  Administered 2018-07-10: 40 mg via ORAL
  Filled 2018-07-09: qty 1

## 2018-07-09 MED ORDER — SODIUM CHLORIDE 0.9 % IR SOLN
Status: DC | PRN
  Administered 2018-07-09: 3000 mL

## 2018-07-09 MED ORDER — BISACODYL 10 MG RE SUPP: 10.0000 mg | Freq: Every day | RECTAL | Status: DC | PRN

## 2018-07-09 MED ORDER — MAGNESIUM HYDROXIDE 400 MG/5ML PO SUSP: 30.0000 mL | Freq: Every day | ORAL | Status: DC | PRN

## 2018-07-09 MED ORDER — CEFAZOLIN SODIUM-DEXTROSE 2-4 GM/100ML-% IV SOLN
2.0000 g | Freq: Four times a day (QID) | INTRAVENOUS | Status: DC
  Administered 2018-07-09 – 2018-07-10 (×4): 2 g via INTRAVENOUS
  Filled 2018-07-09 (×5): qty 100

## 2018-07-09 MED ORDER — LACTATED RINGERS IV SOLN
INTRAVENOUS | Status: DC
  Administered 2018-07-09 (×3): via INTRAVENOUS

## 2018-07-09 MED ORDER — VANCOMYCIN HCL 1000 MG IV SOLR
INTRAVENOUS | Status: AC
  Filled 2018-07-09: qty 1000

## 2018-07-09 MED ORDER — BUPIVACAINE IN DEXTROSE 0.75-8.25 % IT SOLN
INTRATHECAL | Status: DC | PRN
  Administered 2018-07-09: 1.6 mL via INTRATHECAL

## 2018-07-09 MED ORDER — METOCLOPRAMIDE HCL 5 MG PO TABS: 5.0000 mg | ORAL_TABLET | Freq: Three times a day (TID) | ORAL | Status: DC | PRN

## 2018-07-09 MED ORDER — CHLORHEXIDINE GLUCONATE 4 % EX LIQD: 60.0000 mL | Freq: Once | CUTANEOUS | Status: DC

## 2018-07-09 MED ORDER — ONDANSETRON HCL 4 MG PO TABS: 4.0000 mg | ORAL_TABLET | Freq: Four times a day (QID) | ORAL | Status: DC | PRN

## 2018-07-09 MED ORDER — INSULIN ASPART 100 UNIT/ML ~~LOC~~ SOLN: 0.0000 [IU] | Freq: Every day | SUBCUTANEOUS | Status: DC

## 2018-07-09 MED ORDER — FENTANYL CITRATE (PF) 100 MCG/2ML IJ SOLN
INTRAMUSCULAR | Status: AC
  Administered 2018-07-09: 50 ug via INTRAVENOUS
  Filled 2018-07-09: qty 2

## 2018-07-09 MED ORDER — ONDANSETRON HCL 4 MG/2ML IJ SOLN
INTRAMUSCULAR | Status: AC
  Filled 2018-07-09: qty 2

## 2018-07-09 MED ORDER — ROPIVACAINE HCL 7.5 MG/ML IJ SOLN
INTRAMUSCULAR | Status: DC | PRN
  Administered 2018-07-09: 20 mL via PERINEURAL

## 2018-07-09 MED ORDER — MENTHOL 3 MG MT LOZG: 1.0000 | LOZENGE | OROMUCOSAL | Status: DC | PRN

## 2018-07-09 MED ORDER — PROPOFOL 500 MG/50ML IV EMUL
INTRAVENOUS | Status: DC | PRN
  Administered 2018-07-09: 50 ug/kg/min via INTRAVENOUS

## 2018-07-09 MED ORDER — FERROUS SULFATE 325 (65 FE) MG PO TABS
325.0000 mg | ORAL_TABLET | Freq: Every day | ORAL | Status: DC
  Administered 2018-07-10: 325 mg via ORAL
  Filled 2018-07-09: qty 1

## 2018-07-09 MED ORDER — ASPIRIN EC 81 MG PO TBEC
81.0000 mg | DELAYED_RELEASE_TABLET | Freq: Every day | ORAL | Status: DC
  Administered 2018-07-10: 81 mg via ORAL
  Filled 2018-07-09: qty 1

## 2018-07-09 MED ORDER — METOPROLOL TARTRATE 12.5 MG HALF TABLET
12.5000 mg | ORAL_TABLET | Freq: Two times a day (BID) | ORAL | Status: DC
  Administered 2018-07-10: 12.5 mg via ORAL
  Filled 2018-07-09 (×2): qty 1

## 2018-07-09 MED ORDER — 0.9 % SODIUM CHLORIDE (POUR BTL) OPTIME
TOPICAL | Status: DC | PRN
  Administered 2018-07-09: 1000 mL

## 2018-07-09 MED ORDER — CEFAZOLIN SODIUM-DEXTROSE 2-4 GM/100ML-% IV SOLN
INTRAVENOUS | Status: AC
  Filled 2018-07-09: qty 100

## 2018-07-09 MED ORDER — METHOCARBAMOL 500 MG PO TABS
500.0000 mg | ORAL_TABLET | Freq: Three times a day (TID) | ORAL | Status: DC | PRN
  Administered 2018-07-09: 500 mg via ORAL
  Filled 2018-07-09: qty 1

## 2018-07-09 MED ORDER — SODIUM CHLORIDE 0.9 % IV SOLN
75.0000 mL/h | INTRAVENOUS | Status: DC
  Administered 2018-07-09: 75 mL/h via INTRAVENOUS

## 2018-07-09 MED ORDER — PREDNISONE 10 MG PO TABS
10.0000 mg | ORAL_TABLET | Freq: Every day | ORAL | Status: DC
  Administered 2018-07-10: 10 mg via ORAL
  Filled 2018-07-09: qty 1

## 2018-07-09 MED ORDER — PHENOL 1.4 % MT LIQD: 1.0000 | OROMUCOSAL | Status: DC | PRN

## 2018-07-09 MED ORDER — PROPOFOL 10 MG/ML IV BOLUS
INTRAVENOUS | Status: DC | PRN
  Administered 2018-07-09: 30 mg via INTRAVENOUS
  Administered 2018-07-09: 50 mg via INTRAVENOUS
  Administered 2018-07-09 (×3): 20 mg via INTRAVENOUS
  Administered 2018-07-09: 30 mg via INTRAVENOUS
  Administered 2018-07-09: 20 mg via INTRAVENOUS

## 2018-07-09 MED ORDER — MIDAZOLAM HCL 2 MG/2ML IJ SOLN
INTRAMUSCULAR | Status: AC
  Filled 2018-07-09: qty 2

## 2018-07-09 MED ORDER — FENTANYL CITRATE (PF) 250 MCG/5ML IJ SOLN
INTRAMUSCULAR | Status: DC | PRN
  Administered 2018-07-09 (×2): 50 ug via INTRAVENOUS
  Administered 2018-07-09: 25 ug via INTRAVENOUS

## 2018-07-09 MED ORDER — DOCUSATE SODIUM 100 MG PO CAPS
100.0000 mg | ORAL_CAPSULE | Freq: Two times a day (BID) | ORAL | Status: DC
  Administered 2018-07-09 – 2018-07-10 (×2): 100 mg via ORAL
  Filled 2018-07-09 (×2): qty 1

## 2018-07-09 MED ORDER — ONDANSETRON HCL 4 MG/2ML IJ SOLN
4.0000 mg | Freq: Four times a day (QID) | INTRAMUSCULAR | Status: DC | PRN
  Administered 2018-07-09 – 2018-07-10 (×3): 4 mg via INTRAVENOUS
  Filled 2018-07-09 (×3): qty 2

## 2018-07-09 MED ORDER — INSULIN ASPART 100 UNIT/ML ~~LOC~~ SOLN
0.0000 [IU] | Freq: Three times a day (TID) | SUBCUTANEOUS | Status: DC
  Administered 2018-07-10: 2 [IU] via SUBCUTANEOUS

## 2018-07-09 MED ORDER — SODIUM CHLORIDE 0.9 % IV SOLN: INTRAVENOUS | Status: DC

## 2018-07-09 MED ORDER — KETOROLAC TROMETHAMINE 15 MG/ML IJ SOLN
7.5000 mg | Freq: Four times a day (QID) | INTRAMUSCULAR | Status: DC
  Administered 2018-07-09 – 2018-07-10 (×3): 7.5 mg via INTRAVENOUS
  Filled 2018-07-09 (×4): qty 1

## 2018-07-09 MED ORDER — METOCLOPRAMIDE HCL 5 MG/ML IJ SOLN
5.0000 mg | Freq: Three times a day (TID) | INTRAMUSCULAR | Status: DC | PRN
  Administered 2018-07-09 – 2018-07-10 (×2): 10 mg via INTRAVENOUS
  Filled 2018-07-09 (×2): qty 2

## 2018-07-09 MED ORDER — NITROGLYCERIN 0.4 MG SL SUBL: 0.4000 mg | SUBLINGUAL_TABLET | SUBLINGUAL | Status: DC | PRN

## 2018-07-09 MED ORDER — METFORMIN HCL ER 500 MG PO TB24
500.0000 mg | ORAL_TABLET | ORAL | Status: DC
  Administered 2018-07-10: 500 mg via ORAL
  Filled 2018-07-09: qty 1

## 2018-07-09 MED ORDER — ONDANSETRON HCL 4 MG/2ML IJ SOLN
INTRAMUSCULAR | Status: DC | PRN
  Administered 2018-07-09: 4 mg via INTRAVENOUS

## 2018-07-09 SURGICAL SUPPLY — 69 items
BAG DECANTER FOR FLEXI CONT (MISCELLANEOUS) ×3 IMPLANT
BANDAGE ESMARK 6X9 LF (GAUZE/BANDAGES/DRESSINGS) ×1 IMPLANT
BLADE SAGITTAL 25.0X1.19X90 (BLADE) ×2 IMPLANT
BLADE SAGITTAL 25.0X1.19X90MM (BLADE) ×1
BNDG CMPR 9X6 STRL LF SNTH (GAUZE/BANDAGES/DRESSINGS) ×1
BNDG ESMARK 6X9 LF (GAUZE/BANDAGES/DRESSINGS) ×3
BOWL SMART MIX CTS (DISPOSABLE) ×3 IMPLANT
CEMENT HV SMART SET (Cement) ×6 IMPLANT
CEMENT TIBIA MBT SIZE 5 (Knees) IMPLANT
COMP FEM CEM LRG RT LCS (Orthopedic Implant) ×3 IMPLANT
COMPONENT FEM CEM LRG RT LCS (Orthopedic Implant) IMPLANT
COVER SURGICAL LIGHT HANDLE (MISCELLANEOUS) ×3 IMPLANT
COVER WAND RF STERILE (DRAPES) ×3 IMPLANT
CUFF TOURNIQUET SINGLE 34IN LL (TOURNIQUET CUFF) ×3 IMPLANT
CUFF TOURNIQUET SINGLE 44IN (TOURNIQUET CUFF) IMPLANT
DECANTER SPIKE VIAL GLASS SM (MISCELLANEOUS) ×3 IMPLANT
DRAPE EXTREMITY T 121X128X90 (DRAPE) ×3 IMPLANT
DRAPE HALF SHEET 40X57 (DRAPES) ×4 IMPLANT
DRSG ADAPTIC 3X8 NADH LF (GAUZE/BANDAGES/DRESSINGS) ×3 IMPLANT
DRSG PAD ABDOMINAL 8X10 ST (GAUZE/BANDAGES/DRESSINGS) ×6 IMPLANT
DURAPREP 26ML APPLICATOR (WOUND CARE) ×8 IMPLANT
ELECT CAUTERY BLADE 6.4 (BLADE) ×3 IMPLANT
ELECT REM PT RETURN 9FT ADLT (ELECTROSURGICAL) ×3
ELECTRODE REM PT RTRN 9FT ADLT (ELECTROSURGICAL) ×1 IMPLANT
EVACUATOR 1/8 PVC DRAIN (DRAIN) IMPLANT
FACESHIELD WRAPAROUND (MASK) ×6 IMPLANT
FACESHIELD WRAPAROUND OR TEAM (MASK) ×2 IMPLANT
GAUZE SPONGE 4X4 12PLY STRL (GAUZE/BANDAGES/DRESSINGS) ×3 IMPLANT
GAUZE SPONGE 4X4 12PLY STRL LF (GAUZE/BANDAGES/DRESSINGS) IMPLANT
GLOVE BIOGEL PI IND STRL 8 (GLOVE) ×1 IMPLANT
GLOVE BIOGEL PI IND STRL 8.5 (GLOVE) ×1 IMPLANT
GLOVE BIOGEL PI INDICATOR 8 (GLOVE) ×2
GLOVE BIOGEL PI INDICATOR 8.5 (GLOVE) ×2
GLOVE ECLIPSE 8.0 STRL XLNG CF (GLOVE) ×6 IMPLANT
GLOVE ECLIPSE 8.5 STRL (GLOVE) ×6 IMPLANT
GOWN STRL REUS W/ TWL LRG LVL3 (GOWN DISPOSABLE) ×2 IMPLANT
GOWN STRL REUS W/TWL 2XL LVL3 (GOWN DISPOSABLE) ×3 IMPLANT
GOWN STRL REUS W/TWL LRG LVL3 (GOWN DISPOSABLE) ×9
HANDPIECE INTERPULSE COAX TIP (DISPOSABLE) ×3
INSERT TIB LCS RP LRG 10 (Knees) ×2 IMPLANT
KIT BASIN OR (CUSTOM PROCEDURE TRAY) ×3 IMPLANT
KIT TURNOVER KIT B (KITS) ×3 IMPLANT
MANIFOLD NEPTUNE II (INSTRUMENTS) ×3 IMPLANT
NEEDLE 22X1 1/2 (OR ONLY) (NEEDLE) ×3 IMPLANT
NS IRRIG 1000ML POUR BTL (IV SOLUTION) ×3 IMPLANT
PACK TOTAL JOINT (CUSTOM PROCEDURE TRAY) ×3 IMPLANT
PAD ABD 8X10 STRL (GAUZE/BANDAGES/DRESSINGS) ×2 IMPLANT
PAD ARMBOARD 7.5X6 YLW CONV (MISCELLANEOUS) ×2 IMPLANT
PAD CAST 4YDX4 CTTN HI CHSV (CAST SUPPLIES) ×1 IMPLANT
PADDING CAST COTTON 4X4 STRL (CAST SUPPLIES) ×3
PADDING CAST COTTON 6X4 STRL (CAST SUPPLIES) ×3 IMPLANT
PEG PATELLA CEM MB COMP LG (Knees) ×2 IMPLANT
PIN STEINMAN FIXATION KNEE (PIN) ×2 IMPLANT
SET HNDPC FAN SPRY TIP SCT (DISPOSABLE) ×1 IMPLANT
STAPLER VISISTAT 35W (STAPLE) ×3 IMPLANT
SUCTION FRAZIER HANDLE 10FR (MISCELLANEOUS) ×2
SUCTION TUBE FRAZIER 10FR DISP (MISCELLANEOUS) ×1 IMPLANT
SURGIFLO W/THROMBIN 8M KIT (HEMOSTASIS) IMPLANT
SUT BONE WAX W31G (SUTURE) ×3 IMPLANT
SUT ETHIBOND NAB CT1 #1 30IN (SUTURE) ×6 IMPLANT
SUT MNCRL AB 3-0 PS2 18 (SUTURE) ×3 IMPLANT
SUT VIC AB 0 CT1 27 (SUTURE) ×3
SUT VIC AB 0 CT1 27XBRD ANBCTR (SUTURE) ×1 IMPLANT
SYR CONTROL 10ML LL (SYRINGE) ×2 IMPLANT
TIBIA MBT CEMENT SIZE 5 (Knees) ×3 IMPLANT
TOWEL OR 17X24 6PK STRL BLUE (TOWEL DISPOSABLE) ×3 IMPLANT
TOWEL OR 17X26 10 PK STRL BLUE (TOWEL DISPOSABLE) ×3 IMPLANT
TRAY FOLEY BAG SILVER LF 16FR (SET/KITS/TRAYS/PACK) ×3 IMPLANT
WRAP KNEE MAXI GEL POST OP (GAUZE/BANDAGES/DRESSINGS) ×3 IMPLANT

## 2018-07-09 NOTE — Evaluation (Signed)
Physical Therapy Evaluation Patient Details Name: Marcus Taylor MRN: [id number] DOB: 1940/09/15 Today's Date: 07/09/2018   History of Present Illness  Pt is a 77 y/o male s/p elective R TKA. PMH includes DM, CAD, HTN, and STEMI.   Clinical Impression  Pt is s/p surgery above with deficits below. Pt requiring min to min guard A for mobility using RW. Reviewed PWB precautions, knee precautions, and supine HEP. Will continue to follow acutely to maximize functional mobility independence and safety.     Follow Up Recommendations Follow surgeon's recommendation for DC plan and follow-up therapies;Supervision for mobility/OOB    Equipment Recommendations  None recommended by PT    Recommendations for Other Services OT consult     Precautions / Restrictions Precautions Precautions: Knee Precaution Booklet Issued: Yes (comment) Precaution Comments: Reviewed knee precautions and supine HEP with pt.  Restrictions Weight Bearing Restrictions: Yes RLE Weight Bearing: Partial weight bearing RLE Partial Weight Bearing Percentage or Pounds: 50      Mobility  Bed Mobility Overal bed mobility: Needs Assistance Bed Mobility: Supine to Sit     Supine to sit: Supervision     General bed mobility comments: Supervision for safety. Increased time required.   Transfers Overall transfer level: Needs assistance Equipment used: Rolling walker (2 wheeled) Transfers: Sit to/from Stand Sit to Stand: Min assist         General transfer comment: Min A for lift assist and steadying. Cues for safe hand placement.   Ambulation/Gait Ambulation/Gait assistance: Min guard Gait Distance (Feet): 10 Feet Assistive device: Rolling walker (2 wheeled) Gait Pattern/deviations: Step-to pattern;Decreased step length - right;Decreased step length - left;Decreased weight shift to right;Antalgic Gait velocity: Decreased    General Gait Details: Slow, antalgic gait. No overt LOB noted with use of RW.  Cues to press through UEs when taking step with RLE to maintain PWB precautions.   Stairs            Wheelchair Mobility    Modified Rankin (Stroke Patients Only)       Balance Overall balance assessment: Needs assistance Sitting-balance support: No upper extremity supported;Feet supported Sitting balance-Leahy Scale: Good     Standing balance support: Bilateral upper extremity supported;During functional activity Standing balance-Leahy Scale: Poor Standing balance comment: Reliant on BUE support                              Pertinent Vitals/Pain Pain Assessment: 0-10 Pain Score: 2  Pain Location: R knee Pain Descriptors / Indicators: Aching;Operative site guarding Pain Intervention(s): Limited activity within patient's tolerance;Monitored during session;Repositioned    Home Living Family/patient expects to be discharged to:: Private residence Living Arrangements: Spouse/significant other Available Help at Discharge: Family;Available 24 hours/day Type of Home: House Home Access: Ramped entrance     Home Layout: One level Home Equipment: Walker - 2 wheels;Cane - single point;Bedside commode;Other (comment)(CPM)      Prior Function Level of Independence: Independent               Hand Dominance        Extremity/Trunk Assessment   Upper Extremity Assessment Upper Extremity Assessment: Defer to OT evaluation    Lower Extremity Assessment Lower Extremity Assessment: RLE deficits/detail RLE Deficits / Details: Deficits consistent with post op pain and weakness.     Cervical / Trunk Assessment Cervical / Trunk Assessment: Normal  Communication   Communication: No difficulties  Cognition Arousal/Alertness: Awake/alert Behavior During Therapy:  WFL for tasks assessed/performed Overall Cognitive Status: Within Functional Limits for tasks assessed                                        General Comments General comments  (skin integrity, edema, etc.): Pt's wife, son, and daughter present during session.     Exercises Total Joint Exercises Ankle Circles/Pumps: AROM;Both;20 reps Quad Sets: AROM;Right;10 reps Heel Slides: AROM;Right;10 reps   Assessment/Plan    PT Assessment Patient needs continued PT services  PT Problem List Decreased strength;Decreased mobility;Decreased balance;Decreased range of motion;Decreased knowledge of use of DME;Decreased knowledge of precautions;Pain       PT Treatment Interventions DME instruction;Gait training;Functional mobility training;Therapeutic exercise;Therapeutic activities;Balance training;Patient/family education    PT Goals (Current goals can be found in the Care Plan section)  Acute Rehab PT Goals Patient Stated Goal: To be able to walk  PT Goal Formulation: With patient Time For Goal Achievement: 07/23/18 Potential to Achieve Goals: Good    Frequency 7X/week   Barriers to discharge        Co-evaluation               AM-PAC PT "6 Clicks" Mobility  Outcome Measure Help needed turning from your back to your side while in a flat bed without using bedrails?: None Help needed moving from lying on your back to sitting on the side of a flat bed without using bedrails?: None Help needed moving to and from a bed to a chair (including a wheelchair)?: A Little Help needed standing up from a chair using your arms (e.g., wheelchair or bedside chair)?: A Little Help needed to walk in hospital room?: A Little Help needed climbing 3-5 steps with a railing? : A Lot 6 Click Score: 19    End of Session Equipment Utilized During Treatment: Gait belt Activity Tolerance: Patient tolerated treatment well Patient left: in chair;with call bell/phone within reach;with family/visitor present Nurse Communication: Mobility status PT Visit Diagnosis: Muscle weakness (generalized) (M62.81);Other abnormalities of gait and mobility (R26.89);Pain Pain - Right/Left:  Right Pain - part of body: Knee    Time: 1713-1736 PT Time Calculation (min) (ACUTE ONLY): 23 min   Charges:   PT Evaluation $PT Eval Low Complexity: 1 Low PT Treatments $Therapeutic Activity: 8-22 mins        Leighton Ruff, PT, DPT  Acute Rehabilitation Services  Pager: [phone] Office: [phone]   Rudean Hitt 07/09/2018, 6:41 PM

## 2018-07-09 NOTE — Progress Notes (Signed)
Orthopedic Tech Progress Note Patient Details:  Marcus Taylor [birth year] [id number]  CPM Right Knee CPM Right Knee: On Right Knee Flexion (Degrees): 90 Right Knee Extension (Degrees): 0  Post Interventions Patient Tolerated: Well Instructions Provided: Care of device, Adjustment of device  [place] Pink 07/09/2018, 2:58 PM

## 2018-07-09 NOTE — Progress Notes (Addendum)
1500 Received pt from PACU, A&O x4. Right knee dressing dry and intact. CPM on at 0-90 degrees, pt tolerating. Denies pain at this time.  1840 Pt was nauseated, cold sweats, feels hot. V/s checked, BP 119/64, P 55, O2 sats 97 at room air. Pt had a big dinner at 1700 and he is saying that he might be sensitive to narcotics and other meds that was given earlier. Pt felt better after few min, Zofran was given.

## 2018-07-09 NOTE — Anesthesia Procedure Notes (Signed)
Spinal  Patient location during procedure: OR Start time: 07/09/2018 10:45 AM End time: 07/09/2018 10:55 AM Staffing Anesthesiologist: Freddrick March, MD Performed: anesthesiologist  Preanesthetic Checklist Completed: patient identified, surgical consent, pre-op evaluation, timeout performed, IV checked, risks and benefits discussed and monitors and equipment checked Spinal Block Patient position: sitting Prep: site prepped and draped and DuraPrep Patient monitoring: cardiac monitor, continuous pulse ox and blood pressure Approach: midline Location: L3-4 Injection technique: single-shot Needle Needle type: Whitacre  Needle gauge: 22 G Needle length: 9 cm Assessment Sensory level: T6 Additional Notes Functioning IV was confirmed and monitors were applied. Sterile prep and drape, including hand hygiene and sterile gloves were used. The patient was positioned and the spine was prepped. The skin was anesthetized with lidocaine.  Free flow of clear CSF was obtained prior to injecting local anesthetic into the CSF.  The spinal needle aspirated freely following injection.  The needle was carefully withdrawn.  The patient tolerated the procedure well.

## 2018-07-09 NOTE — Transfer of Care (Signed)
Immediate Anesthesia Transfer of Care Note  Patient: Marcus Taylor  Procedure(s) Performed: RIGHT TOTAL KNEE ARTHROPLASTY (Right Knee)  Patient Location: PACU  Anesthesia Type:MAC and Spinal  Level of Consciousness: drowsy and patient cooperative  Airway & Oxygen Therapy: Patient Spontanous Breathing, room air.  Post-op Assessment: Report given to RN and Post -op Vital signs reviewed and stable  Post vital signs: Reviewed and stable  Last Vitals:  Vitals Value Taken Time  BP 111/60 07/09/2018  1:25 PM  Temp    Pulse 105 07/09/2018  1:26 PM  Resp 14 07/09/2018  1:26 PM  SpO2 98 % 07/09/2018  1:26 PM  Vitals shown include unvalidated device data.  Last Pain:  Vitals:   07/09/18 0855  TempSrc:   PainSc: 0-No pain      Patients Stated Pain Goal: 3 (91/79/15 0569)  Complications: No apparent anesthesia complications

## 2018-07-09 NOTE — Progress Notes (Signed)
The recent History & Physical has been reviewed. I have personally examined the patient today. There is no interval change to the documented History & Physical. The patient would like to proceed with the procedure.  Marcus Taylor 07/09/2018,  9:59 AM  Patient ID: Marcus Taylor, male   DOB: February 11, 1941, 77 y.o.   MRN: [id number]

## 2018-07-09 NOTE — Op Note (Signed)
NAME: Marcus, Taylor MEDICAL RECORD [id number] ACCOUNT [id number] DATE OF BIRTH:1941-06-05 FACILITY: [facility] LOCATION: [facility] [place], [place]  OPERATIVE REPORT  DATE OF PROCEDURE:  07/09/2018  PREOPERATIVE DIAGNOSIS:  End-stage osteoarthritis, right knee.  POSTOPERATIVE DIAGNOSIS:  End-stage osteoarthritis, right knee.  PROCEDURE:  Left total knee replacement.  SURGEON:  Joni Fears, MD  ASSISTANT:  Biagio Borg, PA-C.  ANESTHESIA:  Spinal with adductor canal block and IV sedation.  COMPLICATIONS:  None.  COMPONENTS:  DePuy LCS large femoral component #5 rotating keeled tibial tray with a 10 mm polyethylene bridging fixed-bearing metal backed 3 peg rotating patella.  Components were secured with polymethyl methacrylate with added vancomycin.  DESCRIPTION OF PROCEDURE:  The patient was met with his family in the holding area.  I identified the right knee as appropriate operative site and marked it accordingly.  Anesthesia performed an adductor canal block.  The patient was then transported to room 7.  Spinal anesthesia was performed by anesthesia without difficulty.  With IV sedation, he was placed comfortably in the supine position.  The right lower extremity was then placed in a thigh tourniquet.  The  right lower extremity was then prepped with chlorhexidine scrub and DuraPrep x2.  Sterile draping was performed.  Timeout was called.  Extremity was elevated and Esmarch exsanguinated with a proximal tourniquet at 325 mmHg.  A midline longitudinal incision was made centered about the patella extending from the superior pouch to the tibial tubercle.  By sharp dissection, incision carried down to subcutaneous tissue.  There was an extensive prepatellar bursa that was excised.  The deep capsule was then incised along the medial parapatellar region.  The joint was entered.  There was a large clear yellow joint effusion.  The patella was everted 180 degrees  and the knee flexed to 90 degrees.  There was an abundant beefy red  synovitis.  Specimens were sent for final sectioning.  Synovectomy was performed.  I measured a large femoral component.  First bony cut was then made transversely in the proximal tibia with a 7 degree angle of declination.  After each bony cut on the tibia and femur, I checked the external alignment with the external guide.  Subsequent cuts were then made on the femur  using the large femoral guide.  I used a 4 degree distal femoral valgus cut.  Laminar spreaders were inserted along the medial and lateral compartment.  I excised the medial and lateral menisci as well as ACL and PCL.  There were three very large loose  bodies in the popliteal space each measuring over 2 cm in diameter.  A synovectomy was performed posteriorly.  I removed osteophytes from the posterior femoral condyle with a 3/4 inch curved osteotome.  Flexion and extension gaps were perfectly symmetrical at 10 mm.  MCL and LCL remained intact.  The finishing guide was then applied to the femur for tapering cuts in the center hole.  Retractors then placed around the tibia and advanced anteriorly.  I measured a #5 tibial tray.  This was pinned in place.  The central hole was then made followed by the keeled cut.  With the tibial jig in place, the 10 mm polyethylene bridging bearing  was applied followed by the trial large femoral component.  The entire construct was reduced and through a full range of motion, had full extension, whereas there was a flexion contracture preoperatively.  There was no opening with varus valgus stress.  Patella was then prepared by removing  12 mm of bone leaving 14 mm patella thickness.  The three patellar holes were then made.  Trial inserted and reduced.  There was no instability.  Trial components were removed.  The joint was copiously irrigated with saline solution.  Final components were then impacted with polymethyl methacrylate  with added vancomycin as the patient has had a prior history of a staph infection after surgery and he does have history of chronic lymphocytic leukemia with potential increased risk for  infection.  We initially applied the #5 tibial tray followed by the 10 mm polyethylene bridging bearing and the large femoral component.  This construct was reduced.  Extraneous methacrylate was removed from the periphery of the components.  Patella was applied with  methacrylate and added vancomycin as well with the patellar clamp.  At approximately 16 minutes the methacrylate had matured and during that time we injected the deep capsule with 0.25% Marcaine with epinephrine.  Tourniquet was deflated.  There was immediate capillary refill to the joint surface.  Gross bleeders were Bovie coagulated.  We then applied the tranexamic acid topically under compression.  I thought we had a nice dry field.  The deep capsule was closed with a running 0 Ethibond suture.  A superficial capsule with 0 Vicryl, subcutaneous with 3-0 Monocryl, skin closed with skin clips.  Sterile bulky dressing was applied by the patient's support stocking.  The patient tolerated procedure without complications.  Technically, the procedure went very well.  There was considerable deformity and synovitis requiring extra time.  TN/NUANCE  D:07/09/2018 T:07/09/2018 JOB:[id number]

## 2018-07-09 NOTE — Anesthesia Procedure Notes (Signed)
Anesthesia Regional Block: Adductor canal block   Pre-Anesthetic Checklist: ,, timeout performed, Correct Patient, Correct Site, Correct Laterality, Correct Procedure, Correct Position, site marked, Risks and benefits discussed,  Surgical consent,  Pre-op evaluation,  At surgeon's request and post-op pain management  Laterality: Right  Prep: Maximum Sterile Barrier Precautions used, chloraprep       Needles:  Injection technique: Single-shot  Needle Type: Echogenic Stimulator Needle     Needle Length: 9cm  Needle Gauge: 22     Additional Needles:   Procedures:,,,, ultrasound used (permanent image in chart),,,,  Narrative:  Start time: 07/09/2018 9:38 AM End time: 07/09/2018 9:48 AM Injection made incrementally with aspirations every 5 mL.  Performed by: Personally  Anesthesiologist: Freddrick March, MD  Additional Notes: Monitors applied. No increased pain on injection. No increased resistance to injection. Injection made in 5cc increments. Good needle visualization. Patient tolerated procedure well.

## 2018-07-09 NOTE — Progress Notes (Signed)
PATIENT ID:      WINSTON MISNER  MRN:     [id number] DOB/AGE:    [birth year] / 77 y.o.       OPERATIVE REPORT    DATE OF PROCEDURE:  07/09/2018       PREOPERATIVE DIAGNOSIS: end stage  right knee osteoarthritis                                                       Estimated body mass index is 28.87 kg/m as calculated from the following:   Height as of this encounter: 5\' 11"  (1.803 m).   Weight as of this encounter: 93.9 kg.     POSTOPERATIVE DIAGNOSIS:end stage   right knee osteoarthritis                                                                     Estimated body mass index is 28.87 kg/m as calculated from the following:   Height as of this encounter: 5\' 11"  (1.803 m).   Weight as of this encounter: 93.9 kg.     PROCEDURE:  Procedure(s): RIGHT TOTAL KNEE ARTHROPLASTY      SURGEON:  Joni Fears, MD    ASSISTANT:   Biagio Borg, PA-C   (Present and scrubbed throughout the case, critical for assistance with exposure, retraction, instrumentation, and closure.)          ANESTHESIA: regional, spinal and IV sedation     DRAINS: none :      TOURNIQUET TIME:  Total Tourniquet Time Documented: Thigh (Right) - 92 minutes Total: Thigh (Right) - 92 minutes     COMPLICATIONS:  None   CONDITION:  stable  PROCEDURE IN DETAIL: [place] 07/09/2018, 12:53 PM  Patient ID: [name], male   DOB: March 19, 1941, 77 y.o.   MRN: [id number]

## 2018-07-10 ENCOUNTER — Encounter (HOSPITAL_COMMUNITY): Payer: Self-pay | Admitting: Orthopaedic Surgery

## 2018-07-10 DIAGNOSIS — Z96651 Presence of right artificial knee joint: Secondary | ICD-10-CM | POA: Diagnosis not present

## 2018-07-10 DIAGNOSIS — I10 Essential (primary) hypertension: Secondary | ICD-10-CM | POA: Diagnosis not present

## 2018-07-10 DIAGNOSIS — Z7984 Long term (current) use of oral hypoglycemic drugs: Secondary | ICD-10-CM | POA: Diagnosis not present

## 2018-07-10 DIAGNOSIS — Z7982 Long term (current) use of aspirin: Secondary | ICD-10-CM | POA: Diagnosis not present

## 2018-07-10 DIAGNOSIS — Z7902 Long term (current) use of antithrombotics/antiplatelets: Secondary | ICD-10-CM | POA: Diagnosis not present

## 2018-07-10 DIAGNOSIS — Z87891 Personal history of nicotine dependence: Secondary | ICD-10-CM | POA: Diagnosis not present

## 2018-07-10 DIAGNOSIS — I252 Old myocardial infarction: Secondary | ICD-10-CM | POA: Diagnosis not present

## 2018-07-10 DIAGNOSIS — M1711 Unilateral primary osteoarthritis, right knee: Secondary | ICD-10-CM | POA: Diagnosis not present

## 2018-07-10 DIAGNOSIS — C9111 Chronic lymphocytic leukemia of B-cell type in remission: Secondary | ICD-10-CM | POA: Diagnosis not present

## 2018-07-10 DIAGNOSIS — Z79899 Other long term (current) drug therapy: Secondary | ICD-10-CM | POA: Diagnosis not present

## 2018-07-10 DIAGNOSIS — I2510 Atherosclerotic heart disease of native coronary artery without angina pectoris: Secondary | ICD-10-CM | POA: Diagnosis not present

## 2018-07-10 DIAGNOSIS — D62 Acute posthemorrhagic anemia: Secondary | ICD-10-CM | POA: Diagnosis not present

## 2018-07-10 DIAGNOSIS — E119 Type 2 diabetes mellitus without complications: Secondary | ICD-10-CM | POA: Diagnosis not present

## 2018-07-10 LAB — CBC
HCT: 32.7 % — ABNORMAL LOW (ref 39.0–52.0)
HEMOGLOBIN: 10 g/dL — AB (ref 13.0–17.0)
MCH: 27.3 pg (ref 26.0–34.0)
MCHC: 30.6 g/dL (ref 30.0–36.0)
MCV: 89.3 fL (ref 80.0–100.0)
NRBC: 0 % (ref 0.0–0.2)
PLATELETS: 145 10*3/uL — AB (ref 150–400)
RBC: 3.66 MIL/uL — AB (ref 4.22–5.81)
RDW: 15.8 % — ABNORMAL HIGH (ref 11.5–15.5)
WBC: 15.3 10*3/uL — ABNORMAL HIGH (ref 4.0–10.5)

## 2018-07-10 LAB — GLUCOSE, CAPILLARY
Glucose-Capillary: 129 mg/dL — ABNORMAL HIGH (ref 70–99)
Glucose-Capillary: 158 mg/dL — ABNORMAL HIGH (ref 70–99)

## 2018-07-10 LAB — BASIC METABOLIC PANEL
Anion gap: 4 — ABNORMAL LOW (ref 5–15)
BUN: 17 mg/dL (ref 8–23)
CO2: 27 mmol/L (ref 22–32)
Calcium: 8.2 mg/dL — ABNORMAL LOW (ref 8.9–10.3)
Chloride: 107 mmol/L (ref 98–111)
Creatinine, Ser: 1.02 mg/dL (ref 0.61–1.24)
GFR calc Af Amer: >60 mL/min (ref >60)
GLUCOSE: 116 mg/dL — AB (ref 70–99)
POTASSIUM: 3.6 mmol/L (ref 3.5–5.1)
Sodium: 138 mmol/L (ref 135–145)

## 2018-07-10 MED ORDER — OXYCODONE HCL 5 MG PO TABS: 5.0000 mg | ORAL_TABLET | ORAL | 0 refills | Status: DC | PRN

## 2018-07-10 MED ORDER — ONDANSETRON 4 MG PO TBDP: 4.0000 mg | ORAL_TABLET | Freq: Three times a day (TID) | ORAL | 0 refills | Status: DC | PRN

## 2018-07-10 MED ORDER — ACETAMINOPHEN 325 MG PO TABS: 650.0000 mg | ORAL_TABLET | Freq: Four times a day (QID) | ORAL | 0 refills | Status: DC

## 2018-07-10 MED ORDER — METHOCARBAMOL 500 MG PO TABS: 500.0000 mg | ORAL_TABLET | Freq: Three times a day (TID) | ORAL | 0 refills | Status: DC | PRN

## 2018-07-10 NOTE — Evaluation (Signed)
Occupational Therapy Evaluation Patient Details Name: [name] MRN: [id number] DOB: 1941-06-03 Today's Date: 07/10/2018    History of Present Illness Pt is a 77 y/o male s/p elective R TKA. PMH includes DM, CAD, HTN, and STEMI.    Clinical Impression   PTA, pt was living with his wife and was independent. Currently, pt requires Min Guard A for LB ADLs and functional mobility using RW. Provided education on LB ADLs, toilet transfer, and tub transfer with shower seat; pt demonstrated understanding. Answered all pt questions. Recommend dc home once medically stable per physician. All acute OT needs met and will sign off. Thank you.     Follow Up Recommendations  Follow surgeon's recommendation for DC plan and follow-up therapies;Supervision/Assistance - 24 hour    Equipment Recommendations  None recommended by OT    Recommendations for Other Services PT consult     Precautions / Restrictions Precautions Precautions: Knee Precaution Booklet Issued: Yes (comment) Precaution Comments: Reviewed knee precautions and supine HEP with pt.  Restrictions Weight Bearing Restrictions: Yes RLE Weight Bearing: Partial weight bearing RLE Partial Weight Bearing Percentage or Pounds: 50      Mobility Bed Mobility Overal bed mobility: Needs Assistance Bed Mobility: Supine to Sit     Supine to sit: Supervision     General bed mobility comments: Supervision for safety. Increased time required.   Transfers Overall transfer level: Needs assistance Equipment used: Rolling walker (2 wheeled) Transfers: Sit to/from Stand Sit to Stand: Min guard         General transfer comment: Min Guard A for safety. VCs for sequence of hand placement and weight shift    Balance Overall balance assessment: Needs assistance Sitting-balance support: No upper extremity supported;Feet supported Sitting balance-Leahy Scale: Good     Standing balance support: Bilateral upper extremity  supported;During functional activity Standing balance-Leahy Scale: Poor Standing balance comment: Reliant on BUE support                            ADL either performed or assessed with clinical judgement   ADL Overall ADL's : Needs assistance/impaired Eating/Feeding: Independent;Sitting   Grooming: Set up;Sitting   Upper Body Bathing: Set up;Sitting   Lower Body Bathing: Min guard;Sit to/from stand   Upper Body Dressing : Set up;Sitting   Lower Body Dressing: Min guard;Sit to/from stand Lower Body Dressing Details (indicate cue type and reason): Educating pt on LB dressing techniques. Min Guard A for safety in standing Toilet Transfer: Min guard;Ambulation;RW(Simulated in room) Toilet Transfer Details (indicate cue type and reason): Min Guard A for safety     Tub/ Shower Transfer: Min guard;Ambulation;Shower seat;Rolling walker;Tub transfer Tub/Shower Transfer Details (indicate cue type and reason): Edcuating pt on safe tub transfer with shower seat.  Functional mobility during ADLs: Min guard;Rolling walker General ADL Comments: Pt motivated to participate     Vision         Perception     Praxis      Pertinent Vitals/Pain Pain Assessment: 0-10 Pain Score: 2  Pain Location: R knee Pain Descriptors / Indicators: Aching;Operative site guarding Pain Intervention(s): Monitored during session;Limited activity within patient's tolerance;Repositioned     Hand Dominance Right   Extremity/Trunk Assessment Upper Extremity Assessment Upper Extremity Assessment: Overall WFL for tasks assessed   Lower Extremity Assessment Lower Extremity Assessment: Defer to PT evaluation   Cervical / Trunk Assessment Cervical / Trunk Assessment: Normal   Communication Communication Communication: No difficulties  Cognition Arousal/Alertness: Awake/alert Behavior During Therapy: WFL for tasks assessed/performed Overall Cognitive Status: Within Functional Limits for  tasks assessed                                     General Comments  Grandson present throughout session    Exercises     Shoulder Instructions      Home Living Family/patient expects to be discharged to:: Private residence Living Arrangements: Spouse/significant other Available Help at Discharge: Family;Available 24 hours/day Type of Home: House Home Access: Ramped entrance     Home Layout: One level     Bathroom Shower/Tub: Teacher, early years/pre: Standard     Home Equipment: Environmental consultant - 2 wheels;Cane - single point;Bedside commode;Other (comment)(CPM)          Prior Functioning/Environment Level of Independence: Independent                 OT Problem List: Decreased activity tolerance;Impaired balance (sitting and/or standing);Decreased knowledge of use of DME or AE;Decreased knowledge of precautions;Pain      OT Treatment/Interventions:      OT Goals(Current goals can be found in the care plan section) Acute Rehab OT Goals Patient Stated Goal: To be able to walk  OT Goal Formulation: All assessment and education complete, DC therapy  OT Frequency:     Barriers to D/C:            Co-evaluation              AM-PAC OT "6 Clicks" Daily Activity     Outcome Measure Help from another person eating meals?: None Help from another person taking care of personal grooming?: A Little Help from another person toileting, which includes using toliet, bedpan, or urinal?: A Little Help from another person bathing (including washing, rinsing, drying)?: A Little Help from another person to put on and taking off regular upper body clothing?: None Help from another person to put on and taking off regular lower body clothing?: A Little 6 Click Score: 20   End of Session Equipment Utilized During Treatment: Gait belt;Rolling walker Nurse Communication: Mobility status;Precautions;Weight bearing status  Activity Tolerance: Patient  tolerated treatment well Patient left: in chair;with family/visitor present(with PT)  OT Visit Diagnosis: Unsteadiness on feet (R26.81);Other abnormalities of gait and mobility (R26.89);Muscle weakness (generalized) (M62.81)                Time: 1000-1022 OT Time Calculation (min): 22 min Charges:  OT General Charges $OT Visit: 1 Visit OT Evaluation $OT Eval Low Complexity: 1 Low  Benita Boonstra MSOT, OTR/L Acute Rehab Pager: [phone] Office: [place] 07/10/2018, 1:01 PM

## 2018-07-10 NOTE — Anesthesia Postprocedure Evaluation (Signed)
Anesthesia Post Note  Patient: Marcus Taylor  Procedure(s) Performed: RIGHT TOTAL KNEE ARTHROPLASTY (Right Knee)     Patient location during evaluation: PACU Anesthesia Type: Regional Level of consciousness: awake Pain management: pain level controlled Vital Signs Assessment: post-procedure vital signs reviewed and stable Respiratory status: spontaneous breathing Cardiovascular status: stable Postop Assessment: spinal receding    Last Vitals:  Vitals:   07/10/18 1047 07/10/18 1048  BP: 125/70   Pulse: 92   Resp: 18   Temp:  36.9 C  SpO2: 96%     Last Pain:  Vitals:   07/10/18 1130  TempSrc:   PainSc: 5                  Rui Wordell

## 2018-07-10 NOTE — Care Management CC44 (Signed)
Condition Code 44 Documentation Completed  Patient Details  Name: Marcus Taylor MRN: [id number] Date of Birth: Feb 10, 1941   Condition Code 44 given:  Yes Patient signature on Condition Code 44 notice:  Yes Documentation of 2 MD's agreement:  Yes Code 44 added to claim:  Yes    Ella Bodo, RN 07/10/2018, 10:18 AM

## 2018-07-10 NOTE — Care Management Obs Status (Signed)
MEDICARE OBSERVATION STATUS NOTIFICATION   Patient Details  Name: Marcus Taylor MRN: [id number] Date of Birth: 29-Jul-1941   Medicare Observation Status Notification Given:  Yes    Ella Bodo, RN 07/10/2018, 10:18 AM

## 2018-07-10 NOTE — Care Management Note (Signed)
Case Management Note  Patient Details  Name: Marcus Taylor MRN: [id number] Date of Birth: 02/25/1941  Subjective/Objective:                    Action/Plan:  Spoke to patient and wife at bedside.  Patient states he has CPM, 3/, RW at home. He is pre assigned to [facility]. He is agreeable to [place]. No other CM needs.  Expected Discharge Date:  07/10/18               Expected Discharge Plan:  [place]  In-House Referral:     Discharge planning Services  CM Consult  Post Acute Care Choice:  Home Health Choice offered to:  Patient  DME Arranged:    DME Agency:     HH Arranged:  PT [place]:  [facility] (formerly [employer])  Status of Service:  Completed, signed off  If discussed at [employer], dates discussed:    Additional Comments:  Carles Collet, RN 07/10/2018, 11:39 AM

## 2018-07-10 NOTE — Discharge Summary (Signed)
Joni Fears, MD   Biagio Borg, PA-C [address] [place], [place]  [address]                             [phone]  PATIENT ID: Marcus Taylor        MRN:  [id number]          DOB/AGE: [birth year] / 77 y.o.    DISCHARGE SUMMARY  ADMISSION DATE:    07/09/2018 DISCHARGE DATE:   07/10/2018   ADMISSION DIAGNOSIS: right knee osteoarthritis    DISCHARGE DIAGNOSIS:  right knee osteoarthritis    ADDITIONAL DIAGNOSIS: Active Problems:   Primary osteoarthritis of right knee   S/P total knee arthroplasty, right  Past Medical History:  Diagnosis Date  . Anemia   . Arthritis   . Colonic polyp   . Concussion 03/05/2018  . Coronary artery disease    stents x 3 (03-2014)  . Diabetes mellitus   . Diverticulosis    throughout entire colon  . HTN (hypertension)   . Hypertriglyceridemia   . Kidney stone   . Lymphocytosis   . Metatarsal fracture left   left 5th proximal phalanx   . OSA (obstructive sleep apnea)    "Mild" by records  . PONV (postoperative nausea and vomiting)    first time, no problem since then  . RBBB 01/2016  . Splenic marginal zone b-cell lymphoma ([place]) 12/18/2011  . Splenomegaly   . STEMI (ST elevation myocardial infarction) [facility])    August 2015  . Subarachnoid hemorrhage ([place]) 03/05/2018    PROCEDURE: Procedure(s): RIGHT TOTAL KNEE ARTHROPLASTY Right on 07/09/2018  CONSULTS: none    HISTORY:Marcus Taylor Records, 77 y.o. male, has a history of pain and functional disability in the right knee due to arthritis and has failed non-surgical conservative treatments for greater than 12 weeks to includeNSAID's and/or analgesics, corticosteriod injections, flexibility and strengthening excercises, use of assistive devices and activity modification.  Onset of symptoms was gradual, starting >10 years ago with gradually worsening course since that time. The patient noted no past surgery on the right knee(s).  Patient currently rates pain in the right knee(s) at 9  out of 10 with activity. Patient has night pain, worsening of pain with activity and weight bearing, pain that interferes with activities of daily living, pain with passive range of motion, crepitus and joint swelling.  Patient has evidence of subchondral sclerosis, periarticular osteophytes, joint subluxation and joint space narrowing by imaging studies. There is no active infection.  HOSPITAL COURSE:  NEHEMIAH MCFARREN is a 77 y.o. admitted on 07/09/2018 and found to have a diagnosis of right knee osteoarthritis.  After appropriate laboratory studies were obtained  they were taken to the operating room on 07/09/2018 and underwent  Procedure(s): RIGHT TOTAL KNEE ARTHROPLASTY  .   They were given perioperative antibiotics:  Anti-infectives (From admission, onward)   Start     Dose/Rate Route Frequency Ordered Stop   07/09/18 1700  ceFAZolin (ANCEF) IVPB 2g/100 mL premix    Note to Pharmacy:  HAS LYMPHOMA AND ON PREDNISONE....  PLAN ADDITIONAL ANTIBIOTICS FOR PROPHYLAXIS   2 g 200 mL/hr over 30 Minutes Intravenous Every 6 hours 07/09/18 1509 07/10/18 2259   07/09/18 1150  vancomycin (VANCOCIN) powder  Status:  Discontinued       As needed 07/09/18 1151 07/09/18 1321   07/09/18 0859  ceFAZolin (ANCEF) 2-4 GM/100ML-% IVPB  Status:  Discontinued    Note  to Pharmacy:  Therese Sarah   : cabinet override      07/09/18 0859 07/09/18 0904   07/09/18 0830  ceFAZolin (ANCEF) IVPB 2g/100 mL premix     2 g 200 mL/hr over 30 Minutes Intravenous On call to O.R. 07/09/18 0258 07/09/18 1055    .  Tolerated the procedure well.  Placed with a foley intraoperatively.     Toradol was given post op.  POD #1, allowed out of bed to a chair.  PT for ambulation and exercise program.  Foley D/C'd in morning.  IV saline locked.  O2 discontionued. Tolerating CPM.  Desires discharge home today   .  The remainder of the hospital course was dedicated to ambulation and strengthening.   The patient was discharged on 1  Day Post-Op in  Stable condition.  Blood products given:none  DIAGNOSTIC STUDIES: Recent vital signs:  Patient Vitals for the past 24 hrs:  BP Temp Temp src Pulse Resp SpO2 Height Weight  07/10/18 0416 (!) 125/56 99.1 F (37.3 C) Oral 70 16 97 % - -  07/10/18 0043 (!) 138/59 97.9 F (36.6 C) Oral (!) 101 14 97 % - -  07/09/18 2013 124/61 98.3 F (36.8 C) Oral (!) 51 16 97 % - -  07/09/18 1836 119/64 97.9 F (36.6 C) Oral (!) 55 - 99 % - -  07/09/18 1511 135/61 97.6 F (36.4 C) Oral (!) 59 - 100 % - -  07/09/18 1445 128/69 (!) 97 F (36.1 C) - (!) 47 13 100 % - -  07/09/18 1430 117/66 - - (!) 44 12 97 % - -  07/09/18 1415 117/75 - - (!) 59 11 100 % - -  07/09/18 1400 128/71 - - 63 13 99 % - -  07/09/18 1345 (!) 135/57 - - (!) 58 15 99 % - -  07/09/18 1330 124/62 - - 78 11 97 % - -  07/09/18 1325 111/60 (!) 97 F (36.1 C) - 86 14 99 % - -  07/09/18 0945 (!) 161/74 - - 71 13 96 % - -  07/09/18 0940 (!) 134/58 - - 94 18 98 % - -  07/09/18 0830 (!) 159/59 97.6 F (36.4 C) Oral 67 18 98 % 5\' 11"  (1.803 m) 93.9 kg       Recent laboratory studies: Recent Labs    07/10/18 0354  WBC 15.3*  HGB 10.0*  HCT 32.7*  PLT 145*   Recent Labs    07/10/18 0354  NA 138  K 3.6  CL 107  CO2 27  BUN 17  CREATININE 1.02  GLUCOSE 116*  CALCIUM 8.2*   Lab Results  Component Value Date   INR 1.08 07/02/2018     Recent Radiographic Studies :  Xr Knee 1-2 Views Right  Result Date: 06/26/2018 2 view x-ray of the right knee reveals bone-on-bone medial compartment.  There are periarticular spurring both medially and laterally.  Market sclerosing especially in the lateral compartment.  Calcification is noted in the popliteal area on the lateral.   DISCHARGE INSTRUCTIONS: Discharge Instructions    CPM   Complete by:  As directed    Continuous passive motion machine (CPM):      Use the CPM from 0 to 60 for 6-8 hours per day.      You may increase by 5-10 degrees per day.  You may  break it up into 2 or 3 sessions per day.      Use CPM  for 3-4  weeks or until you are told to stop.   Call MD / Call 911   Complete by:  As directed    If you experience chest pain or shortness of breath, CALL 911 and be transported to the hospital emergency room.  If you develope a fever above 101 F, pus (white drainage) or increased drainage or redness at the wound, or calf pain, call your surgeon's office.   Change dressing   Complete by:  As directed    DO NOT CHANGE YOUR DRESSING   Constipation Prevention   Complete by:  As directed    Drink plenty of fluids.  Prune juice may be helpful.  You may use a stool softener, such as Colace (over the counter) 100 mg twice a day.  Use MiraLax (over the counter) for constipation as needed.   Diet general   Complete by:  As directed    Discharge instructions   Complete by:  As directed    Kite items at home which could result in a fall. This includes throw rugs or furniture in walking pathways ICE to the affected joint every three hours while awake for 30 minutes at a time, for at least the first 3-5 days, and then as needed for pain and swelling.  Continue to use ice for pain and swelling. You may notice swelling that will progress down to the foot and ankle.  This is normal after surgery.  Elevate your leg when you are not up walking on it.   Continue to use the breathing machine you got in the hospital (incentive spirometer) which will help keep your temperature down.  It is common for your temperature to cycle up and down following surgery, especially at night when you are not up moving around and exerting yourself.  The breathing machine keeps your lungs expanded and your temperature down.   DIET:  As you were doing prior to hospitalization, we recommend a well-balanced diet.  DRESSING / WOUND CARE / SHOWERING  Keep the surgical dressing until follow up.  The dressing is water proof, so you can  shower without any extra covering.  IF THE DRESSING FALLS OFF or the wound gets wet inside, change the dressing with sterile gauze.  Please use good hand washing techniques before changing the dressing.  Do not use any lotions or creams on the incision until instructed by your surgeon.    ACTIVITY  Increase activity slowly as tolerated, but follow the weight bearing instructions below.   No driving for 6 weeks or until further direction given by your physician.  You cannot drive while taking narcotics.  No lifting or carrying greater than 10 lbs. until further directed by your surgeon. Avoid periods of inactivity such as sitting longer than an hour when not asleep. This helps prevent blood clots.  You may return to work once you are authorized by your doctor.     WEIGHT BEARING   Partial weight bearing with assist device as directed.  50%   EXERCISES  Results after joint replacement surgery are often greatly improved when you follow the exercise, range of motion and muscle strengthening exercises prescribed by your doctor. Safety measures are also important to protect the joint from further injury. Any time any of these exercises cause you to have increased pain or swelling, decrease what you are doing until you are comfortable again and then slowly increase them. If you have problems or questions, call  your caregiver or physical therapist for advice.   Rehabilitation is important following a joint replacement. After just a few days of immobilization, the muscles of the leg can become weakened and shrink (atrophy).  These exercises are designed to build up the tone and strength of the thigh and leg muscles and to improve motion. Often times heat used for twenty to thirty minutes before working out will loosen up your tissues and help with improving the range of motion but do not use heat for the first two weeks following surgery (sometimes heat can increase post-operative swelling).   These  exercises can be done on a training (exercise) mat, on the floor, on a table or on a bed. Use whatever works the best and is most comfortable for you.    Use music or television while you are exercising so that the exercises are a pleasant break in your day. This will make your life better with the exercises acting as a break in your routine that you can look forward to.   Perform all exercises about fifteen times, three times per day or as directed.  You should exercise both the operative leg and the other leg as well.   Exercises include:  Quad Sets - Tighten up the muscle on the front of the thigh (Quad) and hold for 5-10 seconds.   Straight Leg Raises - With your knee straight (if you were given a brace, keep it on), lift the leg to 60 degrees, hold for 3 seconds, and slowly lower the leg.  Perform this exercise against resistance later as your leg gets stronger.  Leg Slides: Lying on your back, slowly slide your foot toward your buttocks, bending your knee up off the floor (only go as far as is comfortable). Then slowly slide your foot back down until your leg is flat on the floor again.  Angel Wings: Lying on your back spread your legs to the side as far apart as you can without causing discomfort.  Hamstring Strength:  Lying on your back, push your heel against the floor with your leg straight by tightening up the muscles of your buttocks.  Repeat, but this time bend your knee to a comfortable angle, and push your heel against the floor.  You may put a pillow under the heel to make it more comfortable if necessary.   A rehabilitation program following joint replacement surgery can speed recovery and prevent re-injury in the future due to weakened muscles. Contact your doctor or a physical therapist for more information on knee rehabilitation.    CONSTIPATION  Constipation is defined medically as fewer than three stools per week and severe constipation as less than one stool per week.  Even if  you have a regular bowel pattern at home, your normal regimen is likely to be disrupted due to multiple reasons following surgery.  Combination of anesthesia, postoperative narcotics, change in appetite and fluid intake all can affect your bowels.   YOU MUST use at least one of the following options; they are listed in order of increasing strength to get the job done.  They are all available over the counter, and you may need to use some, POSSIBLY even all of these options:    Drink plenty of fluids (prune juice may be helpful) and high fiber foods Colace 100 mg by mouth twice a day  Senokot for constipation as directed and as needed Dulcolax (bisacodyl), take with full glass of water  Miralax (polyethylene glycol) once or  twice a day as needed.  If you have tried all these things and are unable to have a bowel movement in the first 3-4 days after surgery call either your surgeon or your primary doctor.    If you experience loose stools or diarrhea, hold the medications until you stool forms back up.  If your symptoms do not get better within 1 week or if they get worse, check with your doctor.  If you experience "the worst abdominal pain ever" or develop nausea or vomiting, please contact the office immediately for further recommendations for treatment.   ITCHING:  If you experience itching with your medications, try taking only a single pain pill, or even half a pain pill at a time.  You can also use Benadryl over the counter for itching or also to help with sleep.   TED HOSE STOCKINGS:  Use stockings on both legs until for at least 2 weeks or as directed by physician office. They may be removed at night for sleeping.  MEDICATIONS:  See your medication summary on the "After Visit Summary" that nursing will review with you.  You may have some home medications which will be placed on hold until you complete the course of blood thinner medication.  It is important for you to complete the blood  thinner medication as prescribed.  PRECAUTIONS:  If you experience chest pain or shortness of breath - call 911 immediately for transfer to the hospital emergency department.   If you develop a fever greater that 101 F, purulent drainage from wound, increased redness or drainage from wound, foul odor from the wound/dressing, or calf pain - CONTACT YOUR SURGEON.                                                   FOLLOW-UP APPOINTMENTS:  If you do not already have a post-op appointment, please call the office for an appointment to be seen by your surgeon.  Guidelines for how soon to be seen are listed in your "After Visit Summary", but are typically between 1-4 weeks after surgery.  OTHER INSTRUCTIONS:   Knee Replacement:  Do not place pillow under knee, focus on keeping the knee straight while resting. CPM instructions: 0-90 degrees, 2 hours in the morning, 2 hours in the afternoon, and 2 hours in the evening. Place foam block, curve side up under heel at all times except when in CPM or when walking.  DO NOT modify, tear, cut, or change the foam block in any way.  MAKE SURE YOU:  Understand these instructions.  Get help right away if you are not doing well or get worse.    Thank you for letting us be a part of your medical care team.  It is a privilege we respect greatly.  We hope these instructions will help you stay on track for a fast and full recovery!   Do not put a pillow under the knee. Place it under the heel.   Complete by:  As directed    Driving restrictions   Complete by:  As directed    No driving for 6 weeks   Increase activity slowly as tolerated   Complete by:  As directed    Lifting restrictions   Complete by:  As directed    No lifting for 6 weeks   Partial weight  bearing   Complete by:  As directed    % Body Weight:  50%   Laterality:  right   Extremity:  Lower   Patient may shower   Complete by:  As directed    You may shower over the brown dressing   TED hose    Complete by:  As directed    Use stockings (TED hose) for 2-3 weeks on right leg.  You may remove them at night for sleeping.      DISCHARGE MEDICATIONS:   Allergies as of 07/10/2018   No Known Allergies     Medication List    TAKE these medications   acetaminophen 325 MG tablet Commonly known as:  TYLENOL Take 2 tablets (650 mg total) by mouth every 6 (six) hours.   aspirin EC 81 MG tablet Take 81 mg by mouth daily.   atorvastatin 40 MG tablet Commonly known as:  LIPITOR Take 1 tablet (40 mg total) by mouth daily.   clopidogrel 75 MG tablet Commonly known as:  PLAVIX Take 75 mg by mouth daily.   ferrous sulfate 325 (65 FE) MG tablet Take 325 mg by mouth daily with breakfast.   lisinopril 30 MG tablet Commonly known as:  PRINIVIL,ZESTRIL Take 1 tablet (30 mg total) by mouth daily.   metFORMIN 500 MG 24 hr tablet Commonly known as:  GLUCOPHAGE-XR Take 1 tablet (500 mg total) by mouth daily with breakfast. TAKE 1 TABLET BY MOUTH WITH EVENING MEAL M-W-F What changed:  when to take this   methocarbamol 500 MG tablet Commonly known as:  ROBAXIN Take 1 tablet (500 mg total) by mouth every 8 (eight) hours as needed for muscle spasms.   metoprolol tartrate 25 MG tablet Commonly known as:  LOPRESSOR Take 0.5 tablets (12.5 mg total) by mouth 2 (two) times daily.   nitroGLYCERIN 0.4 MG SL tablet Commonly known as:  NITROSTAT Place 1 tablet (0.4 mg total) under the tongue every 5 (five) minutes as needed for chest pain.   ondansetron 4 MG disintegrating tablet Commonly known as:  ZOFRAN-ODT Take 1 tablet (4 mg total) by mouth every 8 (eight) hours as needed for nausea or vomiting.   oxyCODONE 5 MG immediate release tablet Commonly known as:  Oxy IR/ROXICODONE Take 1-2 tablets (5-10 mg total) by mouth every 4 (four) hours as needed for severe pain (pain score 7-10).   predniSONE 10 MG tablet Commonly known as:  DELTASONE Take 1 tablet (10 mg total) by mouth daily  with breakfast.            Durable Medical Equipment  (From admission, onward)         Start     Ordered   07/09/18 1509  DME Walker rolling  Once    Question:  Patient needs a walker to treat with the following condition  Answer:  S/P total knee arthroplasty, right   07/09/18 1509   07/09/18 1509  DME 3 n 1  Once     07/09/18 1509   07/09/18 1509  DME Bedside commode  Once    Question:  Patient needs a bedside commode to treat with the following condition  Answer:  S/P total knee arthroplasty, right   07/09/18 1509           Discharge Care Instructions  (From admission, onward)         Start     Ordered   07/10/18 0000  Partial weight bearing    Question Answer Comment  %  Body Weight 50%   Laterality right   Extremity Lower      07/10/18 0815   07/10/18 0000  Change dressing    Comments:  DO NOT CHANGE YOUR DRESSING   07/10/18 0815          FOLLOW UP VISIT:    DISPOSITION:   Home  CONDITION:  Stable   Aaron Edelman D. [place], [place] [phone]  07/10/2018 8:19 AM

## 2018-07-10 NOTE — Progress Notes (Signed)
PATIENT ID: Marcus Taylor        MRN:  [id number]          DOB/AGE: 77-Jan-1942 / 77 y.o.    Joni Fears, MD   Biagio Borg, PA-C [address] [place], [place]  [address]                             [phone]   PROGRESS NOTE  Subjective:  negative for Chest Pain  negative for Shortness of Breath  positive for Nausea/Vomiting last night "OK" this am  negative for Calf Pain    Tolerating Diet: yes         Patient reports pain as mild.     Presently comfortable in CPM  Objective: Vital signs in last 24 hours:    Patient Vitals for the past 24 hrs:  BP Temp Temp src Pulse Resp SpO2 Height Weight  07/10/18 0416 (!) 125/56 99.1 F (37.3 C) Oral 70 16 97 % - -  07/10/18 0043 (!) 138/59 97.9 F (36.6 C) Oral (!) 101 14 97 % - -  07/09/18 2013 124/61 98.3 F (36.8 C) Oral (!) 51 16 97 % - -  07/09/18 1836 119/64 97.9 F (36.6 C) Oral (!) 55 - 99 % - -  07/09/18 1511 135/61 97.6 F (36.4 C) Oral (!) 59 - 100 % - -  07/09/18 1445 128/69 (!) 97 F (36.1 C) - (!) 47 13 100 % - -  07/09/18 1430 117/66 - - (!) 44 12 97 % - -  07/09/18 1415 117/75 - - (!) 59 11 100 % - -  07/09/18 1400 128/71 - - 63 13 99 % - -  07/09/18 1345 (!) 135/57 - - (!) 58 15 99 % - -  07/09/18 1330 124/62 - - 78 11 97 % - -  07/09/18 1325 111/60 (!) 97 F (36.1 C) - 86 14 99 % - -  07/09/18 0945 (!) 161/74 - - 71 13 96 % - -  07/09/18 0940 (!) 134/58 - - 94 18 98 % - -  07/09/18 0830 (!) 159/59 97.6 F (36.4 C) Oral 67 18 98 % 5\' 11"  (1.803 m) 93.9 kg      Intake/Output from previous day:   11/26 0701 - 11/27 0700 In: 2571.5 [I.V.:2171.5] Out: 2725 [Urine:2675]   Intake/Output this shift:   No intake/output data recorded.   Intake/Output      11/26 0701 - 11/27 0700 11/27 0701 - 11/28 0700   I.V. (mL/kg) 2171.5 (23.1)    IV Piggyback 400    Total Intake(mL/kg) 2571.5 (27.4)    Urine (mL/kg/hr) 2675    Blood 50    Total Output 2725    Net -153.5            LABORATORY  DATA: Recent Labs    07/10/18 0354  WBC 15.3*  HGB 10.0*  HCT 32.7*  PLT 145*   Recent Labs    07/10/18 0354  NA 138  K 3.6  CL 107  CO2 27  BUN 17  CREATININE 1.02  GLUCOSE 116*  CALCIUM 8.2*   Lab Results  Component Value Date   INR 1.08 07/02/2018    Recent Radiographic Studies :  Xr Knee 1-2 Views Right  Result Date: 06/26/2018 2 view x-ray of the right knee reveals bone-on-bone medial compartment.  There are periarticular spurring both medially and laterally.  Market sclerosing especially in  the lateral compartment.  Calcification is noted in the popliteal area on the lateral.    Examination:  General appearance: alert, cooperative and no distress  Wound Exam: clean, dry, intact   Drainage:  None: wound tissue dry  Motor Exam: EHL, FHL, Anterior Tibial and Posterior Tibial Intact  Sensory Exam: Superficial Peroneal, Deep Peroneal and Tibial normal  Vascular Exam: Normal  Assessment:    1 Day Post-Op  Procedure(s) (LRB): RIGHT TOTAL KNEE ARTHROPLASTY (Right)  ADDITIONAL DIAGNOSIS:  Active Problems:   Primary osteoarthritis of right knee   S/P total knee arthroplasty, right  Acute Blood Loss Anemia-asymptomatic   Plan: Physical Therapy as ordered Partial Weight Bearing @ 50% (PWB)  DVT Prophylaxis:  Aspirin, Foot Pumps and TED hose  DISCHARGE PLAN: Home  DISCHARGE NEEDS: HHPT, CPM, Walker and 3-in-1 comode seat OOB with PT, saline lock IV, consider discharge this pm  Patient'[facility]  07/10/2018 7:32 AM  Patient ID: Marcus Taylor, male   DOB: April 14, 1941, 77 y.o.   MRN: [id number]

## 2018-07-10 NOTE — Progress Notes (Signed)
Physical Therapy Treatment Patient Details Name: Marcus Taylor MRN: [id number] DOB: 07-02-1941 Today's Date: 07/10/2018    History of Present Illness Pt is a 77 y/o male s/p elective R TKA. PMH includes DM, CAD, HTN, and STEMI.     PT Comments    Patient seen for mobility progression. Pt requires min guard for functional transfers and gait training. HEP reviewed during session.  Pt with nausea during session with no emesis. RN notified and nausea medication requested. Pt is mobilizing well and current plan remains appropriate.     Follow Up Recommendations  Follow surgeon's recommendation for DC plan and follow-up therapies;Supervision for mobility/OOB     Equipment Recommendations  None recommended by PT    Recommendations for Other Services OT consult     Precautions / Restrictions Precautions Precautions: Knee Precaution Booklet Issued: Yes (comment) Precaution Comments: Reviewed knee/weight bearing precautions with pt Restrictions Weight Bearing Restrictions: Yes RLE Weight Bearing: Partial weight bearing RLE Partial Weight Bearing Percentage or Pounds: 50    Mobility  Bed Mobility Overal bed mobility: Needs Assistance Bed Mobility: Supine to Sit     Supine to sit: Supervision     General bed mobility comments: pt in chair upon arrival  Transfers Overall transfer level: Needs assistance Equipment used: Rolling walker (2 wheeled) Transfers: Sit to/from Stand Sit to Stand: Min guard         General transfer comment: min guard for safety  Ambulation/Gait Ambulation/Gait assistance: Min guard Gait Distance (Feet): 200 Feet Assistive device: Rolling walker (2 wheeled) Gait Pattern/deviations: Decreased step length - left;Decreased weight shift to right;Step-through pattern;Decreased stance time - right Gait velocity: Decreased    General Gait Details: cues for safe use of AD, sequencing, and posture   Stairs             Wheelchair  Mobility    Modified Rankin (Stroke Patients Only)       Balance Overall balance assessment: Needs assistance Sitting-balance support: No upper extremity supported;Feet supported Sitting balance-Leahy Scale: Good     Standing balance support: Bilateral upper extremity supported;During functional activity Standing balance-Leahy Scale: Poor Standing balance comment: Reliant on BUE support                             Cognition Arousal/Alertness: Awake/alert Behavior During Therapy: WFL for tasks assessed/performed Overall Cognitive Status: Within Functional Limits for tasks assessed                                        Exercises Total Joint Exercises Long Arc Quad: AROM;Right;10 reps;Seated Knee Flexion: AROM;AAROM;Right;5 reps;Seated;Other (comment)(10 second holds)    General Comments General comments (skin integrity, edema, etc.): pt nauseated during session so RN notified and nausea medication requested; grandson present      Pertinent Vitals/Pain Pain Assessment: Faces Pain Score: 2  Faces Pain Scale: Hurts a little bit Pain Location: R knee Pain Descriptors / Indicators: Aching;Operative site guarding Pain Intervention(s): Limited activity within patient's tolerance;Monitored during session;Premedicated before session;Repositioned    Home Living Family/patient expects to be discharged to:: Private residence Living Arrangements: Spouse/significant other Available Help at Discharge: Family;Available 24 hours/day Type of Home: House Home Access: Ramped entrance   Home Layout: One level Home Equipment: Walker - 2 wheels;Cane - single point;Bedside commode;Other (comment)(CPM)      Prior Function Level of Independence:  Independent          PT Goals (current goals can now be found in the care plan section) Acute Rehab PT Goals Patient Stated Goal: To be able to walk  Progress towards PT goals: Progressing toward goals     Frequency    7X/week      PT Plan Current plan remains appropriate    Co-evaluation              AM-PAC PT "6 Clicks" Mobility   Outcome Measure  Help needed turning from your back to your side while in a flat bed without using bedrails?: None Help needed moving from lying on your back to sitting on the side of a flat bed without using bedrails?: None Help needed moving to and from a bed to a chair (including a wheelchair)?: A Little Help needed standing up from a chair using your arms (e.g., wheelchair or bedside chair)?: A Little Help needed to walk in hospital room?: A Little Help needed climbing 3-5 steps with a railing? : A Lot 6 Click Score: 19    End of Session Equipment Utilized During Treatment: Gait belt Activity Tolerance: Patient tolerated treatment well Patient left: in chair;with call bell/phone within reach;with family/visitor present Nurse Communication: Mobility status PT Visit Diagnosis: Muscle weakness (generalized) (M62.81);Other abnormalities of gait and mobility (R26.89);Pain Pain - Right/Left: Right Pain - part of body: Knee     Time: 6151-8343 PT Time Calculation (min) (ACUTE ONLY): 32 min  Charges:  $Gait Training: 8-22 mins $Therapeutic Exercise: 8-22 mins                     Marcus Taylor, PTA Acute Rehabilitation Services Pager: [phone] Office: [phone]     Marcus Taylor 07/10/2018, 1:50 PM

## 2018-07-11 DIAGNOSIS — G4733 Obstructive sleep apnea (adult) (pediatric): Secondary | ICD-10-CM | POA: Diagnosis not present

## 2018-07-11 DIAGNOSIS — Z7902 Long term (current) use of antithrombotics/antiplatelets: Secondary | ICD-10-CM | POA: Diagnosis not present

## 2018-07-11 DIAGNOSIS — I252 Old myocardial infarction: Secondary | ICD-10-CM | POA: Diagnosis not present

## 2018-07-11 DIAGNOSIS — E119 Type 2 diabetes mellitus without complications: Secondary | ICD-10-CM | POA: Diagnosis not present

## 2018-07-11 DIAGNOSIS — D509 Iron deficiency anemia, unspecified: Secondary | ICD-10-CM | POA: Diagnosis not present

## 2018-07-11 DIAGNOSIS — Z96651 Presence of right artificial knee joint: Secondary | ICD-10-CM | POA: Diagnosis not present

## 2018-07-11 DIAGNOSIS — I4510 Unspecified right bundle-branch block: Secondary | ICD-10-CM | POA: Diagnosis not present

## 2018-07-11 DIAGNOSIS — Z8781 Personal history of (healed) traumatic fracture: Secondary | ICD-10-CM | POA: Diagnosis not present

## 2018-07-11 DIAGNOSIS — Z471 Aftercare following joint replacement surgery: Secondary | ICD-10-CM | POA: Diagnosis not present

## 2018-07-11 DIAGNOSIS — K5790 Diverticulosis of intestine, part unspecified, without perforation or abscess without bleeding: Secondary | ICD-10-CM | POA: Diagnosis not present

## 2018-07-11 DIAGNOSIS — Z7982 Long term (current) use of aspirin: Secondary | ICD-10-CM | POA: Diagnosis not present

## 2018-07-11 DIAGNOSIS — Z955 Presence of coronary angioplasty implant and graft: Secondary | ICD-10-CM | POA: Diagnosis not present

## 2018-07-11 DIAGNOSIS — N200 Calculus of kidney: Secondary | ICD-10-CM | POA: Diagnosis not present

## 2018-07-11 DIAGNOSIS — D61818 Other pancytopenia: Secondary | ICD-10-CM | POA: Diagnosis not present

## 2018-07-11 DIAGNOSIS — D696 Thrombocytopenia, unspecified: Secondary | ICD-10-CM | POA: Diagnosis not present

## 2018-07-11 DIAGNOSIS — Z7984 Long term (current) use of oral hypoglycemic drugs: Secondary | ICD-10-CM | POA: Diagnosis not present

## 2018-07-11 DIAGNOSIS — Z87891 Personal history of nicotine dependence: Secondary | ICD-10-CM | POA: Diagnosis not present

## 2018-07-11 DIAGNOSIS — I10 Essential (primary) hypertension: Secondary | ICD-10-CM | POA: Diagnosis not present

## 2018-07-11 DIAGNOSIS — H9193 Unspecified hearing loss, bilateral: Secondary | ICD-10-CM | POA: Diagnosis not present

## 2018-07-11 DIAGNOSIS — I2510 Atherosclerotic heart disease of native coronary artery without angina pectoris: Secondary | ICD-10-CM | POA: Diagnosis not present

## 2018-07-11 DIAGNOSIS — R161 Splenomegaly, not elsewhere classified: Secondary | ICD-10-CM | POA: Diagnosis not present

## 2018-07-16 DIAGNOSIS — D696 Thrombocytopenia, unspecified: Secondary | ICD-10-CM | POA: Diagnosis not present

## 2018-07-16 DIAGNOSIS — Z96651 Presence of right artificial knee joint: Secondary | ICD-10-CM | POA: Diagnosis not present

## 2018-07-16 DIAGNOSIS — I252 Old myocardial infarction: Secondary | ICD-10-CM | POA: Diagnosis not present

## 2018-07-16 DIAGNOSIS — H9193 Unspecified hearing loss, bilateral: Secondary | ICD-10-CM | POA: Diagnosis not present

## 2018-07-16 DIAGNOSIS — R161 Splenomegaly, not elsewhere classified: Secondary | ICD-10-CM | POA: Diagnosis not present

## 2018-07-16 DIAGNOSIS — I10 Essential (primary) hypertension: Secondary | ICD-10-CM | POA: Diagnosis not present

## 2018-07-16 DIAGNOSIS — Z7902 Long term (current) use of antithrombotics/antiplatelets: Secondary | ICD-10-CM | POA: Diagnosis not present

## 2018-07-16 DIAGNOSIS — Z7984 Long term (current) use of oral hypoglycemic drugs: Secondary | ICD-10-CM | POA: Diagnosis not present

## 2018-07-16 DIAGNOSIS — G4733 Obstructive sleep apnea (adult) (pediatric): Secondary | ICD-10-CM | POA: Diagnosis not present

## 2018-07-16 DIAGNOSIS — Z955 Presence of coronary angioplasty implant and graft: Secondary | ICD-10-CM | POA: Diagnosis not present

## 2018-07-16 DIAGNOSIS — Z87891 Personal history of nicotine dependence: Secondary | ICD-10-CM | POA: Diagnosis not present

## 2018-07-16 DIAGNOSIS — N200 Calculus of kidney: Secondary | ICD-10-CM | POA: Diagnosis not present

## 2018-07-16 DIAGNOSIS — Z471 Aftercare following joint replacement surgery: Secondary | ICD-10-CM | POA: Diagnosis not present

## 2018-07-16 DIAGNOSIS — D509 Iron deficiency anemia, unspecified: Secondary | ICD-10-CM | POA: Diagnosis not present

## 2018-07-16 DIAGNOSIS — Z8781 Personal history of (healed) traumatic fracture: Secondary | ICD-10-CM | POA: Diagnosis not present

## 2018-07-16 DIAGNOSIS — E119 Type 2 diabetes mellitus without complications: Secondary | ICD-10-CM | POA: Diagnosis not present

## 2018-07-16 DIAGNOSIS — K5790 Diverticulosis of intestine, part unspecified, without perforation or abscess without bleeding: Secondary | ICD-10-CM | POA: Diagnosis not present

## 2018-07-16 DIAGNOSIS — Z7982 Long term (current) use of aspirin: Secondary | ICD-10-CM | POA: Diagnosis not present

## 2018-07-16 DIAGNOSIS — D61818 Other pancytopenia: Secondary | ICD-10-CM | POA: Diagnosis not present

## 2018-07-16 DIAGNOSIS — I4510 Unspecified right bundle-branch block: Secondary | ICD-10-CM | POA: Diagnosis not present

## 2018-07-16 DIAGNOSIS — I2510 Atherosclerotic heart disease of native coronary artery without angina pectoris: Secondary | ICD-10-CM | POA: Diagnosis not present

## 2018-07-17 DIAGNOSIS — G4733 Obstructive sleep apnea (adult) (pediatric): Secondary | ICD-10-CM | POA: Diagnosis not present

## 2018-07-17 DIAGNOSIS — Z7902 Long term (current) use of antithrombotics/antiplatelets: Secondary | ICD-10-CM | POA: Diagnosis not present

## 2018-07-17 DIAGNOSIS — Z96651 Presence of right artificial knee joint: Secondary | ICD-10-CM | POA: Diagnosis not present

## 2018-07-17 DIAGNOSIS — Z955 Presence of coronary angioplasty implant and graft: Secondary | ICD-10-CM | POA: Diagnosis not present

## 2018-07-17 DIAGNOSIS — I4510 Unspecified right bundle-branch block: Secondary | ICD-10-CM | POA: Diagnosis not present

## 2018-07-17 DIAGNOSIS — D61818 Other pancytopenia: Secondary | ICD-10-CM | POA: Diagnosis not present

## 2018-07-17 DIAGNOSIS — Z7982 Long term (current) use of aspirin: Secondary | ICD-10-CM | POA: Diagnosis not present

## 2018-07-17 DIAGNOSIS — I10 Essential (primary) hypertension: Secondary | ICD-10-CM | POA: Diagnosis not present

## 2018-07-17 DIAGNOSIS — D696 Thrombocytopenia, unspecified: Secondary | ICD-10-CM | POA: Diagnosis not present

## 2018-07-17 DIAGNOSIS — H9193 Unspecified hearing loss, bilateral: Secondary | ICD-10-CM | POA: Diagnosis not present

## 2018-07-17 DIAGNOSIS — Z7984 Long term (current) use of oral hypoglycemic drugs: Secondary | ICD-10-CM | POA: Diagnosis not present

## 2018-07-17 DIAGNOSIS — Z87891 Personal history of nicotine dependence: Secondary | ICD-10-CM | POA: Diagnosis not present

## 2018-07-17 DIAGNOSIS — I252 Old myocardial infarction: Secondary | ICD-10-CM | POA: Diagnosis not present

## 2018-07-17 DIAGNOSIS — R161 Splenomegaly, not elsewhere classified: Secondary | ICD-10-CM | POA: Diagnosis not present

## 2018-07-17 DIAGNOSIS — I2510 Atherosclerotic heart disease of native coronary artery without angina pectoris: Secondary | ICD-10-CM | POA: Diagnosis not present

## 2018-07-17 DIAGNOSIS — Z471 Aftercare following joint replacement surgery: Secondary | ICD-10-CM | POA: Diagnosis not present

## 2018-07-17 DIAGNOSIS — K5790 Diverticulosis of intestine, part unspecified, without perforation or abscess without bleeding: Secondary | ICD-10-CM | POA: Diagnosis not present

## 2018-07-17 DIAGNOSIS — D509 Iron deficiency anemia, unspecified: Secondary | ICD-10-CM | POA: Diagnosis not present

## 2018-07-17 DIAGNOSIS — E119 Type 2 diabetes mellitus without complications: Secondary | ICD-10-CM | POA: Diagnosis not present

## 2018-07-17 DIAGNOSIS — N200 Calculus of kidney: Secondary | ICD-10-CM | POA: Diagnosis not present

## 2018-07-17 DIAGNOSIS — Z8781 Personal history of (healed) traumatic fracture: Secondary | ICD-10-CM | POA: Diagnosis not present

## 2018-07-19 ENCOUNTER — Encounter: Payer: Self-pay | Admitting: Podiatry

## 2018-07-19 ENCOUNTER — Ambulatory Visit: Payer: Medicare Other | Admitting: Podiatry

## 2018-07-19 DIAGNOSIS — B351 Tinea unguium: Secondary | ICD-10-CM

## 2018-07-19 DIAGNOSIS — M79676 Pain in unspecified toe(s): Secondary | ICD-10-CM

## 2018-07-19 DIAGNOSIS — D689 Coagulation defect, unspecified: Secondary | ICD-10-CM

## 2018-07-19 NOTE — Progress Notes (Signed)
Subjective: 77 y.o. returns the office today for painful, elongated, thickened toenails which he cannot trim himself. Denies any redness or drainage around the nails. Denies any acute changes since last appointment and no new complaints today. Denies any systemic complaints such as fevers, chills, nausea, vomiting.   He just had a right knee replacement last week, doing well.   He is on Plavix.   Objective: AAO 3, NAD DP/PT pulses palpable, CRT less than 3 seconds Nails hypertrophic, dystrophic, elongated, brittle, discolored 10. There is tenderness overlying the nails 1-5 bilaterally. There is no surrounding erythema or drainage along the nail sites. The right hallux toenail is ingrown but no signs of infection/drainage.  Hammertoes are present.  No open lesions or pre-ulcerative lesions are identified. No pain with calf compression, swelling, warmth, erythema. Otherwise no change  Assessment: 77 year old male presents with symptomatic onychomycosis  Plan: -Treatment options including alternatives, risks, complications were discussed -Nails sharply debrided 10 without complication/bleeding. After debridement of the right hallux ingrown toenail there was resolution of pain and no signs of infection.  -Discussed daily foot inspection. If there are any changes, to call the office immediately.  -Follow-up in 9 weeks for nail trim or sooner if any problems are to arise. In the meantime, encouraged to call the office with any questions, concerns, changes symptoms.  Celesta Gentile, DPM

## 2018-07-20 DIAGNOSIS — Z87891 Personal history of nicotine dependence: Secondary | ICD-10-CM | POA: Diagnosis not present

## 2018-07-20 DIAGNOSIS — H9193 Unspecified hearing loss, bilateral: Secondary | ICD-10-CM | POA: Diagnosis not present

## 2018-07-20 DIAGNOSIS — D509 Iron deficiency anemia, unspecified: Secondary | ICD-10-CM | POA: Diagnosis not present

## 2018-07-20 DIAGNOSIS — K5790 Diverticulosis of intestine, part unspecified, without perforation or abscess without bleeding: Secondary | ICD-10-CM | POA: Diagnosis not present

## 2018-07-20 DIAGNOSIS — Z7984 Long term (current) use of oral hypoglycemic drugs: Secondary | ICD-10-CM | POA: Diagnosis not present

## 2018-07-20 DIAGNOSIS — D696 Thrombocytopenia, unspecified: Secondary | ICD-10-CM | POA: Diagnosis not present

## 2018-07-20 DIAGNOSIS — N200 Calculus of kidney: Secondary | ICD-10-CM | POA: Diagnosis not present

## 2018-07-20 DIAGNOSIS — Z955 Presence of coronary angioplasty implant and graft: Secondary | ICD-10-CM | POA: Diagnosis not present

## 2018-07-20 DIAGNOSIS — Z471 Aftercare following joint replacement surgery: Secondary | ICD-10-CM | POA: Diagnosis not present

## 2018-07-20 DIAGNOSIS — R161 Splenomegaly, not elsewhere classified: Secondary | ICD-10-CM | POA: Diagnosis not present

## 2018-07-20 DIAGNOSIS — Z7982 Long term (current) use of aspirin: Secondary | ICD-10-CM | POA: Diagnosis not present

## 2018-07-20 DIAGNOSIS — D61818 Other pancytopenia: Secondary | ICD-10-CM | POA: Diagnosis not present

## 2018-07-20 DIAGNOSIS — G4733 Obstructive sleep apnea (adult) (pediatric): Secondary | ICD-10-CM | POA: Diagnosis not present

## 2018-07-20 DIAGNOSIS — I2510 Atherosclerotic heart disease of native coronary artery without angina pectoris: Secondary | ICD-10-CM | POA: Diagnosis not present

## 2018-07-20 DIAGNOSIS — I10 Essential (primary) hypertension: Secondary | ICD-10-CM | POA: Diagnosis not present

## 2018-07-20 DIAGNOSIS — I4510 Unspecified right bundle-branch block: Secondary | ICD-10-CM | POA: Diagnosis not present

## 2018-07-20 DIAGNOSIS — I252 Old myocardial infarction: Secondary | ICD-10-CM | POA: Diagnosis not present

## 2018-07-20 DIAGNOSIS — Z96651 Presence of right artificial knee joint: Secondary | ICD-10-CM | POA: Diagnosis not present

## 2018-07-20 DIAGNOSIS — Z8781 Personal history of (healed) traumatic fracture: Secondary | ICD-10-CM | POA: Diagnosis not present

## 2018-07-20 DIAGNOSIS — Z7902 Long term (current) use of antithrombotics/antiplatelets: Secondary | ICD-10-CM | POA: Diagnosis not present

## 2018-07-20 DIAGNOSIS — E119 Type 2 diabetes mellitus without complications: Secondary | ICD-10-CM | POA: Diagnosis not present

## 2018-07-22 ENCOUNTER — Other Ambulatory Visit: Payer: Self-pay | Admitting: Hematology and Oncology

## 2018-07-22 ENCOUNTER — Ambulatory Visit (INDEPENDENT_AMBULATORY_CARE_PROVIDER_SITE_OTHER): Payer: Medicare Other | Admitting: Orthopaedic Surgery

## 2018-07-22 ENCOUNTER — Inpatient Hospital Stay (INDEPENDENT_AMBULATORY_CARE_PROVIDER_SITE_OTHER): Payer: Medicare Other | Admitting: Orthopaedic Surgery

## 2018-07-22 ENCOUNTER — Encounter (INDEPENDENT_AMBULATORY_CARE_PROVIDER_SITE_OTHER): Payer: Self-pay | Admitting: Orthopaedic Surgery

## 2018-07-22 ENCOUNTER — Ambulatory Visit (INDEPENDENT_AMBULATORY_CARE_PROVIDER_SITE_OTHER): Payer: Medicare Other

## 2018-07-22 ENCOUNTER — Telehealth (INDEPENDENT_AMBULATORY_CARE_PROVIDER_SITE_OTHER): Payer: Self-pay | Admitting: *Deleted

## 2018-07-22 ENCOUNTER — Other Ambulatory Visit (INDEPENDENT_AMBULATORY_CARE_PROVIDER_SITE_OTHER): Payer: Self-pay | Admitting: *Deleted

## 2018-07-22 ENCOUNTER — Ambulatory Visit (HOSPITAL_COMMUNITY)
Admission: RE | Admit: 2018-07-22 | Discharge: 2018-07-22 | Disposition: A | Discharge status: Home / Self Care | Payer: Medicare Other | Source: Ambulatory Visit | Attending: Hematology and Oncology | Admitting: Hematology and Oncology

## 2018-07-22 ENCOUNTER — Encounter (HOSPITAL_COMMUNITY): Payer: Self-pay

## 2018-07-22 VITALS — BP 120/56 | HR 79

## 2018-07-22 DIAGNOSIS — Z96651 Presence of right artificial knee joint: Secondary | ICD-10-CM | POA: Diagnosis not present

## 2018-07-22 DIAGNOSIS — E119 Type 2 diabetes mellitus without complications: Secondary | ICD-10-CM | POA: Diagnosis not present

## 2018-07-22 DIAGNOSIS — M25561 Pain in right knee: Secondary | ICD-10-CM

## 2018-07-22 DIAGNOSIS — G4733 Obstructive sleep apnea (adult) (pediatric): Secondary | ICD-10-CM | POA: Diagnosis not present

## 2018-07-22 DIAGNOSIS — G8929 Other chronic pain: Secondary | ICD-10-CM | POA: Diagnosis not present

## 2018-07-22 DIAGNOSIS — D61818 Other pancytopenia: Secondary | ICD-10-CM | POA: Diagnosis not present

## 2018-07-22 DIAGNOSIS — I4510 Unspecified right bundle-branch block: Secondary | ICD-10-CM | POA: Diagnosis not present

## 2018-07-22 DIAGNOSIS — Z8781 Personal history of (healed) traumatic fracture: Secondary | ICD-10-CM | POA: Diagnosis not present

## 2018-07-22 DIAGNOSIS — R161 Splenomegaly, not elsewhere classified: Secondary | ICD-10-CM | POA: Diagnosis not present

## 2018-07-22 DIAGNOSIS — Z471 Aftercare following joint replacement surgery: Secondary | ICD-10-CM | POA: Diagnosis not present

## 2018-07-22 DIAGNOSIS — Z955 Presence of coronary angioplasty implant and graft: Secondary | ICD-10-CM | POA: Diagnosis not present

## 2018-07-22 DIAGNOSIS — I2510 Atherosclerotic heart disease of native coronary artery without angina pectoris: Secondary | ICD-10-CM | POA: Diagnosis not present

## 2018-07-22 DIAGNOSIS — C8307 Small cell B-cell lymphoma, spleen: Secondary | ICD-10-CM | POA: Diagnosis not present

## 2018-07-22 DIAGNOSIS — N200 Calculus of kidney: Secondary | ICD-10-CM | POA: Diagnosis not present

## 2018-07-22 DIAGNOSIS — H9193 Unspecified hearing loss, bilateral: Secondary | ICD-10-CM | POA: Diagnosis not present

## 2018-07-22 DIAGNOSIS — Z7902 Long term (current) use of antithrombotics/antiplatelets: Secondary | ICD-10-CM | POA: Diagnosis not present

## 2018-07-22 DIAGNOSIS — Z7982 Long term (current) use of aspirin: Secondary | ICD-10-CM | POA: Diagnosis not present

## 2018-07-22 DIAGNOSIS — I252 Old myocardial infarction: Secondary | ICD-10-CM | POA: Diagnosis not present

## 2018-07-22 DIAGNOSIS — Z87891 Personal history of nicotine dependence: Secondary | ICD-10-CM | POA: Diagnosis not present

## 2018-07-22 DIAGNOSIS — K5790 Diverticulosis of intestine, part unspecified, without perforation or abscess without bleeding: Secondary | ICD-10-CM | POA: Diagnosis not present

## 2018-07-22 DIAGNOSIS — D696 Thrombocytopenia, unspecified: Secondary | ICD-10-CM | POA: Diagnosis not present

## 2018-07-22 DIAGNOSIS — D509 Iron deficiency anemia, unspecified: Secondary | ICD-10-CM | POA: Diagnosis not present

## 2018-07-22 DIAGNOSIS — I10 Essential (primary) hypertension: Secondary | ICD-10-CM | POA: Diagnosis not present

## 2018-07-22 DIAGNOSIS — Z7984 Long term (current) use of oral hypoglycemic drugs: Secondary | ICD-10-CM | POA: Diagnosis not present

## 2018-07-22 MED ORDER — IOHEXOL 300 MG/ML  SOLN
100.0000 mL | Freq: Once | INTRAMUSCULAR | Status: AC | PRN
  Administered 2018-07-22: 100 mL via INTRAVENOUS

## 2018-07-22 MED ORDER — SODIUM CHLORIDE (PF) 0.9 % IJ SOLN
INTRAMUSCULAR | Status: AC
  Filled 2018-07-22: qty 50

## 2018-07-22 NOTE — Progress Notes (Signed)
Office Visit Note   Patient: Marcus Taylor           Date of Birth: 05/27/1941           MRN: [id number] Visit Date: 07/22/2018              Requested by: [name], DO [address] [place], [place] [address] PCP: [name], DO   Assessment & Plan: Visit Diagnoses:  1. Chronic pain of right knee   2. History of total right knee replacement     Plan: 2 weeks status post primary right total knee replacement and doing well.  Had some constipation over the weekend but that has resolved.  Has completed his course of in-home therapy and we will start therapy as an outpatient.  Weightbearing as tolerated with or without a cane.  Office 2 weeks.  Surgery went well.  There were multiple loose bodies in the popliteal space some of which were over a centimeter diameter.  He is may have one still in place deep in the popliteal space in a Baker's cyst.  Follow-Up Instructions: Return in about 2 weeks (around 08/05/2018).   Orders:  Orders Placed This Encounter  Procedures  . XR KNEE 3 VIEW RIGHT   No orders of the defined types were placed in this encounter.     Procedures: No procedures performed   Clinical Data: No additional findings.   Subjective: No chief complaint on file. Marcus Taylor is 2 weeks status post primary right total knee replacement and doing well.  Had some problems with constipation over the weekend that resolved.  Using a walker with almost full weightbearing.  Has completed his course of therapy.  Also using the CPM machine.  Minimal use of pain medicines at this point.  No related fever or chills  HPI  Review of Systems   Objective: Vital Signs: BP (!) 120/56 (BP Location: Left Arm, Patient Position: Sitting, Cuff Size: Normal)   Pulse 79   Physical Exam  Ortho Exam extension right knee in flexed over 90 degrees without instability.  Incision healing without problems.  Clips removed and Steri-Strips applied.  Neurologically  intact.  Specialty Comments:  No specialty comments available.  Imaging: Ct Abdomen Pelvis W Contrast  Result Date: 07/22/2018 CLINICAL DATA:  History of splenic marginal zone B-cell lymphoma. Follow-up splenomegaly. EXAM: CT ABDOMEN AND PELVIS WITH CONTRAST TECHNIQUE: Multidetector CT imaging of the abdomen and pelvis was performed using the standard protocol following bolus administration of intravenous contrast. CONTRAST:  187mL OMNIPAQUE IOHEXOL 300 MG/ML  SOLN COMPARISON:  CT 03/05/2018.  No other comparison CTs available. FINDINGS: Lower chest: Prominent three-vessel coronary artery atherosclerosis. There's lesser atherosclerosis of the visualized thoracic aorta. There is stable mild scarring at both lung bases. No significant pleural or pericardial effusion. A sebaceous cyst is noted in the lower right back which appears unchanged. Hepatobiliary: Mild contour irregularity of the liver with relative enlargement the left and caudate lobes. No focal hepatic abnormalities are identified. There is stable mild intrahepatic and extrahepatic biliary dilatation status post cholecystectomy, within physiologic limits. Pancreas: Unremarkable. No pancreatic ductal dilatation or surrounding inflammatory changes. Spleen: Measures 22.4 x 16.6 x 8.9 cm (volume = 1700 cm^3) consistent with moderate splenomegaly. No focal abnormality. Adrenals/Urinary Tract: Both adrenal glands appear normal. Both kidneys demonstrate mild cortical thinning and scattered low-density lesions which are too small to optimally characterize, although likely all cysts. No evidence of urinary tract calculus or hydronephrosis. The bladder  appears normal. Stomach/Bowel: The stomach, small bowel, appendix and proximal colon appear normal. There are scattered diverticular changes in the descending and sigmoid colon. There is diffuse rectal wall thickening with possible mild inflammation in the perirectal fat. No evidence of bowel obstruction or  perforation. Vascular/Lymphatic: There are no enlarged abdominal lymph nodes. There are mildly prominent right external iliac nodes, measuring 9 mm on image 80/2 and 8 mm on image 81/2. There is diffuse aortic and branch vessel atherosclerosis. No acute vascular findings. Reproductive: Stable mild prostate gland. Other: No evidence of abdominal wall mass or hernia. No ascites. Musculoskeletal: No acute or significant osseous findings. Multilevel spondylosis status post L3-4 and L4-5 laminectomy. IMPRESSION: 1. Stable nonspecific splenomegaly without focal abnormality. Given suspected changes of hepatic cirrhosis, this may be secondary to portal hypertension. 2. Mildly prominent right external iliac lymph nodes. No other adenopathy to suggest recurrent lymphoma. 3. Diffuse rectal wall thickening with mild soft tissue stranding in the perirectal fat. This appearance is most consistent with proctitis. Clinical follow-up recommended to exclude neoplasm. 4. Stable mild biliary dilatation post cholecystectomy, within physiologic limits. 5. Coronary and Aortic Atherosclerosis (ICD10-I70.0). 6. These results will be called to the ordering clinician or representative by the Radiologist Assistant, and communication documented in the PACS or zVision Dashboard. Electronically Signed   By: Richardean Sale M.D.   On: 07/22/2018 14:11   Xr Knee 3 View Right  Result Date: 07/22/2018 Films of the right total knee replaced reveal good position of the components.  There may be another loose body in the popliteal space versus a sesamoid bone.  No obvious complications    PMFS History: Patient Active Problem List   Diagnosis Date Noted  . History of total right knee replacement 07/09/2018  . Overweight (BMI 25.0-29.9) 05/22/2018  . Pancytopenia, acquired ([place]) 04/19/2018  . Easy bruising 04/19/2018  . Primary osteoarthritis of right knee 04/19/2018  . Decreased hearing of both ears 03/20/2018  . Splenomegaly 03/12/2018   . Enlarged prostate without lower urinary tract symptoms (luts) 03/12/2018  . Subarachnoid hematoma ([place]) 03/12/2018  . Neck pain 02/19/2018  . IDA (iron deficiency anemia) 08/09/2016  . Thrombocytopenia ([place]) 05/30/2016  . Diabetes mellitus type 2, controlled, with complications ([place]) 57/84/6962  . Right inguinal hernia 02/03/2016  . History of colon polyps 01/27/2016  . History of ST elevation myocardial infarction (STEMI) 01/27/2016  . CAD (coronary artery disease) 01/27/2016  . Hyperlipidemia 01/27/2016  . Anemia in neoplastic disease 05/25/2014  . Splenic marginal zone b-cell lymphoma ([place]) 12/18/2011  . HTN (hypertension)    Past Medical History:  Diagnosis Date  . Anemia   . Arthritis   . Colonic polyp   . Concussion 03/05/2018  . Coronary artery disease    stents x 3 (03-2014)  . Diabetes mellitus   . Diverticulosis    throughout entire colon  . HTN (hypertension)   . Hypertriglyceridemia   . Kidney stone   . Lymphocytosis   . Metatarsal fracture left   left 5th proximal phalanx   . OSA (obstructive sleep apnea)    "Mild" by records  . PONV (postoperative nausea and vomiting)    first time, no problem since then  . RBBB 01/2016  . Splenic marginal zone b-cell lymphoma ([place]) 12/18/2011  . Splenomegaly   . STEMI (ST elevation myocardial infarction) [facility])    August 2015  . Subarachnoid hemorrhage ([place]) 03/05/2018    Family History  Problem Relation Age of Onset  . Kidney disease Sister   .  Colon cancer Sister 78  . Colon cancer Brother   . Bone cancer Brother   . Arthritis Mother   . Breast cancer Mother   . Heart disease Mother   . Early death Father   . Arthritis Son   . Esophageal cancer Sister   . Bladder Cancer Sister   . Rectal cancer Neg Hx   . Stomach cancer Neg Hx     Past Surgical History:  Procedure Laterality Date  . CARDIAC CATHETERIZATION     stents x 3 (03/2014)  . CHOLECYSTECTOMY    . HERNIA REPAIR    . INGUINAL HERNIA REPAIR Right  05/27/2015   Procedure: LAPAROSCOPIC REPAIR RIGHT  INGUINAL HERNIA;  Surgeon: Greer Pickerel, MD;  Location: [facility];  Service: General;  Laterality: Right;  . INSERTION OF MESH Right 05/27/2015   Procedure: INSERTION OF MESH;  Surgeon: Greer Pickerel, MD;  Location: [facility];  Service: General;  Laterality: Right;  . JOINT REPLACEMENT    . LUMBAR DISC SURGERY    . TONSILLECTOMY AND ADENOIDECTOMY    . TOTAL KNEE ARTHROPLASTY Right 07/09/2018  . TOTAL KNEE ARTHROPLASTY Right 07/09/2018   Procedure: RIGHT TOTAL KNEE ARTHROPLASTY;  Surgeon: Garald Balding, MD;  Location: [facility];  Service: Orthopedics;  Laterality: Right;   Social History   Occupational History    Comment: used to work as an Proofreader; now retired  Tobacco Use  . Smoking status: Former Smoker    Packs/day: 1.00    Years: 20.00    Pack years: 20.00    Last attempt to quit: 08/14/1988    Years since quitting: 29.9  . Smokeless tobacco: Never Used  Substance and Sexual Activity  . Alcohol use: No  . Drug use: No  . Sexual activity: Yes    Birth control/protection: None     Garald Balding, MD   Note - This record has been created using [employer].  Chart creation errors have been sought, but may not always  have been located. Such creation errors do not reflect on  the standard of medical care.

## 2018-07-22 NOTE — Telephone Encounter (Signed)
Patient Marcus Taylor, patient having difficulty with bowel movements, Dr. Durward Fortes advised patient needs to contact PCP, I called patient.

## 2018-07-23 ENCOUNTER — Telehealth (INDEPENDENT_AMBULATORY_CARE_PROVIDER_SITE_OTHER): Payer: Self-pay | Admitting: Orthopaedic Surgery

## 2018-07-23 NOTE — Telephone Encounter (Signed)
I called patient, patient does not need to wear compression socks.

## 2018-07-23 NOTE — Telephone Encounter (Signed)
Patient forgot to ask if he is to continue to wear the compression stockings. Please call to advise.

## 2018-07-25 ENCOUNTER — Inpatient Hospital Stay (HOSPITAL_BASED_OUTPATIENT_CLINIC_OR_DEPARTMENT_OTHER): Payer: Medicare Other | Admitting: Hematology and Oncology

## 2018-07-25 ENCOUNTER — Telehealth: Payer: Self-pay | Admitting: Hematology and Oncology

## 2018-07-25 ENCOUNTER — Encounter: Payer: Self-pay | Admitting: Hematology and Oncology

## 2018-07-25 ENCOUNTER — Telehealth: Payer: Self-pay

## 2018-07-25 ENCOUNTER — Inpatient Hospital Stay: Payer: Medicare Other | Attending: Hematology and Oncology

## 2018-07-25 DIAGNOSIS — Z7982 Long term (current) use of aspirin: Secondary | ICD-10-CM | POA: Diagnosis not present

## 2018-07-25 DIAGNOSIS — E118 Type 2 diabetes mellitus with unspecified complications: Secondary | ICD-10-CM | POA: Insufficient documentation

## 2018-07-25 DIAGNOSIS — Z79899 Other long term (current) drug therapy: Secondary | ICD-10-CM | POA: Diagnosis not present

## 2018-07-25 DIAGNOSIS — K7460 Unspecified cirrhosis of liver: Secondary | ICD-10-CM

## 2018-07-25 DIAGNOSIS — K5909 Other constipation: Secondary | ICD-10-CM | POA: Diagnosis not present

## 2018-07-25 DIAGNOSIS — C8307 Small cell B-cell lymphoma, spleen: Secondary | ICD-10-CM

## 2018-07-25 DIAGNOSIS — Z7984 Long term (current) use of oral hypoglycemic drugs: Secondary | ICD-10-CM | POA: Insufficient documentation

## 2018-07-25 DIAGNOSIS — Z86010 Personal history of colonic polyps: Secondary | ICD-10-CM

## 2018-07-25 DIAGNOSIS — D509 Iron deficiency anemia, unspecified: Secondary | ICD-10-CM

## 2018-07-25 LAB — CBC WITH DIFFERENTIAL/PLATELET
Abs Immature Granulocytes: 0.06 10*3/uL (ref 0.00–0.07)
BASOS ABS: 0.1 10*3/uL (ref 0.0–0.1)
BASOS PCT: 1 %
EOS ABS: 0.1 10*3/uL (ref 0.0–0.5)
EOS PCT: 1 %
HCT: 36.7 % — ABNORMAL LOW (ref 39.0–52.0)
HEMOGLOBIN: 11.4 g/dL — AB (ref 13.0–17.0)
IMMATURE GRANULOCYTES: 0 %
Lymphocytes Relative: 73 %
Lymphs Abs: 12.9 10*3/uL — ABNORMAL HIGH (ref 0.7–4.0)
MCH: 27.4 pg (ref 26.0–34.0)
MCHC: 31.1 g/dL (ref 30.0–36.0)
MCV: 88.2 fL (ref 80.0–100.0)
MONOS PCT: 5 %
Monocytes Absolute: 0.8 10*3/uL (ref 0.1–1.0)
NEUTROS PCT: 20 %
Neutro Abs: 3.5 10*3/uL (ref 1.7–7.7)
Platelets: 302 10*3/uL (ref 150–400)
RBC: 4.16 MIL/uL — ABNORMAL LOW (ref 4.22–5.81)
RDW: 15.4 % (ref 11.5–15.5)
WBC: 17.5 10*3/uL — AB (ref 4.0–10.5)
nRBC: 0 % (ref 0.0–0.2)

## 2018-07-25 LAB — URIC ACID: Uric Acid, Serum: 4.2 mg/dL (ref 3.7–8.6)

## 2018-07-25 LAB — COMPREHENSIVE METABOLIC PANEL
ALBUMIN: 3.6 g/dL (ref 3.5–5.0)
ALK PHOS: 86 U/L (ref 38–126)
ALT: 18 U/L (ref 0–44)
AST: 30 U/L (ref 15–41)
Anion gap: 10 (ref 5–15)
BUN: 13 mg/dL (ref 8–23)
CO2: 29 mmol/L (ref 22–32)
CREATININE: 0.81 mg/dL (ref 0.61–1.24)
Calcium: 9.2 mg/dL (ref 8.9–10.3)
Chloride: 104 mmol/L (ref 98–111)
GFR calc Af Amer: >60 mL/min (ref >60)
Glucose, Bld: 131 mg/dL — ABNORMAL HIGH (ref 70–99)
Potassium: 3.8 mmol/L (ref 3.5–5.1)
SODIUM: 143 mmol/L (ref 135–145)
Total Bilirubin: 0.6 mg/dL (ref 0.3–1.2)
Total Protein: 6.5 g/dL (ref 6.5–8.1)

## 2018-07-25 LAB — LACTATE DEHYDROGENASE: LDH: 226 U/L — AB (ref 98–192)

## 2018-07-25 NOTE — Progress Notes (Signed)
[place] OFFICE PROGRESS NOTE  Patient Care Team: [name], DO as PCP - General (Family Medicine) [name], MD (Inactive) as Consulting Physician (Gastroenterology) [name], MD as Consulting Physician (Hematology and Oncology) [name], MD as Referring Physician (Cardiology) [name], MD as Consulting Physician (Neurosurgery) [name], OD as Referring Physician Garald Balding, MD as Consulting Physician (Orthopedic Surgery) Trula Slade, DPM as Consulting Physician (Podiatry)  ASSESSMENT & PLAN:  Splenic marginal zone b-cell lymphoma [facility]) He has stable blood count since he has been placed on prednisone The splenomegaly has not improved likely due to secondary process from liver cirrhosis We discussed the risk of benefits of discontinuation of treatment and he agreed with the plan of care I will initiate slow prednisone taper I have given him written instruction I plan to see him back in 2 months for further follow-up  History of colon polyps The patient had recent EGD and colonoscopy in 2018 with multiple polyps He has significant severe constipation Recent CT imaging studies were reviewed with the patient and family which show some abnormal inflammatory changes I recommend follow-up appointment with his GI physician and he agreed In the meantime, I recommend laxative therapy  Diabetes mellitus type 2, controlled, with complications ([place]) The patient has progressive weight gain secondary to side effects of prednisone We discussed prednisone taper and dietary modification and weight loss strategy Suspect his chronic diabetes might have caused nonalcoholic steatosis and that itself caused chronic liver cirrhosis  Liver cirrhosis ([place]) CT imaging showed liver cirrhosis Most likely cause is due to NASH We discussed dietary modification and weight loss strategy His liver enzymes are within acceptable limits   No orders  of the defined types were placed in this encounter.   INTERVAL HISTORY: Please see below for problem oriented charting. He returns for further follow-up with his wife and daughter He had recent knee surgery and is recovering well The patient has chronic constipation Denies recent abdominal pain or rectal bleeding No new lymphadenopathy Denies recent infection  SUMMARY OF ONCOLOGIC HISTORY:  This is a very pleasant gentleman who was found to have abnormal CBC for the past 5 years. In April 2013, flow cytometry detectable monoclonal B-cell population suspicious for splenic/marginal zone lymphoma. Ultrasound confirmed splenomegaly. Due to lack of symptoms he was being observed. In October 2017, he was noted to have microcytic, iron deficiency anemia. He was started on oral iron supplement On 03/05/2018, the patient had an accidental fall.  CT imaging revealed significant splenomegaly On 03/14/2018, he was started on 20 mg of prednisone daily On April 18, 2018, the dose of prednisone was reduced to 10 mg daily In December 2019, CT imaging show chronic liver cirrhosis and splenomegaly without significant lymphadenopathy  REVIEW OF SYSTEMS:   Constitutional: Denies fevers, chills or abnormal weight loss Eyes: Denies blurriness of vision Ears, nose, mouth, throat, and face: Denies mucositis or sore throat Respiratory: Denies cough, dyspnea or wheezes Cardiovascular: Denies palpitation, chest discomfort or lower extremity swelling Skin: Denies abnormal skin rashes Lymphatics: Denies new lymphadenopathy or easy bruising Neurological:Denies numbness, tingling or new weaknesses Behavioral/Psych: Mood is stable, no new changes  All other systems were reviewed with the patient and are negative.  I have reviewed the past medical history, past surgical history, social history and family history with the patient and they are unchanged from previous note.  ALLERGIES:  has No Known  Allergies.  MEDICATIONS:  Current Outpatient Medications  Medication Sig  Dispense Refill  . acetaminophen (TYLENOL) 325 MG tablet Take 2 tablets (650 mg total) by mouth every 6 (six) hours. 100 tablet 0  . aspirin EC 81 MG tablet Take 81 mg by mouth daily.    Marland Kitchen atorvastatin (LIPITOR) 40 MG tablet Take 1 tablet (40 mg total) by mouth daily. 90 tablet 3  . clopidogrel (PLAVIX) 75 MG tablet Take 75 mg by mouth daily.    . ferrous sulfate 325 (65 FE) MG tablet Take 325 mg by mouth daily with breakfast.    . lisinopril (PRINIVIL,ZESTRIL) 30 MG tablet Take 1 tablet (30 mg total) by mouth daily. 90 tablet 1  . metFORMIN (GLUCOPHAGE-XR) 500 MG 24 hr tablet Take 1 tablet (500 mg total) by mouth daily with breakfast. TAKE 1 TABLET BY MOUTH WITH EVENING MEAL M-W-F (Patient taking differently: Take 500 mg by mouth every Monday, Wednesday, and Friday. TAKE 1 TABLET BY MOUTH WITH EVENING MEAL M-W-F) 45 tablet 2  . methocarbamol (ROBAXIN) 500 MG tablet Take 1 tablet (500 mg total) by mouth every 8 (eight) hours as needed for muscle spasms. 30 tablet 0  . metoprolol tartrate (LOPRESSOR) 25 MG tablet Take 0.5 tablets (12.5 mg total) by mouth 2 (two) times daily. 90 tablet 1  . nitroGLYCERIN (NITROSTAT) 0.4 MG SL tablet Place 1 tablet (0.4 mg total) under the tongue every 5 (five) minutes as needed for chest pain. 10 tablet 0  . ondansetron (ZOFRAN ODT) 4 MG disintegrating tablet Take 1 tablet (4 mg total) by mouth every 8 (eight) hours as needed for nausea or vomiting. 20 tablet 0  . oxyCODONE (OXY IR/ROXICODONE) 5 MG immediate release tablet Take 1-2 tablets (5-10 mg total) by mouth every 4 (four) hours as needed for severe pain (pain score 7-10). 60 tablet 0  . predniSONE (DELTASONE) 10 MG tablet Take 1 tablet (10 mg total) by mouth daily with breakfast. 30 tablet 1   No current facility-administered medications for this visit.     PHYSICAL EXAMINATION: ECOG PERFORMANCE STATUS: 1 - Symptomatic but completely  ambulatory  Vitals:   07/25/18 0934  BP: (!) 143/77  Pulse: 66  Resp: 18  Temp: (!) 97.4 F (36.3 C)  SpO2: 99%   Filed Weights   07/25/18 0934  Weight: 210 lb 3.2 oz (95.3 kg)    GENERAL:alert, no distress and comfortable SKIN: skin color, texture, turgor are normal, no rashes or significant lesions Musculoskeletal:no cyanosis of digits and no clubbing  NEURO: alert & oriented x 3 with fluent speech, no focal motor/sensory deficits  LABORATORY DATA:  I have reviewed the data as listed    Component Value Date/Time   NA 143 07/25/2018 0846   NA 144 05/31/2015 1404   K 3.8 07/25/2018 0846   K 4.7 05/31/2015 1404   CL 104 07/25/2018 0846   CL 102 10/07/2012 1013   CO2 29 07/25/2018 0846   CO2 29 05/31/2015 1404   GLUCOSE 131 (H) 07/25/2018 0846   GLUCOSE 99 05/31/2015 1404   GLUCOSE 199 (H) 10/07/2012 1013   BUN 13 07/25/2018 0846   BUN 18.3 05/31/2015 1404   CREATININE 0.81 07/25/2018 0846   CREATININE 0.86 03/13/2017 0759   CREATININE 0.9 05/31/2015 1404   CALCIUM 9.2 07/25/2018 0846   CALCIUM 9.2 05/31/2015 1404   PROT 6.5 07/25/2018 0846   PROT 6.3 (L) 05/31/2015 1404   ALBUMIN 3.6 07/25/2018 0846   ALBUMIN 3.9 05/31/2015 1404   AST 30 07/25/2018 0846   AST 28 05/31/2015 1404  ALT 18 07/25/2018 0846   ALT 23 05/31/2015 1404   ALKPHOS 86 07/25/2018 0846   ALKPHOS 79 05/31/2015 1404   BILITOT 0.6 07/25/2018 0846   BILITOT 0.46 05/31/2015 1404   GFRNONAA >60 07/25/2018 0846   GFRNONAA 85 03/13/2017 0759   GFRAA >60 07/25/2018 0846   GFRAA >89 03/13/2017 0759    No results found for: SPEP, UPEP  Lab Results  Component Value Date   WBC 17.5 (H) 07/25/2018   NEUTROABS 3.5 07/25/2018   HGB 11.4 (L) 07/25/2018   HCT 36.7 (L) 07/25/2018   MCV 88.2 07/25/2018   PLT 302 07/25/2018      Chemistry      Component Value Date/Time   NA 143 07/25/2018 0846   NA 144 05/31/2015 1404   K 3.8 07/25/2018 0846   K 4.7 05/31/2015 1404   CL 104 07/25/2018 0846    CL 102 10/07/2012 1013   CO2 29 07/25/2018 0846   CO2 29 05/31/2015 1404   BUN 13 07/25/2018 0846   BUN 18.3 05/31/2015 1404   CREATININE 0.81 07/25/2018 0846   CREATININE 0.86 03/13/2017 0759   CREATININE 0.9 05/31/2015 1404      Component Value Date/Time   CALCIUM 9.2 07/25/2018 0846   CALCIUM 9.2 05/31/2015 1404   ALKPHOS 86 07/25/2018 0846   ALKPHOS 79 05/31/2015 1404   AST 30 07/25/2018 0846   AST 28 05/31/2015 1404   ALT 18 07/25/2018 0846   ALT 23 05/31/2015 1404   BILITOT 0.6 07/25/2018 0846   BILITOT 0.46 05/31/2015 1404       RADIOGRAPHIC STUDIES: I have reviewed multiple imaging studies with patient and family I have personally reviewed the radiological images as listed and agreed with the findings in the report. Ct Abdomen Pelvis W Contrast  Result Date: 07/22/2018 CLINICAL DATA:  History of splenic marginal zone B-cell lymphoma. Follow-up splenomegaly. EXAM: CT ABDOMEN AND PELVIS WITH CONTRAST TECHNIQUE: Multidetector CT imaging of the abdomen and pelvis was performed using the standard protocol following bolus administration of intravenous contrast. CONTRAST:  14mL OMNIPAQUE IOHEXOL 300 MG/ML  SOLN COMPARISON:  CT 03/05/2018.  No other comparison CTs available. FINDINGS: Lower chest: Prominent three-vessel coronary artery atherosclerosis. There's lesser atherosclerosis of the visualized thoracic aorta. There is stable mild scarring at both lung bases. No significant pleural or pericardial effusion. A sebaceous cyst is noted in the lower right back which appears unchanged. Hepatobiliary: Mild contour irregularity of the liver with relative enlargement the left and caudate lobes. No focal hepatic abnormalities are identified. There is stable mild intrahepatic and extrahepatic biliary dilatation status post cholecystectomy, within physiologic limits. Pancreas: Unremarkable. No pancreatic ductal dilatation or surrounding inflammatory changes. Spleen: Measures 22.4 x 16.6 x  8.9 cm (volume = 1700 cm^3) consistent with moderate splenomegaly. No focal abnormality. Adrenals/Urinary Tract: Both adrenal glands appear normal. Both kidneys demonstrate mild cortical thinning and scattered low-density lesions which are too small to optimally characterize, although likely all cysts. No evidence of urinary tract calculus or hydronephrosis. The bladder appears normal. Stomach/Bowel: The stomach, small bowel, appendix and proximal colon appear normal. There are scattered diverticular changes in the descending and sigmoid colon. There is diffuse rectal wall thickening with possible mild inflammation in the perirectal fat. No evidence of bowel obstruction or perforation. Vascular/Lymphatic: There are no enlarged abdominal lymph nodes. There are mildly prominent right external iliac nodes, measuring 9 mm on image 80/2 and 8 mm on image 81/2. There is diffuse aortic and branch vessel atherosclerosis.  No acute vascular findings. Reproductive: Stable mild prostate gland. Other: No evidence of abdominal wall mass or hernia. No ascites. Musculoskeletal: No acute or significant osseous findings. Multilevel spondylosis status post L3-4 and L4-5 laminectomy. IMPRESSION: 1. Stable nonspecific splenomegaly without focal abnormality. Given suspected changes of hepatic cirrhosis, this may be secondary to portal hypertension. 2. Mildly prominent right external iliac lymph nodes. No other adenopathy to suggest recurrent lymphoma. 3. Diffuse rectal wall thickening with mild soft tissue stranding in the perirectal fat. This appearance is most consistent with proctitis. Clinical follow-up recommended to exclude neoplasm. 4. Stable mild biliary dilatation post cholecystectomy, within physiologic limits. 5. Coronary and Aortic Atherosclerosis (ICD10-I70.0). 6. These results will be called to the ordering clinician or representative by the Radiologist Assistant, and communication documented in the PACS or zVision  Dashboard. Electronically Signed   By: Richardean Sale M.D.   On: 07/22/2018 14:11   Xr Knee 1-2 Views Right  Result Date: 06/26/2018 2 view x-ray of the right knee reveals bone-on-bone medial compartment.  There are periarticular spurring both medially and laterally.  Market sclerosing especially in the lateral compartment.  Calcification is noted in the popliteal area on the lateral.  Xr Knee 3 View Right  Result Date: 07/22/2018 Films of the right total knee replaced reveal good position of the components.  There may be another loose body in the popliteal space versus a sesamoid bone.  No obvious complications   All questions were answered. The patient knows to call the clinic with any problems, questions or concerns. No barriers to learning was detected.  I spent 15 minutes counseling the patient face to face. The total time spent in the appointment was 20 minutes and more than 50% was on counseling and review of test results  [name], MD 07/25/2018 12:21 PM

## 2018-07-25 NOTE — Assessment & Plan Note (Signed)
The patient has progressive weight gain secondary to side effects of prednisone We discussed prednisone taper and dietary modification and weight loss strategy Suspect his chronic diabetes might have caused nonalcoholic steatosis and that itself caused chronic liver cirrhosis

## 2018-07-25 NOTE — Telephone Encounter (Signed)
Scheduled appt per 12/12 los - gave patient AVS and calender per los.

## 2018-07-25 NOTE — Telephone Encounter (Signed)
-----   Message from Yetta Flock, MD sent at 07/25/2018 12:53 PM EST ----- Regarding: FW: mutual patient Jan can you please contact this patient - needs follow up with me or one of the APPs in the next few weeks if possible. Thanks ----- Message ----- From: [name], MD Sent: 07/25/2018  12:23 PM EST To: Yetta Flock, MD Subject: mutual patient                                 Hi,  I just saw him recently for lymphoma. I am wondering whether you can see him to address the abnormal rectal changes, constipation and liver cirrhosis?  Thanks Ni

## 2018-07-25 NOTE — Assessment & Plan Note (Signed)
The patient had recent EGD and colonoscopy in 2018 with multiple polyps He has significant severe constipation Recent CT imaging studies were reviewed with the patient and family which show some abnormal inflammatory changes I recommend follow-up appointment with his GI physician and he agreed In the meantime, I recommend laxative therapy

## 2018-07-25 NOTE — Telephone Encounter (Signed)
Called pt and scheduled him for an appt next week on Wednesday, 07-31-18 with Ellouise Newer.

## 2018-07-25 NOTE — Assessment & Plan Note (Signed)
He has stable blood count since he has been placed on prednisone The splenomegaly has not improved likely due to secondary process from liver cirrhosis We discussed the risk of benefits of discontinuation of treatment and he agreed with the plan of care I will initiate slow prednisone taper I have given him written instruction I plan to see him back in 2 months for further follow-up

## 2018-07-25 NOTE — Assessment & Plan Note (Signed)
CT imaging showed liver cirrhosis Most likely cause is due to NASH We discussed dietary modification and weight loss strategy His liver enzymes are within acceptable limits

## 2018-07-26 ENCOUNTER — Other Ambulatory Visit: Payer: Self-pay

## 2018-07-26 ENCOUNTER — Telehealth: Payer: Self-pay

## 2018-07-26 MED ORDER — CLOPIDOGREL BISULFATE 75 MG PO TABS: 75.0000 mg | ORAL_TABLET | Freq: Every day | ORAL | 0 refills | Status: DC

## 2018-07-26 NOTE — Telephone Encounter (Signed)
Pt pharmacy [facility] requesting a rf on pts Clopidogrel. Rfed #[id number] no additional rfs.

## 2018-07-30 DIAGNOSIS — M25561 Pain in right knee: Secondary | ICD-10-CM | POA: Diagnosis not present

## 2018-07-30 DIAGNOSIS — G8929 Other chronic pain: Secondary | ICD-10-CM | POA: Diagnosis not present

## 2018-07-31 ENCOUNTER — Ambulatory Visit: Payer: Medicare Other | Admitting: Physician Assistant

## 2018-07-31 ENCOUNTER — Encounter: Payer: Self-pay | Admitting: Physician Assistant

## 2018-07-31 VITALS — BP 140/70 | HR 72 | Ht 68.5 in | Wt 209.5 lb

## 2018-07-31 DIAGNOSIS — R935 Abnormal findings on diagnostic imaging of other abdominal regions, including retroperitoneum: Secondary | ICD-10-CM

## 2018-07-31 DIAGNOSIS — K5900 Constipation, unspecified: Secondary | ICD-10-CM | POA: Diagnosis not present

## 2018-07-31 NOTE — Progress Notes (Signed)
Chief Complaint: Abnormal CT of the abdomen pelvis, constipation  HPI:    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 Havery Taylor and presents to clinic today for recent abnormal CT of the abdomen and pelvis with contrast.    07/22/2018 CT abdomen and pelvis done for his history of lymphoma.  Stable nonspecific splenomegaly without focal abnormality, thought secondary to possible portal hypertension given changes of hepatic cirrhosis.  Mildly prominent right external iliac lymph nodes, diffuse rectal wall thickening with mild soft tissue stranding in the perirectal fat, appearance was consistent with proctitis (this was not present on CT abdomen pelvis 03/05/2018) it was recommended he have follow-up to exclude neoplasm.  Stable mild biliary dilatation post cholecystectomy within physiologic limits, coronary and aortic atherosclerosis.    04/18/2017 colonoscopy with Dr. Havery Taylor done for iron deficiency anemia with three 3 mm polyps in the ascending colon, two 5-6 mm polyps in the ascending colon, one 3 mm polyp in the rectum, diverticulosis in the entire examined colon, redundant colon and internal hemorrhoids.  Pathology showed tubular adenomas.    Today, patient explains that about 3 weeks ago just before Thanksgiving he had a knee operation and afterwards was placed on pain meds, still using Oxycodone once daily and this made him severely constipated.  Explains that he went over a week without a bowel movement and on 07/16/2018 took multiple over-the-counter laxatives and stool softeners which eventually allowed him to pass his stool.  Since then he has continued with some constipation but reports 2 stools so far this week with the use of MiraLAX once.  Explains that prior to knee procedure he was always somewhat constipated with a bowel movement typically once a week.    Denies fever, chills,  weight loss, anorexia, nausea, vomiting, rectal pain or symptoms that awaken him from sleep.   Past Medical History:  Diagnosis Date  . Anemia   . Arthritis   . Colonic polyp   . Concussion 03/05/2018  . Coronary artery disease    stents x 3 (03-2014)  . Diabetes mellitus   . Diverticulosis    throughout entire colon  . HTN (hypertension)   . Hypertriglyceridemia   . Kidney stone   . Lymphocytosis   . Metatarsal fracture left   left 5th proximal phalanx   . OSA (obstructive sleep apnea)    "Mild" by records  . PONV (postoperative nausea and vomiting)    first time, no problem since then  . RBBB 01/2016  . Splenic marginal zone b-cell lymphoma ([place]) 12/18/2011  . Splenomegaly   . STEMI (ST elevation myocardial infarction) [facility])    August 2015  . Subarachnoid hemorrhage ([place]) 03/05/2018    Past Surgical History:  Procedure Laterality Date  . CARDIAC CATHETERIZATION     stents x 3 (03/2014)  . CHOLECYSTECTOMY    . HERNIA REPAIR    . INGUINAL HERNIA REPAIR Right 05/27/2015   Procedure: LAPAROSCOPIC REPAIR RIGHT  INGUINAL HERNIA;  Surgeon: Marcus Pickerel, MD;  Location: [facility];  Service: General;  Laterality: Right;  . INSERTION OF MESH Right 05/27/2015   Procedure: INSERTION OF MESH;  Surgeon: Marcus Pickerel, MD;  Location: [facility];  Service: General;  Laterality: Right;  . JOINT REPLACEMENT    . LUMBAR DISC SURGERY    . TONSILLECTOMY AND ADENOIDECTOMY    . TOTAL KNEE ARTHROPLASTY Right 07/09/2018  . TOTAL KNEE ARTHROPLASTY Right  07/09/2018   Procedure: RIGHT TOTAL KNEE ARTHROPLASTY;  Surgeon: Marcus Balding, MD;  Location: [facility];  Service: Orthopedics;  Laterality: Right;    Current Outpatient Medications  Medication Sig Dispense Refill  . acetaminophen (TYLENOL) 325 MG tablet Take 2 tablets (650 mg total) by mouth every 6 (six) hours. 100 tablet 0  . aspirin EC 81 MG tablet Take 81 mg by mouth daily.    Marland Kitchen atorvastatin (LIPITOR) 40 MG tablet Take 1 tablet (40 mg total) by  mouth daily. 90 tablet 3  . clopidogrel (PLAVIX) 75 MG tablet Take 1 tablet (75 mg total) by mouth daily. 90 tablet 0  . ferrous sulfate 325 (65 FE) MG tablet Take 325 mg by mouth daily with breakfast.    . lisinopril (PRINIVIL,ZESTRIL) 30 MG tablet Take 1 tablet (30 mg total) by mouth daily. 90 tablet 1  . metFORMIN (GLUCOPHAGE-XR) 500 MG 24 hr tablet Take 1 tablet (500 mg total) by mouth daily with breakfast. TAKE 1 TABLET BY MOUTH WITH EVENING MEAL M-W-F (Patient taking differently: Take 500 mg by mouth every Monday, Wednesday, and Friday. TAKE 1 TABLET BY MOUTH WITH EVENING MEAL M-W-F) 45 tablet 2  . methocarbamol (ROBAXIN) 500 MG tablet Take 1 tablet (500 mg total) by mouth every 8 (eight) hours as needed for muscle spasms. 30 tablet 0  . metoprolol tartrate (LOPRESSOR) 25 MG tablet Take 0.5 tablets (12.5 mg total) by mouth 2 (two) times daily. 90 tablet 1  . nitroGLYCERIN (NITROSTAT) 0.4 MG SL tablet Place 1 tablet (0.4 mg total) under the tongue every 5 (five) minutes as needed for chest pain. 10 tablet 0  . ondansetron (ZOFRAN ODT) 4 MG disintegrating tablet Take 1 tablet (4 mg total) by mouth every 8 (eight) hours as needed for nausea or vomiting. 20 tablet 0  . oxyCODONE (OXY IR/ROXICODONE) 5 MG immediate release tablet Take 1-2 tablets (5-10 mg total) by mouth every 4 (four) hours as needed for severe pain (pain score 7-10). 60 tablet 0  . predniSONE (DELTASONE) 10 MG tablet Take 1 tablet (10 mg total) by mouth daily with breakfast. 30 tablet 1   No current facility-administered medications for this visit.     Allergies as of 07/31/2018  . (No Known Allergies)    Family History  Problem Relation Age of Onset  . Kidney disease Sister   . Colon cancer Sister 44  . Colon cancer Brother   . Bone cancer Brother   . Arthritis Mother   . Breast cancer Mother   . Heart disease Mother   . Early death Father   . Arthritis Son   . Esophageal cancer Sister   . Bladder Cancer Sister   .  Rectal cancer Neg Hx   . Stomach cancer Neg Hx     Social History   Socioeconomic History  . Marital status: Married    Spouse name: Not on file  . Number of children: 2  . Years of education: Not on file  . 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 Financial resource strain: Not on file  . Food insecurity:    Worry: Not on file    Inability: Not on file  . Transportation needs:    Medical: Not on file    Non-medical: Not on file  Tobacco Use  . Smoking status: Former Smoker    Packs/day: 1.00  Years: 20.00    Pack years: 20.00    Last attempt to quit: 08/14/1988    Years since quitting: 29.9  . Smokeless tobacco: Never Used  Substance and Sexual Activity  . Alcohol use: No  . Drug use: No  . Sexual activity: Yes    Birth control/protection: None  Lifestyle  . Physical activity:    Days per week: Not on file    Minutes per session: Not on file  . Stress: Not on file  Relationships  . Social connections:    Talks on phone: Not on file    Gets together: Not on file    Attends religious service: Not on file    Active member of club or organization: Not on file    Attends meetings of clubs or organizations: Not on file    Relationship status: Not on file  . Intimate partner violence:    Fear of current or ex partner: Not on file    Emotionally abused: Not on file    Physically abused: Not on file    Forced sexual activity: Not on file  Other Topics Concern  . Not on file  Social History Narrative   Married to Kep'el.    HS education. Retired.    Former smoker. No drugs or ETOH.   Wears seatbelt.   Wears dentures.   Smoke detector in the home. Firearms  In the home.    Feels safe in his relationships.     Review of Systems:    Constitutional: No weight loss, fever or chills Cardiovascular: No chest pain Respiratory: No SOB  Gastrointestinal: See HPI and  otherwise negative   Physical Exam:  Vital signs: BP 140/70 (BP Location: Left Arm, Patient Position: Sitting, Cuff Size: Normal)   Pulse 72   Ht 5' 8.5" (1.74 m) Comment: height measured without shoes  Wt 209 lb 8 oz (95 kg)   BMI 31.39 kg/m   Constitutional:   Pleasant Elderly Caucasian male appears to be in NAD, Well developed, Well nourished, alert and cooperative Respiratory: Respirations even and unlabored. Lungs clear to auscultation bilaterally.   No wheezes, crackles, or rhonchi.  Cardiovascular: Normal S1, S2. No MRG. Regular rate and rhythm. No peripheral edema, cyanosis or pallor.  Gastrointestinal:  Soft, nondistended, nontender. No rebound or guarding. Normal bowel sounds. No appreciable masses or hepatomegaly. Rectal:  Not performed.  Msk:  Symmetrical without gross deformities. Without edema, no deformity or joint abnormality. Ambulates with cane Psychiatric: Demonstrates good judgement and reason without abnormal affect or behaviors.  RELEVANT LABS AND IMAGING: CBC    Component Value Date/Time   WBC 17.5 (H) 07/25/2018 0846   RBC 4.16 (L) 07/25/2018 0846   HGB 11.4 (L) 07/25/2018 0846   HGB 13.2 07/02/2017 1036   HCT 36.7 (L) 07/25/2018 0846   HCT 40.4 07/02/2017 1036   PLT 302 07/25/2018 0846   PLT 170 07/02/2017 1036   MCV 88.2 07/25/2018 0846   MCV 85.3 07/02/2017 1036   MCH 27.4 07/25/2018 0846   MCHC 31.1 07/25/2018 0846   RDW 15.4 07/25/2018 0846   RDW 16.3 (H) 07/02/2017 1036   LYMPHSABS 12.9 (H) 07/25/2018 0846   LYMPHSABS 9.4 (H) 07/02/2017 1036   MONOABS 0.8 07/25/2018 0846   MONOABS 0.4 07/02/2017 1036   EOSABS 0.1 07/25/2018 0846   EOSABS 0.1 07/02/2017 1036   BASOSABS 0.1 07/25/2018 0846   BASOSABS 0.0 07/02/2017 1036    CMP     Component Value Date/Time  NA 143 07/25/2018 0846   NA 144 05/31/2015 1404   K 3.8 07/25/2018 0846   K 4.7 05/31/2015 1404   CL 104 07/25/2018 0846   CL 102 10/07/2012 1013   CO2 29 07/25/2018 0846   CO2  29 05/31/2015 1404   GLUCOSE 131 (H) 07/25/2018 0846   GLUCOSE 99 05/31/2015 1404   GLUCOSE 199 (H) 10/07/2012 1013   BUN 13 07/25/2018 0846   BUN 18.3 05/31/2015 1404   CREATININE 0.81 07/25/2018 0846   CREATININE 0.86 03/13/2017 0759   CREATININE 0.9 05/31/2015 1404   CALCIUM 9.2 07/25/2018 0846   CALCIUM 9.2 05/31/2015 1404   PROT 6.5 07/25/2018 0846   PROT 6.3 (L) 05/31/2015 1404   ALBUMIN 3.6 07/25/2018 0846   ALBUMIN 3.9 05/31/2015 1404   AST 30 07/25/2018 0846   AST 28 05/31/2015 1404   ALT 18 07/25/2018 0846   ALT 23 05/31/2015 1404   ALKPHOS 86 07/25/2018 0846   ALKPHOS 79 05/31/2015 1404   BILITOT 0.6 07/25/2018 0846   BILITOT 0.46 05/31/2015 1404   GFRNONAA >60 07/25/2018 0846   GFRNONAA 85 03/13/2017 0759   GFRAA >60 07/25/2018 0846   GFRAA >89 03/13/2017 0759    Assessment: 1.  Abnormal CT of the rectum: Showing possible proctitis, interestingly this was done 2 days after patient severe episode of constipation which required multiple laxatives to relieve, recent colonoscopy a year ago with multiple polyps but completely normal rectum; consider reactive proctitis versus other 2.  Constipation: Chronic for the patient, worse with pain meds after recent knee surgery  Plan: 1.  Recommend patient start taking his MiraLAX once daily 2.  Continue Metamucil daily and increased water intake 3.  Discussed case with Dr. Havery Taylor who would like to proceed with a flex sigmoidoscopy to ensure that there is no abnormality in the patient's rectum. 4.  Patient was scheduled for an unsedated flex sigmoidoscopy, per his request.  He was advised that he did not have to hold his anticoagulation for this procedure per Dr. Havery Taylor.  Did discuss risks, benefits, limitations and alternatives and the patient agrees to proceed. 5.  Patient to follow in clinic per recommendations from Dr. Havery Taylor after time of procedure.  Ellouise Newer, PA-C [facility] 07/31/2018,  1:37 PM  Cc: Howard Pouch A, DO

## 2018-07-31 NOTE — Patient Instructions (Signed)
Take Miralax, 1 dose 17 grams in 8 oz of water daily. Continue Metamucil .   You have been scheduled for a flexible sigmoidoscopy. Please follow the written instructions given to you at your visit today. If you use inhalers (even only as needed), please bring them with you on the day of your procedure. Normal BMI (Body Mass Index- based on height and weight) is between 23 and 30. Your BMI today is Body mass index is 31.39 kg/m. Marland Kitchen Please consider follow up  regarding your BMI with your Primary Care Provider.

## 2018-08-01 DIAGNOSIS — M25561 Pain in right knee: Secondary | ICD-10-CM | POA: Diagnosis not present

## 2018-08-01 DIAGNOSIS — G8929 Other chronic pain: Secondary | ICD-10-CM | POA: Diagnosis not present

## 2018-08-01 NOTE — Progress Notes (Signed)
Agree with assessment and plan as outlined. With change in bowel habits and CT findings, will pursue flex sig to ensure okay, although given relatively recent colonoscopy, hopefully changes are just reactive in nature.

## 2018-08-02 ENCOUNTER — Encounter (INDEPENDENT_AMBULATORY_CARE_PROVIDER_SITE_OTHER): Payer: Self-pay | Admitting: Orthopaedic Surgery

## 2018-08-02 ENCOUNTER — Ambulatory Visit (INDEPENDENT_AMBULATORY_CARE_PROVIDER_SITE_OTHER): Payer: Medicare Other | Admitting: Orthopaedic Surgery

## 2018-08-02 VITALS — BP 146/80 | HR 69 | Ht 68.5 in | Wt 209.0 lb

## 2018-08-02 DIAGNOSIS — Z96651 Presence of right artificial knee joint: Secondary | ICD-10-CM

## 2018-08-02 MED ORDER — DICLOFENAC SODIUM 1 % TD GEL: 2.0000 g | Freq: Four times a day (QID) | TRANSDERMAL | 1 refills | Status: DC

## 2018-08-02 NOTE — Progress Notes (Signed)
Office Visit Note   Patient: Marcus Taylor           Date of Birth: 1940-11-28           MRN: [id number] Visit Date: 08/02/2018              Requested by: [name], DO [id number] [place], [place] [address] PCP: [name], DO   Assessment & Plan: Visit Diagnoses:  1. History of total right knee replacement     Plan: Proximately 3 weeks status post primary right total knee replacement doing well.  Uses a cane for ambulation.  No fever or chills.  Needs to work on continued range of motion and strengthening.  Doing well.  Office 1 month  Follow-Up Instructions: Return in about 1 month (around 09/02/2018).   Orders:  No orders of the defined types were placed in this encounter.  Meds ordered this encounter  Medications  . diclofenac sodium (VOLTAREN) 1 % GEL    Sig: Apply 2 g topically 4 (four) times daily.    Dispense:  6 Tube    Refill:  1      Procedures: No procedures performed   Clinical Data: No additional findings.   Subjective: Chief Complaint  Patient presents with  . Right Knee - Routine Post Op  Has finished course of physical therapy and working on home exercises.  No related fever chills shortness of breath or chest pain.  Uses a cane for ambulation.  Discomfort the end of the day probably related to weakness.  HPI  Review of Systems   Objective: Vital Signs: BP (!) 146/80   Pulse 69   Ht 5' 8.5" (1.74 m)   Wt 209 lb (94.8 kg)   BMI 31.32 kg/m   Physical Exam  Ortho Exam total knee incision right knee healing without problem.  Full extension and about 100 degrees of flexion.  No instability.  No calf pain.  No popliteal discomfort.  Neurovascular exam intact.  Specialty Comments:  No specialty comments available.  Imaging: No results found.   PMFS History: Patient Active Problem List   Diagnosis Date Noted  . Liver cirrhosis ([place]) 07/25/2018  . History of total right knee replacement 07/09/2018  . Overweight (BMI  25.0-29.9) 05/22/2018  . Pancytopenia, acquired ([place]) 04/19/2018  . Easy bruising 04/19/2018  . Primary osteoarthritis of right knee 04/19/2018  . Decreased hearing of both ears 03/20/2018  . Splenomegaly 03/12/2018  . Enlarged prostate without lower urinary tract symptoms (luts) 03/12/2018  . Subarachnoid hematoma ([place]) 03/12/2018  . Neck pain 02/19/2018  . IDA (iron deficiency anemia) 08/09/2016  . Thrombocytopenia ([place]) 05/30/2016  . Diabetes mellitus type 2, controlled, with complications ([place]) 25/95/6387  . Right inguinal hernia 02/03/2016  . History of colon polyps 01/27/2016  . History of ST elevation myocardial infarction (STEMI) 01/27/2016  . CAD (coronary artery disease) 01/27/2016  . Hyperlipidemia 01/27/2016  . Anemia in neoplastic disease 05/25/2014  . Splenic marginal zone b-cell lymphoma ([place]) 12/18/2011  . HTN (hypertension)    Past Medical History:  Diagnosis Date  . Anemia   . Arthritis   . Colonic polyp   . Concussion 03/05/2018  . Coronary artery disease    stents x 3 (03-2014)  . Diabetes mellitus   . Diverticulosis    throughout entire colon  . HTN (hypertension)   . Hypertriglyceridemia   . Kidney stone   . Lymphocytosis   . Metatarsal fracture left  left 5th proximal phalanx   . OSA (obstructive sleep apnea)    "Mild" by records  . PONV (postoperative nausea and vomiting)    first time, no problem since then  . RBBB 01/2016  . Splenic marginal zone b-cell lymphoma ([place]) 12/18/2011  . Splenomegaly   . STEMI (ST elevation myocardial infarction) [facility])    August 2015  . Subarachnoid hemorrhage ([place]) 03/05/2018    Family History  Problem Relation Age of Onset  . Kidney disease Sister   . Colon cancer Sister 48  . Colon cancer Brother   . Bone cancer Brother   . Arthritis Mother   . Breast cancer Mother   . Heart disease Mother   . Early death Father   . Arthritis Son   . Esophageal cancer Sister   . Bladder Cancer Sister   . Rectal cancer  Neg Hx   . Stomach cancer Neg Hx     Past Surgical History:  Procedure Laterality Date  . CARDIAC CATHETERIZATION     stents x 3 (03/2014)  . CHOLECYSTECTOMY    . HERNIA REPAIR    . INGUINAL HERNIA REPAIR Right 05/27/2015   Procedure: LAPAROSCOPIC REPAIR RIGHT  INGUINAL HERNIA;  Surgeon: Greer Pickerel, MD;  [facility];  Service: General;  Laterality: Right;  . INSERTION OF MESH Right 05/27/2015   Procedure: INSERTION OF MESH;  Surgeon: Greer Pickerel, MD;  [facility];  Service: General;  Laterality: Right;  . JOINT REPLACEMENT    . LUMBAR DISC SURGERY    . TONSILLECTOMY AND ADENOIDECTOMY    . TOTAL KNEE ARTHROPLASTY Right 07/09/2018  . 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 Smoking status: Former Smoker    Packs/day: 1.00    Years: 20.00    Pack years: 20.00    Last attempt to quit: 08/14/1988    Years since quitting: 29.9  . Smokeless tobacco: Never Used  Substance and Sexual Activity  . Alcohol use: No  . Drug use: No  . Sexual activity: Yes    Birth control/protection: None     Garald Balding, MD   Note - This record has been created using [employer].  Chart creation errors have been sought, but may not always  have been located. Such creation errors do not reflect on  the standard of medical care.

## 2018-08-03 DIAGNOSIS — G8929 Other chronic pain: Secondary | ICD-10-CM | POA: Diagnosis not present

## 2018-08-03 DIAGNOSIS — M25561 Pain in right knee: Secondary | ICD-10-CM | POA: Diagnosis not present

## 2018-08-05 ENCOUNTER — Encounter: Payer: Self-pay | Admitting: Gastroenterology

## 2018-08-05 DIAGNOSIS — G8929 Other chronic pain: Secondary | ICD-10-CM | POA: Diagnosis not present

## 2018-08-05 DIAGNOSIS — M25561 Pain in right knee: Secondary | ICD-10-CM | POA: Diagnosis not present

## 2018-08-08 ENCOUNTER — Telehealth: Payer: Self-pay | Admitting: *Deleted

## 2018-08-08 NOTE — Telephone Encounter (Signed)
Patient reports he is taking Prednisone 1/2 tablet (5mg ) Monday Wednesday and Friday and 1 tablet (10mg ) the other days. He reports no change since this decrease. He is struggling with knee pain at night being followed by another dr. Patient verbalized understanding next week he will start 1/2 tablet daily (5mg ) as part of the taper. He has the directions on his refrigerator to remind him.

## 2018-08-09 DIAGNOSIS — M25561 Pain in right knee: Secondary | ICD-10-CM | POA: Diagnosis not present

## 2018-08-09 DIAGNOSIS — G8929 Other chronic pain: Secondary | ICD-10-CM | POA: Diagnosis not present

## 2018-08-12 ENCOUNTER — Ambulatory Visit (AMBULATORY_SURGERY_CENTER): Payer: Medicare Other | Admitting: Gastroenterology

## 2018-08-12 ENCOUNTER — Encounter: Payer: Self-pay | Admitting: Gastroenterology

## 2018-08-12 VITALS — BP 143/64 | HR 62 | Temp 97.3°F | Resp 16 | Ht 68.5 in | Wt 209.0 lb

## 2018-08-12 DIAGNOSIS — R935 Abnormal findings on diagnostic imaging of other abdominal regions, including retroperitoneum: Secondary | ICD-10-CM | POA: Diagnosis not present

## 2018-08-12 DIAGNOSIS — K5900 Constipation, unspecified: Secondary | ICD-10-CM | POA: Diagnosis not present

## 2018-08-12 DIAGNOSIS — K5730 Diverticulosis of large intestine without perforation or abscess without bleeding: Secondary | ICD-10-CM

## 2018-08-12 DIAGNOSIS — R933 Abnormal findings on diagnostic imaging of other parts of digestive tract: Secondary | ICD-10-CM | POA: Diagnosis not present

## 2018-08-12 MED ORDER — FLEET ENEMA 7-19 GM/118ML RE ENEM
1.0000 | ENEMA | Freq: Once | RECTAL | Status: AC
  Administered 2018-08-12: 1 via RECTAL

## 2018-08-12 MED ORDER — SODIUM CHLORIDE 0.9 % IV SOLN: 500.0000 mL | Freq: Once | INTRAVENOUS | Status: DC

## 2018-08-12 NOTE — Progress Notes (Signed)
Patient admitted to recovery following flex sigmoidoscopy. No sedation given. Elizabeth Palau CRNA dcing IV and assisting the patient to sit on the side  of the bed. Wife assisting to help dress. No cahnges in any medications including Plavix. . Patient requesting to walk out with assistance of wife.

## 2018-08-12 NOTE — Progress Notes (Signed)
Enema performed per pt.  Results were mostly ltbrown liquid with some 1/4-1/2 inch slender particles.

## 2018-08-12 NOTE — Op Note (Signed)
[facility] Patient Name: Marcus Taylor Procedure Date: 08/12/2018 12:16 PM MRN: [id number] Endoscopist: Remo Lipps P. Havery Moros , MD Age: 77 Referring MD:  Date of Birth: [birth year] Gender: Male Account #: [id number] Procedure:                Flexible Sigmoidoscopy Indications:              Abnormal CT of the GI tract (rectal thickening),                            Constipation in the setting of narcotics (knee                            replacement) and iron use Medicines:                None Procedure:                Pre-Anesthesia Assessment:                           - Prior to the procedure, a History and Physical                            was performed, and patient medications and                            allergies were reviewed. The patient's tolerance of                            previous anesthesia was also reviewed. The risks                            and benefits of the procedure and the sedation                            options and risks were discussed with the patient.                            All questions were answered, and informed consent                            was obtained. Prior Anticoagulants: The patient has                            taken Plavix (clopidogrel), last dose was day of                            procedure. ASA Grade Assessment: III - A patient                            with severe systemic disease. After reviewing the                            risks and benefits, the patient was deemed in  satisfactory condition to undergo the procedure.                           After obtaining informed consent, the scope was                            passed under direct vision. The Model PCF-H190DL                            [id number]) scope was introduced through the anus                            and advanced to the the sigmoid colon. The flexible                            sigmoidoscopy was accomplished  without difficulty.                            The patient tolerated the procedure well. The                            quality of the bowel preparation was good. Scope In: 12:23:29 PM Scope Out: 12:27:15 PM Total Procedure Duration: 0 hours 3 minutes 46 seconds  Findings:                 The perianal and digital rectal examinations were                            normal.                           Multiple medium-mouthed diverticula were found in                            the sigmoid colon.                           The exam was otherwise without abnormality. No                            abnormalities to account for CT findings. Complications:            No immediate complications. Estimated blood loss:                            None. Estimated Blood Loss:     Estimated blood loss: none. Impression:               - Diverticulosis in the sigmoid colon.                           - The examination was otherwise normal.                           - No abnormalities to account for CT changes, which  could have been secondary to severe constipation in                            the past. Recommendation:           - Discharge patient to home.                           - Resume previous diet.                           - Continue present medications.                           - Hopefully bowel function improves now that                            narcotics / iron have been stopped. Continue                            Miralax once to twice daily, titrate as needed.                            Call if no improvement Marcus Davison P. Talyn Dessert, MD 08/12/2018 12:34:07 PM This report has been signed electronically.

## 2018-08-12 NOTE — Patient Instructions (Signed)
Continue your Miralax once to twice daily. Titrate as needed.  YOU HAD AN ENDOSCOPIC PROCEDURE TODAY AT [facility]:   Refer to the procedure report that was given to you for any specific questions about what was found during the examination.  If the procedure report does not answer your questions, please call your gastroenterologist to clarify.  If you requested that your care partner not be given the details of your procedure findings, then the procedure report has been included in a sealed envelope for you to review at your convenience later.  YOU SHOULD EXPECT: Some feelings of bloating in the abdomen. Passage of more gas than usual.  Walking can help get rid of the air that was put into your GI tract during the procedure and reduce the bloating. If you had a lower endoscopy (such as a colonoscopy or flexible sigmoidoscopy) you may notice spotting of blood in your stool or on the toilet paper. If you underwent a bowel prep for your procedure, you may not have a normal bowel movement for a few days.  Please Note:  You might notice some irritation and congestion in your nose or some drainage.  This is from the oxygen used during your procedure.  There is no need for concern and it should clear up in a day or so.  SYMPTOMS TO REPORT IMMEDIATELY:   Following lower endoscopy (colonoscopy or flexible sigmoidoscopy):  Excessive amounts of blood in the stool  Significant tenderness or worsening of abdominal pains  Swelling of the abdomen that is new, acute  Fever of 100F or higher   For urgent or emergent issues, a gastroenterologist can be reached at any hour by calling [phone].   DIET:  We do recommend a small meal at first, but then you may proceed to your regular diet.  Drink plenty of fluids but you should avoid alcoholic beverages for 24 hours.  ACTIVITY:  You should plan to take it easy for the rest of today and you should NOT DRIVE or use heavy machinery until  tomorrow (because of the sedation medicines used during the test).    FOLLOW UP: Our staff will call the number listed on your records the next business day following your procedure to check on you and address any questions or concerns that you may have regarding the information given to you following your procedure. If we do not reach you, we will leave a message.  However, if you are feeling well and you are not experiencing any problems, there is no need to return our call.  We will assume that you have returned to your regular daily activities without incident.  If any biopsies were taken you will be contacted by phone or by letter within the next 1-3 weeks.  Please call us at [phone] if you have not heard about the biopsies in 3 weeks.    SIGNATURES/CONFIDENTIALITY: You and/or your care partner have signed paperwork which will be entered into your electronic medical record.  These signatures attest to the fact that that the information above on your After Visit Summary has been reviewed and is understood.  Full responsibility of the confidentiality of this discharge information lies with you and/or your care-partner.

## 2018-08-12 NOTE — Progress Notes (Signed)
Report given to PACU, vss 

## 2018-08-13 ENCOUNTER — Telehealth: Payer: Self-pay | Admitting: *Deleted

## 2018-08-13 DIAGNOSIS — G8929 Other chronic pain: Secondary | ICD-10-CM | POA: Diagnosis not present

## 2018-08-13 DIAGNOSIS — M25561 Pain in right knee: Secondary | ICD-10-CM | POA: Diagnosis not present

## 2018-08-13 NOTE — Telephone Encounter (Signed)
  Follow up Call-  Call back number 08/12/2018 04/18/2017  Post procedure Call Back phone  # [phone]  Permission to leave phone message Yes Yes  Some recent data might be hidden     Patient questions:  Do you have a fever, pain , or abdominal swelling? No. Pain Score  0 *  Have you tolerated food without any problems? Yes.    Have you been able to return to your normal activities? Yes.    Do you have any questions about your discharge instructions: Diet   No. Medications  No. Follow up visit  No.  Do you have questions or concerns about your Care? No.  Actions: * If pain score is 4 or above: No action needed, pain <4.

## 2018-08-15 DIAGNOSIS — G8929 Other chronic pain: Secondary | ICD-10-CM | POA: Diagnosis not present

## 2018-08-15 DIAGNOSIS — M25561 Pain in right knee: Secondary | ICD-10-CM | POA: Diagnosis not present

## 2018-08-17 DIAGNOSIS — M25561 Pain in right knee: Secondary | ICD-10-CM | POA: Diagnosis not present

## 2018-08-17 DIAGNOSIS — G8929 Other chronic pain: Secondary | ICD-10-CM | POA: Diagnosis not present

## 2018-08-20 DIAGNOSIS — M25561 Pain in right knee: Secondary | ICD-10-CM | POA: Diagnosis not present

## 2018-08-20 DIAGNOSIS — G8929 Other chronic pain: Secondary | ICD-10-CM | POA: Diagnosis not present

## 2018-08-21 ENCOUNTER — Telehealth: Payer: Self-pay

## 2018-08-21 ENCOUNTER — Other Ambulatory Visit: Payer: Self-pay

## 2018-08-21 MED ORDER — PREDNISONE 5 MG PO TABS: 5.0000 mg | ORAL_TABLET | Freq: Every day | ORAL | 1 refills | Status: DC

## 2018-08-21 NOTE — Telephone Encounter (Signed)
Called and given below message. He verbalized understanding. He is doing ok with no complaints. Tolerating the 5 mg Prednisone. Rx sent to pharmacy for Prednisone 5 mg. He will take 1/2 tablet for 2.5 mg dose.

## 2018-08-21 NOTE — Telephone Encounter (Signed)
-----   Message from [name], MD sent at 08/21/2018  8:28 AM EST ----- Regarding: prednisone taper Can you call and ask how is he doing? He should be on only 5 mg prednisone now If he tolerating it? If yes, we can do 2.5 mg alternate with 5 mg every other day Make sure he has the lower dose pill. We can call in If not tolerating, stay on 5 mg for a little longer

## 2018-08-22 DIAGNOSIS — M25561 Pain in right knee: Secondary | ICD-10-CM | POA: Diagnosis not present

## 2018-08-22 DIAGNOSIS — G8929 Other chronic pain: Secondary | ICD-10-CM | POA: Diagnosis not present

## 2018-08-26 DIAGNOSIS — M25561 Pain in right knee: Secondary | ICD-10-CM | POA: Diagnosis not present

## 2018-08-26 DIAGNOSIS — G8929 Other chronic pain: Secondary | ICD-10-CM | POA: Diagnosis not present

## 2018-08-29 DIAGNOSIS — M25561 Pain in right knee: Secondary | ICD-10-CM | POA: Diagnosis not present

## 2018-08-29 DIAGNOSIS — G8929 Other chronic pain: Secondary | ICD-10-CM | POA: Diagnosis not present

## 2018-09-02 ENCOUNTER — Ambulatory Visit (INDEPENDENT_AMBULATORY_CARE_PROVIDER_SITE_OTHER): Payer: Medicare Other | Admitting: Orthopaedic Surgery

## 2018-09-02 ENCOUNTER — Encounter (INDEPENDENT_AMBULATORY_CARE_PROVIDER_SITE_OTHER): Payer: Self-pay | Admitting: Orthopaedic Surgery

## 2018-09-02 VITALS — BP 137/80 | HR 54

## 2018-09-02 DIAGNOSIS — Z96651 Presence of right artificial knee joint: Secondary | ICD-10-CM

## 2018-09-02 NOTE — Progress Notes (Signed)
Office Visit Note   Patient: Marcus Taylor           Date of Birth: 09/22/1940           MRN: [id number] Visit Date: 09/02/2018              Requested by: [name], DO [address] [place], [place] [id number] PCP: [name], DO   Assessment & Plan: Visit Diagnoses:  1. History of total right knee replacement     Plan: Nearly 2 months status post primary right total knee replacement and doing quite well.  He notes that he "turned the corner in 6 weeks".  Longer using a crutch or cane.  Very happy with the results and working in therapy.  [facility] and check him in 3 months  Follow-Up Instructions: Return in about 3 months (around 12/02/2018).   Orders:  No orders of the defined types were placed in this encounter.  No orders of the defined types were placed in this encounter.     Procedures: No procedures performed   Clinical Data: No additional findings.   Subjective: Chief Complaint  Patient presents with  . Right Knee - Routine Post Op, Pain, Edema  . Follow-up    Total knee replacement 07/07/18  Related fever chills.  Feeling much better over the last several weeks.  Continues to go to physical therapy for strengthening and range of motion  HPI  Review of Systems   Objective: Vital Signs: BP 137/80   Pulse (!) 54   Physical Exam  Ortho Exam right knee with full quick extension and over 105 degrees of flexion.  No instability.  No effusion.  No increased heat.  Incision healing very nicely.  No calf pain or distal edema.  Specialty Comments:  No specialty comments available.  Imaging: No results found.   PMFS History: Patient Active Problem List   Diagnosis Date Noted  . Liver cirrhosis ([place]) 07/25/2018  . History of total right knee replacement 07/09/2018  . Overweight (BMI 25.0-29.9) 05/22/2018  . Pancytopenia, acquired ([place]) 04/19/2018  . Easy bruising 04/19/2018    . Primary osteoarthritis of right knee 04/19/2018  . Decreased hearing of both ears 03/20/2018  . Splenomegaly 03/12/2018  . Enlarged prostate without lower urinary tract symptoms (l[place]) 03/12/2018  . Subarachnoid hematoma ([place]) 03/12/2018  . Neck pain 02/19/2018  . IDA (iron deficiency anemia) 08/09/2016  . Thrombocytopenia ([place]) 05/30/2016  . Diabetes mellitus type 2, controlled, with complications ([place]) 23/76/2831  . Right inguinal hernia 02/03/2016  . History of colon polyps 01/27/2016  . History of ST elevation myocardial infarction (STEMI) 01/27/2016  . CAD (coronary artery disease) 01/27/2016  . Hyperlipidemia 01/27/2016  . Anemia in neoplastic disease 05/25/2014  . Splenic marginal zone b-cell lymphoma ([place]) 12/18/2011  . HTN (hypertension)    Past Medical History:  Diagnosis Date  . Anemia   . Arthritis   . Colonic polyp   . Concussion 03/05/2018  . Coronary artery disease    stents x 3 (03-2014)  . Diabetes mellitus   . Diverticulosis    throughout entire colon  . HTN (hypertension)   . Hypertriglyceridemia   . Kidney stone   . Lymphocytosis   . Metatarsal fracture left   left 5th proximal phalanx   . OSA (obstructive sleep apnea)    "Mild" by records  . PONV (postoperative nausea and vomiting)  first time, no problem since then  . RBBB 01/2016  . Splenic marginal zone b-cell lymphoma ([place]) 12/18/2011  . Splenomegaly   . STEMI (ST elevation myocardial infarction) [facility])    August 2015  . Subarachnoid hemorrhage ([place]) 03/05/2018    Family History  Problem Relation Age of Onset  . Kidney disease Sister   . Colon cancer Sister 69  . Colon cancer Brother   . Bone cancer Brother   . Arthritis Mother   . Breast cancer Mother   . Heart disease Mother   . Early death Father   . Arthritis Son   . Esophageal cancer Sister   . Bladder Cancer Sister   . Rectal cancer Neg Hx   . Stomach cancer Neg Hx     Past Surgical History:  Procedure Laterality Date  .  CARDIAC CATHETERIZATION     stents x 3 (03/2014)  . CHOLECYSTECTOMY    . HERNIA REPAIR    . INGUINAL HERNIA REPAIR Right 05/27/2015   Procedure: LAPAROSCOPIC REPAIR RIGHT  INGUINAL HERNIA;  Surgeon: Greer Pickerel, MD;  Location: [facility];  Service: General;  Laterality: Right;  . INSERTION OF MESH Right 05/27/2015   Procedure: INSERTION OF MESH;  Surgeon: Greer Pickerel, MD;  Location: [facility];  Service: General;  Laterality: Right;  . JOINT REPLACEMENT    . LUMBAR DISC SURGERY    . TONSILLECTOMY AND ADENOIDECTOMY    . TOTAL KNEE ARTHROPLASTY Right 07/09/2018  . TOTAL KNEE ARTHROPLASTY Right 07/09/2018   Procedure: RIGHT TOTAL KNEE ARTHROPLASTY;  Surgeon: Garald Balding, MD;  Location: [facility];  Service: Orthopedics;  Laterality: Right;   Social History   Occupational History    Comment: used to work as an Proofreader; now retired  Tobacco Use  . Smoking status: Former Smoker    Packs/day: 1.00    Years: 20.00    Pack years: 20.00    Last attempt to quit: 08/14/1988    Years since quitting: 30.0  . Smokeless tobacco: Never Used  Substance and Sexual Activity  . Alcohol use: No  . Drug use: No  . Sexual activity: Yes    Birth control/protection: None     Garald Balding, MD   Note - This record has been created using [employer].  Chart creation errors have been sought, but may not always  have been located. Such creation errors do not reflect on  the standard of medical care.

## 2018-09-05 ENCOUNTER — Telehealth: Payer: Self-pay | Admitting: Family Medicine

## 2018-09-05 NOTE — Telephone Encounter (Signed)
Copied from [place] [phone]. Topic: General - Other >> Sep 05, 2018  2:26 PM Lennox Solders wrote: Reason for CRM: nidhi soni NP [facility] is calling to report abnormal findings PAD left leg 0.88 moderate and right leg 1.06 that normal. Urine shown +2 protein

## 2018-09-06 NOTE — Telephone Encounter (Signed)
I received the message- although I am not certain why this was completed on him- it is not an order from this office.  I am assuming these are ABI test, which is a test for peripheral artery disease (PAD). - if his test was accurately completed then he could have mild peripheral artery disease.  - I am not familiar with or why "[facility]" completed this or who they report to, or if there is a follow up already for patient and they were just letting us know?   If he would like to discuss and consider proper evaluation please have him schedule and I would be happy to expalin it all.   FYI: His wife also has them- phone note in her chart also sent to you.

## 2018-09-09 ENCOUNTER — Telehealth: Payer: Self-pay

## 2018-09-09 NOTE — Telephone Encounter (Signed)
Spoke with NP who did the studies and they stated it is a 1x a year visit the insurance company. They have reviewed the results with the patient but just wanted you to have the results and make any recommendations for patient.

## 2018-09-09 NOTE — Telephone Encounter (Signed)
Already addressed with NP   Copied from [place] [phone]. Topic: General - Other >> Sep 09, 2018  3:18 PM Virl Axe D wrote: Reason for CRM: Starling Manns, Nurse Practitioner with [facility] wanted to let [place] know patient's finding were abnormal and to address at next [place].

## 2018-09-09 NOTE — Telephone Encounter (Signed)
Called NP who reported the studies that were done to see if this was just being reported or if the results needed to be gone over with the patient. VM was left on NP VM to return call.

## 2018-09-20 ENCOUNTER — Ambulatory Visit: Payer: Medicare Other | Admitting: Family Medicine

## 2018-09-22 ENCOUNTER — Other Ambulatory Visit: Payer: Self-pay | Admitting: Family Medicine

## 2018-09-23 ENCOUNTER — Encounter: Payer: Self-pay | Admitting: Podiatry

## 2018-09-23 ENCOUNTER — Ambulatory Visit: Payer: Medicare Other | Admitting: Podiatry

## 2018-09-23 DIAGNOSIS — B351 Tinea unguium: Secondary | ICD-10-CM

## 2018-09-23 DIAGNOSIS — D689 Coagulation defect, unspecified: Secondary | ICD-10-CM | POA: Diagnosis not present

## 2018-09-23 DIAGNOSIS — M79676 Pain in unspecified toe(s): Secondary | ICD-10-CM

## 2018-09-23 LAB — HM DIABETES FOOT EXAM

## 2018-09-23 NOTE — Progress Notes (Signed)
Subjective: 78 y.o. returns the office today for painful, elongated, thickened toenails which he cannot trim himself. Denies any redness or drainage around the nails. Denies any acute changes since last appointment and no new complaints today. Denies any systemic complaints such as fevers, chills, nausea, vomiting.   He is on Plavix.   Objective: AAO 3, NAD DP/PT pulses palpable, CRT less than 3 seconds Nails hypertrophic, dystrophic, elongated, brittle, discolored 10. There is tenderness overlying the nails 1-5 bilaterally. There is no surrounding erythema or drainage along the nail sites. The hallux and second digit nails are getting ingrown. No signs of infection.  Hammertoes are present.  No open lesions or pre-ulcerative lesions are identified. No pain with calf compression, swelling, warmth, erythema.  Assessment: 78 year old male presents with symptomatic onychomycosis  Plan: -Treatment options including alternatives, risks, complications were discussed -Nails sharply debrided 10 without complication/bleeding.  -Discussed daily foot inspection. If there are any changes, to call the office immediately.  -Follow-up in 9 weeks for nail trim or sooner if any problems are to arise. In the meantime, encouraged to call the office with any questions, concerns, changes symptoms.  Celesta Gentile, DPM

## 2018-09-26 ENCOUNTER — Inpatient Hospital Stay: Payer: Medicare Other | Attending: Hematology and Oncology | Admitting: Hematology and Oncology

## 2018-09-26 ENCOUNTER — Encounter: Payer: Self-pay | Admitting: Hematology and Oncology

## 2018-09-26 ENCOUNTER — Inpatient Hospital Stay: Payer: Medicare Other

## 2018-09-26 ENCOUNTER — Telehealth: Payer: Self-pay | Admitting: Hematology and Oncology

## 2018-09-26 ENCOUNTER — Other Ambulatory Visit: Payer: Self-pay | Admitting: Hematology and Oncology

## 2018-09-26 DIAGNOSIS — C8307 Small cell B-cell lymphoma, spleen: Secondary | ICD-10-CM | POA: Diagnosis not present

## 2018-09-26 DIAGNOSIS — D509 Iron deficiency anemia, unspecified: Secondary | ICD-10-CM | POA: Diagnosis not present

## 2018-09-26 DIAGNOSIS — K7460 Unspecified cirrhosis of liver: Secondary | ICD-10-CM

## 2018-09-26 DIAGNOSIS — Z79899 Other long term (current) drug therapy: Secondary | ICD-10-CM | POA: Insufficient documentation

## 2018-09-26 DIAGNOSIS — Z791 Long term (current) use of non-steroidal anti-inflammatories (NSAID): Secondary | ICD-10-CM | POA: Diagnosis not present

## 2018-09-26 DIAGNOSIS — E118 Type 2 diabetes mellitus with unspecified complications: Secondary | ICD-10-CM

## 2018-09-26 DIAGNOSIS — Z7984 Long term (current) use of oral hypoglycemic drugs: Secondary | ICD-10-CM | POA: Diagnosis not present

## 2018-09-26 DIAGNOSIS — Z7982 Long term (current) use of aspirin: Secondary | ICD-10-CM | POA: Insufficient documentation

## 2018-09-26 DIAGNOSIS — Z86010 Personal history of colonic polyps: Secondary | ICD-10-CM | POA: Diagnosis not present

## 2018-09-26 LAB — CBC WITH DIFFERENTIAL/PLATELET
Abs Immature Granulocytes: 0.01 10*3/uL (ref 0.00–0.07)
Basophils Absolute: 0.1 10*3/uL (ref 0.0–0.1)
Basophils Relative: 0 %
Eosinophils Absolute: 0.1 10*3/uL (ref 0.0–0.5)
Eosinophils Relative: 1 %
HCT: 37.9 % — ABNORMAL LOW (ref 39.0–52.0)
Hemoglobin: 11.6 g/dL — ABNORMAL LOW (ref 13.0–17.0)
Immature Granulocytes: 0 %
Lymphocytes Relative: 77 %
Lymphs Abs: 9.7 10*3/uL — ABNORMAL HIGH (ref 0.7–4.0)
MCH: 26.4 pg (ref 26.0–34.0)
MCHC: 30.6 g/dL (ref 30.0–36.0)
MCV: 86.1 fL (ref 80.0–100.0)
Monocytes Absolute: 0.6 10*3/uL (ref 0.1–1.0)
Monocytes Relative: 5 %
Neutro Abs: 2.1 10*3/uL (ref 1.7–7.7)
Neutrophils Relative %: 17 %
Platelets: 177 10*3/uL (ref 150–400)
RBC: 4.4 MIL/uL (ref 4.22–5.81)
RDW: 15.3 % (ref 11.5–15.5)
WBC: 12.6 10*3/uL — ABNORMAL HIGH (ref 4.0–10.5)
nRBC: 0 % (ref 0.0–0.2)

## 2018-09-26 LAB — COMPREHENSIVE METABOLIC PANEL
ALT: 16 U/L (ref 0–44)
AST: 32 U/L (ref 15–41)
Albumin: 4.1 g/dL (ref 3.5–5.0)
Alkaline Phosphatase: 70 U/L (ref 38–126)
Anion gap: 8 (ref 5–15)
BUN: 15 mg/dL (ref 8–23)
CO2: 27 mmol/L (ref 22–32)
Calcium: 9.1 mg/dL (ref 8.9–10.3)
Chloride: 106 mmol/L (ref 98–111)
Creatinine, Ser: 0.88 mg/dL (ref 0.61–1.24)
GFR calc Af Amer: >60 mL/min (ref >60)
Glucose, Bld: 121 mg/dL — ABNORMAL HIGH (ref 70–99)
Potassium: 4.1 mmol/L (ref 3.5–5.1)
Sodium: 141 mmol/L (ref 135–145)
Total Bilirubin: 0.4 mg/dL (ref 0.3–1.2)
Total Protein: 6.4 g/dL — ABNORMAL LOW (ref 6.5–8.1)

## 2018-09-26 MED ORDER — ACETAMINOPHEN 325 MG PO TABS: 650.0000 mg | ORAL_TABLET | Freq: Four times a day (QID) | ORAL | 0 refills | Status: AC | PRN

## 2018-09-26 NOTE — Assessment & Plan Note (Signed)
Overall, he tolerated prednisone taper well except for intermittent flare of his bone pain I recommend reducing his prednisone to 5 mg and eventually complete taper in a few weeks.

## 2018-09-26 NOTE — Progress Notes (Signed)
[facility] OFFICE PROGRESS NOTE  Patient Care Team: [name], DO as PCP - General (Family Medicine) [name], MD (Inactive) as Consulting Physician (Gastroenterology) [name], MD as Consulting Physician (Hematology and Oncology) [name], MD as Referring Physician (Cardiology) [name], MD as Consulting Physician (Neurosurgery) [name], OD as Referring Physician Garald Balding, MD as Consulting Physician (Orthopedic Surgery) Trula Slade, DPM as Consulting Physician (Podiatry)  ASSESSMENT & PLAN:  Splenic marginal zone b-cell lymphoma ([place]) Overall, he tolerated prednisone taper well except for intermittent flare of his bone pain I recommend reducing his prednisone to 5 mg and eventually complete taper in a few weeks.  History of colon polyps Recent sigmoidoscopy was within normal limits.  Diabetes mellitus type 2, controlled, with complications ([place]) The patient has progressive weight gain secondary to side effects of prednisone We discussed prednisone taper and dietary modification and weight loss strategy Suspect his chronic diabetes might have caused nonalcoholic steatosis and that itself caused chronic liver cirrhosis   No orders of the defined types were placed in this encounter.   INTERVAL HISTORY: Please see below for problem oriented charting. He returns with his family for further follow-up He feels well He was able to continue prednisone taper although he has occasional flare of arthritis He recently underwent sigmoidoscopy with no abnormalities found. His energy level is fair.  He continues to have mild weight gain while on prednisone He is currently taking 7.5 mg daily  SUMMARY OF ONCOLOGIC HISTORY:  This is a very pleasant gentleman who was found to have abnormal CBC for the past 5 years. In April 2013, flow cytometry detectable monoclonal B-cell population suspicious for splenic/marginal zone  lymphoma. Ultrasound confirmed splenomegaly. Due to lack of symptoms he was being observed. In October 2017, he was noted to have microcytic, iron deficiency anemia. He was started on oral iron supplement On 03/05/2018, the patient had an accidental fall.  CT imaging revealed significant splenomegaly On 03/14/2018, he was started on 20 mg of prednisone daily On April 18, 2018, the dose of prednisone was reduced to 10 mg daily In December 2019, CT imaging show chronic liver cirrhosis and splenomegaly without significant lymphadenopathy  REVIEW OF SYSTEMS:   Constitutional: Denies fevers, chills or abnormal weight loss Eyes: Denies blurriness of vision Ears, nose, mouth, throat, and face: Denies mucositis or sore throat Respiratory: Denies cough, dyspnea or wheezes Cardiovascular: Denies palpitation, chest discomfort or lower extremity swelling Gastrointestinal:  Denies nausea, heartburn or change in bowel habits Skin: Denies abnormal skin rashes Lymphatics: Denies new lymphadenopathy or easy bruising Neurological:Denies numbness, tingling or new weaknesses Behavioral/Psych: Mood is stable, no new changes  All other systems were reviewed with the patient and are negative.  I have reviewed the past medical history, past surgical history, social history and family history with the patient and they are unchanged from previous note.  ALLERGIES:  has No Known Allergies.  MEDICATIONS:  Current Outpatient Medications  Medication Sig Dispense Refill  . acetaminophen (TYLENOL) 325 MG tablet Take 2 tablets (650 mg total) by mouth every 6 (six) hours as needed. 100 tablet 0  . aspirin EC 81 MG tablet Take 81 mg by mouth daily.    Marland Kitchen atorvastatin (LIPITOR) 40 MG tablet Take 1 tablet (40 mg total) by mouth daily. 90 tablet 3  . clopidogrel (PLAVIX) 75 MG tablet Take 1 tablet (75 mg total) by mouth daily. 90 tablet 0  . diclofenac sodium (VOLTAREN) 1 %  GEL Apply 2 g topically 4 (four) times daily. 6  Tube 1  . lisinopril (PRINIVIL,ZESTRIL) 30 MG tablet Take 1 tablet (30 mg total) by mouth daily. 90 tablet 1  . metFORMIN (GLUCOPHAGE-XR) 500 MG 24 hr tablet Take 1 tablet (500 mg total) by mouth every Monday, Wednesday, and Friday. TAKE 1 TABLET BY MOUTH WITH EVENING MEAL M-W-F 45 tablet 1  . metoprolol tartrate (LOPRESSOR) 25 MG tablet Take 0.5 tablets (12.5 mg total) by mouth 2 (two) times daily. 90 tablet 1  . nitroGLYCERIN (NITROSTAT) 0.4 MG SL tablet Place 1 tablet (0.4 mg total) under the tongue every 5 (five) minutes as needed for chest pain. 10 tablet 0  . predniSONE (DELTASONE) 5 MG tablet Take 1 tablet (5 mg total) by mouth daily with breakfast. 30 tablet 1   Current Facility-Administered Medications  Medication Dose Route Frequency Provider Last Rate Last Dose  . 0.9 %  sodium chloride infusion  500 mL Intravenous Once Yetta Flock, MD        PHYSICAL EXAMINATION: ECOG PERFORMANCE STATUS: 1 - Symptomatic but completely ambulatory  Vitals:   09/26/18 0844  BP: (!) 155/85  Pulse: 63  Resp: 20  Temp: 97.8 F (36.6 C)  SpO2: 100%   Filed Weights   09/26/18 0844  Weight: 212 lb 11.2 oz (96.5 kg)    GENERAL:alert, no distress and comfortable Musculoskeletal:no cyanosis of digits and no clubbing  NEURO: alert & oriented x 3 with fluent speech, no focal motor/sensory deficits  LABORATORY DATA:  I have reviewed the data as listed    Component Value Date/Time   NA 141 09/26/2018 0827   NA 144 05/31/2015 1404   K 4.1 09/26/2018 0827   K 4.7 05/31/2015 1404   CL 106 09/26/2018 0827   CL 102 10/07/2012 1013   CO2 27 09/26/2018 0827   CO2 29 05/31/2015 1404   GLUCOSE 121 (H) 09/26/2018 0827   GLUCOSE 99 05/31/2015 1404   GLUCOSE 199 (H) 10/07/2012 1013   BUN 15 09/26/2018 0827   BUN 18.3 05/31/2015 1404   CREATININE 0.88 09/26/2018 0827   CREATININE 0.86 03/13/2017 0759   CREATININE 0.9 05/31/2015 1404   CALCIUM 9.1 09/26/2018 0827   CALCIUM 9.2 05/31/2015  1404   PROT 6.4 (L) 09/26/2018 0827   PROT 6.3 (L) 05/31/2015 1404   ALBUMIN 4.1 09/26/2018 0827   ALBUMIN 3.9 05/31/2015 1404   AST 32 09/26/2018 0827   AST 28 05/31/2015 1404   ALT 16 09/26/2018 0827   ALT 23 05/31/2015 1404   ALKPHOS 70 09/26/2018 0827   ALKPHOS 79 05/31/2015 1404   BILITOT 0.4 09/26/2018 0827   BILITOT 0.46 05/31/2015 1404   GFRNONAA >60 09/26/2018 0827   GFRNONAA 85 03/13/2017 0759   GFRAA >60 09/26/2018 0827   GFRAA >89 03/13/2017 0759    No results found for: SPEP, UPEP  Lab Results  Component Value Date   WBC 12.6 (H) 09/26/2018   NEUTROABS 2.1 09/26/2018   HGB 11.6 (L) 09/26/2018   HCT 37.9 (L) 09/26/2018   MCV 86.1 09/26/2018   PLT 177 09/26/2018      Chemistry      Component Value Date/Time   NA 141 09/26/2018 0827   NA 144 05/31/2015 1404   K 4.1 09/26/2018 0827   K 4.7 05/31/2015 1404   CL 106 09/26/2018 0827   CL 102 10/07/2012 1013   CO2 27 09/26/2018 0827   CO2 29 05/31/2015 1404   BUN 15  09/26/2018 0827   BUN 18.3 05/31/2015 1404   CREATININE 0.88 09/26/2018 0827   CREATININE 0.86 03/13/2017 0759   CREATININE 0.9 05/31/2015 1404      Component Value Date/Time   CALCIUM 9.1 09/26/2018 0827   CALCIUM 9.2 05/31/2015 1404   ALKPHOS 70 09/26/2018 0827   ALKPHOS 79 05/31/2015 1404   AST 32 09/26/2018 0827   AST 28 05/31/2015 1404   ALT 16 09/26/2018 0827   ALT 23 05/31/2015 1404   BILITOT 0.4 09/26/2018 0827   BILITOT 0.46 05/31/2015 1404       All questions were answered. The patient knows to call the clinic with any problems, questions or concerns. No barriers to learning was detected.  I spent 10 minutes counseling the patient face to face. The total time spent in the appointment was 15 minutes and more than 50% was on counseling and review of test results  [name], MD 09/26/2018 3:49 PM

## 2018-09-26 NOTE — Assessment & Plan Note (Signed)
Recent sigmoidoscopy was within normal limits.

## 2018-09-26 NOTE — Assessment & Plan Note (Signed)
The patient has progressive weight gain secondary to side effects of prednisone We discussed prednisone taper and dietary modification and weight loss strategy Suspect his chronic diabetes might have caused nonalcoholic steatosis and that itself caused chronic liver cirrhosis

## 2018-09-26 NOTE — Telephone Encounter (Signed)
Gave avs and calendar ° °

## 2018-09-27 ENCOUNTER — Ambulatory Visit (INDEPENDENT_AMBULATORY_CARE_PROVIDER_SITE_OTHER): Payer: Medicare Other | Admitting: Family Medicine

## 2018-09-27 ENCOUNTER — Encounter: Payer: Self-pay | Admitting: Family Medicine

## 2018-09-27 VITALS — BP 142/78 | HR 55 | Temp 97.9°F | Resp 16 | Ht 69.0 in | Wt 214.5 lb

## 2018-09-27 DIAGNOSIS — K7460 Unspecified cirrhosis of liver: Secondary | ICD-10-CM

## 2018-09-27 DIAGNOSIS — D500 Iron deficiency anemia secondary to blood loss (chronic): Secondary | ICD-10-CM

## 2018-09-27 DIAGNOSIS — S066X0A Traumatic subarachnoid hemorrhage without loss of consciousness, initial encounter: Secondary | ICD-10-CM

## 2018-09-27 DIAGNOSIS — I10 Essential (primary) hypertension: Secondary | ICD-10-CM

## 2018-09-27 DIAGNOSIS — C8307 Small cell B-cell lymphoma, spleen: Secondary | ICD-10-CM

## 2018-09-27 DIAGNOSIS — I252 Old myocardial infarction: Secondary | ICD-10-CM | POA: Diagnosis not present

## 2018-09-27 DIAGNOSIS — N429 Disorder of prostate, unspecified: Secondary | ICD-10-CM

## 2018-09-27 DIAGNOSIS — E785 Hyperlipidemia, unspecified: Secondary | ICD-10-CM

## 2018-09-27 DIAGNOSIS — R161 Splenomegaly, not elsewhere classified: Secondary | ICD-10-CM

## 2018-09-27 DIAGNOSIS — I2510 Atherosclerotic heart disease of native coronary artery without angina pectoris: Secondary | ICD-10-CM

## 2018-09-27 DIAGNOSIS — E118 Type 2 diabetes mellitus with unspecified complications: Secondary | ICD-10-CM

## 2018-09-27 DIAGNOSIS — E663 Overweight: Secondary | ICD-10-CM

## 2018-09-27 DIAGNOSIS — D696 Thrombocytopenia, unspecified: Secondary | ICD-10-CM

## 2018-09-27 LAB — POCT GLYCOSYLATED HEMOGLOBIN (HGB A1C)
HbA1c POC (<> result, manual entry): 5.7 % (ref 4.0–5.6)
HbA1c, POC (controlled diabetic range): 5.7 % (ref 0.0–7.0)
HbA1c, POC (prediabetic range): 5.7 % (ref 5.7–6.4)
Hemoglobin A1C: 5.7 % — AB (ref 4.0–5.6)

## 2018-09-27 MED ORDER — METOPROLOL TARTRATE 25 MG PO TABS: 12.5000 mg | ORAL_TABLET | Freq: Two times a day (BID) | ORAL | 1 refills | Status: DC

## 2018-09-27 MED ORDER — LISINOPRIL 30 MG PO TABS: 30.0000 mg | ORAL_TABLET | Freq: Every day | ORAL | 1 refills | Status: DC

## 2018-09-27 MED ORDER — METFORMIN HCL ER 500 MG PO TB24: 500.0000 mg | ORAL_TABLET | ORAL | 1 refills | Status: DC

## 2018-09-27 MED ORDER — CLOPIDOGREL BISULFATE 75 MG PO TABS: 75.0000 mg | ORAL_TABLET | Freq: Every day | ORAL | 0 refills | Status: DC

## 2018-09-27 NOTE — Patient Instructions (Addendum)
A1c is  5.7 today. Continue metformin 3x weekly.  Refilled you meds today.   Follow up in 4 months chronic conditions.  Watch your blood pressure over next couple weeks as you taper off the steroid. If still not < 135-- call in and we will increase the lisinopril dose.    Please help [place] help you:  We are honored you have chosen [facility] for your Primary Care home. Below you will find basic instructions that you may need to access in the future. Please help [place] help you by reading the instructions, which cover many of the frequent questions we experience.   Prescription refills and request:  -In order to allow more efficient response time, please call your pharmacy for all refills. They will forward the request electronically to [place]. This allows for the quickest possible response. Request left on a nurse line can take longer to refill, since these are checked as time allows between office patients and other phone calls.  - refill request can take up to 3-5 working days to complete.  - If request is sent electronically and request is appropiate, it is usually completed in 1-2 business days.  - all patients will need to be seen routinely for all chronic medical conditions requiring prescription medications (see follow-up below). If you are overdue for follow up on your condition, you will be asked to make an appointment and we will call in enough medication to cover you until your appointment (up to 30 days).  - all controlled substances will require a face to face visit to request/refill.  - if you desire your prescriptions to go through a new pharmacy, and have an active script at original pharmacy, you will need to call your pharmacy and have scripts transferred to new pharmacy. This is completed between the pharmacy locations and not by your provider.    Results: If any images or labs were ordered, it can take up to 1 week to get results depending on the test ordered and the lab/facility  running and resulting the test. - Normal or stable results, which do not need further discussion, may be released to your mychart immediately with attached note to you. A call may not be generated for normal results. Please make certain to sign up for mychart. If you have questions on how to activate your mychart you can call the front office.  - If your results need further discussion, our office will attempt to contact you via phone, and if unable to reach you after 2 attempts, we will release your abnormal result to your mychart with instructions.  - All results will be automatically released in mychart after 1 week.  - Your provider will provide you with explanation and instruction on all relevant material in your results. Please keep in mind, results and labs may appear confusing or abnormal to the untrained eye, but it does not mean they are actually abnormal for you personally. If you have any questions about your results that are not covered, or you desire more detailed explanation than what was provided, you should make an appointment with your provider to do so.   Our office handles many outgoing and incoming calls daily. If we have not contacted you within 1 week about your results, please check your mychart to see if there is a message first and if not, then contact our office.  In helping with this matter, you help decrease call volume, and therefore allow [place] to be able to  respond to patients needs more efficiently.   Acute office visits (sick visit):  An acute visit is intended for a new problem and are scheduled in shorter time slots to allow schedule openings for patients with new problems. This is the appropriate visit to discuss a new problem. Problems will not be addressed by phone call or Echart message. Appointment is needed if requesting treatment. In order to provide you with excellent quality medical care with proper time for you to explain your problem, have an exam and receive  treatment with instructions, these appointments should be limited to one new problem per visit. If you experience a new problem, in which you desire to be addressed, please make an acute office visit, we save openings on the schedule to accommodate you. Please do not save your new problem for any other type of visit, let us take care of it properly and quickly for you.   Follow up visits:  Depending on your condition(s) your provider will need to see you routinely in order to provide you with quality care and prescribe medication(s). Most chronic conditions (Example: hypertension, Diabetes, depression/anxiety... etc), require visits a couple times a year. Your provider will instruct you on proper follow up for your personal medical conditions and history. Please make certain to make follow up appointments for your condition as instructed. Failing to do so could result in lapse in your medication treatment/refills. If you request a refill, and are overdue to be seen on a condition, we will always provide you with a 30 day script (once) to allow you time to schedule.    Medicare wellness (well visit): - we have a wonderful Nurse Maudie Mercury), that will meet with you and provide you will yearly medicare wellness visits. These visits should occur yearly (can not be scheduled less than 1 calendar year apart) and cover preventive health, immunizations, advance directives and screenings you are entitled to yearly through your medicare benefits. Do not miss out on your entitled benefits, this is when medicare will pay for these benefits to be ordered for you.  These are strongly encouraged by your provider and is the appropriate type of visit to make certain you are up to date with all preventive health benefits. If you have not had your medicare wellness exam in the last 12 months, please make certain to schedule one by calling the office and schedule your medicare wellness with Maudie Mercury as soon as possible.   Yearly physical  (well visit):  - Adults are recommended to be seen yearly for physicals. Check with your insurance and date of your last physical, most insurances require one calendar year between physicals. Physicals include all preventive health topics, screenings, medical exam and labs that are appropriate for gender/age and history. You may have fasting labs needed at this visit. This is a well visit (not a sick visit), new problems should not be covered during this visit (see acute visit).  - Pediatric patients are seen more frequently when they are younger. Your provider will advise you on well child visit timing that is appropriate for your their age. - This is not a medicare wellness visit. Medicare wellness exams do not have an exam portion to the visit. Some medicare companies allow for a physical, some do not allow a yearly physical. If your medicare allows a yearly physical you can schedule the medicare wellness with our nurse Maudie Mercury and have your physical with your provider after, on the same day. Please check with insurance for your  full benefits.   Late Policy/No Shows:  - all new patients should arrive 15-30 minutes earlier than appointment to allow [place] time  to  obtain all personal demographics,  insurance information and for you to complete office paperwork. - All established patients should arrive 10-15 minutes earlier than appointment time to update all information and be checked in .  - In our best efforts to run on time, if you are late for your appointment you will be asked to either reschedule or if able, we will work you back into the schedule. There will be a wait time to work you back in the schedule,  depending on availability.  - If you are unable to make it to your appointment as scheduled, please call 24 hours ahead of time to allow [place] to fill the time slot with someone else who needs to be seen. If you do not cancel your appointment ahead of time, you may be charged a no show fee.

## 2018-09-27 NOTE — Progress Notes (Signed)
Patient ID: Marcus Taylor, male   DOB: 1940-09-22, 78 y.o.   MRN: [id number]      Patient ID: Marcus Taylor, male  DOB: 1941-03-29, 78 y.o.   MRN: [id number] Patient Care Team    Relationship Specialty Notifications Start End  [name], DO PCP - General Family Medicine  01/26/16   [name], MD (Inactive) Consulting Physician Gastroenterology  01/26/16   [name], MD Consulting Physician Hematology and Oncology  01/26/16   [name], MD Referring Physician Cardiology  01/26/16   [name], MD Consulting Physician Neurosurgery  01/27/16   [name], OD Referring Physician   05/03/16    Comment: opthalmology   Garald Balding, MD Consulting Physician Orthopedic Surgery  06/14/18   Trula Slade, DPM Consulting Physician Podiatry  06/14/18     Subjective:  Marcus Taylor is a 78 y.o. male present for follow up on chronic medical issues.  All past medical history, surgical history, allergies, family history, immunizations, medications and social history were updated in the electronic medical record today.  Hypertension/H/o MI/CAD/stentsx3/iron deficiency anemia: Pt routinely follows with cardiology at [facility], Dr. [name]. Pt has had stent placement and MI in 2015. He is prescribed Lipitor 40 mg, plavix, lisinopril 30 mg, lopressor 12.5 (BID). He takes a daily baby asa. He is prescribed nitro, he has not needed it.   Blood pressures ranges at home " are slowly rising".  Fortunately they are tapering off of his chronic prednisone for the next couple weeks.  Patient denies chest pain, shortness of breath, dizziness or lower extremity edema.  BMP: 09/26/2018 within normal limits with the exception of very mild lower protein at 6.4 CBC: 09/26/2018 for a mild elevation in white blood cell 12.6 which is chronic, hemoglobin 11.6, hematocrit 37.9 (followed by oncology) TSH: 03/05/2018 within normal limits Lipid: 01/17/2018 total cholesterol 96, HDL 26, LDL 45,  triglycerides 123 Diet: low sodium  Diabetes type 2: Patient reports compliance on metformin 500 mg Monday Wednesday and Friday. He is unable to exercise secondary to orthopedic condition. Patient denies dizziness, hyperglycemic or hypoglycemic events. Patient denies numbness, tingling in the extremities or nonhealing wounds of feet.  A1c: 6.2 --> 6.0>5.7 - Opthalmology: 04/15/2018 [facility] Dr. [name].  Records requested - pna series UTD/completed  - flu shot up-to-date 05/20/2018 - foot exam completed 09/23/2018 - urine micro on ACei   Past Medical History:  Diagnosis Date  . Anemia   . Arthritis   . Colonic polyp   . Concussion 03/05/2018  . Coronary artery disease    stents x 3 (03-2014)  . Diabetes mellitus   . Diverticulosis    throughout entire colon  . HTN (hypertension)   . Hypertriglyceridemia   . Kidney stone   . Lymphocytosis   . Metatarsal fracture left   left 5th proximal phalanx   . OSA (obstructive sleep apnea)    "Mild" by records  . PONV (postoperative nausea and vomiting)    first time, no problem since then  . RBBB 01/2016  . 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 Splenomegaly   . STEMI (ST elevation myocardial infarction) [facility])    August 2015  . Subarachnoid hemorrhage ([facility]) 03/05/2018   No Known Allergies Past Surgical History:  Procedure Laterality Date  . CARDIAC CATHETERIZATION     stents x 3 (03/2014)  . CHOLECYSTECTOMY    . HERNIA REPAIR    . INGUINAL HERNIA REPAIR Right 05/27/2015  Procedure: LAPAROSCOPIC REPAIR RIGHT  INGUINAL HERNIA;  Surgeon: Greer Pickerel, MD;  Location: [facility];  Service: General;  Laterality: Right;  . INSERTION OF MESH Right 05/27/2015   Procedure: INSERTION OF MESH;  Surgeon: Greer Pickerel, MD;  Location: [facility];  Service: General;  Laterality: Right;  . JOINT REPLACEMENT    . LUMBAR DISC SURGERY    . TONSILLECTOMY AND ADENOIDECTOMY    . TOTAL KNEE ARTHROPLASTY Right 07/09/2018  . TOTAL KNEE  ARTHROPLASTY Right 07/09/2018   Procedure: RIGHT TOTAL KNEE ARTHROPLASTY;  Surgeon: Garald Balding, MD;  Location: [facility];  Service: Orthopedics;  Laterality: Right;   Family History  Problem Relation Age of Onset  . Kidney disease Sister   . Colon cancer Sister 25  . Colon cancer Brother   . Bone cancer Brother   . Arthritis Mother   . Breast cancer Mother   . Heart disease Mother   . Early death Father   . Arthritis Son   . Esophageal cancer Sister   . Bladder Cancer Sister   . Rectal cancer Neg Hx   . Stomach cancer Neg Hx    Social History   Socioeconomic History  . Marital status: Married    Spouse name: Not on file  . Number of children: 2  . Years of education: Not on file  . 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 Financial resource strain: Not on file  . Food insecurity:    Worry: Not on file    Inability: Not on file  . Transportation needs:    Medical: Not on file    Non-medical: Not on file  Tobacco Use  . Smoking status: Former Smoker    Packs/day: 1.00    Years: 20.00    Pack years: 20.00    Last attempt to quit: 08/14/1988    Years since quitting: 30.1  . Smokeless tobacco: Never Used  Substance and Sexual Activity  . Alcohol use: No  . Drug use: No  . Sexual activity: Yes    Birth control/protection: None  Lifestyle  . Physical activity:    Days per week: Not on file    Minutes per session: Not on file  . Stress: Not on file  Relationships  . Social connections:    Talks on phone: Not on file    Gets together: Not on file    Attends religious service: Not on file    Active member of club or organization: Not on file    Attends meetings of clubs or organizations: Not on file    Relationship status: Not on file  . Intimate partner violence:    Fear of current or ex partner: Not on file    Emotionally abused: Not on file    Physically abused:  Not on file    Forced sexual activity: Not on file  Other Topics Concern  . Not on file  Social History Narrative   Married to [place]i [place].    HS education. Retired.    Former smoker. No drugs or ETOH.   Wears seatbelt.   Wears dentures.   Smoke detector in the home. Firearms  In the home.    Feels safe in his relationships.    ROS: Negative, with the exception of above mentioned in HPI  Objective: BP (!) 142/78 (BP Location: Left Arm, Patient Position: Sitting, Cuff Size: Normal)  Pulse (!) 55   Temp 97.9 F (36.6 C) (Oral)   Resp 16   Ht 5\' 9"  (1.753 m)   Wt 214 lb 8 oz (97.3 kg)   SpO2 94%   BMI 31.68 kg/m   Gen: Afebrile. No acute distress.  Nontoxic in appearance, well-developed, well-nourished, very pleasant Caucasian male.  Obese. HENT: AT. [place].MMM.  Eyes:Pupils Equal Round Reactive to light, Extraocular movements intact,  Conjunctiva without redness, discharge or icterus. Neck/lymp/endocrine: Supple, no lymphadenopathy, no thyromegaly CV: RRR no murmur, no edema, +2/4 P posterior tibialis pulses Chest: CTAB, no wheeze or crackles Abd: Soft. NTND. BS present.  Skin: No rashes, purpura or petechiae.  Neuro:  Normal gait. PERLA. EOMi. Alert. Oriented x3  Psych: Normal affect, dress and demeanor. Normal speech. Normal thought content and judgment.    Assessment/plan: Marcus Taylor is a 78 y.o. male present for chronic medical condition follow up.  Controlled type 2 diabetes mellitus with complication, without long-term current use of insulin (HCC) -Stable A1c 6.2--> 6.0>> 5.7 today  >> continue current regimen - Continue metformin 500 mg Monday, Wednesday and Friday only.  A1c: 6.2 --> 6.0>5.7 - Opthalmology: 04/15/2018 [facility] Dr. [name].  Records requested - pna series UTD/completed  - flu shot up-to-date 05/20/2018 - foot exam completed 09/23/2018 - urine micro on ACei - Follow-up 4-6 months  Essential hypertension/Coronary artery disease involving native  coronary artery of native heart without angina pectoris/History of ST elevation myocardial infarction (STEMI)/ -Continue lisinopril to 30 mg QD-pressures are still mildly above goal, however patient is starting to taper off of his prednisone now.  He will monitor his blood pressures doing this change and if remains higher than 135/85 he was instructed to call in and we will increase his lisinopril to 40 mg and then follow-up for a blood pressure recheck with provider in 2 weeks. - Follows yearly with cardiology.  - Continue ASA, plavix, lopressor, lisinopril and statin. - Low sodium diet encouraged. -Labs up-to-date - F/U q 4-6  months  Splenic Margicnal Zone B-cell lymphoma/thrombocytopenia: - continue to  follow up with heme/onc.  - steroid is being tapered off over the next few weeks.  He will monitor his blood pressure by this change occurs and hopefully will not need to increase his blood pressure regimen. -Prostate irregularity seen on CT scan.  Oncology desires PSA to be tested.  Normal July 2019.  Collected PSA today to ensure he has remained normal.   Return in about 4 months (around 01/26/2019) for [facility].    Electronically signed by: Howard Pouch, DO [facility]

## 2018-09-28 LAB — PSA: PSA: 1 ng/mL (ref <=4.0)

## 2018-10-01 ENCOUNTER — Encounter: Payer: Self-pay | Admitting: Family Medicine

## 2018-10-08 ENCOUNTER — Telehealth: Payer: Self-pay

## 2018-10-08 NOTE — Telephone Encounter (Signed)
-----   Message from [name], MD sent at 10/08/2018  8:16 AM EST ----- Regarding: prednisone taper Can you tell him to change prednisone to every other day and stop taking on 3/9 Thanks

## 2018-10-08 NOTE — Telephone Encounter (Signed)
Called and given below message. He is taking 2.5 mg Prednisone. He verbalized understanding.

## 2018-11-25 ENCOUNTER — Ambulatory Visit: Payer: Medicare Other | Admitting: Podiatry

## 2018-11-25 ENCOUNTER — Other Ambulatory Visit: Payer: Self-pay

## 2018-11-25 ENCOUNTER — Encounter: Payer: Self-pay | Admitting: Podiatry

## 2018-11-25 VITALS — Temp 98.1°F

## 2018-11-25 DIAGNOSIS — B351 Tinea unguium: Secondary | ICD-10-CM | POA: Diagnosis not present

## 2018-11-25 DIAGNOSIS — M79676 Pain in unspecified toe(s): Secondary | ICD-10-CM | POA: Diagnosis not present

## 2018-11-25 DIAGNOSIS — D689 Coagulation defect, unspecified: Secondary | ICD-10-CM | POA: Diagnosis not present

## 2018-11-25 NOTE — Progress Notes (Signed)
Subjective: 78 y.o. returns the office today for painful, elongated, thickened toenails which he cannot trim himself. Denies any redness or drainage around the nails. Denies any acute changes since last appointment and no new complaints today. Denies any systemic complaints such as fevers, chills, nausea, vomiting.   He is on Plavix. He has no new concerns.   Objective: AAO 3, NAD DP/PT pulses palpable, CRT less than 3 seconds Nails hypertrophic, dystrophic, elongated, brittle, discolored 10. There is tenderness overlying the nails 1-5 bilaterally. There is no surrounding erythema or drainage along the nail sites. The hallux and second digit nails are getting ingrown. No signs of infection.  Hammertoes are present.  No open lesions or pre-ulcerative lesions are identified. No pain with calf compression, swelling, warmth, erythema.  Assessment: 78 year old male presents with symptomatic onychomycosis  Plan: -Treatment options including alternatives, risks, complications were discussed -Nails sharply debrided 10 without complication/bleeding.  -Discussed daily foot inspection. If there are any changes, to call the office immediately.  -Follow-up in 9 weeks for nail trim or sooner if any problems are to arise. In the meantime, encouraged to call the office with any questions, concerns, changes symptoms.  Celesta Gentile, DPM

## 2018-12-16 ENCOUNTER — Ambulatory Visit (INDEPENDENT_AMBULATORY_CARE_PROVIDER_SITE_OTHER): Payer: Medicare Other | Admitting: Orthopaedic Surgery

## 2018-12-17 ENCOUNTER — Encounter: Payer: Self-pay | Admitting: Orthopaedic Surgery

## 2018-12-17 ENCOUNTER — Ambulatory Visit: Payer: Medicare Other | Admitting: Orthopaedic Surgery

## 2018-12-17 ENCOUNTER — Other Ambulatory Visit: Payer: Self-pay

## 2018-12-17 VITALS — BP 179/88 | HR 61 | Ht 72.0 in | Wt 209.0 lb

## 2018-12-17 DIAGNOSIS — Z96651 Presence of right artificial knee joint: Secondary | ICD-10-CM

## 2018-12-17 NOTE — Progress Notes (Signed)
Office Visit Note   Patient: Marcus Taylor           Date of Birth: 17-Jun-1941           MRN: [id number] Visit Date: 12/17/2018              Requested by: [name], DO [address] [place], [place] [address] PCP: [name], DO   Assessment & Plan: Visit Diagnoses:  1. Total knee replacement status, right     Plan:  #1: Continue his exercises.  Limit kneeling as best as possible. #2: Follow back up with his physician who has been injecting his back for possible re-injections.   Follow-Up Instructions: Return in about 3 months (around 03/19/2019).   Orders:  No orders of the defined types were placed in this encounter.  No orders of the defined types were placed in this encounter.     Procedures: No procedures performed   Clinical Data: No additional findings.   Subjective: Chief Complaint  Patient presents with  . Right Knee - Follow-up    Right TKA 07/07/18   HPI  Patient presents today for a three month follow up on his right knee. He had right total knee arthroplasty on 07/07/18 and is now 5 months out from surgery. He said that his knee is doing well. He still experiences pain at night, along with some swelling. He takes Tylenol as needed.  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  Some experiences with back pain.  He has had previous back injections.  Very happy with his results at this time.   Review of Systems  Constitutional: Negative for fatigue.  HENT: Negative for ear pain.   Eyes: Negative for pain.  Respiratory: Negative for shortness of breath.   Cardiovascular: Negative for leg swelling.  Gastrointestinal: Negative for constipation and diarrhea.  Endocrine: Negative for cold intolerance and heat intolerance.  Genitourinary: Negative for difficulty urinating.  Musculoskeletal: Positive for joint swelling.  Skin: Negative for rash.  Allergic/Immunologic: Negative for food  allergies.  Neurological: Negative for weakness.  Hematological: Does not bruise/bleed easily.  Psychiatric/Behavioral: Positive for sleep disturbance.     Objective: Vital Signs: BP (!) 179/88   Pulse 61   Ht 6' (1.829 m)   Wt 209 lb (94.8 kg)   BMI 28.35 kg/m   Physical Exam Constitutional:      Appearance: Normal appearance. He is well-developed. He is obese.  HENT:     Head: Normocephalic.  Eyes:     Pupils: Pupils are equal, round, and reactive to light.  Pulmonary:     Effort: Pulmonary effort is normal.  Skin:    General: Skin is warm and dry.  Neurological:     Mental Status: He is alert and oriented to person, place, and time.  Psychiatric:        Behavior: Behavior normal.     Ortho Exam  Exam today reveals a mild effusion without warmth or erythema.  He has range of motion today is may be a degree to a shy of full extension but flexes 120 degrees.  Good ligament stability with varus and valgus stressing.  Good strength.  Specialty Comments:  No specialty comments available.  Imaging: No results found.   PMFS History: Current Outpatient Medications  Medication Sig Dispense Refill  . acetaminophen (TYLENOL) 325 MG tablet Take 2 tablets (650 mg total) by  mouth every 6 (six) hours as needed. 100 tablet 0  . aspirin EC 81 MG tablet Take 81 mg by mouth daily.    Marland Kitchen atorvastatin (LIPITOR) 40 MG tablet Take 1 tablet (40 mg total) by mouth daily. 90 tablet 3  . clopidogrel (PLAVIX) 75 MG tablet Take 1 tablet (75 mg total) by mouth daily. 90 tablet 0  . diclofenac sodium (VOLTAREN) 1 % GEL Apply 2 g topically 4 (four) times daily. 6 Tube 1  . lisinopril (PRINIVIL,ZESTRIL) 30 MG tablet Take 1 tablet (30 mg total) by mouth daily. 90 tablet 1  . metFORMIN (GLUCOPHAGE-XR) 500 MG 24 hr tablet Take 1 tablet (500 mg total) by mouth every Monday, Wednesday, and Friday. TAKE 1 TABLET BY MOUTH WITH EVENING MEAL M-W-F 45 tablet 1  . metoprolol tartrate (LOPRESSOR) 25 MG  tablet Take 0.5 tablets (12.5 mg total) by mouth 2 (two) times daily. 90 tablet 1  . nitroGLYCERIN (NITROSTAT) 0.4 MG SL tablet Place 1 tablet (0.4 mg total) under the tongue every 5 (five) minutes as needed for chest pain. 10 tablet 0  . predniSONE (DELTASONE) 5 MG tablet Take 1 tablet (5 mg total) by mouth daily with breakfast. 30 tablet 1   Current Facility-Administered Medications  Medication Dose Route Frequency Provider Last Rate Last Dose  . 0.9 %  sodium chloride infusion  500 mL Intravenous Once Armbruster, Carlota Raspberry, MD        Patient Active Problem List   Diagnosis Date Noted  . Liver cirrhosis ([place]) 07/25/2018  . History of total right knee replacement 07/09/2018  . Overweight (BMI 25.0-29.9) 05/22/2018  . Pancytopenia, acquired ([place]) 04/19/2018  . Easy bruising 04/19/2018  . Primary osteoarthritis of right knee 04/19/2018  . Decreased hearing of both ears 03/20/2018  . Splenomegaly 03/12/2018  . Enlarged prostate without lower urinary tract symptoms (luts) 03/12/2018  . Subarachnoid hematoma ([place]) 03/12/2018  . Neck pain 02/19/2018  . IDA (iron deficiency anemia) 08/09/2016  . Thrombocytopenia ([place]) 05/30/2016  . Diabetes mellitus type 2, controlled, with complications ([place]) 44/10/4740  . Right inguinal hernia 02/03/2016  . History of colon polyps 01/27/2016  . History of ST elevation myocardial infarction (STEMI) 01/27/2016  . CAD (coronary artery disease) 01/27/2016  . Hyperlipidemia 01/27/2016  . Anemia in neoplastic disease 05/25/2014  . Splenic marginal zone b-cell lymphoma ([place]) 12/18/2011  . HTN (hypertension)    Past Medical History:  Diagnosis Date  . Anemia   . Arthritis   . Colonic polyp   . Concussion 03/05/2018  . Coronary artery disease    stents x 3 (03-2014)  . Diabetes mellitus   . Diverticulosis    throughout entire colon  . HTN (hypertension)   . Hypertriglyceridemia   . Kidney stone   . Lymphocytosis   . Metatarsal fracture left   left  5th proximal phalanx   . OSA (obstructive sleep apnea)    "Mild" by records  . PONV (postoperative nausea and vomiting)    first time, no problem since then  . RBBB 01/2016  . Splenic marginal zone b-cell lymphoma ([place]) 12/18/2011  . Splenomegaly   . STEMI (ST elevation myocardial infarction) [facility])    August 2015  . Subarachnoid hemorrhage ([place]) 03/05/2018    Family History  Problem Relation Age of Onset  . Kidney disease Sister   . Colon cancer Sister 36  . Colon cancer Brother   . Bone cancer Brother   . Arthritis Mother   . Breast cancer Mother   .  Heart disease Mother   . Early death Father   . Arthritis Son   . Esophageal cancer Sister   . Bladder Cancer Sister   . Rectal cancer Neg Hx   . Stomach cancer Neg Hx     Past Surgical History:  Procedure Laterality Date  . CARDIAC CATHETERIZATION     stents x 3 (03/2014)  . CHOLECYSTECTOMY    . HERNIA REPAIR    . INGUINAL HERNIA REPAIR Right 05/27/2015   Procedure: LAPAROSCOPIC REPAIR RIGHT  INGUINAL HERNIA;  Surgeon: Greer Pickerel, MD;  Location: [facility];  Service: General;  Laterality: Right;  . INSERTION OF MESH Right 05/27/2015   Procedure: INSERTION OF MESH;  Surgeon: Greer Pickerel, MD;  Location: [facility];  Service: General;  Laterality: Right;  . JOINT REPLACEMENT    . LUMBAR DISC SURGERY    . TONSILLECTOMY AND ADENOIDECTOMY    . TOTAL KNEE ARTHROPLASTY Right 07/09/2018  . TOTAL KNEE ARTHROPLASTY Right 07/09/2018   Procedure: RIGHT TOTAL KNEE ARTHROPLASTY;  Surgeon: Garald Balding, MD;  Location: [facility];  Service: Orthopedics;  Laterality: Right;   Social History   Occupational History    Comment: used to work as an Proofreader; now retired  Tobacco Use  . Smoking status: Former Smoker    Packs/day: 1.00    Years: 20.00    Pack years: 20.00    Last attempt to quit: 08/14/1988    Years since quitting: 30.3  . Smokeless tobacco: Never Used  Substance and Sexual Activity  . Alcohol use: No  . Drug use: No  .  Sexual activity: Yes    Birth control/protection: None

## 2018-12-18 ENCOUNTER — Ambulatory Visit (INDEPENDENT_AMBULATORY_CARE_PROVIDER_SITE_OTHER): Payer: Medicare Other | Admitting: Orthopaedic Surgery

## 2018-12-24 ENCOUNTER — Inpatient Hospital Stay: Payer: Medicare Other | Attending: Hematology and Oncology | Admitting: Hematology and Oncology

## 2018-12-24 ENCOUNTER — Other Ambulatory Visit: Payer: Self-pay

## 2018-12-24 ENCOUNTER — Encounter: Payer: Self-pay | Admitting: Hematology and Oncology

## 2018-12-24 ENCOUNTER — Inpatient Hospital Stay: Payer: Medicare Other

## 2018-12-24 DIAGNOSIS — Z79899 Other long term (current) drug therapy: Secondary | ICD-10-CM | POA: Diagnosis not present

## 2018-12-24 DIAGNOSIS — C8307 Small cell B-cell lymphoma, spleen: Secondary | ICD-10-CM

## 2018-12-24 DIAGNOSIS — Z7982 Long term (current) use of aspirin: Secondary | ICD-10-CM | POA: Diagnosis not present

## 2018-12-24 DIAGNOSIS — D61818 Other pancytopenia: Secondary | ICD-10-CM

## 2018-12-24 DIAGNOSIS — Z7984 Long term (current) use of oral hypoglycemic drugs: Secondary | ICD-10-CM | POA: Insufficient documentation

## 2018-12-24 LAB — CBC WITH DIFFERENTIAL/PLATELET
Abs Immature Granulocytes: 0.02 10*3/uL (ref 0.00–0.07)
Basophils Absolute: 0.1 10*3/uL (ref 0.0–0.1)
Basophils Relative: 1 %
Eosinophils Absolute: 0.1 10*3/uL (ref 0.0–0.5)
Eosinophils Relative: 1 %
HCT: 35.6 % — ABNORMAL LOW (ref 39.0–52.0)
Hemoglobin: 10.9 g/dL — ABNORMAL LOW (ref 13.0–17.0)
Immature Granulocytes: 0 %
Lymphocytes Relative: 76 %
Lymphs Abs: 11.9 10*3/uL — ABNORMAL HIGH (ref 0.7–4.0)
MCH: 25.2 pg — ABNORMAL LOW (ref 26.0–34.0)
MCHC: 30.6 g/dL (ref 30.0–36.0)
MCV: 82.4 fL (ref 80.0–100.0)
Monocytes Absolute: 0.9 10*3/uL (ref 0.1–1.0)
Monocytes Relative: 6 %
Neutro Abs: 2.4 10*3/uL (ref 1.7–7.7)
Neutrophils Relative %: 16 %
Platelets: 186 10*3/uL (ref 150–400)
RBC: 4.32 MIL/uL (ref 4.22–5.81)
RDW: 16.6 % — ABNORMAL HIGH (ref 11.5–15.5)
WBC: 15.3 10*3/uL — ABNORMAL HIGH (ref 4.0–10.5)
nRBC: 0 % (ref 0.0–0.2)

## 2018-12-24 NOTE — Assessment & Plan Note (Signed)
He has intermittent pancytopenia but his platelet count today is normal It is likely related to multifactorial reason, including mild splenomegaly and liver cirrhosis He is not symptomatic Observe only There is no contraindication for him to remain on Plavix and aspirin

## 2018-12-24 NOTE — Assessment & Plan Note (Signed)
He tolerated prednisone taper well He has recurrence of lymphocytosis but not symptomatic Previously, I had extensive discussion with the patient and family members Due to lack of symptoms, I recommend observation only I will see him back again next year for further follow-up

## 2018-12-24 NOTE — Progress Notes (Signed)
[facility] OFFICE PROGRESS NOTE  Patient Care Team: [name], DO as PCP - General (Family Medicine) [name], MD (Inactive) as Consulting Physician (Gastroenterology) [name], MD as Consulting Physician (Hematology and Oncology) [name], MD as Referring Physician (Cardiology) [name], MD as Consulting Physician (Neurosurgery) [name], OD as Referring Physician Garald Balding, MD as Consulting Physician (Orthopedic Surgery) Trula Slade, DPM as Consulting Physician (Podiatry)  ASSESSMENT & PLAN:  Splenic marginal zone b-cell lymphoma [facility]) He tolerated prednisone taper well He has recurrence of lymphocytosis but not symptomatic Previously, I had extensive discussion with the patient and family members Due to lack of symptoms, I recommend observation only I will see him back again next year for further follow-up  Pancytopenia, acquired [facility]) He has intermittent pancytopenia but his platelet count today is normal It is likely related to multifactorial reason, including mild splenomegaly and liver cirrhosis He is not symptomatic Observe only There is no contraindication for him to remain on Plavix and aspirin   Orders Placed This Encounter  Procedures  . CBC with Differential/Platelet    Standing Status:   Future    Standing Expiration Date:   01/28/2020    INTERVAL HISTORY: Please see below for problem oriented charting. He returns for further follow-up He has intermittent arthritis pain since prednisone taper but tolerated that just fine Denies recent infection, fever or chills No new lymphadenopathy  SUMMARY OF ONCOLOGIC HISTORY:  This is a very pleasant gentleman who was found to have abnormal CBC for the past 5 years. In April 2013, flow cytometry detectable monoclonal B-cell population suspicious for splenic/marginal zone lymphoma. Ultrasound confirmed splenomegaly. Due to lack of symptoms he was being  observed. In October 2017, he was noted to have microcytic, iron deficiency anemia. He was started on oral iron supplement On 03/05/2018, the patient had an accidental fall.  CT imaging revealed significant splenomegaly On 03/14/2018, he was started on 20 mg of prednisone daily On April 18, 2018, the dose of prednisone was reduced to 10 mg daily, subsequently taper off In December 2019, CT imaging show chronic liver cirrhosis and splenomegaly without significant lymphadenopathy  REVIEW OF SYSTEMS:   Constitutional: Denies fevers, chills or abnormal weight loss Eyes: Denies blurriness of vision Ears, nose, mouth, throat, and face: Denies mucositis or sore throat Respiratory: Denies cough, dyspnea or wheezes Cardiovascular: Denies palpitation, chest discomfort or lower extremity swelling Gastrointestinal:  Denies nausea, heartburn or change in bowel habits Skin: Denies abnormal skin rashes Lymphatics: Denies new lymphadenopathy or easy bruising Neurological:Denies numbness, tingling or new weaknesses Behavioral/Psych: Mood is stable, no new changes  All other systems were reviewed with the patient and are negative.  I have reviewed the past medical history, past surgical history, social history and family history with the patient and they are unchanged from previous note.  ALLERGIES:  has No Known Allergies.  MEDICATIONS:  Current Outpatient Medications  Medication Sig Dispense Refill  . acetaminophen (TYLENOL) 325 MG tablet Take 2 tablets (650 mg total) by mouth every 6 (six) hours as needed. 100 tablet 0  . aspirin EC 81 MG tablet Take 81 mg by mouth daily.    Marland Kitchen atorvastatin (LIPITOR) 40 MG tablet Take 1 tablet (40 mg total) by mouth daily. 90 tablet 3  . clopidogrel (PLAVIX) 75 MG tablet Take 1 tablet (75 mg total) by mouth daily. 90 tablet 0  . diclofenac sodium (VOLTAREN) 1 % GEL Apply 2 g topically 4 (four)  times daily. 6 Tube 1  . lisinopril (PRINIVIL,ZESTRIL) 30 MG tablet Take 1  tablet (30 mg total) by mouth daily. 90 tablet 1  . metFORMIN (GLUCOPHAGE-XR) 500 MG 24 hr tablet Take 1 tablet (500 mg total) by mouth every Monday, Wednesday, and Friday. TAKE 1 TABLET BY MOUTH WITH EVENING MEAL M-W-F 45 tablet 1  . metoprolol tartrate (LOPRESSOR) 25 MG tablet Take 0.5 tablets (12.5 mg total) by mouth 2 (two) times daily. 90 tablet 1  . nitroGLYCERIN (NITROSTAT) 0.4 MG SL tablet Place 1 tablet (0.4 mg total) under the tongue every 5 (five) minutes as needed for chest pain. 10 tablet 0   Current Facility-Administered Medications  Medication Dose Route Frequency Provider Last Rate Last Dose  . 0.9 %  sodium chloride infusion  500 mL Intravenous Once Armbruster, Carlota Raspberry, MD        PHYSICAL EXAMINATION: ECOG PERFORMANCE STATUS: 1 - Symptomatic but completely ambulatory  Vitals:   12/24/18 0839  BP: (!) 167/76  Pulse: 64  Resp: 17  Temp: (!) 97.5 F (36.4 C)  SpO2: 99%   Filed Weights   12/24/18 0839  Weight: 210 lb 6.4 oz (95.4 kg)    GENERAL:alert, no distress and comfortable SKIN: skin color, texture, turgor are normal, no rashes or significant lesions EYES: normal, Conjunctiva are pink and non-injected, sclera clear OROPHARYNX:no exudate, no erythema and lips, buccal mucosa, and tongue normal  NECK: supple, thyroid normal size, non-tender, without nodularity LYMPH:  no palpable lymphadenopathy in the cervical, axillary or inguinal LUNGS: clear to auscultation and percussion with normal breathing effort HEART: regular rate & rhythm and no murmurs and no lower extremity edema ABDOMEN:abdomen soft, non-tender and normal bowel sounds Musculoskeletal:no cyanosis of digits and no clubbing  NEURO: alert & oriented x 3 with fluent speech, no focal motor/sensory deficits  LABORATORY DATA:  I have reviewed the data as listed    Component Value Date/Time   NA 141 09/26/2018 0827   NA 144 05/31/2015 1404   K 4.1 09/26/2018 0827   K 4.7 05/31/2015 1404   CL 106  09/26/2018 0827   CL 102 10/07/2012 1013   CO2 27 09/26/2018 0827   CO2 29 05/31/2015 1404   GLUCOSE 121 (H) 09/26/2018 0827   GLUCOSE 99 05/31/2015 1404   GLUCOSE 199 (H) 10/07/2012 1013   BUN 15 09/26/2018 0827   BUN 18.3 05/31/2015 1404   CREATININE 0.88 09/26/2018 0827   CREATININE 0.86 03/13/2017 0759   CREATININE 0.9 05/31/2015 1404   CALCIUM 9.1 09/26/2018 0827   CALCIUM 9.2 05/31/2015 1404   PROT 6.4 (L) 09/26/2018 0827   PROT 6.3 (L) 05/31/2015 1404   ALBUMIN 4.1 09/26/2018 0827   ALBUMIN 3.9 05/31/2015 1404   AST 32 09/26/2018 0827   AST 28 05/31/2015 1404   ALT 16 09/26/2018 0827   ALT 23 05/31/2015 1404   ALKPHOS 70 09/26/2018 0827   ALKPHOS 79 05/31/2015 1404   BILITOT 0.4 09/26/2018 0827   BILITOT 0.46 05/31/2015 1404   GFRNONAA >60 09/26/2018 0827   GFRNONAA 85 03/13/2017 0759   GFRAA >60 09/26/2018 0827   GFRAA >89 03/13/2017 0759    No results found for: SPEP, UPEP  Lab Results  Component Value Date   WBC 15.3 (H) 12/24/2018   NEUTROABS 2.4 12/24/2018   HGB 10.9 (L) 12/24/2018   HCT 35.6 (L) 12/24/2018   MCV 82.4 12/24/2018   PLT 186 12/24/2018      Chemistry  Component Value Date/Time   NA 141 09/26/2018 0827   NA 144 05/31/2015 1404   K 4.1 09/26/2018 0827   K 4.7 05/31/2015 1404   CL 106 09/26/2018 0827   CL 102 10/07/2012 1013   CO2 27 09/26/2018 0827   CO2 29 05/31/2015 1404   BUN 15 09/26/2018 0827   BUN 18.3 05/31/2015 1404   CREATININE 0.88 09/26/2018 0827   CREATININE 0.86 03/13/2017 0759   CREATININE 0.9 05/31/2015 1404      Component Value Date/Time   CALCIUM 9.1 09/26/2018 0827   CALCIUM 9.2 05/31/2015 1404   ALKPHOS 70 09/26/2018 0827   ALKPHOS 79 05/31/2015 1404   AST 32 09/26/2018 0827   AST 28 05/31/2015 1404   ALT 16 09/26/2018 0827   ALT 23 05/31/2015 1404   BILITOT 0.4 09/26/2018 0827   BILITOT 0.46 05/31/2015 1404       All questions were answered. The patient knows to call the clinic with any  problems, questions or concerns. No barriers to learning was detected.  I spent 10 minutes counseling the patient face to face. The total time spent in the appointment was 15 minutes and more than 50% was on counseling and review of test results  [name], MD 12/24/2018 10:31 AM

## 2018-12-25 ENCOUNTER — Telehealth: Payer: Self-pay | Admitting: Hematology and Oncology

## 2018-12-25 NOTE — Telephone Encounter (Signed)
Tried to reach regarding schedule °

## 2019-01-13 ENCOUNTER — Other Ambulatory Visit: Payer: Self-pay | Admitting: Family Medicine

## 2019-01-13 DIAGNOSIS — I252 Old myocardial infarction: Secondary | ICD-10-CM

## 2019-01-13 DIAGNOSIS — I2510 Atherosclerotic heart disease of native coronary artery without angina pectoris: Secondary | ICD-10-CM

## 2019-01-23 DIAGNOSIS — Z955 Presence of coronary angioplasty implant and graft: Secondary | ICD-10-CM | POA: Diagnosis not present

## 2019-01-23 DIAGNOSIS — Z7902 Long term (current) use of antithrombotics/antiplatelets: Secondary | ICD-10-CM | POA: Diagnosis not present

## 2019-01-23 DIAGNOSIS — Z79899 Other long term (current) drug therapy: Secondary | ICD-10-CM | POA: Diagnosis not present

## 2019-01-23 DIAGNOSIS — Z87891 Personal history of nicotine dependence: Secondary | ICD-10-CM | POA: Diagnosis not present

## 2019-01-23 DIAGNOSIS — Z7982 Long term (current) use of aspirin: Secondary | ICD-10-CM | POA: Diagnosis not present

## 2019-01-23 DIAGNOSIS — I2510 Atherosclerotic heart disease of native coronary artery without angina pectoris: Secondary | ICD-10-CM | POA: Diagnosis not present

## 2019-01-23 DIAGNOSIS — R1013 Epigastric pain: Secondary | ICD-10-CM | POA: Diagnosis not present

## 2019-01-23 DIAGNOSIS — K219 Gastro-esophageal reflux disease without esophagitis: Secondary | ICD-10-CM | POA: Diagnosis not present

## 2019-01-23 DIAGNOSIS — Z951 Presence of aortocoronary bypass graft: Secondary | ICD-10-CM | POA: Diagnosis not present

## 2019-01-27 ENCOUNTER — Ambulatory Visit: Payer: Medicare Other | Admitting: Podiatry

## 2019-01-27 ENCOUNTER — Encounter: Payer: Self-pay | Admitting: Podiatry

## 2019-01-27 ENCOUNTER — Other Ambulatory Visit: Payer: Self-pay

## 2019-01-27 VITALS — Temp 97.7°F

## 2019-01-27 DIAGNOSIS — M79676 Pain in unspecified toe(s): Secondary | ICD-10-CM | POA: Diagnosis not present

## 2019-01-27 DIAGNOSIS — D689 Coagulation defect, unspecified: Secondary | ICD-10-CM | POA: Diagnosis not present

## 2019-01-27 DIAGNOSIS — B351 Tinea unguium: Secondary | ICD-10-CM

## 2019-01-27 NOTE — Progress Notes (Signed)
Subjective: 78 y.o. returns the office today for painful, elongated, thickened toenails which he cannot trim himself. Denies any redness or drainage around the nails. Denies any acute changes since last appointment and no new complaints today. Denies any systemic complaints such as fevers, chills, nausea, vomiting.   He is on Plavix. He has no new concerns.   Objective: AAO 3, NAD DP/PT pulses palpable, CRT less than 3 seconds Nails hypertrophic, dystrophic, elongated, brittle, discolored 10. There is tenderness overlying the nails 1-5 bilaterally. There is no surrounding erythema or drainage along the nail sites. The hallux and second digit nails are getting ingrown. No signs of infection.  Hammertoes are present.  No open lesions or pre-ulcerative lesions are identified. No pain with calf compression, swelling, warmth, erythema. Otherwise, no changes.   Assessment: 78 year old male presents with symptomatic onychomycosis  Plan: -Treatment options including alternatives, risks, complications were discussed -Nails sharply debrided 10 without complication/bleeding.  -Discussed daily foot inspection. If there are any changes, to call the office immediately.  -Follow-up in 9 weeks for nail trim or sooner if any problems are to arise. In the meantime, encouraged to call the office with any questions, concerns, changes symptoms.  Celesta Gentile, DPM

## 2019-01-28 ENCOUNTER — Other Ambulatory Visit: Payer: Self-pay

## 2019-01-28 NOTE — Patient Outreach (Signed)
[name]) Care Management  01/28/2019  Marcus Taylor 1941-05-22 [id number]   Medication Adherence call to [name] HIPPA Compliant Voice message left with a call back number. Marcus Taylor is showing past due on Atorvastatin 40 mg under [place].  [facility] Management Direct Dial [phone]  Fax [phone] [email or web address]

## 2019-01-30 ENCOUNTER — Ambulatory Visit (INDEPENDENT_AMBULATORY_CARE_PROVIDER_SITE_OTHER): Payer: Medicare Other | Admitting: Family Medicine

## 2019-01-30 ENCOUNTER — Encounter: Payer: Self-pay | Admitting: Family Medicine

## 2019-01-30 ENCOUNTER — Other Ambulatory Visit: Payer: Self-pay

## 2019-01-30 VITALS — BP 158/72 | HR 61 | Temp 97.8°F | Resp 17 | Ht 72.0 in | Wt 209.4 lb

## 2019-01-30 DIAGNOSIS — E785 Hyperlipidemia, unspecified: Secondary | ICD-10-CM | POA: Diagnosis not present

## 2019-01-30 DIAGNOSIS — D630 Anemia in neoplastic disease: Secondary | ICD-10-CM

## 2019-01-30 DIAGNOSIS — E118 Type 2 diabetes mellitus with unspecified complications: Secondary | ICD-10-CM | POA: Diagnosis not present

## 2019-01-30 DIAGNOSIS — I10 Essential (primary) hypertension: Secondary | ICD-10-CM | POA: Diagnosis not present

## 2019-01-30 DIAGNOSIS — I2510 Atherosclerotic heart disease of native coronary artery without angina pectoris: Secondary | ICD-10-CM | POA: Diagnosis not present

## 2019-01-30 LAB — COMPREHENSIVE METABOLIC PANEL
ALT: 15 U/L (ref 0–53)
AST: 28 U/L (ref 0–37)
Albumin: 4.2 g/dL (ref 3.5–5.2)
Alkaline Phosphatase: 73 U/L (ref 39–117)
BUN: 17 mg/dL (ref 6–23)
CO2: 30 mEq/L (ref 19–32)
Calcium: 8.9 mg/dL (ref 8.4–10.5)
Chloride: 108 mEq/L (ref 96–112)
Creatinine, Ser: 0.75 mg/dL (ref 0.40–1.50)
GFR: 100.81 mL/min (ref >60.00)
Glucose, Bld: 95 mg/dL (ref 70–99)
Potassium: 4.2 mEq/L (ref 3.5–5.1)
Sodium: 144 mEq/L (ref 135–145)
Total Bilirubin: 0.4 mg/dL (ref 0.2–1.2)
Total Protein: 5.7 g/dL — ABNORMAL LOW (ref 6.0–8.3)

## 2019-01-30 LAB — LIPID PANEL
Cholesterol: 73 mg/dL (ref 0–200)
HDL: 26.8 mg/dL — ABNORMAL LOW (ref >39.00)
LDL Cholesterol: 31 mg/dL (ref 0–99)
NonHDL: 46.23
Total CHOL/HDL Ratio: 3
Triglycerides: 77 mg/dL (ref 0.0–149.0)
VLDL: 15.4 mg/dL (ref 0.0–40.0)

## 2019-01-30 LAB — HEMOGLOBIN A1C: Hgb A1c MFr Bld: 6 % (ref 4.6–6.5)

## 2019-01-30 LAB — TSH: TSH: 1.54 u[IU]/mL (ref 0.35–4.50)

## 2019-01-30 MED ORDER — LISINOPRIL 40 MG PO TABS: 40.0000 mg | ORAL_TABLET | Freq: Every day | ORAL | 1 refills | Status: DC

## 2019-01-30 MED ORDER — METOPROLOL TARTRATE 25 MG PO TABS: 12.5000 mg | ORAL_TABLET | Freq: Two times a day (BID) | ORAL | 1 refills | Status: DC

## 2019-01-30 MED ORDER — METFORMIN HCL ER 500 MG PO TB24: 500.0000 mg | ORAL_TABLET | ORAL | 1 refills | Status: DC

## 2019-01-30 MED ORDER — ATORVASTATIN CALCIUM 40 MG PO TABS: 40.0000 mg | ORAL_TABLET | Freq: Every day | ORAL | 3 refills | Status: DC

## 2019-01-30 NOTE — Progress Notes (Signed)
Patient ID: Marcus Taylor, male   DOB: 1941/04/23, 78 y.o.   MRN: [id number]      Patient ID: Marcus Taylor, male  DOB: 1941-06-03, 78 y.o.   MRN: [id number] Patient Care Team    Relationship Specialty Notifications Start End  [name], DO PCP - General Family Medicine  01/26/16   [name], MD (Inactive) Consulting Physician Gastroenterology  01/26/16   [name], MD Consulting Physician Hematology and Oncology  01/26/16   [name], MD Referring Physician Cardiology  01/26/16   [name], MD Consulting Physician Neurosurgery  01/27/16   [name], OD Referring Physician   05/03/16    Comment: opthalmology   Garald Balding, MD Consulting Physician Orthopedic Surgery  06/14/18   Trula Slade, DPM Consulting Physician Podiatry  06/14/18     Subjective:  Marcus Taylor is a 78 y.o. male present for follow up on chronic medical issues.  All past medical history, surgical history, allergies, family history, immunizations, medications and social history were updated in the electronic medical record today.  Hypertension/H/o MI/CAD/stentsx3/iron deficiency anemia: Pt routinely follows with cardiology at [facility], Dr. [name]. Pt has had stent placement and MI in 2015. He is prescribed Lipitor 40 mg, lisinopril 30 mg, lopressor 12.5 (BID). He takes a daily baby asa. He is prescribed nitro, he has not needed it. Plavix recently stopped (> 61yrsince stent)   Blood pressures ranges at home are above goal and BP at outpt doctor's appt high. Patient denies chest pain, shortness of breath, dizziness or lower extremity edema.  BMP: 09/26/2018 within normal limits with the exception of very mild lower protein at 6.4 CBC: 12/24/2018 for a mild elevation in white blood cell 12.6 which is chronic, hemoglobin 11.6, hematocrit 37.9 (followed by oncology) TSH: 03/05/2018 within normal limits Lipid: 01/17/2018 total cholesterol 96, HDL 26, LDL 45, triglycerides 123 Diet: low sodium   Diabetes type 2: Patient reports compliance on metformin 500 mg Monday Wednesday and Friday. He is unable to exercise secondary to orthopedic condition. Patient denies dizziness, hyperglycemic or hypoglycemic events. Patient denies numbness, tingling in the extremities or nonhealing wounds of feet.  A1c: 6.2 --> 6.0>5.7 - Opthalmology: 04/15/2018 [place] Dr. BOswaldo Conroy  Records requested - pna series UTD/completed  - flu shot up-to-date 05/20/2018 - foot exam completed 09/23/2018 - urine micro on ACei   Past Medical History:  Diagnosis Date  . Anemia   . Arthritis   . Colonic polyp   . Concussion 03/05/2018  . Coronary artery disease    stents x 3 (03-2014)  . Diabetes mellitus   . Diverticulosis    throughout entire colon  . HTN (hypertension)   . Hypertriglyceridemia   . Kidney stone   . Lymphocytosis   . Metatarsal fracture left   left 5th proximal phalanx   . OSA (obstructive sleep apnea)    "Mild" by records  . PONV (postoperative nausea and vomiting)    first time, no problem since then  . RBBB 01/2016  . Splenic marginal zone b-cell lymphoma ([place] 12/18/2011  . Splenomegaly   . STEMI (ST elevation myocardial infarction) ([facility]    August 2015  . [place]achnoid hemorrhage ([facility] 03/05/2018   No Known Allergies Past Surgical History:  Procedure Laterality Date  . CARDIAC CATHETERIZATION     stents x 3 (03/2014)  . CHOLECYSTECTOMY    . HERNIA REPAIR    . INGUINAL HERNIA REPAIR Right 05/27/2015   Procedure: LAPAROSCOPIC REPAIR RIGHT  INGUINAL HERNIA;  Surgeon: Greer Pickerel, MD;  [facility];  Service: General;  Laterality: Right;  . INSERTION OF MESH Right 05/27/2015   Procedure: INSERTION OF MESH;  Surgeon: Greer Pickerel, MD;  Location: [facility];  Service: General;  Laterality: Right;  . JOINT REPLACEMENT    . LUMBAR DISC SURGERY    . TONSILLECTOMY AND ADENOIDECTOMY    . TOTAL KNEE ARTHROPLASTY Right 07/09/2018  . TOTAL KNEE ARTHROPLASTY Right 07/09/2018   Procedure:  RIGHT TOTAL KNEE ARTHROPLASTY;  Surgeon: Garald Balding, MD;  Location: [facility];  Service: Orthopedics;  Laterality: Right;   Family History  Problem Relation Age of Onset  . Kidney disease Sister   . Colon cancer Sister 43  . Colon cancer Brother   . Bone cancer Brother   . Arthritis Mother   . Breast cancer Mother   . Heart disease Mother   . Early death Father   . Arthritis Son   . Esophageal cancer Sister   . Bladder Cancer Sister   . Rectal cancer Neg Hx   . Stomach cancer Neg Hx    Social History   Socioeconomic History  . Marital status: Married    Spouse name: Not on file  . Number of children: 2  . Years of education: Not on file  . 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 Financial resource strain: Not on file  . Food insecurity    Worry: Not on file    Inability: Not on file  . Transportation needs    Medical: Not on file    Non-medical: Not on file  Tobacco Use  . Smoking status: Former Smoker    Packs/day: 1.00    Years: 20.00    Pack years: 20.00    Quit date: 08/14/1988    Years since quitting: 30.4  . Smokeless tobacco: Never Used  Substance and Sexual Activity  . Alcohol use: No  . Drug use: No  . Sexual activity: Yes    Birth control/protection: None  Lifestyle  . Physical activity    Days per week: Not on file    Minutes per session: Not on file  . Stress: Not on file  Relationships  . Social Herbalist on phone: Not on file    Gets together: Not on file    Attends religious service: Not on file    Active member of club or organization: Not on file    Attends meetings of clubs or organizations: Not on file    Relationship status: Not on file  . Intimate partner violence    Fear of current or ex partner: Not on file    Emotionally abused: Not on file    Physically abused: Not on file    Forced sexual activity: Not on file  Other  Topics Concern  . Not on file  Social History Narrative   Married to Gay.    HS education. Retired.    Former smoker. No drugs or ETOH.   Wears seatbelt.   Wears dentures.   Smoke detector in the home. Firearms  In the home.    Feels safe in his relationships.    ROS: Negative, with the exception of above mentioned in HPI  Objective: BP (!) 158/72   Pulse 61   Temp 97.8 F (36.6 C) (Temporal)   Resp 17  Ht 6' (1.829 m)   Wt 209 lb 6 oz (95 kg)   BMI 28.40 kg/m   Gen: Afebrile. No acute distress. Nontoxic. Pleasant , mildly overweight caucasian male.  HENT: AT. [place]. MMM. Eyes:Pupils Equal Round Reactive to light, Extraocular movements intact,  Conjunctiva without redness, discharge or icterus. Neck/lymp/endocrine: Supple,no lymphadenopathy, no thyromegaly CV: RRR no murmur, no edema, +2/4 P posterior tibialis pulses Chest: CTAB, no wheeze or crackles Neuro:  Normal gait. PERLA. EOMi. Alert. Oriented x3 Psych: Normal affect, dress and demeanor. Normal speech. Normal thought content and judgment..   Assessment/plan: Marcus Taylor is a 78 y.o. male present for chronic medical condition follow up.  Controlled type 2 diabetes mellitus with complication, without long-term current use of insulin (HCC) -Stable A1c 6.2--> 6.0>> 5.7 >>collected today - Continue metformin 500 mg Monday, Wednesday and Friday only.  - Opthalmology: 04/15/2018 [place] Dr. [name].  Records requested - pna series UTD/completed  - flu shot up-to-date 05/20/2018 - foot exam completed 09/23/2018 - urine micro on ACei - Follow-up 4-6 months  Essential hypertension/Coronary artery disease involving native coronary artery of native heart without angina pectoris/History of ST elevation myocardial infarction (STEMI)/ -Increase  lisinopril to 40 mg QD-pressures are still above goal at all oupt appts last 1 month.  - CMP and lipids collected today - Follows yearly with cardiology.  - Continue ASA,   lopressor, lisinopril and statin. - Low sodium diet encouraged. - Labs up-to-date - F/U q 4-6  months  Splenic Margicnal Zone B-cell lymphoma/thrombocytopenia: - continue to  follow up with heme/onc.  - now off steroid   Return in about 4 months (around 06/01/2019).   Orders Placed This Encounter  Procedures  . Hemoglobin A1c  . Comp Met (CMET)  . Lipid panel  . TSH    Electronically signed by: Howard Pouch, [place]

## 2019-01-30 NOTE — Patient Instructions (Addendum)
It was great to see you today. We will call you with lab results We increased your lisinopril to 40 mg (the new tab will be 40 mg) Follow up in 4 months.    Hypertension Hypertension, commonly called high blood pressure, is when the force of blood pumping through the arteries is too strong. The arteries are the blood vessels that carry blood from the heart throughout the body. Hypertension forces the heart to work harder to pump blood and may cause arteries to become narrow or stiff. Having untreated or uncontrolled hypertension can cause heart attacks, strokes, kidney disease, and other problems. A blood pressure reading consists of a higher number over a lower number. Ideally, your blood pressure should be below 120/80. The first ("top") number is called the systolic pressure. It is a measure of the pressure in your arteries as your heart beats. The second ("bottom") number is called the diastolic pressure. It is a measure of the pressure in your arteries as the heart relaxes. What are the causes? The cause of this condition is not known. What increases the risk? Some risk factors for high blood pressure are under your control. Others are not. Factors you can change  Smoking.  Having type 2 diabetes mellitus, high cholesterol, or both.  Not getting enough exercise or physical activity.  Being overweight.  Having too much fat, sugar, calories, or salt (sodium) in your diet.  Drinking too much alcohol. Factors that are difficult or impossible to change  Having chronic kidney disease.  Having a family history of high blood pressure.  Age. Risk increases with age.  Race. You may be at higher risk if you are African-American.  Gender. Men are at higher risk than women before age 34. After age 61, women are at higher risk than men.  Having obstructive sleep apnea.  Stress. What are the signs or symptoms? Extremely high blood pressure (hypertensive crisis) may cause:  Headache.   Anxiety.  Shortness of breath.  Nosebleed.  Nausea and vomiting.  Severe chest pain.  Jerky movements you cannot control (seizures). How is this diagnosed? This condition is diagnosed by measuring your blood pressure while you are seated, with your arm resting on a surface. The cuff of the blood pressure monitor will be placed directly against the skin of your upper arm at the level of your heart. It should be measured at least twice using the same arm. Certain conditions can cause a difference in blood pressure between your right and left arms. Certain factors can cause blood pressure readings to be lower or higher than normal (elevated) for a short period of time:  When your blood pressure is higher when you are in a health care provider's office than when you are at home, this is called white coat hypertension. Most people with this condition do not need medicines.  When your blood pressure is higher at home than when you are in a health care provider's office, this is called masked hypertension. Most people with this condition may need medicines to control blood pressure. If you have a high blood pressure reading during one visit or you have normal blood pressure with other risk factors:  You may be asked to return on a different day to have your blood pressure checked again.  You may be asked to monitor your blood pressure at home for 1 week or longer. If you are diagnosed with hypertension, you may have other blood or imaging tests to help your health care provider  understand your overall risk for other conditions. How is this treated? This condition is treated by making healthy lifestyle changes, such as eating healthy foods, exercising more, and reducing your alcohol intake. Your health care provider may prescribe medicine if lifestyle changes are not enough to get your blood pressure under control, and if:  Your systolic blood pressure is above 130.  Your diastolic blood  pressure is above 80. Your personal target blood pressure may vary depending on your medical conditions, your age, and other factors. Follow these instructions at home: Eating and drinking   Eat a diet that is high in fiber and potassium, and low in sodium, added sugar, and fat. An example eating plan is called the DASH (Dietary Approaches to Stop Hypertension) diet. To eat this way: ? Eat plenty of fresh fruits and vegetables. Try to fill half of your plate at each meal with fruits and vegetables. ? Eat whole grains, such as whole wheat pasta, brown rice, or whole grain bread. Fill about one quarter of your plate with whole grains. ? Eat or drink low-fat dairy products, such as skim milk or low-fat yogurt. ? Avoid fatty cuts of meat, processed or cured meats, and poultry with skin. Fill about one quarter of your plate with lean proteins, such as fish, chicken without skin, beans, eggs, and tofu. ? Avoid premade and processed foods. These tend to be higher in sodium, added sugar, and fat.  Reduce your daily sodium intake. Most people with hypertension should eat less than 1,500 mg of sodium a day.  Limit alcohol intake to no more than 1 drink a day for nonpregnant women and 2 drinks a day for men. One drink equals 12 oz of beer, 5 oz of wine, or 1 oz of hard liquor. Lifestyle   Work with your health care provider to maintain a healthy body weight or to lose weight. Ask what an ideal weight is for you.  Get at least 30 minutes of exercise that causes your heart to beat faster (aerobic exercise) most days of the week. Activities may include walking, swimming, or biking.  Include exercise to strengthen your muscles (resistance exercise), such as pilates or lifting weights, as part of your weekly exercise routine. Try to do these types of exercises for 30 minutes at least 3 days a week.  Do not use any products that contain nicotine or tobacco, such as cigarettes and e-cigarettes. If you need  help quitting, ask your health care provider.  Monitor your blood pressure at home as told by your health care provider.  Keep all follow-up visits as told by your health care provider. This is important. Medicines  Take over-the-counter and prescription medicines only as told by your health care provider. Follow directions carefully. Blood pressure medicines must be taken as prescribed.  Do not skip doses of blood pressure medicine. Doing this puts you at risk for problems and can make the medicine less effective.  Ask your health care provider about side effects or reactions to medicines that you should watch for. Contact a health care provider if:  You think you are having a reaction to a medicine you are taking.  You have headaches that keep coming back (recurring).  You feel dizzy.  You have swelling in your ankles.  You have trouble with your vision. Get help right away if:  You develop a severe headache or confusion.  You have unusual weakness or numbness.  You feel faint.  You have severe pain in  your chest or abdomen.  You vomit repeatedly.  You have trouble breathing. Summary  Hypertension is when the force of blood pumping through your arteries is too strong. If this condition is not controlled, it may put you at risk for serious complications.  Your personal target blood pressure may vary depending on your medical conditions, your age, and other factors. For most people, a normal blood pressure is less than 120/80.  Hypertension is treated with lifestyle changes, medicines, or a combination of both. Lifestyle changes include weight loss, eating a healthy, low-sodium diet, exercising more, and limiting alcohol. This information is not intended to replace advice given to you by your health care provider. Make sure you discuss any questions you have with your health care provider. Document Released: 07/31/2005 Document Revised: 06/28/2016 Document Reviewed:  06/28/2016 Elsevier Interactive Patient Education  2019 [employer].

## 2019-03-19 ENCOUNTER — Encounter: Payer: Self-pay | Admitting: Orthopaedic Surgery

## 2019-03-19 ENCOUNTER — Other Ambulatory Visit: Payer: Self-pay

## 2019-03-19 ENCOUNTER — Ambulatory Visit: Payer: Medicare Other | Admitting: Orthopaedic Surgery

## 2019-03-19 VITALS — BP 180/81 | HR 73 | Ht 72.0 in | Wt 209.0 lb

## 2019-03-19 DIAGNOSIS — Z96651 Presence of right artificial knee joint: Secondary | ICD-10-CM | POA: Diagnosis not present

## 2019-03-19 NOTE — Progress Notes (Signed)
Office Visit Note   Patient: Marcus Taylor           Date of Birth: 14-Apr-1941           MRN: [id number] Visit Date: 03/19/2019              Requested by: [name], DO [address] [place],  [place] [address] PCP: [name], DO   Assessment & Plan: Visit Diagnoses:  1. History of total right knee replacement     Plan: 9 months status post primary right total knee replacement doing well.  No limitation of activities.  No fever or chills.  Does have some swelling but notes he has not been "working out".  No feeling of instability.  Does not wear a brace.  Follow-Up Instructions: Return in about 1 year (around 03/18/2020).   Orders:  No orders of the defined types were placed in this encounter.  No orders of the defined types were placed in this encounter.     Procedures: No procedures performed   Clinical Data: No additional findings.   Subjective: Chief Complaint  Patient presents with  . Right Knee - Follow-up    Right TKA 07/07/18  Patient presents today for follow up on his right knee. He had a right total knee arthroplasty on 07/07/18. He is now 8 months out from surgery. Doing well. No complaints.   HPI  Review of Systems   Objective: Vital Signs: BP (!) 180/81   Pulse 73   Ht 6' (1.829 m)   Wt 209 lb (94.8 kg)   BMI 28.35 kg/m   Physical Exam Constitutional:      Appearance: He is well-developed.  Eyes:     Pupils: Pupils are equal, round, and reactive to light.  Pulmonary:     Effort: Pulmonary effort is normal.  Skin:    General: Skin is warm and dry.  Neurological:     Mental Status: He is alert and oriented to person, place, and time.  Psychiatric:        Behavior: Behavior normal.     Ortho Exam positive effusion right knee.  Full extension and flexed over 120 degrees without any instability.  No localized areas of tenderness.  The knee was not hot or warm.  No calf pain.  No distal edema.  Motor exam intact.  No use of  ambulatory aid.  Does have quad atrophy of both hamstring and quads both knees.  Painless range of motion of both hips  Specialty Comments:  No specialty comments available.  Imaging: No results found.   PMFS History: Patient Active Problem List   Diagnosis Date Noted  . Liver cirrhosis ([place]) 07/25/2018  . History of total right knee replacement 07/09/2018  . Overweight (BMI 25.0-29.9) 05/22/2018  . Pancytopenia, acquired ([place]) 04/19/2018  . Easy bruising 04/19/2018  . Primary osteoarthritis of right knee 04/19/2018  . Decreased hearing of both ears 03/20/2018  . Splenomegaly 03/12/2018  . Enlarged prostate without lower urinary tract symptoms (luts) 03/12/2018  . Subarachnoid hematoma ([place]) 03/12/2018  . Neck pain 02/19/2018  . IDA (iron deficiency anemia) 08/09/2016  . Thrombocytopenia ([place]) 05/30/2016  . Diabetes mellitus type 2, controlled, with complications ([place]) 58/85/0277  . Right inguinal hernia 02/03/2016  . History of colon polyps 01/27/2016  . History of ST elevation myocardial infarction (STEMI) 01/27/2016  . CAD (coronary artery disease) 01/27/2016  . Hyperlipidemia 01/27/2016  . Anemia in neoplastic disease 05/25/2014  . Splenic  marginal zone b-cell lymphoma ([place]) 12/18/2011  . HTN (hypertension)    Past Medical History:  Diagnosis Date  . Anemia   . Arthritis   . Colonic polyp   . Concussion 03/05/2018  . Coronary artery disease    stents x 3 (03-2014)  . Diabetes mellitus   . Diverticulosis    throughout entire colon  . HTN (hypertension)   . Hypertriglyceridemia   . Kidney stone   . Lymphocytosis   . Metatarsal fracture left   left 5th proximal phalanx   . OSA (obstructive sleep apnea)    "Mild" by records  . PONV (postoperative nausea and vomiting)    first time, no problem since then  . RBBB 01/2016  . Splenic marginal zone b-cell lymphoma ([place]) 12/18/2011  . Splenomegaly   . STEMI (ST elevation myocardial infarction) [facility])    August 2015   . Subarachnoid hemorrhage ([place]) 03/05/2018    Family History  Problem Relation Age of Onset  . Kidney disease Sister   . Colon cancer Sister 19  . Colon cancer Brother   . Bone cancer Brother   . Arthritis Mother   . Breast cancer Mother   . Heart disease Mother   . Early death Father   . Arthritis Son   . Esophageal cancer Sister   . Bladder Cancer Sister   . Rectal cancer Neg Hx   . Stomach cancer Neg Hx     Past Surgical History:  Procedure Laterality Date  . CARDIAC CATHETERIZATION     stents x 3 (03/2014)  . CHOLECYSTECTOMY    . HERNIA REPAIR    . INGUINAL HERNIA REPAIR Right 05/27/2015   Procedure: LAPAROSCOPIC REPAIR RIGHT  INGUINAL HERNIA;  Surgeon: Greer Pickerel, MD;  [facility];  Service: General;  Laterality: Right;  . INSERTION OF MESH Right 05/27/2015   Procedure: INSERTION OF MESH;  Surgeon: Greer Pickerel, MD;  [facility];  Service: General;  Laterality: Right;  . JOINT REPLACEMENT    . LUMBAR DISC SURGERY    . TONSILLECTOMY AND ADENOIDECTOMY    . TOTAL KNEE ARTHROPLASTY Right 07/09/2018  . 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 Smoking status: Former Smoker    Packs/day: 1.00    Years: 20.00    Pack years: 20.00    Quit date: 08/14/1988    Years since quitting: 30.6  . Smokeless tobacco: Never Used  Substance and Sexual Activity  . Alcohol use: No  . Drug use: No  . Sexual activity: Yes    Birth control/protection: None

## 2019-03-21 ENCOUNTER — Telehealth: Payer: Self-pay | Admitting: Family Medicine

## 2019-03-21 NOTE — Telephone Encounter (Addendum)
Spoke to patient. Informed him that Dr. Raoul Pitch would be able to address blood pressure readings during a phone visit. Scheduled on Monday at [place] with Dr.Kuneff. He agreed and understood. Went over how/when he is to collect his readings per Dr. Lucita Lora note below. He is to collect readings daily at least 2 hours after taking his meds and him being seating for 10 minutes. He reports reading today was 151/77. If he starts to have any other symptoms and/or readings get above 180/81 (reported on Monday per patient) he should be seen in the ED. He agreed and understood.   ----- Message from [name], DO sent at 03/20/2019  2:56 PM EDT ----- Regarding: RE: Over 75 - appt on 8/10 We can address in a phone visit. He is to collect readings daily at least 2 hours after  medicine and him being seated for 10 minutes.  As long as he is not having symptoms (chest pain, shortness of breath or dizziness) and it is not higher than the reading he reported, it can wait until Monday if they desire. If any symptoms he should be seen in the ED.  Dr. Raliegh Ip ----- Message ----- From: Edmonia Caprio Sent: 03/20/2019   1:57 PM EDT To: [name], DO Subject: RE: Over 89 - appt on 8/10                     Patient agreed to move physical until his Oct appt. However, he has had several elevated blood pressure readings, the highest being 180/81. He is quite concerned.  He is not able to do a virtual appt. Do you want to schedule a telephone visit? And do you think this can wait until Monday?  Thank you Hinton Dyer ----- Message ----- From: [name], DO Sent: 03/19/2019   6:04 PM EDT To: Edwinna Areola Hopkins Subject: RE: Over 75 - appt on 8/10                     I do not feel he needs to come in or have his physical at all until his next appt in October. He has had all the labs the last 2 months already and he is UTD with all his immunizations. We can change his next appt already scheduled in October to A CPE instead if he  is ok with that.   Dr. Raliegh Ip ----- Message ----- From: Edmonia Caprio Sent: 03/19/2019   3:46 PM EDT To: [name], DO Subject: Over 3 - appt on 8/10                         79 years old  Appt scheduled on 8/10 - CPE  Approval needed for in office visit   I have not spoken with patient.

## 2019-03-24 ENCOUNTER — Encounter: Payer: Self-pay | Admitting: Family Medicine

## 2019-03-24 ENCOUNTER — Ambulatory Visit (INDEPENDENT_AMBULATORY_CARE_PROVIDER_SITE_OTHER): Payer: Medicare Other | Admitting: Family Medicine

## 2019-03-24 ENCOUNTER — Other Ambulatory Visit: Payer: Self-pay

## 2019-03-24 VITALS — BP 162/86

## 2019-03-24 DIAGNOSIS — I10 Essential (primary) hypertension: Secondary | ICD-10-CM | POA: Diagnosis not present

## 2019-03-24 MED ORDER — AMLODIPINE BESYLATE 5 MG PO TABS: 5.0000 mg | ORAL_TABLET | Freq: Every day | ORAL | 0 refills | Status: DC

## 2019-03-24 NOTE — Progress Notes (Signed)
VIRTUAL VISIT VIA telephone  I connected with Marcus Taylor on 03/24/19 at  8:00 AM EDT by a telephopne  application and verified that I am speaking with the correct person using two identifiers. Location patient: Home Location provider: [facility], Office Persons participating in the virtual visit: Patient, Dr. Raoul Pitch and Jerilynn Mages.Denning, CMA  I discussed the limitations of evaluation and management by telemedicine and the availability of in person appointments. The patient expressed understanding and agreed to proceed.   SUBJECTIVE Chief Complaint  Patient presents with  . Hypertension    162/86 this morning. patient is concerned about his bp. patient is not dizzy. he said that he is walking sideways. highest reading 180/80 after taking medication. stated that it is staying in the 160's    HPI: Marcus Taylor is a 78 y.o. male present via telephone visit for concern over his rising BP.  Patient reports over the last 2 weeks his blood pressure has continuously been above 366 systolic.  His highest reading was 180/80 last week.  He states when it was 180/80 he did feel a little unbalanced.  This has subsided and he is asymptomatic currently.  He states his diet has not changed.  He had not been monitoring his blood pressures since approximately 2 months ago when his regimen was changed.  At that time his lisinopril was increased from 30 mg to 40 mg daily.  He states he monitor his blood pressures for a few weeks after that increase and his blood pressures are all within normal range.  ROS: See pertinent positives and negatives per HPI.  Patient Active Problem List   Diagnosis Date Noted  . Liver cirrhosis ([place]) 07/25/2018  . History of total right knee replacement 07/09/2018  . Overweight (BMI 25.0-29.9) 05/22/2018  . Pancytopenia, acquired ([place]) 04/19/2018  . Easy bruising 04/19/2018  . Primary osteoarthritis of right knee 04/19/2018  . Decreased hearing of both ears  03/20/2018  . Splenomegaly 03/12/2018  . Enlarged prostate without lower urinary tract symptoms (luts) 03/12/2018  . Subarachnoid hematoma ([place]) 03/12/2018  . Neck pain 02/19/2018  . IDA (iron deficiency anemia) 08/09/2016  . Thrombocytopenia ([place]) 05/30/2016  . Diabetes mellitus type 2, controlled, with complications ([place]) 44/10/4740  . Right inguinal hernia 02/03/2016  . History of colon polyps 01/27/2016  . History of ST elevation myocardial infarction (STEMI) 01/27/2016  . CAD (coronary artery disease) 01/27/2016  . Hyperlipidemia 01/27/2016  . Anemia in neoplastic disease 05/25/2014  . Splenic marginal zone b-cell lymphoma ([place]) 12/18/2011  . HTN (hypertension)     Social History   Tobacco Use  . Smoking status: Former Smoker    Packs/day: 1.00    Years: 20.00    Pack years: 20.00    Quit date: 08/14/1988    Years since quitting: 30.6  . Smokeless tobacco: Never Used  Substance Use Topics  . Alcohol use: No    Current Outpatient Medications:  .  acetaminophen (TYLENOL) 325 MG tablet, Take 2 tablets (650 mg total) by mouth every 6 (six) hours as needed., Disp: 100 tablet, Rfl: 0 .  aspirin EC 81 MG tablet, Take 81 mg by mouth daily., Disp: , Rfl:  .  atorvastatin (LIPITOR) 40 MG tablet, Take 1 tablet (40 mg total) by mouth daily., Disp: 90 tablet, Rfl: 3 .  lisinopril (ZESTRIL) 40 MG tablet, Take 1 tablet (40 mg total) by mouth daily., Disp: 90 tablet, Rfl: 1 .  metFORMIN (GLUCOPHAGE-XR) 500 MG 24 hr tablet,  Take 1 tablet (500 mg total) by mouth every Monday, Wednesday, and Friday. TAKE 1 TABLET BY MOUTH WITH EVENING MEAL M-W-F, Disp: 45 tablet, Rfl: 1 .  metoprolol tartrate (LOPRESSOR) 25 MG tablet, Take 0.5 tablets (12.5 mg total) by mouth 2 (two) times daily., Disp: 90 tablet, Rfl: 1 .  diclofenac sodium (VOLTAREN) 1 % GEL, Apply 2 g topically 4 (four) times daily. (Patient not taking: Reported on 03/24/2019), Disp: 6 Tube, Rfl: 1 .  nitroGLYCERIN (NITROSTAT) 0.4 MG SL  tablet, Place 1 tablet (0.4 mg total) under the tongue every 5 (five) minutes as needed for chest pain. (Patient not taking: Reported on 03/24/2019), Disp: 10 tablet, Rfl: 0  Current Facility-Administered Medications:  .  0.9 %  sodium chloride infusion, 500 mL, Intravenous, Once, Armbruster, Carlota Raspberry, MD  No Known Allergies  OBJECTIVE: BP (!) 162/86  Gen: No acute distress. Nontoxic in appearance.  HENT: AT. [place].  MMM.  Eyes:Pupils Equal Round Reactive to light, Extraocular movements intact,  Conjunctiva without redness, discharge or icterus. CV: no edema Chest: Cough or shortness of breath not present. Neuro: Normal gait. Alert. Oriented x3  Psych: Normal affect, dress and demeanor. Normal speech. Normal thought content and judgment.  ASSESSMENT AND PLAN: Marcus Taylor is a 78 y.o. male present for  Essential hypertension Elevated blood pressures without symptoms or edema. Continue lisinopril 40 and metoprolol 12.5 twice daily Added amlodipine 2.5 mg daily.  He will monitor and if blood pressure still above 140 routinely after 4 days will increase to amlodipine 5 mg daily. Follow-up in 2 weeks.   > 15 minutes spent with patient, > 50% of that time face to face   Howard Pouch, DO 03/24/2019

## 2019-03-31 ENCOUNTER — Ambulatory Visit (INDEPENDENT_AMBULATORY_CARE_PROVIDER_SITE_OTHER): Payer: Medicare Other

## 2019-03-31 ENCOUNTER — Encounter: Payer: Self-pay | Admitting: Podiatry

## 2019-03-31 ENCOUNTER — Other Ambulatory Visit: Payer: Self-pay

## 2019-03-31 ENCOUNTER — Ambulatory Visit (INDEPENDENT_AMBULATORY_CARE_PROVIDER_SITE_OTHER): Payer: Medicare Other | Admitting: Podiatry

## 2019-03-31 VITALS — Temp 98.0°F

## 2019-03-31 DIAGNOSIS — M25571 Pain in right ankle and joints of right foot: Secondary | ICD-10-CM | POA: Diagnosis not present

## 2019-03-31 DIAGNOSIS — B351 Tinea unguium: Secondary | ICD-10-CM

## 2019-03-31 DIAGNOSIS — M79676 Pain in unspecified toe(s): Secondary | ICD-10-CM | POA: Diagnosis not present

## 2019-03-31 DIAGNOSIS — M779 Enthesopathy, unspecified: Secondary | ICD-10-CM | POA: Diagnosis not present

## 2019-03-31 DIAGNOSIS — D689 Coagulation defect, unspecified: Secondary | ICD-10-CM

## 2019-04-06 NOTE — Progress Notes (Signed)
Subjective: 78 y.o. returns the office today for painful, elongated, thickened toenails which he cannot trim himself. Denies any redness or drainage around the nails.  He also states that he has been getting more pain to his right ankle as well as some swelling.  Pain is intermittent denies any recent injury or trauma.  No redness or warmth.  No new concerns. Denies any systemic complaints such as fevers, chills, nausea, vomiting.   He is on Plavix. Last A1c 5.7 on 01/30/2019  Objective: AAO 3, NAD DP/PT pulses palpable, CRT less than 3 seconds Nails hypertrophic, dystrophic, elongated, brittle, discolored 10. There is tenderness overlying the nails 1-5 bilaterally. There is no surrounding erythema or drainage along the nail sites. Mild tenderness the right ankle on the anterior aspect no specific area pinpoint tenderness..  There is decreased range of motion of the first MPJ.  No open lesions or pre-ulcerative lesions are identified. No pain with calf compression, swelling, warmth, erythema.   Assessment: 78 year old male presents with symptomatic onychomycosis; capsulitis, osteoarthritis  Plan: -Treatment options including alternatives, risks, complications were discussed -X-rays obtained reviewed.  Significant arthritic changes present the first MPJ as well as midfoot.  No evidence of acute fracture. -Nails sharply debrided 10 without complication/bleeding.  -Voltaren gel prn. Ankle brace as needed. Discussed change of shoe gear.  -Discussed daily foot inspection. If there are any changes, to call the office immediately.  -Follow-up in 9 weeks for nail trim or sooner if any problems are to arise. In the meantime, encouraged to call the office with any questions, concerns, changes symptoms.  Celesta Gentile, DPM

## 2019-04-07 ENCOUNTER — Encounter: Payer: Self-pay | Admitting: Family Medicine

## 2019-04-07 ENCOUNTER — Other Ambulatory Visit: Payer: Self-pay

## 2019-04-07 ENCOUNTER — Ambulatory Visit (INDEPENDENT_AMBULATORY_CARE_PROVIDER_SITE_OTHER): Payer: Medicare Other | Admitting: Family Medicine

## 2019-04-07 VITALS — BP 139/77 | HR 75 | Ht 72.0 in | Wt 210.0 lb

## 2019-04-07 DIAGNOSIS — E663 Overweight: Secondary | ICD-10-CM

## 2019-04-07 DIAGNOSIS — E785 Hyperlipidemia, unspecified: Secondary | ICD-10-CM

## 2019-04-07 DIAGNOSIS — I10 Essential (primary) hypertension: Secondary | ICD-10-CM

## 2019-04-07 MED ORDER — AMLODIPINE BESYLATE 5 MG PO TABS: 5.0000 mg | ORAL_TABLET | Freq: Every day | ORAL | 1 refills | Status: DC

## 2019-04-07 NOTE — Progress Notes (Signed)
VIRTUAL VISIT VIA video  I connected with [name] on 04/07/19 at 11:00 AM EDT by a video application and verified that I am speaking with the correct person using two identifiers. Location patient: Home Location provider: [facility], Office Persons participating in the virtual visit: Patient, Dr. Raoul Pitch and Jerilynn Mages.Denning, CMA  I discussed the limitations of evaluation and management by telemedicine and the availability of in person appointments. The patient expressed understanding and agreed to proceed.   SUBJECTIVE Chief Complaint  Patient presents with  . Hypertension    Pt is following up for BP. Pt checks BP everyday. 139/77 this AM. Yesterday 126/68. Mostly been upper 130's/70's.    HPI: Marcus Taylor is a 78 y.o. male present via telephone visit for concern over his rising BP.   Patient reported  his blood pressure had been  Continuously above 0000000 systolic.   He states when it was 180/80 he did feel a little unbalanced.  This has subsided and he is asymptomatic currently.   Pt reports compliance with lisinopril 40 mg QD, metoprolol 12.5 mg BID and added amlodipine 2.5 mg 2 weeks ago. Blood pressures ranges at home are now currently [id number] systolics routinely.. Patient denies chest pain, shortness of breath or lower extremity edema. Pt takes a daily baby ASA. Pt is  prescribed statin.  ROS: See pertinent positives and negatives per HPI.  Patient Active Problem List   Diagnosis Date Noted  . Liver cirrhosis ([place]) 07/25/2018  . History of total right knee replacement 07/09/2018  . Overweight (BMI 25.0-29.9) 05/22/2018  . Pancytopenia, acquired ([place]) 04/19/2018  . Easy bruising 04/19/2018  . Primary osteoarthritis of right knee 04/19/2018  . Decreased hearing of both ears 03/20/2018  . Splenomegaly 03/12/2018  . Enlarged prostate without lower urinary tract symptoms (luts) 03/12/2018  . Subarachnoid hematoma ([place]) 03/12/2018  . Neck pain 02/19/2018  . IDA (iron  deficiency anemia) 08/09/2016  . Thrombocytopenia ([place]) 05/30/2016  . Diabetes mellitus type 2, controlled, with complications ([place]) [id number]  . Right inguinal hernia 02/03/2016  . History of colon polyps 01/27/2016  . History of ST elevation myocardial infarction (STEMI) 01/27/2016  . CAD (coronary artery disease) 01/27/2016  . Hyperlipidemia 01/27/2016  . Anemia in neoplastic disease 05/25/2014  . Splenic marginal zone b-cell lymphoma ([place]) 12/18/2011  . HTN (hypertension)     Social History   Tobacco Use  . Smoking status: Former Smoker    Packs/day: 1.00    Years: 20.00    Pack years: 20.00    Quit date: 08/14/1988    Years since quitting: 30.6  . Smokeless tobacco: Never Used  Substance Use Topics  . Alcohol use: No    Current Outpatient Medications:  .  acetaminophen (TYLENOL) 325 MG tablet, Take 2 tablets (650 mg total) by mouth every 6 (six) hours as needed., Disp: 100 tablet, Rfl: 0 .  amLODipine (NORVASC) 5 MG tablet, Take 1 tablet (5 mg total) by mouth daily., Disp: 90 tablet, Rfl: 0 .  aspirin EC 81 MG tablet, Take 81 mg by mouth daily., Disp: , Rfl:  .  atorvastatin (LIPITOR) 40 MG tablet, Take 1 tablet (40 mg total) by mouth daily., Disp: 90 tablet, Rfl: 3 .  diclofenac sodium (VOLTAREN) 1 % GEL, Apply 2 g topically 4 (four) times daily., Disp: 6 Tube, Rfl: 1 .  lisinopril (ZESTRIL) 40 MG tablet, Take 1 tablet (40 mg total) by mouth daily., Disp: 90 tablet, Rfl: 1 .  metFORMIN (GLUCOPHAGE-XR)  500 MG 24 hr tablet, Take 1 tablet (500 mg total) by mouth every Monday, Wednesday, and Friday. TAKE 1 TABLET BY MOUTH WITH EVENING MEAL M-W-F, Disp: 45 tablet, Rfl: 1 .  metoprolol tartrate (LOPRESSOR) 25 MG tablet, Take 0.5 tablets (12.5 mg total) by mouth 2 (two) times daily., Disp: 90 tablet, Rfl: 1 .  nitroGLYCERIN (NITROSTAT) 0.4 MG SL tablet, Place 1 tablet (0.4 mg total) under the tongue every 5 (five) minutes as needed for chest pain., Disp: 10 tablet, Rfl: 0  Current  Facility-Administered Medications:  .  0.9 %  sodium chloride infusion, 500 mL, Intravenous, Once, Armbruster, Carlota Raspberry, MD  No Known Allergies  OBJECTIVE: BP 139/77   Pulse 75   Ht 6' (1.829 m)   Wt 210 lb (95.3 kg)   BMI 28.48 kg/m  Gen: Afebrile. No acute distress.  HENT: AT. [place].  CV: no edema Chest: no cough or shortness of breath present  Neuro: Alert. Oriented.  ASSESSMENT AND PLAN: DEMONTE WRIGHT is a 78 y.o. male present for  Essential hypertension/hyperlipidemia/overweight Improved with the addition of amlodipine 2.5 mg QD>> still not at goal. Increase to amlodipine 5 mg QD. Refills provided.  Continue lisinopril 40 and metoprolol 12.5 twice daily -Low-sodium diet, routine exercise - continue routine follow ups per scheduled.  > 15 minutes spent with patient, > 50% of that time face to face   Howard Pouch, DO 04/07/2019

## 2019-04-14 ENCOUNTER — Encounter: Payer: Self-pay | Admitting: Podiatry

## 2019-04-14 ENCOUNTER — Other Ambulatory Visit: Payer: Self-pay

## 2019-04-14 ENCOUNTER — Ambulatory Visit: Payer: Medicare Other | Admitting: Podiatry

## 2019-04-14 DIAGNOSIS — M779 Enthesopathy, unspecified: Secondary | ICD-10-CM

## 2019-04-14 DIAGNOSIS — M7751 Other enthesopathy of right foot: Secondary | ICD-10-CM

## 2019-04-14 DIAGNOSIS — M25571 Pain in right ankle and joints of right foot: Secondary | ICD-10-CM

## 2019-04-14 NOTE — Progress Notes (Signed)
Subjective: 78 year old male presents the office today for follow evaluation of the left ankle pain. States the pain is getting mildly worse.  He states the pain is intermittent.  No pain with touching that when he starts to put pressure is when he gets discomfort.  Ankle brace is been helpful.  The Voltaren is not been helping.Denies any systemic complaints such as fevers, chills, nausea, vomiting. No acute changes since last appointment, and no other complaints at this time.   Objective: AAO x3, NAD DP/PT pulses palpable bilaterally, CRT less than 3 seconds Tenderness palpation most along the anterior lateral aspect left ankle joint.  Mild edema but there is no erythema or warmth.  No area pinpoint bony tenderness.  No pain with calf compression, swelling, warmth, erythema  Assessment: Right ankle capsulitis  Plan: -All treatment options discussed with the patient including all alternatives, risks, complications.  -Steroid injection performed today.  See procedure note below.  Continue ankle brace.  Continue with supportive shoes.  Voltaren gel as needed.  Ice to the area. -Patient encouraged to call the office with any questions, concerns, change in symptoms.   Procedure: Injection Tendon/Ligament Discussed alternatives, risks, complications and verbal consent was obtained.  Location: Right ankle joint Skin Prep: Betadine Injectate: 0.5cc 0.5% marcaine plain, 0.5 cc 2% lidocaine plain and, 1 cc kenalog 10. Disposition: Patient tolerated procedure well. Injection site dressed with a band-aid.  Post-injection care was discussed and return precautions discussed.   Trula Slade DPM

## 2019-04-28 ENCOUNTER — Ambulatory Visit (INDEPENDENT_AMBULATORY_CARE_PROVIDER_SITE_OTHER): Payer: Medicare Other

## 2019-04-28 ENCOUNTER — Encounter: Payer: Self-pay | Admitting: Family Medicine

## 2019-04-28 ENCOUNTER — Other Ambulatory Visit: Payer: Self-pay

## 2019-04-28 DIAGNOSIS — Z23 Encounter for immunization: Secondary | ICD-10-CM | POA: Diagnosis not present

## 2019-05-26 ENCOUNTER — Other Ambulatory Visit: Payer: Self-pay

## 2019-05-26 DIAGNOSIS — Z20822 Contact with and (suspected) exposure to covid-19: Secondary | ICD-10-CM

## 2019-05-27 LAB — NOVEL CORONAVIRUS, NAA: SARS-CoV-2, NAA: NOT DETECTED

## 2019-06-02 ENCOUNTER — Other Ambulatory Visit: Payer: Self-pay

## 2019-06-02 ENCOUNTER — Encounter: Payer: Self-pay | Admitting: Podiatry

## 2019-06-02 ENCOUNTER — Ambulatory Visit: Payer: Medicare Other | Admitting: Podiatry

## 2019-06-02 DIAGNOSIS — M779 Enthesopathy, unspecified: Secondary | ICD-10-CM

## 2019-06-02 DIAGNOSIS — M79676 Pain in unspecified toe(s): Secondary | ICD-10-CM | POA: Diagnosis not present

## 2019-06-02 DIAGNOSIS — M7751 Other enthesopathy of right foot: Secondary | ICD-10-CM | POA: Diagnosis not present

## 2019-06-02 DIAGNOSIS — B351 Tinea unguium: Secondary | ICD-10-CM

## 2019-06-03 NOTE — Progress Notes (Signed)
Subjective: 78 y.o. returns the office today for painful, elongated, thickened toenails which he cannot trim himself. Denies any redness or drainage around the nails.  He also states that injection was helpful for the right ankle may still be some discomfort.  Requesting another injection.  Denies any acute changes since last appointment and no new complaints today. Denies any systemic complaints such as fevers, chills, nausea, vomiting.   PCP: Howard Pouch A, DO   Objective: AAO 3, NAD DP/PT pulses palpable, CRT less than 3 seconds Nails hypertrophic, dystrophic, elongated, brittle, discolored 10. There is tenderness overlying the nails 1-5 bilaterally. There is no surrounding erythema or drainage along the nail sites.  There is dried blood on the right hallux toenail, he tried trimming them himself.  No signs of infection. No open lesions or pre-ulcerative lesions are identified. There is tenderness palpation on medial aspect of the left ankle joint.  There is no pain or crepitation with ankle joint range of motion. No other areas of tenderness bilateral lower extremities. No overlying edema, erythema, increased warmth. No pain with calf compression, swelling, warmth, erythema.  Assessment: Patient presents with symptomatic onychomycosis; ankle capsulitis  Plan: -Treatment options including alternatives, risks, complications were discussed -Nails sharply debrided 10 without complication/bleeding.  Advised him not to trim the toenails himself.  Monitoring signs or symptoms of infection of the right hallux toenail. -Steroid injection performed to the right ankle.  See procedure note below. -Discussed daily foot inspection. If there are any changes, to call the office immediately.  -Follow-up in 3 months or sooner if any problems are to arise. In the meantime, encouraged to call the office with any questions, concerns, changes symptoms.  Procedure: Injection intermediate joint Discussed  alternatives, risks, complications and verbal consent was obtained.  Location: Right ankle Skin Prep: Betadine Injectate: 0.5cc 0.5% marcaine plain, 0.5 cc 2% lidocaine plain and, 1 cc kenalog 10. Disposition: Patient tolerated procedure well. Injection site dressed with a band-aid.  Post-injection care was discussed and return precautions discussed.   Celesta Gentile, DPM

## 2019-06-05 ENCOUNTER — Other Ambulatory Visit: Payer: Self-pay

## 2019-06-05 ENCOUNTER — Ambulatory Visit (INDEPENDENT_AMBULATORY_CARE_PROVIDER_SITE_OTHER): Payer: Medicare Other | Admitting: Family Medicine

## 2019-06-05 ENCOUNTER — Encounter: Payer: Self-pay | Admitting: Family Medicine

## 2019-06-05 VITALS — BP 132/80 | HR 54 | Temp 97.8°F | Resp 16 | Ht 72.0 in | Wt 213.4 lb

## 2019-06-05 DIAGNOSIS — C8307 Small cell B-cell lymphoma, spleen: Secondary | ICD-10-CM

## 2019-06-05 DIAGNOSIS — Z0000 Encounter for general adult medical examination without abnormal findings: Secondary | ICD-10-CM | POA: Diagnosis not present

## 2019-06-05 DIAGNOSIS — D500 Iron deficiency anemia secondary to blood loss (chronic): Secondary | ICD-10-CM

## 2019-06-05 DIAGNOSIS — I10 Essential (primary) hypertension: Secondary | ICD-10-CM

## 2019-06-05 DIAGNOSIS — D61818 Other pancytopenia: Secondary | ICD-10-CM

## 2019-06-05 DIAGNOSIS — Z125 Encounter for screening for malignant neoplasm of prostate: Secondary | ICD-10-CM

## 2019-06-05 DIAGNOSIS — K7460 Unspecified cirrhosis of liver: Secondary | ICD-10-CM

## 2019-06-05 DIAGNOSIS — E663 Overweight: Secondary | ICD-10-CM

## 2019-06-05 DIAGNOSIS — Z860100 Personal history of colon polyps, unspecified: Secondary | ICD-10-CM

## 2019-06-05 DIAGNOSIS — D696 Thrombocytopenia, unspecified: Secondary | ICD-10-CM

## 2019-06-05 DIAGNOSIS — E118 Type 2 diabetes mellitus with unspecified complications: Secondary | ICD-10-CM | POA: Diagnosis not present

## 2019-06-05 DIAGNOSIS — I252 Old myocardial infarction: Secondary | ICD-10-CM

## 2019-06-05 DIAGNOSIS — E785 Hyperlipidemia, unspecified: Secondary | ICD-10-CM

## 2019-06-05 DIAGNOSIS — I2510 Atherosclerotic heart disease of native coronary artery without angina pectoris: Secondary | ICD-10-CM

## 2019-06-05 LAB — COMPREHENSIVE METABOLIC PANEL
ALT: 20 U/L (ref 0–53)
AST: 35 U/L (ref 0–37)
Albumin: 4.2 g/dL (ref 3.5–5.2)
Alkaline Phosphatase: 73 U/L (ref 39–117)
BUN: 18 mg/dL (ref 6–23)
CO2: 29 mEq/L (ref 19–32)
Calcium: 9.1 mg/dL (ref 8.4–10.5)
Chloride: 106 mEq/L (ref 96–112)
Creatinine, Ser: 0.78 mg/dL (ref 0.40–1.50)
GFR: 96.26 mL/min (ref >60.00)
Glucose, Bld: 120 mg/dL — ABNORMAL HIGH (ref 70–99)
Potassium: 4.1 mEq/L (ref 3.5–5.1)
Sodium: 141 mEq/L (ref 135–145)
Total Bilirubin: 0.5 mg/dL (ref 0.2–1.2)
Total Protein: 5.9 g/dL — ABNORMAL LOW (ref 6.0–8.3)

## 2019-06-05 LAB — CBC
HCT: 32.2 % — ABNORMAL LOW (ref 39.0–52.0)
Hemoglobin: 10 g/dL — ABNORMAL LOW (ref 13.0–17.0)
MCHC: 31.1 g/dL (ref 30.0–36.0)
MCV: 77.4 fl — ABNORMAL LOW (ref 78.0–100.0)
Platelets: 193 10*3/uL (ref 150.0–400.0)
RBC: 4.16 Mil/uL — ABNORMAL LOW (ref 4.22–5.81)
RDW: 19 % — ABNORMAL HIGH (ref 11.5–15.5)
WBC: 15.6 10*3/uL — ABNORMAL HIGH (ref 4.0–10.5)

## 2019-06-05 LAB — POCT GLYCOSYLATED HEMOGLOBIN (HGB A1C)
HbA1c POC (<> result, manual entry): 5.9 % (ref 4.0–5.6)
HbA1c, POC (controlled diabetic range): 5.9 % (ref 0.0–7.0)
HbA1c, POC (prediabetic range): 5.9 % (ref 5.7–6.4)
Hemoglobin A1C: 5.9 % — AB (ref 4.0–5.6)

## 2019-06-05 LAB — PSA, MEDICARE: PSA: 1.99 ng/ml (ref 0.10–4.00)

## 2019-06-05 MED ORDER — METFORMIN HCL ER 500 MG PO TB24: 500.0000 mg | ORAL_TABLET | ORAL | 1 refills | Status: DC

## 2019-06-05 MED ORDER — METOPROLOL TARTRATE 25 MG PO TABS: 12.5000 mg | ORAL_TABLET | Freq: Two times a day (BID) | ORAL | 1 refills | Status: DC

## 2019-06-05 MED ORDER — LISINOPRIL 40 MG PO TABS: 40.0000 mg | ORAL_TABLET | Freq: Every day | ORAL | 1 refills | Status: DC

## 2019-06-05 NOTE — Patient Instructions (Signed)

## 2019-06-05 NOTE — Progress Notes (Signed)
Patient ID: Marcus Taylor, male  DOB: 1941/08/04, 78 y.o.   MRN: [id number] Patient Care Team    Relationship Specialty Notifications Start End  [name], DO PCP - General Family Medicine  01/26/16   [name], MD (Inactive) Consulting Physician Gastroenterology  01/26/16   [name], MD Consulting Physician Hematology and Oncology  01/26/16   [name], MD Referring Physician Cardiology  01/26/16   [name], MD Consulting Physician Neurosurgery  01/27/16   [name], OD Referring Physician   05/03/16    Comment: opthalmology   Garald Balding, MD Consulting Physician Orthopedic Surgery  06/14/18   Trula Slade, DPM Consulting Physician Podiatry  06/14/18     Chief Complaint  Patient presents with  . Annual Exam    Fasting. No complaints. Pt took BP medications at 7am this morning.     Subjective:  Marcus Taylor is a 78 y.o. male present for CPE. All past medical history, surgical history, allergies, family history, immunizations, medications and social history were updated in the electronic medical record today. All recent labs, ED visits and hospitalizations within the last year were reviewed.  Hypertension/H/o MI/CAD/stentsx3/iron deficiency anemia: Pt routinely follows with cardiology at [place], Dr. [name]. Pt has had stent placement and MI in 2015. He is prescribed Lipitor 40 mg, lisinopril 40 mg, lopressor 12.5 (BID), amlodipine 5 mg qd. He takes a daily baby asa. He is prescribed nitro, he has not needed it. Plavix recently stopped (> 60yr since stent)   Blood pressures ranges at home are normal. Patient denies chest pain, shortness of breath, dizziness or lower extremity edema.  BMP: 09/26/2018 within normal limits with the exception of very mild lower protein at 6.4 CBC: 12/24/2018 for a mild elevation in white blood cell 12.6 which is chronic, hemoglobin 11.6, hematocrit 37.9 (followed by oncology) [id number] within normal limits Lipid:  01/2019  Diet: low sodium  Diabetes type 2: Patient reports compliance on metformin 500 mg Monday Wednesday and Friday. He is unable to exercise secondary to orthopedic condition. Patient denies dizziness, hyperglycemic or hypoglycemic events. Patient denies numbness, tingling in the extremities or nonhealing wounds of feet. He is receiving steroid injections in his ankle.  A1c: 6.2 --> 6.0>5.7>> 5.9 - Opthalmology: 04/15/2018 [place] Dr. [name].  Records requested - pna series UTD/completed  - flu shot up-to-date 2020 - foot exam completed 09/23/2018 - urine micro on ACei  Health maintenance: updated 06/05/19 Colonoscopy: last screen 04/18/2017, recommend follow up 3 year. Completed by Dr. Havery Moros. Immunizations:  tdap UTD 2015, influenza UTD 2018 (encouraged yearly), PNA series completed. shingrix completed.  PSA:  Lab Results  Component Value Date   PSA 1.0 09/27/2018   PSA 1.85 03/12/2018  , pt was counseled on prostate cancer screenings.  Assistive device: none Oxygen [id number] Patient has a Dental home. Hospitalizations/ED visits: reviewed  Depression screen [facility] 2/9 06/05/2019 06/14/2018 05/20/2018 03/20/2018 03/12/2018  Decreased Interest 0 0 0 0 0  Down, Depressed, Hopeless 0 0 0 0 0  PHQ - 2 Score 0 0 0 0 0   No flowsheet data found.       Fall Risk  06/05/2019 09/27/2018 06/14/2018 05/20/2018 03/20/2018  Falls in the past year? 0 1 1 Yes Yes  Comment - - tripping over laundry - -  Number falls in past yr: - 0 0 2 or more 1  Injury with Fall? - 1 1 Yes Yes  Risk Factor Category  - - -  High Fall Risk -  Risk for fall due to : Impaired vision;Medication side effect Impaired balance/gait;Impaired vision;Medication side effect - - -  Follow up Education provided;Falls evaluation completed;Falls prevention discussed Education provided Falls prevention discussed Falls evaluation completed;Falls prevention discussed Falls evaluation completed;Falls prevention discussed       Immunization History  Administered Date(s) Administered  . Fluad Quad(high Dose 65+) 04/28/2019  . Influenza Split 05/07/2012  . Influenza, High Dose Seasonal PF 05/03/2016, 07/02/2017, 05/20/2018  . Influenza,inj,Quad PF,6+ Mos 09/22/2013, 05/25/2014, 05/31/2015  . Pneumococcal Conjugate-13 09/28/2014  . Pneumococcal Polysaccharide-23 10/05/2011  . Tdap 03/24/2014  . Zoster Recombinat (Shingrix) 05/30/2018, 09/17/2018     Past Medical History:  Diagnosis Date  . Anemia   . Arthritis   . Colonic polyp   . Concussion 03/05/2018  . Coronary artery disease    stents x 3 (03-2014)  . Diabetes mellitus   . Diverticulosis    throughout entire colon  . HTN (hypertension)   . Hypertriglyceridemia   . Kidney stone   . Lymphocytosis   . Metatarsal fracture left   left 5th proximal phalanx   . OSA (obstructive sleep apnea)    "Mild" by records  . PONV (postoperative nausea and vomiting)    first time, no problem since then  . RBBB 01/2016  . Splenic marginal zone b-cell lymphoma ([place]) 12/18/2011  . Splenomegaly   . STEMI (ST elevation myocardial infarction) [facility])    August 2015  . Subarachnoid hematoma ([place]) 03/12/2018  . Subarachnoid hemorrhage ([place]) 03/05/2018   No Known Allergies Past Surgical History:  Procedure Laterality Date  . CARDIAC CATHETERIZATION     stents x 3 (03/2014)  . CHOLECYSTECTOMY    . HERNIA REPAIR    . INGUINAL HERNIA REPAIR Right 05/27/2015   Procedure: LAPAROSCOPIC REPAIR RIGHT  INGUINAL HERNIA;  Surgeon: Greer Pickerel, MD;  Location: [facility];  Service: General;  Laterality: Right;  . INSERTION OF MESH Right 05/27/2015   Procedure: INSERTION OF MESH;  Surgeon: Greer Pickerel, MD;  Location: [facility];  Service: General;  Laterality: Right;  . JOINT REPLACEMENT    . LUMBAR DISC SURGERY    . TONSILLECTOMY AND ADENOIDECTOMY    . TOTAL KNEE ARTHROPLASTY Right 07/09/2018  . TOTAL KNEE ARTHROPLASTY Right 07/09/2018   Procedure: RIGHT TOTAL KNEE ARTHROPLASTY;   Surgeon: Garald Balding, MD;  Location: [place];  Service: Orthopedics;  Laterality: Right;   Family History  Problem Relation Age of Onset  . Kidney disease Sister   . Colon cancer Sister 19  . Colon cancer Brother   . Bone cancer Brother   . Arthritis Mother   . Breast cancer Mother   . Heart disease Mother   . Early death Father   . Arthritis Son   . Esophageal cancer Sister   . Bladder Cancer Sister   . Rectal cancer Neg Hx   . Stomach cancer Neg Hx    Social History   Social History Narrative   Married to Marcus Taylor.    HS education. Retired.    Former smoker. No drugs or ETOH.   Wears seatbelt.   Wears dentures.   Smoke detector in the home. Firearms  In the home.    Feels safe in his relationships.     Allergies as of 06/05/2019   No Known Allergies     Medication List       Accurate as of June 05, 2019 10:04 AM. If you have any questions, ask your  nurse or doctor.        acetaminophen 325 MG tablet Commonly known as: Tylenol Take 2 tablets (650 mg total) by mouth every 6 (six) hours as needed.   amLODipine 5 MG tablet Commonly known as: NORVASC Take 1 tablet (5 mg total) by mouth daily.   aspirin EC 81 MG tablet Take 81 mg by mouth daily.   atorvastatin 40 MG tablet Commonly known as: LIPITOR Take 1 tablet (40 mg total) by mouth daily.   diclofenac sodium 1 % Gel Commonly known as: VOLTAREN Apply 2 g topically 4 (four) times daily.   lisinopril 40 MG tablet Commonly known as: ZESTRIL Take 1 tablet (40 mg total) by mouth daily.   metFORMIN 500 MG 24 hr tablet Commonly known as: GLUCOPHAGE-XR Take 1 tablet (500 mg total) by mouth every Monday, Wednesday, and Friday. TAKE 1 TABLET BY MOUTH WITH EVENING MEAL M-W-F Start taking on: June 06, 2019   metoprolol tartrate 25 MG tablet Commonly known as: LOPRESSOR Take 0.5 tablets (12.5 mg total) by mouth 2 (two) times daily.   nitroGLYCERIN 0.4 MG SL tablet Commonly known as: NITROSTAT  Place 1 tablet (0.4 mg total) under the tongue every 5 (five) minutes as needed for chest pain.      All past medical history, surgical history, allergies, family history, immunizations andmedications were updated in the EMR today and reviewed under the history and medication portions of their EMR.      ROS: 14 pt review of systems performed and negative (unless mentioned in an HPI)  Objective: BP 132/80 (BP Location: Left Arm, Patient Position: Sitting, Cuff Size: Normal)   Pulse (!) 54   Temp 97.8 F (36.6 C) (Temporal)   Resp 16   Ht 6' (1.829 m)   Wt 213 lb 6 oz (96.8 kg)   SpO2 98%   BMI 28.94 kg/m  Gen: Afebrile. No acute distress. Nontoxic in appearance, well-developed, well-nourished,  Pleasant overweight male.  HENT: AT. [place]. Bilateral TM visualized and normal in appearance, normal external auditory canal. MMM, no oral lesions, adequate dentition. Bilateral nares within normal limits. Throat without erythema, ulcerations or exudates. no Cough on exam, no hoarseness on exam. Eyes:Pupils Equal Round Reactive to light, Extraocular movements intact,  Conjunctiva without redness, discharge or icterus. Neck/lymp/endocrine: Supple,no lymphadenopathy, no thyromegaly CV: RRR no murmur, no edema, +2/4 P posterior tibialis pulses. Chest: CTAB, no wheeze, rhonchi or crackles. normal Respiratory effort. good Air movement. Abd: Soft. NTND. BS present. no Masses palpated. No hepatosplenomegaly. No rebound tenderness or guarding. Skin: no rashes, purpura or petechiae. Warm and well-perfused. Skin intact. Neuro/Msk:  Normal gait. PERLA. EOMi. Alert. Oriented x3.  Cranial nerves II through XII intact. Muscle strength 5/5 upper/lower extremity. DTRs equal bilaterally. Psych: Normal affect, dress and demeanor. Normal speech. Normal thought content and judgment.   Assessment/plan: SHAVON TOMSON is a 78 y.o. male present for CPE Controlled type 2 diabetes mellitus with complication, without  long-term current use of insulin ([place]) - stable. A1c 6.2--> 6.0>> 5.7 >>5.9 today - continue metformin 500 mg Monday, Wednesday and Friday only.  - Opthalmology: 11/13/2018, [place] Dr. [name].  Records requested again.  - pna series UTD/completed  - flu shot up-to-date 2020 - foot exam completed 09/23/2018 - urine micro on ACei - Follow-up 6 months  Essential hypertension/Coronary artery disease involving native coronary artery of native heart without angina pectoris/History of ST elevation myocardial infarction (STEMI)/ - stable. Home pressures are even better  - cbc, cmp collected  today - Follows yearly with cardiology.  - Continue ASA,  lopressor, lisinopril,amlodipine and statin. - Low sodium diet encouraged. - F/U q 6  months  Splenic Margicnal Zone B-cell lymphoma/thrombocytopenia: - continue to  follow up with heme/onc.  - now off steroid  -Low-sodium diet, routine exercise - continue routine follow ups per scheduled.  Encounter for preventive Health exam :  Patient was encouraged to exercise greater than 150 minutes a week. Patient was encouraged to choose a diet filled with fresh fruits and vegetables, and lean meats. AVS provided to patient today for education/recommendation on gender speColonoscopy: last screen 04/18/2017, recommend follow up 3 year. Completed by Dr. Havery Moros. Immunizations:  tdap UTD 2015, influenza UTD 2018 (encouraged yearly), PNA series completed. shingrix completed.cific health and safety maintenance.  Return in about 6 months (around 12/04/2019).  Note is dictated utilizing voice recognition software. Although note has been proof read prior to signing, occasional typographical errors still can be missed. If any questions arise, please do not hesitate to call for verification.  Electronically signed by: Howard Pouch, DO [place]

## 2019-06-06 ENCOUNTER — Telehealth: Payer: Self-pay | Admitting: Family Medicine

## 2019-06-06 DIAGNOSIS — D509 Iron deficiency anemia, unspecified: Secondary | ICD-10-CM

## 2019-06-06 DIAGNOSIS — D630 Anemia in neoplastic disease: Secondary | ICD-10-CM | POA: Diagnosis not present

## 2019-06-06 NOTE — Telephone Encounter (Addendum)
Contacted the lab and faxed over [facility] req, add on complete.   Patient aware he does not need lab appointment but he needs to come in to pick up IFOB cards.  He will come Monday to pick up.

## 2019-06-06 NOTE — Telephone Encounter (Signed)
Pt was called and given lab results, he verbalized understanding. Pt was driving and could not schedule lab visit, states he will call back next week to have that done.   Lattie Haw can a iron level be added to his labs from yesterday? He would still have to come for the FOBT pick up.

## 2019-06-06 NOTE — Telephone Encounter (Signed)
Please inform patient the following information: His liver/kidney and electrolytes are normal and his protein counts are stable from prior collections.  - is PSA, prostate cancer screening is normal. -His red blood cells, hemoglobin and hematocrit are little lower than they have been over the last year.  Last hemoglobin 10.9, it is 10 today.  Hematocrit was 35.6, and it is 32.2 now.  His white count is a little elevated from before at 15.3, to 15.6 now.  His platelet counts are stable.  I will forward these to his oncologist for their records. Since he has slowly continued to drop in his hemoglobin, they may want to get him back for follow up sooner.   I would also like him to have a lab appt only to have his iron levels tested and complete an at home FOBT card set to make sure he is not losing blood from colon.

## 2019-06-07 LAB — IRON,TIBC AND FERRITIN PANEL
%SAT: 7 % (calc) — ABNORMAL LOW (ref 20–48)
Ferritin: 8 ng/mL — ABNORMAL LOW (ref 24–380)
Iron: 23 ug/dL — ABNORMAL LOW (ref 50–180)
TIBC: 339 mcg/dL (calc) (ref 250–425)

## 2019-06-09 ENCOUNTER — Telehealth: Payer: Self-pay | Admitting: Family Medicine

## 2019-06-09 MED ORDER — FERROUS SULFATE 324 (65 FE) MG PO TBEC: 1.0000 | DELAYED_RELEASE_TABLET | Freq: Two times a day (BID) | ORAL | 2 refills | Status: DC

## 2019-06-09 NOTE — Telephone Encounter (Signed)
2 month appt SCHEDULED

## 2019-06-09 NOTE — Telephone Encounter (Signed)
Pt was called and VM was left to return call  °

## 2019-06-09 NOTE — Telephone Encounter (Signed)
Please inform patient the following information: His iron panel resulted with rather significant iron deficiency-even lower than in the past. Restart ferrous sulfate 325 twice daily with food, this has been called in for him.  Watch for signs of constipation with use of iron and use a stool softener if necessary to avoid constipation.   Would encouraged him to complete the FOBT cards, if this is positive or he notices any blood in his stool we will need to get him back to gastroenterology sooner than due 04/2020.   I have forwarded the results to his oncologist as well. Follow-up in 2 months after starting iron here, we will recollect his iron panel and CBC at that time to ensure he is responding to iron supplementation-if he has not then he will need to see his oncologist to consider IV transfusion

## 2019-06-09 NOTE — Telephone Encounter (Signed)
Pt was called and given lab results/instructions. Labs faxed to oncology.

## 2019-06-20 ENCOUNTER — Ambulatory Visit: Payer: Medicare Other

## 2019-06-29 ENCOUNTER — Other Ambulatory Visit: Payer: Self-pay | Admitting: Family Medicine

## 2019-06-29 DIAGNOSIS — E118 Type 2 diabetes mellitus with unspecified complications: Secondary | ICD-10-CM

## 2019-07-01 ENCOUNTER — Other Ambulatory Visit: Payer: Medicare Other

## 2019-07-01 ENCOUNTER — Telehealth: Payer: Self-pay

## 2019-07-01 DIAGNOSIS — D509 Iron deficiency anemia, unspecified: Secondary | ICD-10-CM

## 2019-07-01 LAB — HEMOCCULT SLIDES (X 3 CARDS)
Fecal Occult Blood: NEGATIVE
OCCULT 1: NEGATIVE
OCCULT 2: NEGATIVE
OCCULT 3: NEGATIVE
OCCULT 4: NEGATIVE
OCCULT 5: NEGATIVE

## 2019-07-01 NOTE — Telephone Encounter (Signed)
Diane from [facility] called and stated that we ordered the hemoccult cards wrong. Canceled order and sent in right order for cards.

## 2019-07-01 NOTE — Telephone Encounter (Signed)
error 

## 2019-07-11 IMAGING — CT CT ABD-PELV W/ CM
2 of 5 series · 15 of 46 positions shown, 17 images · IV contrast (APPLIED)
Comparison: CT 03/05/2018.  No other comparison CTs available.

CLINICAL DATA: History of splenic marginal zone B-cell lymphoma.
Follow-up splenomegaly.

EXAM:
CT ABDOMEN AND PELVIS WITH CONTRAST
TECHNIQUE: Multidetector CT imaging of the abdomen and pelvis was performed
using the standard protocol following bolus administration of
intravenous contrast.
CONTRAST:  100mL OMNIPAQUE IOHEXOL 300 MG/ML  SOLN

[Series 2: axial st · axial · 0.98mm/px · z∈[-646,-206]mm · 12 of 102 slices shown, 14 images]
[im 7/102  soft-tissue]
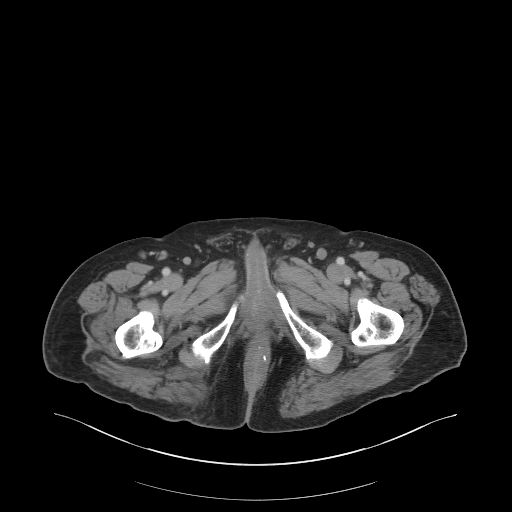
[im 7/102  bone]
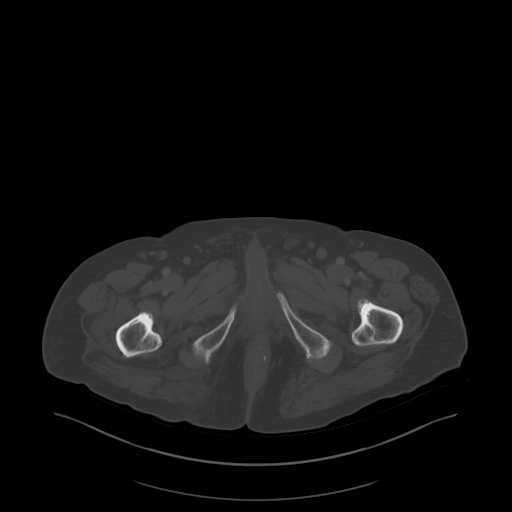
[im 14/102  soft-tissue]
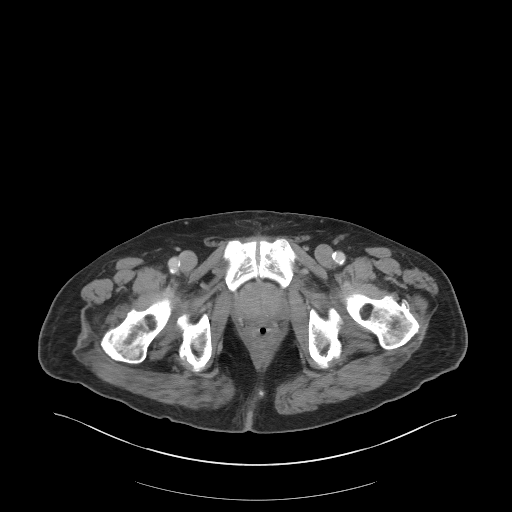
[im 21/102  soft-tissue]
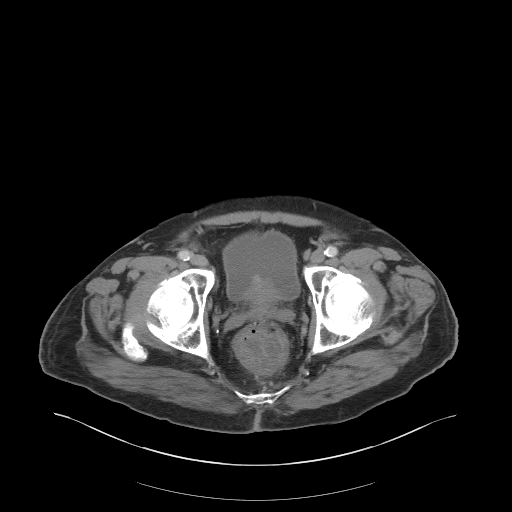
[im 34/102  soft-tissue]
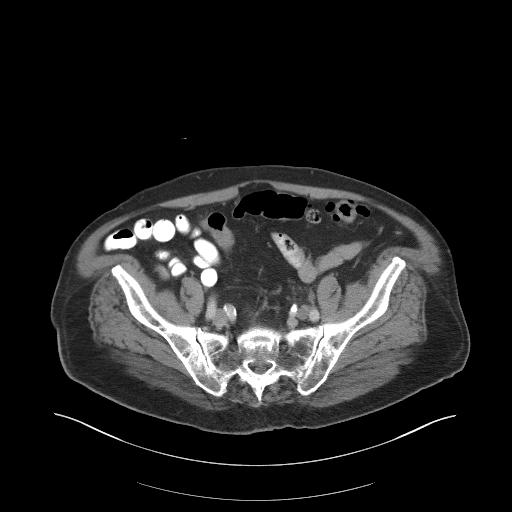
[im 41/102  soft-tissue]
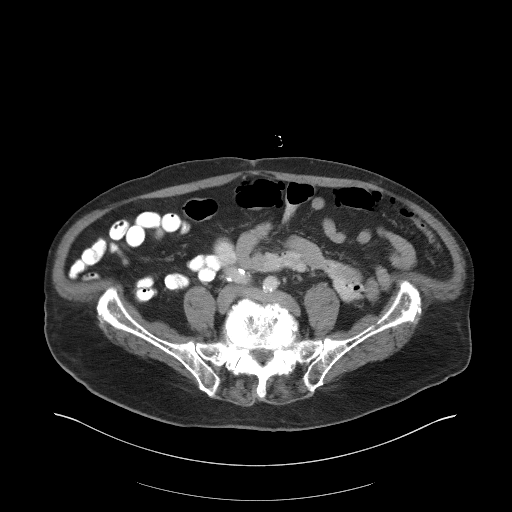
[im 48/102  soft-tissue]
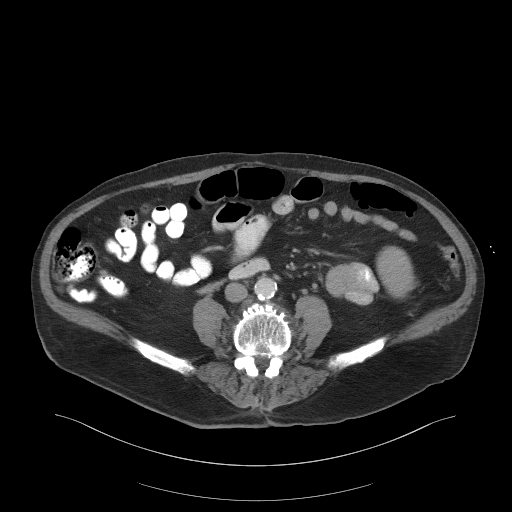
[im 54/102  soft-tissue]
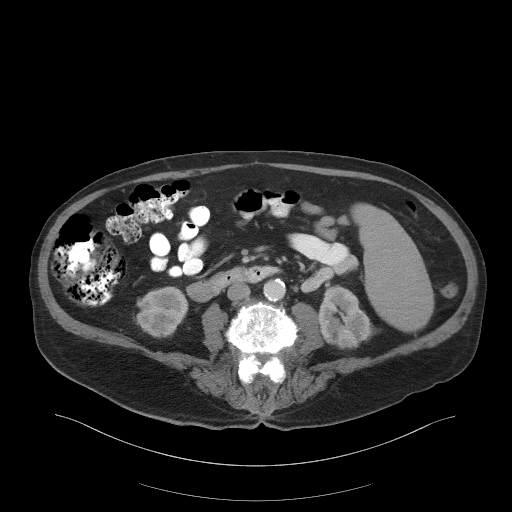
[im 61/102  soft-tissue]
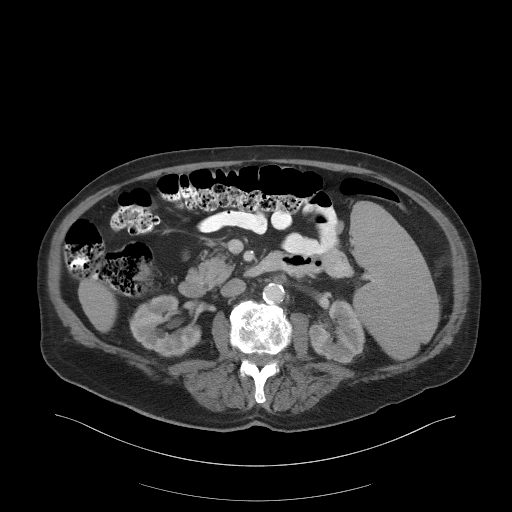
[im 68/102  soft-tissue]
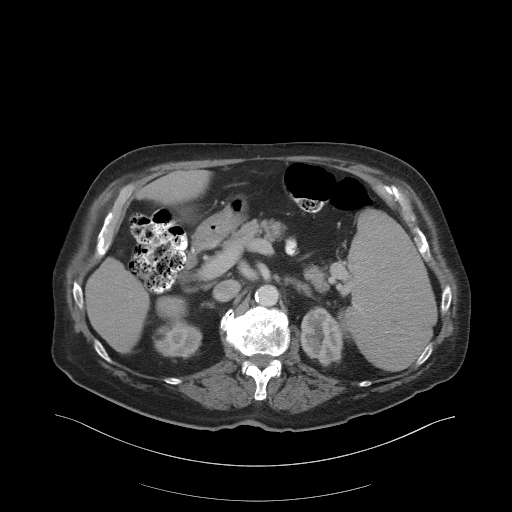
[im 68/102  bone]
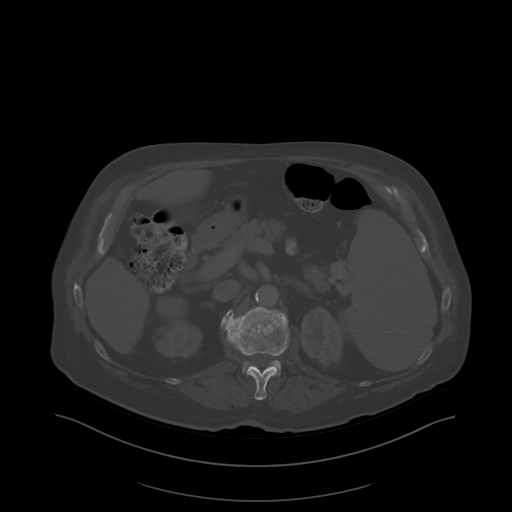
[im 81/102  soft-tissue]
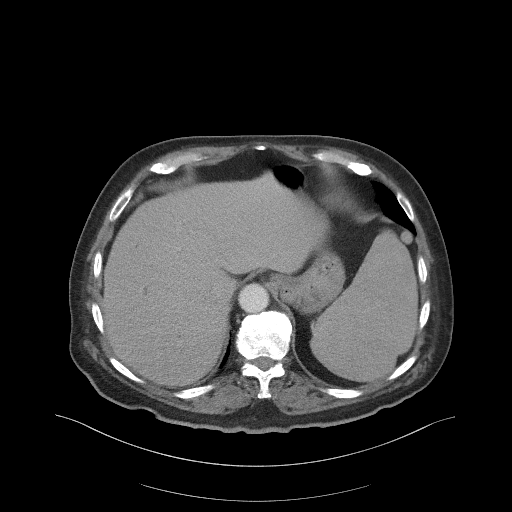
[im 88/102  soft-tissue]
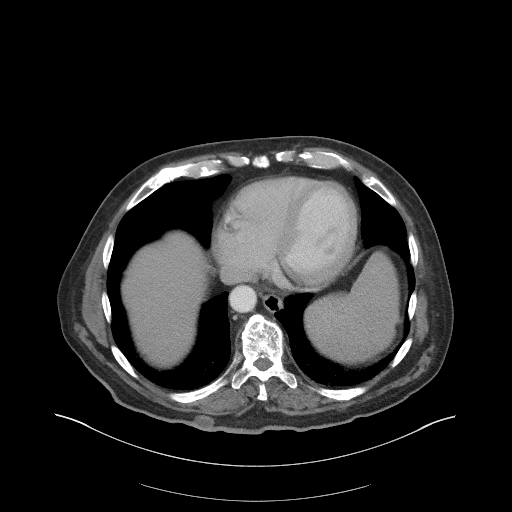
[im 95/102  soft-tissue]
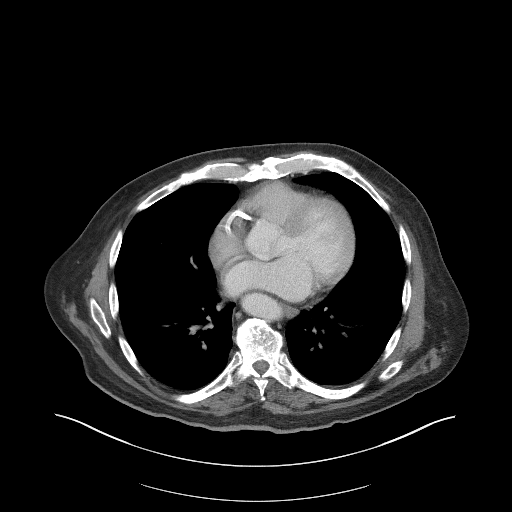

[Series 4: coronal st · coronal · 0.94mm/px · 3 of 100 slices shown]
[im 34/100  soft-tissue]
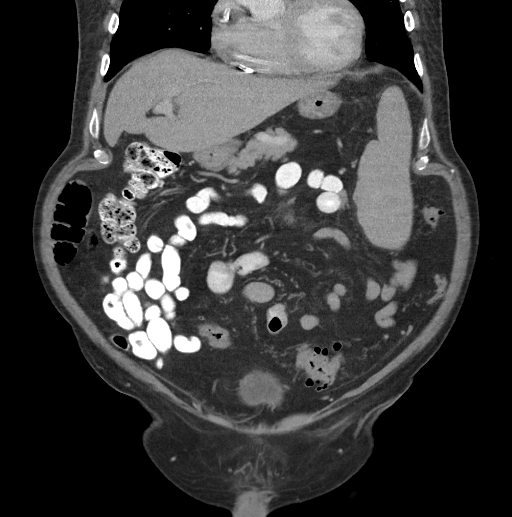
[im 45/100  soft-tissue]
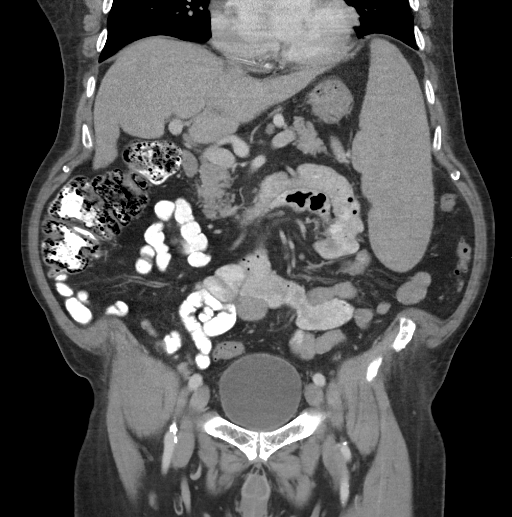
[im 56/100  soft-tissue]
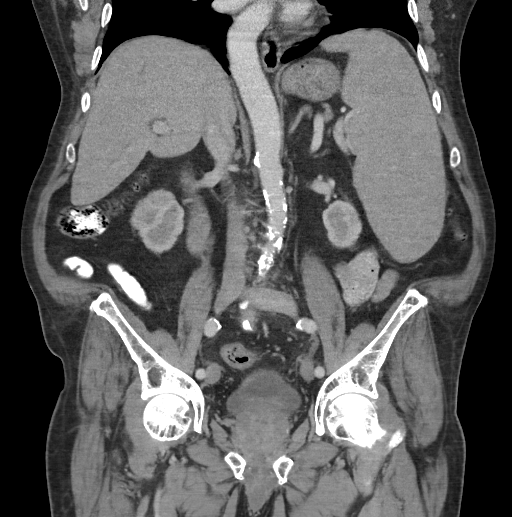

[15 of 46 positions shown; findings below may reference images not displayed]

FINDINGS: Lower chest: Prominent three-vessel coronary artery atherosclerosis.
There's lesser atherosclerosis of the visualized thoracic aorta.
There is stable mild scarring at both lung bases. No significant
pleural or pericardial effusion. A sebaceous cyst is noted in the
lower right back which appears unchanged.

Hepatobiliary: Mild contour irregularity of the liver with relative
enlargement the left and caudate lobes. No focal hepatic
abnormalities are identified. There is stable mild intrahepatic and
extrahepatic biliary dilatation status post cholecystectomy, within
physiologic limits.

Pancreas: Unremarkable. No pancreatic ductal dilatation or
surrounding inflammatory changes.

Spleen: Measures 22.4 x 16.6 x 8.9 cm (volume = 6455 cm^3)
consistent with moderate splenomegaly. No focal abnormality.

Adrenals/Urinary Tract: Both adrenal glands appear normal. Both
kidneys demonstrate mild cortical thinning and scattered low-density
lesions which are too small to optimally characterize, although
likely all cysts. No evidence of urinary tract calculus or
hydronephrosis. The bladder appears normal.

Stomach/Bowel: The stomach, small bowel, appendix and proximal colon
appear normal. There are scattered diverticular changes in the
descending and sigmoid colon. There is diffuse rectal wall
thickening with possible mild inflammation in the perirectal fat. No
evidence of bowel obstruction or perforation.

Vascular/Lymphatic: There are no enlarged abdominal lymph nodes.
There are mildly prominent right external iliac nodes, measuring 9
mm on image 80/2 and 8 mm on image 81/2. There is diffuse aortic and
branch vessel atherosclerosis. No acute vascular findings.

Reproductive: Stable mild prostate gland.

Other: No evidence of abdominal wall mass or hernia. No ascites.

Musculoskeletal: No acute or significant osseous findings.
Multilevel spondylosis status post L3-4 and L4-5 laminectomy.
IMPRESSION: 1. Stable nonspecific splenomegaly without focal abnormality. Given
suspected changes of hepatic cirrhosis, this may be secondary to
portal hypertension.
2. Mildly prominent right external iliac lymph nodes. No other
adenopathy to suggest recurrent lymphoma.
3. Diffuse rectal wall thickening with mild soft tissue stranding in
the perirectal fat. This appearance is most consistent with
proctitis. Clinical follow-up recommended to exclude neoplasm.
4. Stable mild biliary dilatation post cholecystectomy, within
physiologic limits.
5. Coronary and Aortic Atherosclerosis ([id number]-WVA.A).
6. These results will be called to the ordering clinician or
representative by the Radiologist Assistant, and communication
documented in the PACS or zVision Dashboard.

## 2019-07-29 ENCOUNTER — Other Ambulatory Visit (INDEPENDENT_AMBULATORY_CARE_PROVIDER_SITE_OTHER): Payer: Self-pay | Admitting: Orthopaedic Surgery

## 2019-07-29 NOTE — Telephone Encounter (Signed)
Ok to renew?  

## 2019-07-29 NOTE — Telephone Encounter (Signed)
Please advise 

## 2019-08-04 ENCOUNTER — Ambulatory Visit: Payer: Medicare Other | Admitting: Podiatry

## 2019-08-04 ENCOUNTER — Other Ambulatory Visit: Payer: Self-pay

## 2019-08-04 ENCOUNTER — Ambulatory Visit (INDEPENDENT_AMBULATORY_CARE_PROVIDER_SITE_OTHER): Payer: Medicare Other | Admitting: Family Medicine

## 2019-08-04 ENCOUNTER — Encounter: Payer: Self-pay | Admitting: Family Medicine

## 2019-08-04 VITALS — BP 133/73 | HR 65 | Temp 97.5°F | Resp 17 | Ht 72.0 in | Wt 215.0 lb

## 2019-08-04 DIAGNOSIS — D509 Iron deficiency anemia, unspecified: Secondary | ICD-10-CM | POA: Diagnosis not present

## 2019-08-04 LAB — CBC WITH DIFFERENTIAL/PLATELET
Basophils Absolute: 0 10*3/uL (ref 0.0–0.1)
Basophils Relative: 0.2 % (ref 0.0–3.0)
Eosinophils Absolute: 0.1 10*3/uL (ref 0.0–0.7)
Eosinophils Relative: 0.8 % (ref 0.0–5.0)
HCT: 35.5 % — ABNORMAL LOW (ref 39.0–52.0)
Hemoglobin: 11.1 g/dL — ABNORMAL LOW (ref 13.0–17.0)
Lymphocytes Relative: 79.1 % — ABNORMAL HIGH (ref 12.0–46.0)
Lymphs Abs: 12.4 10*3/uL — ABNORMAL HIGH (ref 0.7–4.0)
MCHC: 31.4 g/dL (ref 30.0–36.0)
MCV: 79.4 fl (ref 78.0–100.0)
Monocytes Absolute: 0.4 10*3/uL (ref 0.1–1.0)
Monocytes Relative: 2.7 % — ABNORMAL LOW (ref 3.0–12.0)
Neutro Abs: 2.7 10*3/uL (ref 1.4–7.7)
Neutrophils Relative %: 17.2 % — ABNORMAL LOW (ref 43.0–77.0)
Platelets: 191 10*3/uL (ref 150.0–400.0)
RBC: 4.47 Mil/uL (ref 4.22–5.81)
RDW: 19.6 % — ABNORMAL HIGH (ref 11.5–15.5)
WBC: 15.6 10*3/uL — ABNORMAL HIGH (ref 4.0–10.5)

## 2019-08-04 NOTE — Progress Notes (Signed)
Patient ID: ARDA CESAR, male   DOB: Oct 16, 1940, 77 y.o.   MRN: [id number]      Patient ID: WESTLEE WAYMENT, male  DOB: 1940/09/15, 78 y.o.   MRN: [id number] Patient Care Team    Relationship Specialty Notifications Start End  [name], DO PCP - General Family Medicine  01/26/16   [name], MD (Inactive) Consulting Physician Gastroenterology  01/26/16   [name], MD Consulting Physician Hematology and Oncology  01/26/16   [name], MD Referring Physician Cardiology  01/26/16   [name], MD Consulting Physician Neurosurgery  01/27/16   [name], OD Referring Physician   05/03/16    Comment: opthalmology   Garald Balding, MD Consulting Physician Orthopedic Surgery  06/14/18   Trula Slade, DPM Consulting Physician Podiatry  06/14/18    Chief Complaint  Patient presents with  . Follow-up    F/U on iron     Subjective:  ANGELITO VERDEJO is a 78 y.o. male present for follow up on   iron deficiency anemia:  He is tolerating BID FS 325 mg. Asymptomatic from iron deficiency. He is taking a stool softener also.   Past Medical History:  Diagnosis Date  . Anemia   . Arthritis   . Colonic polyp   . Concussion 03/05/2018  . Coronary artery disease    stents x 3 (03-2014)  . Diabetes mellitus   . Diverticulosis    throughout entire colon  . HTN (hypertension)   . Hypertriglyceridemia   . Kidney stone   . Lymphocytosis   . Metatarsal fracture left   left 5th proximal phalanx   . OSA (obstructive sleep apnea)    "Mild" by records  . PONV (postoperative nausea and vomiting)    first time, no problem since then  . RBBB 01/2016  . Splenic marginal zone b-cell lymphoma ([place]) 12/18/2011  . Splenomegaly   . STEMI (ST elevation myocardial infarction) [facility])    August 2015  . Subarachnoid hematoma ([place]) 03/12/2018  . Subarachnoid hemorrhage ([place]) 03/05/2018   No Known Allergies Past Surgical History:  Procedure Laterality Date  . CARDIAC  CATHETERIZATION     stents x 3 (03/2014)  . CHOLECYSTECTOMY    . HERNIA REPAIR    . INGUINAL HERNIA REPAIR Right 05/27/2015   Procedure: LAPAROSCOPIC REPAIR RIGHT  INGUINAL HERNIA;  Surgeon: Greer Pickerel, MD;  Location: [facility];  Service: General;  Laterality: Right;  . INSERTION OF MESH Right 05/27/2015   Procedure: INSERTION OF MESH;  Surgeon: Greer Pickerel, MD;  Location: [facility];  Service: General;  Laterality: Right;  . JOINT REPLACEMENT    . LUMBAR DISC SURGERY    . TONSILLECTOMY AND ADENOIDECTOMY    . TOTAL KNEE ARTHROPLASTY Right 07/09/2018  . TOTAL KNEE ARTHROPLASTY Right 07/09/2018   Procedure: RIGHT TOTAL KNEE ARTHROPLASTY;  Surgeon: Garald Balding, MD;  Location: [facility];  Service: Orthopedics;  Laterality: Right;   Family History  Problem Relation Age of Onset  . Kidney disease Sister   . Colon cancer Sister 23  . Colon cancer Brother   . Bone cancer Brother   . Arthritis Mother   . Breast cancer Mother   . Heart disease Mother   . Early death Father   . Arthritis Son   . Esophageal cancer Sister   . Bladder Cancer Sister   . Rectal cancer Neg Hx   . Stomach cancer Neg Hx    Social History  Socioeconomic History  . Marital status: Married    Spouse name: Not on file  . Number of children: 2  . Years of education: Not on file  . Highest education level: Not on file  Occupational History    Comment: used to work as an Proofreader; now retired  Tobacco Use  . Smoking status: Former Smoker    Packs/day: 1.00    Years: 20.00    Pack years: 20.00    Quit date: 08/14/1988    Years since quitting: 30.9  . Smokeless tobacco: Never Used  Substance and Sexual Activity  . Alcohol use: No  . Drug use: No  . Sexual activity: Yes    Birth control/protection: None  Other Topics Concern  . Not on file  Social History Narrative   Married to [place].    HS education. Retired.    Former smoker. No drugs or ETOH.   Wears seatbelt.   Wears dentures.   Smoke detector  in the home. Firearms  In the home.    Feels safe in his relationships.    Social Determinants of Health   Financial Resource Strain:   . Difficulty of Paying Living Expenses: Not on file  Food Insecurity:   . Worried About Charity fundraiser in the Last Year: Not on file  . Ran Out of Food in the Last Year: Not on file  Transportation Needs:   . Lack of Transportation (Medical): Not on file  . Lack of Transportation (Non-Medical): Not on file  Physical Activity:   . Days of Exercise per Week: Not on file  . Minutes of Exercise per Session: Not on file  Stress:   . Feeling of Stress : Not on file  Social Connections:   . Frequency of Communication with Friends and Family: Not on file  . Frequency of Social Gatherings with Friends and Family: Not on file  . Attends Religious Services: Not on file  . Active Member of Clubs or Organizations: Not on file  . Attends Archivist Meetings: Not on file  . Marital Status: Not on file  Intimate Partner Violence:   . Fear of Current or Ex-Partner: Not on file  . Emotionally Abused: Not on file  . Physically Abused: Not on file  . Sexually Abused: Not on file   ROS: Negative, with the exception of above mentioned in HPI  Objective: BP 133/73 (BP Location: Left Arm, Patient Position: Sitting, Cuff Size: Normal)   Pulse 65   Temp (!) 97.5 F (36.4 C) (Temporal)   Resp 17   Ht 6' (1.829 m)   Wt 215 lb (97.5 kg)   SpO2 98%   BMI 29.16 kg/m   Gen: Afebrile. No acute distress.  HENT: AT. [place].  Eyes:Pupils Equal Round Reactive to light, Extraocular movements intact,  Conjunctiva without redness, discharge or icterus. CV: RRR no murmur, no edema Chest: CTAB, no wheeze or crackles Neuro: Alert. Oriented.  Psych: Normal affect, dress and demeanor. Normal speech. Normal thought content and judgment   Assessment/plan: NAHIM SWINEY is a 78 y.o. male present for chronic medical condition follow up.  Iron deficiency anemia:    Asymptomatic. Tolerating FS 325 BID  - recollect iron panel today and CBC - if not improved, would advise he follow with his onc for further eval.  - Pt will be called with results and further plan discussed at that time.     > 15 minutes spent with patient, > 50%  of that time face to face    Electronically signed by: Howard Pouch, [place]

## 2019-08-04 NOTE — Patient Instructions (Signed)

## 2019-08-05 ENCOUNTER — Telehealth: Payer: Self-pay | Admitting: Family Medicine

## 2019-08-05 DIAGNOSIS — D630 Anemia in neoplastic disease: Secondary | ICD-10-CM

## 2019-08-05 DIAGNOSIS — C8307 Small cell B-cell lymphoma, spleen: Secondary | ICD-10-CM

## 2019-08-05 DIAGNOSIS — E611 Iron deficiency: Secondary | ICD-10-CM

## 2019-08-05 LAB — IRON,TIBC AND FERRITIN PANEL
%SAT: 11 % (calc) — ABNORMAL LOW (ref 20–48)
Ferritin: 9 ng/mL — ABNORMAL LOW (ref 24–380)
Iron: 39 ug/dL — ABNORMAL LOW (ref 50–180)
TIBC: 340 mcg/dL (calc) (ref 250–425)

## 2019-08-05 MED ORDER — FERROUS SULFATE 324 (65 FE) MG PO TBEC: 1.0000 | DELAYED_RELEASE_TABLET | Freq: Two times a day (BID) | ORAL | 0 refills | Status: DC

## 2019-08-05 NOTE — Telephone Encounter (Signed)
Please inform patient the following information: Taking the iron supplement twice a day unfortunately did not greatly increase his iron levels.  Iron levels only went from 23 up to 39.  iron saturations 7 up to 11.  Ferritin 8 up to 9.  None of these levels are  close to normal range yet.  I would encourage him to continue the oral iron for now twice a day and I am referring him back to his hematologist/oncologist for further evaluation and they may consider iron transfusion.  He should be hearing from their office within the next week, if he has not,  I would encourage him to call their office and schedule.  I did refill the iron tabs for him.

## 2019-08-05 NOTE — Telephone Encounter (Signed)
Patient advised and voiced understanding.  

## 2019-08-11 ENCOUNTER — Other Ambulatory Visit: Payer: Self-pay

## 2019-08-11 ENCOUNTER — Ambulatory Visit: Payer: Medicare Other | Admitting: Podiatry

## 2019-08-11 ENCOUNTER — Other Ambulatory Visit: Payer: Self-pay | Admitting: Hematology and Oncology

## 2019-08-11 ENCOUNTER — Encounter: Payer: Self-pay | Admitting: Podiatry

## 2019-08-11 DIAGNOSIS — B351 Tinea unguium: Secondary | ICD-10-CM

## 2019-08-11 DIAGNOSIS — D689 Coagulation defect, unspecified: Secondary | ICD-10-CM | POA: Diagnosis not present

## 2019-08-11 DIAGNOSIS — C8307 Small cell B-cell lymphoma, spleen: Secondary | ICD-10-CM

## 2019-08-11 DIAGNOSIS — M79676 Pain in unspecified toe(s): Secondary | ICD-10-CM | POA: Diagnosis not present

## 2019-08-11 DIAGNOSIS — L600 Ingrowing nail: Secondary | ICD-10-CM

## 2019-08-11 DIAGNOSIS — D500 Iron deficiency anemia secondary to blood loss (chronic): Secondary | ICD-10-CM

## 2019-08-11 NOTE — Progress Notes (Signed)
Subjective: 78 y.o. returns the office today for painful, elongated, thickened toenails which he cannot trim himself. Denies any redness or drainage around the nails.  He says the ankle is doing better the injection helped quite a bit.  So this new patient discomfort ankle but overall improved.  He does wear an ankle brace that is on his feet a lot.  Denies any acute changes since last appointment and no new complaints today. Denies any systemic complaints such as fevers, chills, nausea, vomiting.   PCP: Howard Pouch A, DO  Objective: AAO 3, NAD DP/PT pulses palpable, CRT less than 3 seconds Nails hypertrophic, dystrophic, elongated, brittle, discolored 10. There is tenderness overlying the nails 1-5 bilaterally. There is no surrounding erythema or drainage along the nail sites.  Incurvation present to the lateral aspect the right hallux toenail with tenderness.  No drainage or concerning signs of infection.  No open lesions or pre-ulcerative lesions are identified. There is minimal tenderness to palpation on medial aspect of the left ankle joint.  There is no pain or crepitation with ankle joint range of motion. No other areas of tenderness bilateral lower extremities. No overlying edema, erythema, increased warmth. No pain with calf compression, swelling, warmth, erythema.  Assessment: Patient presents with symptomatic onychomycosis/ingrown toenail  Plan: -Treatment options including alternatives, risks, complications were discussed -Nails sharply debrided 10 without complication/bleeding.  Debrided ingrown toenail in the right big nail but any complications or bleeding -Hold off on another steroid injection today as he is doing better. -Discussed daily foot inspection. If there are any changes, to call the office immediately.  -Follow-up in 9 weeks or sooner if any problems are to arise. In the meantime, encouraged to call the office with any questions, concerns, changes  symptoms.  Celesta Gentile, DPM

## 2019-08-12 ENCOUNTER — Telehealth: Payer: Self-pay | Admitting: Hematology and Oncology

## 2019-08-12 NOTE — Telephone Encounter (Signed)
Scheduled appt per 12/28 sch message - pt Is aware of appt date and time

## 2019-08-15 DIAGNOSIS — U071 COVID-19: Secondary | ICD-10-CM | POA: Diagnosis not present

## 2019-08-15 DIAGNOSIS — R05 Cough: Secondary | ICD-10-CM | POA: Diagnosis not present

## 2019-08-18 ENCOUNTER — Telehealth: Payer: Self-pay | Admitting: *Deleted

## 2019-08-18 NOTE — Telephone Encounter (Signed)
Patient called to notify this office he tested positive for COVID on 08/15/2019. Cancelled his appts for 1/7 and 1/14.  Sent scheduling message to have patient rescheduled to 1/21 and 1/28.

## 2019-08-19 ENCOUNTER — Telehealth: Payer: Self-pay | Admitting: Hematology and Oncology

## 2019-08-19 NOTE — Telephone Encounter (Signed)
R/s apt per 1/4 sch message -pt aware of apt date and time

## 2019-08-21 ENCOUNTER — Other Ambulatory Visit: Payer: Medicare Other

## 2019-08-21 ENCOUNTER — Ambulatory Visit: Payer: Medicare Other | Admitting: Hematology and Oncology

## 2019-08-21 ENCOUNTER — Ambulatory Visit: Payer: Medicare Other

## 2019-08-28 ENCOUNTER — Ambulatory Visit: Payer: Medicare Other

## 2019-09-04 ENCOUNTER — Inpatient Hospital Stay: Payer: Medicare Other | Admitting: Hematology and Oncology

## 2019-09-04 ENCOUNTER — Inpatient Hospital Stay: Payer: Medicare Other | Attending: Hematology and Oncology

## 2019-09-04 ENCOUNTER — Inpatient Hospital Stay: Payer: Medicare Other

## 2019-09-04 ENCOUNTER — Encounter: Payer: Self-pay | Admitting: Hematology and Oncology

## 2019-09-04 ENCOUNTER — Other Ambulatory Visit: Payer: Self-pay

## 2019-09-04 VITALS — BP 134/57 | HR 57 | Resp 17

## 2019-09-04 VITALS — BP 157/65 | HR 74 | Temp 98.5°F | Resp 18 | Ht 72.0 in | Wt 216.8 lb

## 2019-09-04 DIAGNOSIS — D500 Iron deficiency anemia secondary to blood loss (chronic): Secondary | ICD-10-CM

## 2019-09-04 DIAGNOSIS — D509 Iron deficiency anemia, unspecified: Secondary | ICD-10-CM | POA: Diagnosis not present

## 2019-09-04 DIAGNOSIS — C8307 Small cell B-cell lymphoma, spleen: Secondary | ICD-10-CM

## 2019-09-04 LAB — CBC WITH DIFFERENTIAL/PLATELET
Abs Immature Granulocytes: 0.02 10*3/uL (ref 0.00–0.07)
Basophils Absolute: 0.1 10*3/uL (ref 0.0–0.1)
Basophils Relative: 0 %
Eosinophils Absolute: 0.2 10*3/uL (ref 0.0–0.5)
Eosinophils Relative: 1 %
HCT: 37.3 % — ABNORMAL LOW (ref 39.0–52.0)
Hemoglobin: 11.4 g/dL — ABNORMAL LOW (ref 13.0–17.0)
Immature Granulocytes: 0 %
Lymphocytes Relative: 78 %
Lymphs Abs: 12.4 10*3/uL — ABNORMAL HIGH (ref 0.7–4.0)
MCH: 25 pg — ABNORMAL LOW (ref 26.0–34.0)
MCHC: 30.6 g/dL (ref 30.0–36.0)
MCV: 81.8 fL (ref 80.0–100.0)
Monocytes Absolute: 0.5 10*3/uL (ref 0.1–1.0)
Monocytes Relative: 3 %
Neutro Abs: 2.9 10*3/uL (ref 1.7–7.7)
Neutrophils Relative %: 18 %
Platelets: 178 10*3/uL (ref 150–400)
RBC: 4.56 MIL/uL (ref 4.22–5.81)
RDW: 19 % — ABNORMAL HIGH (ref 11.5–15.5)
WBC: 15.9 10*3/uL — ABNORMAL HIGH (ref 4.0–10.5)
nRBC: 0 % (ref 0.0–0.2)

## 2019-09-04 MED ORDER — SODIUM CHLORIDE 0.9 % IV SOLN
510.0000 mg | Freq: Once | INTRAVENOUS | Status: AC
  Administered 2019-09-04: 13:00:00 510 mg via INTRAVENOUS
  Filled 2019-09-04: qty 510

## 2019-09-04 MED ORDER — SODIUM CHLORIDE 0.9 % IV SOLN
Freq: Once | INTRAVENOUS | Status: AC
  Filled 2019-09-04: qty 250

## 2019-09-04 NOTE — Assessment & Plan Note (Signed)
The most likely cause of his anemia is due to chronic blood loss/malabsorption syndrome. We discussed some of the risks, benefits, and alternatives of intravenous iron infusions. The patient is symptomatic from anemia and the iron level is critically low. He tolerated oral iron supplement poorly and desires to achieved higher levels of iron faster for adequate hematopoesis. Some of the side-effects to be expected including risks of infusion reactions, phlebitis, headaches, nausea and fatigue.  The patient is willing to proceed. Patient education material was dispensed.  Goal is to keep ferritin level greater than 50 and resolution of anemia  We will proceed with intravenous iron today due to symptoms I recommend him to start oral iron supplement I plan to recheck iron studies again in the few months

## 2019-09-04 NOTE — Patient Instructions (Signed)

## 2019-09-04 NOTE — Assessment & Plan Note (Addendum)
His exam is unremarkable and CBC is stable Previously, I had extensive discussion with the patient and family members Due to lack of symptoms, I recommend observation only He has appointment to come back in a few months for further follow-up  We discussed briefly about Covid vaccination There is no contraindication for him to proceed

## 2019-09-04 NOTE — Progress Notes (Signed)
[facility] OFFICE PROGRESS NOTE  Patient Care Team: [name], DO as PCP - General (Family Medicine) [name], MD (Inactive) as Consulting Physician (Gastroenterology) [name], MD as Consulting Physician (Hematology and Oncology) [name], MD as Referring Physician (Cardiology) [name], MD as Consulting Physician (Neurosurgery) [name], OD as Referring Physician Garald Balding, MD as Consulting Physician (Orthopedic Surgery) Trula Slade, DPM as Consulting Physician (Podiatry)  ASSESSMENT & PLAN:  Splenic marginal zone b-cell lymphoma [facility]) His exam is unremarkable and CBC is stable Previously, I had extensive discussion with the patient and family members Due to lack of symptoms, I recommend observation only He has appointment to come back in a few months for further follow-up  We discussed briefly about Covid vaccination There is no contraindication for him to proceed  IDA (iron deficiency anemia) The most likely cause of his anemia is due to chronic blood loss/malabsorption syndrome. We discussed some of the risks, benefits, and alternatives of intravenous iron infusions. The patient is symptomatic from anemia and the iron level is critically low. He tolerated oral iron supplement poorly and desires to achieved higher levels of iron faster for adequate hematopoesis. Some of the side-effects to be expected including risks of infusion reactions, phlebitis, headaches, nausea and fatigue.  The patient is willing to proceed. Patient education material was dispensed.  Goal is to keep ferritin level greater than 50 and resolution of anemia  We will proceed with intravenous iron today due to symptoms I recommend him to start oral iron supplement I plan to recheck iron studies again in the few months    Orders Placed This Encounter  Procedures  . CBC with Differential/Platelet    Standing Status:   Future    Standing  Expiration Date:   10/08/2020  . Ferritin    Standing Status:   Future    Standing Expiration Date:   10/08/2020  . Iron and TIBC    Standing Status:   Future    Standing Expiration Date:   10/08/2020    All questions were answered. The patient knows to call the clinic with any problems, questions or concerns. The total time spent in the appointment was 20 minutes encounter with patients including review of chart and various tests results, discussions about plan of care and coordination of care plan   [name], MD 09/04/2019 11:27 AM  INTERVAL HISTORY: Please see below for problem oriented charting. He is seen today prior to intravenous iron infusion The patient has been having some leg cramps and fatigue No pica The patient denies any recent signs or symptoms of bleeding such as spontaneous epistaxis, hematuria or hematochezia. He was recently found to have iron deficiency anemia From the lymphoma standpoint, he denies recent lymphadenopathy Appetite is stable  SUMMARY OF ONCOLOGIC HISTORY:  This is a very pleasant gentleman who was found to have abnormal CBC for the past 5 years. In April 2013, flow cytometry detectable monoclonal B-cell population suspicious for splenic/marginal zone lymphoma. Ultrasound confirmed splenomegaly. Due to lack of symptoms he was being observed. In October 2017, he was noted to have microcytic, iron deficiency anemia. He was started on oral iron supplement On 03/05/2018, the patient had an accidental fall.  CT imaging revealed significant splenomegaly On 03/14/2018, he was started on 20 mg of prednisone daily On April 18, 2018, the dose of prednisone was reduced to 10 mg daily, subsequently taper off In December 2019, CT imaging show chronic liver  cirrhosis and splenomegaly without significant lymphadenopathy REVIEW OF SYSTEMS:   Constitutional: Denies fevers, chills or abnormal weight loss Eyes: Denies blurriness of vision Ears, nose, mouth, throat,  and face: Denies mucositis or sore throat Respiratory: Denies cough, dyspnea or wheezes Cardiovascular: Denies palpitation, chest discomfort or lower extremity swelling Gastrointestinal:  Denies nausea, heartburn or change in bowel habits Skin: Denies abnormal skin rashes Lymphatics: Denies new lymphadenopathy or easy bruising Neurological:Denies numbness, tingling or new weaknesses Behavioral/Psych: Mood is stable, no new changes  All other systems were reviewed with the patient and are negative.  I have reviewed the past medical history, past surgical history, social history and family history with the patient and they are unchanged from previous note.  ALLERGIES:  has No Known Allergies.  MEDICATIONS:  Current Outpatient Medications  Medication Sig Dispense Refill  . acetaminophen (TYLENOL) 325 MG tablet Take 2 tablets (650 mg total) by mouth every 6 (six) hours as needed. 100 tablet 0  . amLODipine (NORVASC) 5 MG tablet Take 1 tablet (5 mg total) by mouth daily. 90 tablet 1  . aspirin EC 81 MG tablet Take 81 mg by mouth daily.    Marland Kitchen atorvastatin (LIPITOR) 40 MG tablet Take 1 tablet (40 mg total) by mouth daily. 90 tablet 3  . diclofenac Sodium (VOLTAREN) 1 % GEL APPLY 2 GRAMS TO AFFECTED AREA 4 TIMES A DAY 600 g 1  . lisinopril (ZESTRIL) 40 MG tablet Take 1 tablet (40 mg total) by mouth daily. 90 tablet 1  . metFORMIN (GLUCOPHAGE-XR) 500 MG 24 hr tablet TAKE 1 TABLET BY MOUTH EVERY MONDAY, WEDNESDAY,AND FRIDAY WITH EVENING MEAL 40 tablet 2  . metoprolol tartrate (LOPRESSOR) 25 MG tablet Take 0.5 tablets (12.5 mg total) by mouth 2 (two) times daily. 90 tablet 1  . nitroGLYCERIN (NITROSTAT) 0.4 MG SL tablet Place 1 tablet (0.4 mg total) under the tongue every 5 (five) minutes as needed for chest pain. 10 tablet 0   Current Facility-Administered Medications  Medication Dose Route Frequency Provider Last Rate Last Admin  . 0.9 %  sodium chloride infusion  500 mL Intravenous Once  Armbruster, Carlota Raspberry, MD        PHYSICAL EXAMINATION: ECOG PERFORMANCE STATUS: 1 - Symptomatic but completely ambulatory  Vitals:   09/04/19 1107  BP: (!) 157/65  Pulse: 74  Resp: 18  Temp: 98.5 F (36.9 C)  SpO2: 100%   Filed Weights   09/04/19 1107  Weight: 216 lb 12.8 oz (98.3 kg)    GENERAL:alert, no distress and comfortable SKIN: skin color, texture, turgor are normal, no rashes or significant lesions EYES: normal, Conjunctiva are pink and non-injected, sclera clear OROPHARYNX:no exudate, no erythema and lips, buccal mucosa, and tongue normal  NECK: supple, thyroid normal size, non-tender, without nodularity LYMPH:  no palpable lymphadenopathy in the cervical, axillary or inguinal LUNGS: clear to auscultation and percussion with normal breathing effort HEART: regular rate & rhythm and no murmurs and no lower extremity edema ABDOMEN:abdomen soft, non-tender and normal bowel sounds Musculoskeletal:no cyanosis of digits and no clubbing  NEURO: alert & oriented x 3 with fluent speech, no focal motor/sensory deficits  LABORATORY DATA:  I have reviewed the data as listed    Component Value Date/Time   NA 141 06/05/2019 0851   NA 144 05/31/2015 1404   K 4.1 06/05/2019 0851   K 4.7 05/31/2015 1404   CL 106 06/05/2019 0851   CL 102 10/07/2012 1013   CO2 29 06/05/2019 0851   CO2  29 05/31/2015 1404   GLUCOSE 120 (H) 06/05/2019 0851   GLUCOSE 99 05/31/2015 1404   GLUCOSE 199 (H) 10/07/2012 1013   BUN 18 06/05/2019 0851   BUN 18.3 05/31/2015 1404   CREATININE 0.78 06/05/2019 0851   CREATININE 0.86 03/13/2017 0759   CREATININE 0.9 05/31/2015 1404   CALCIUM 9.1 06/05/2019 0851   CALCIUM 9.2 05/31/2015 1404   PROT 5.9 (L) 06/05/2019 0851   PROT 6.3 (L) 05/31/2015 1404   ALBUMIN 4.2 06/05/2019 0851   ALBUMIN 3.9 05/31/2015 1404   AST 35 06/05/2019 0851   AST 28 05/31/2015 1404   ALT 20 06/05/2019 0851   ALT 23 05/31/2015 1404   ALKPHOS 73 06/05/2019 0851   ALKPHOS  79 05/31/2015 1404   BILITOT 0.5 06/05/2019 0851   BILITOT 0.46 05/31/2015 1404   GFRNONAA >60 09/26/2018 0827   GFRNONAA 85 03/13/2017 0759   GFRAA >60 09/26/2018 0827   GFRAA >89 03/13/2017 0759    No results found for: SPEP, UPEP  Lab Results  Component Value Date   WBC 15.9 (H) 09/04/2019   NEUTROABS PENDING 09/04/2019   HGB 11.4 (L) 09/04/2019   HCT 37.3 (L) 09/04/2019   MCV 81.8 09/04/2019   PLT 178 09/04/2019      Chemistry      Component Value Date/Time   NA 141 06/05/2019 0851   NA 144 05/31/2015 1404   K 4.1 06/05/2019 0851   K 4.7 05/31/2015 1404   CL 106 06/05/2019 0851   CL 102 10/07/2012 1013   CO2 29 06/05/2019 0851   CO2 29 05/31/2015 1404   BUN 18 06/05/2019 0851   BUN 18.3 05/31/2015 1404   CREATININE 0.78 06/05/2019 0851   CREATININE 0.86 03/13/2017 0759   CREATININE 0.9 05/31/2015 1404      Component Value Date/Time   CALCIUM 9.1 06/05/2019 0851   CALCIUM 9.2 05/31/2015 1404   ALKPHOS 73 06/05/2019 0851   ALKPHOS 79 05/31/2015 1404   AST 35 06/05/2019 0851   AST 28 05/31/2015 1404   ALT 20 06/05/2019 0851   ALT 23 05/31/2015 1404   BILITOT 0.5 06/05/2019 0851   BILITOT 0.46 05/31/2015 1404

## 2019-09-11 ENCOUNTER — Other Ambulatory Visit: Payer: Self-pay

## 2019-09-11 ENCOUNTER — Inpatient Hospital Stay: Payer: Medicare Other

## 2019-09-11 VITALS — BP 135/53 | HR 60 | Temp 98.0°F | Resp 18

## 2019-09-11 DIAGNOSIS — D509 Iron deficiency anemia, unspecified: Secondary | ICD-10-CM | POA: Diagnosis not present

## 2019-09-11 DIAGNOSIS — D500 Iron deficiency anemia secondary to blood loss (chronic): Secondary | ICD-10-CM

## 2019-09-11 MED ORDER — SODIUM CHLORIDE 0.9 % IV SOLN
Freq: Once | INTRAVENOUS | Status: AC
  Filled 2019-09-11: qty 250

## 2019-09-11 MED ORDER — SODIUM CHLORIDE 0.9 % IV SOLN
510.0000 mg | Freq: Once | INTRAVENOUS | Status: AC
  Administered 2019-09-11: 510 mg via INTRAVENOUS
  Filled 2019-09-11: qty 510

## 2019-09-11 NOTE — Patient Instructions (Signed)

## 2019-09-16 ENCOUNTER — Ambulatory Visit (INDEPENDENT_AMBULATORY_CARE_PROVIDER_SITE_OTHER): Payer: Medicare Other | Admitting: Family Medicine

## 2019-09-16 ENCOUNTER — Encounter: Payer: Self-pay | Admitting: Family Medicine

## 2019-09-16 ENCOUNTER — Other Ambulatory Visit: Payer: Self-pay

## 2019-09-16 VITALS — BP 138/82 | HR 67 | Temp 98.0°F | Resp 16 | Ht 72.0 in | Wt 216.4 lb

## 2019-09-16 DIAGNOSIS — H6123 Impacted cerumen, bilateral: Secondary | ICD-10-CM | POA: Diagnosis not present

## 2019-09-16 MED ORDER — CARBAMIDE PEROXIDE 6.5 % OT SOLN: 5.0000 [drp] | Freq: Two times a day (BID) | OTIC | 1 refills | Status: DC

## 2019-09-16 NOTE — Progress Notes (Signed)
This visit occurred during the SARS-CoV-2 public health emergency.  Safety protocols were in place, including screening questions prior to the visit, additional usage of staff PPE, and extensive cleaning of exam room while observing appropriate contact time as indicated for disinfecting solutions.    Marcus Taylor , 1940/11/21, 79 y.o., male MRN: [id number] Patient Care Team    Relationship Specialty Notifications Start End  [name], DO PCP - General Family Medicine  01/26/16   [name], MD (Inactive) Consulting Physician Gastroenterology  01/26/16   [name], MD Consulting Physician Hematology and Oncology  01/26/16   [name], MD Referring Physician Cardiology  01/26/16   [name], MD Consulting Physician Neurosurgery  01/27/16   [name], OD Referring Physician   05/03/16    Comment: opthalmology   Garald Balding, MD Consulting Physician Orthopedic Surgery  06/14/18   Trula Slade, DPM Consulting Physician Podiatry  06/14/18     Chief Complaint  Patient presents with  . Ear Pain    Pt states both ears have felt clogged x2 weeks. Using OTC wax drops daily with no relief. Denies drainage, pain, or fever.      Subjective: Pt presents for an OV with complaints of  of bilateral decrease hearing L>R and "clooged ears. He reports he has this in the past and needed ear lavage. He denies fever, chills, nausea or dizziness. He denies ringing of his ears.  He has been using OTC drops - but it has not helped over the last 2 weeks.   Depression screen [facility] 2/9 06/05/2019 06/14/2018 05/20/2018 03/20/2018 03/12/2018  Decreased Interest 0 0 0 0 0  Down, Depressed, Hopeless 0 0 0 0 0  PHQ - 2 Score 0 0 0 0 0  Some recent data might be hidden   No Known Allergies Social History   Social History Narrative   Married to [place].    HS education. Retired.    Former smoker. No drugs or ETOH.   Wears seatbelt.   Wears dentures.   Smoke detector in the home.  Firearms  In the home.    Feels safe in his relationships.    Past Medical History:  Diagnosis Date  . Anemia   . Arthritis   . Colonic polyp   . Concussion 03/05/2018  . Coronary artery disease    stents x 3 (03-2014)  . Diabetes mellitus   . Diverticulosis    throughout entire colon  . HTN (hypertension)   . Hypertriglyceridemia   . Kidney stone   . Lymphocytosis   . Metatarsal fracture left   left 5th proximal phalanx   . OSA (obstructive sleep apnea)    "Mild" by records  . PONV (postoperative nausea and vomiting)    first time, no problem since then  . RBBB 01/2016  . Splenic marginal zone b-cell lymphoma ([place]) 12/18/2011  . Splenomegaly   . STEMI (ST elevation myocardial infarction) [facility])    August 2015  . Subarachnoid hematoma ([place]) 03/12/2018  . Subarachnoid hemorrhage ([place]) 03/05/2018   Past Surgical History:  Procedure Laterality Date  . CARDIAC CATHETERIZATION     stents x 3 (03/2014)  . CHOLECYSTECTOMY    . HERNIA REPAIR    . INGUINAL HERNIA REPAIR Right 05/27/2015   Procedure: LAPAROSCOPIC REPAIR RIGHT  INGUINAL HERNIA;  Surgeon: Greer Pickerel, MD;  Location: [facility];  Service: General;  Laterality: Right;  . INSERTION OF MESH Right 05/27/2015  Procedure: INSERTION OF MESH;  Surgeon: Greer Pickerel, MD;  Location: [facility];  Service: General;  Laterality: Right;  . JOINT REPLACEMENT    . LUMBAR DISC SURGERY    . TONSILLECTOMY AND ADENOIDECTOMY    . TOTAL KNEE ARTHROPLASTY Right 07/09/2018  . TOTAL KNEE ARTHROPLASTY Right 07/09/2018   Procedure: RIGHT TOTAL KNEE ARTHROPLASTY;  Surgeon: Garald Balding, MD;  Location: [facility];  Service: Orthopedics;  Laterality: Right;   Family History  Problem Relation Age of Onset  . Kidney disease Sister   . Colon cancer Sister 62  . Colon cancer Brother   . Bone cancer Brother   . Arthritis Mother   . Breast cancer Mother   . Heart disease Mother   . Early death Father   . Arthritis Son   . Esophageal cancer Sister   .  Bladder Cancer Sister   . Rectal cancer Neg Hx   . Stomach cancer Neg Hx    Allergies as of 09/16/2019   No Known Allergies     Medication List       Accurate as of September 16, 2019 10:28 AM. If you have any questions, ask your nurse or doctor.        acetaminophen 325 MG tablet Commonly known as: Tylenol Take 2 tablets (650 mg total) by mouth every 6 (six) hours as needed.   amLODipine 5 MG tablet Commonly known as: NORVASC Take 1 tablet (5 mg total) by mouth daily.   aspirin EC 81 MG tablet Take 81 mg by mouth daily.   atorvastatin 40 MG tablet Commonly known as: LIPITOR Take 1 tablet (40 mg total) by mouth daily.   diclofenac Sodium 1 % Gel Commonly known as: VOLTAREN APPLY 2 GRAMS TO AFFECTED AREA 4 TIMES A DAY   lisinopril 40 MG tablet Commonly known as: ZESTRIL Take 1 tablet (40 mg total) by mouth daily.   metFORMIN 500 MG 24 hr tablet Commonly known as: GLUCOPHAGE-XR TAKE 1 TABLET BY MOUTH EVERY MONDAY, WEDNESDAY,AND FRIDAY WITH EVENING MEAL   metoprolol tartrate 25 MG tablet Commonly known as: LOPRESSOR Take 0.5 tablets (12.5 mg total) by mouth 2 (two) times daily.   nitroGLYCERIN 0.4 MG SL tablet Commonly known as: NITROSTAT Place 1 tablet (0.4 mg total) under the tongue every 5 (five) minutes as needed for chest pain.       All past medical history, surgical history, allergies, family history, immunizations andmedications were updated in the EMR today and reviewed under the history and medication portions of their EMR.     ROS: Negative, with the exception of above mentioned in HPI   Objective:  BP 138/82 (BP Location: Left Arm, Patient Position: Sitting, Cuff Size: Normal)   Pulse 67   Temp 98 F (36.7 C) (Temporal)   Resp 16   Ht 6' (1.829 m)   Wt 216 lb 6 oz (98.1 kg)   SpO2 95%   BMI 29.35 kg/m  Body mass index is 29.35 kg/m. Gen: Afebrile. No acute distress. Nontoxic in appearance, well developed, well nourished.  HENT: AT. [place].  Bilateral TM visualized unable to be visualized d/t impacted cerumen.  Eyes:Pupils Equal Round Reactive to light, Extraocular movements intact,  Conjunctiva without redness, discharge or icterus.  No exam data present No results found. No results found for this or any previous visit (from the past 24 hour(s)).  Assessment/Plan: ELRIC GLAVIN is a 79 y.o. male present for OV for  Bilateral hearing loss due to cerumen  impaction Procedure: Cerumen disimpaction Water-peroxide solution was applied and gentle ear lavage performed on bilateral ear(s).  There were no complications.  Tympanic membrane was visualized after disimpaction.  Tympanic membrane(s) intact and w/out perforation or erythema.  Auditory canal(s) cleatr.  Patient tolerated procedure well.  Patient reported relief of symptoms after removal of cerumen. - debrox provided for 1-2x mo. Maintenance and BID QD if clogged.  - F/U PRN     Reviewed expectations re: course of current medical issues.  Discussed self-management of symptoms.  Outlined signs and symptoms indicating need for more acute intervention.  Patient verbalized understanding and all questions were answered.  Patient received an After-Visit Summary.    No orders of the defined types were placed in this encounter.    Note is dictated utilizing voice recognition software. Although note has been proof read prior to signing, occasional typographical errors still can be missed. If any questions arise, please do not hesitate to call for verification.   electronically signed by:  Howard Pouch, DO  [place]

## 2019-09-16 NOTE — Patient Instructions (Addendum)
Call and get on the wait list if you have not already.   COVID-19 Vaccine Information can be found at: [email or web address] For questions related to vaccine distribution or appointments, please email [email or web address] or call [phone].  Covid Vaccine appointment go to [email or web address].    Earwax Buildup, Adult The ears produce a substance called earwax that helps keep bacteria out of the ear and protects the skin in the ear canal. Occasionally, earwax can build up in the ear and cause discomfort or hearing loss. What increases the risk? This condition is more likely to develop in people who:  Are male.  Are elderly.  Naturally produce more earwax.  Clean their ears often with cotton swabs.  Use earplugs often.  Use in-ear headphones often.  Wear hearing aids.  Have narrow ear canals.  Have earwax that is overly thick or sticky.  Have eczema.  Are dehydrated.  Have excess hair in the ear canal. What are the signs or symptoms? Symptoms of this condition include:  Reduced or muffled hearing.  A feeling of fullness in the ear or feeling that the ear is plugged.  Fluid coming from the ear.  Ear pain.  Ear itch.  Ringing in the ear.  Coughing.  An obvious piece of earwax that can be seen inside the ear canal. How is this diagnosed? This condition may be diagnosed based on:  Your symptoms.  Your medical history.  An ear exam. During the exam, your health care provider will look into your ear with an instrument called an otoscope. You may have tests, including a hearing test. How is this treated? This condition may be treated by:  Using ear drops to soften the earwax.  Having the earwax removed by a health care provider. The health care provider may: ? Flush the ear with water. ? Use an instrument that has a loop on the end (curette). ? Use a suction device.  Surgery to remove  the wax buildup. This may be done in severe cases. Follow these instructions at home:   Take over-the-counter and prescription medicines only as told by your health care provider.  Do not put any objects, including cotton swabs, into your ear. You can clean the opening of your ear canal with a washcloth or facial tissue.  Follow instructions from your health care provider about cleaning your ears. Do not over-clean your ears.  Drink enough fluid to keep your urine clear or pale yellow. This will help to thin the earwax.  Keep all follow-up visits as told by your health care provider. If earwax builds up in your ears often or if you use hearing aids, consider seeing your health care provider for routine, preventive ear cleanings. Ask your health care provider how often you should schedule your cleanings.  If you have hearing aids, clean them according to instructions from the manufacturer and your health care provider. Contact a health care provider if:  You have ear pain.  You develop a fever.  You have blood, pus, or other fluid coming from your ear.  You have hearing loss.  You have ringing in your ears that does not go away.  Your symptoms do not improve with treatment.  You feel like the room is spinning (vertigo). Summary  Earwax can build up in the ear and cause discomfort or hearing loss.  The most common symptoms of this condition include reduced or muffled hearing and a feeling of fullness in the ear or feeling that the ear is plugged.  This condition may be diagnosed based on your symptoms, your medical history, and an ear exam.  This condition may be treated by using ear drops to soften the earwax or by having the earwax removed by a health care provider.  Do not put any objects, including cotton swabs, into your ear. You can clean the opening of your ear canal with a washcloth or facial tissue. This information is not intended to replace advice given to you by  your health care provider. Make sure you discuss any questions you have with your health care provider. Document Revised: 07/13/2017 Document Reviewed: 10/11/2016 Elsevier Patient Education  2020 [employer].

## 2019-09-17 ENCOUNTER — Ambulatory Visit: Payer: Medicare Other | Admitting: Orthopaedic Surgery

## 2019-09-17 ENCOUNTER — Encounter: Payer: Self-pay | Admitting: Orthopaedic Surgery

## 2019-09-17 VITALS — Ht 72.0 in | Wt 218.0 lb

## 2019-09-17 DIAGNOSIS — M1711 Unilateral primary osteoarthritis, right knee: Secondary | ICD-10-CM | POA: Diagnosis not present

## 2019-09-17 NOTE — Progress Notes (Signed)
Office Visit Note   Patient: Marcus Taylor           Date of Birth: Mar 12, 1941           MRN: [id number] Visit Date: 09/17/2019              Requested by: [name], DO [address] [place],  [place] [address] PCP: [name], DO   Assessment & Plan: Visit Diagnoses:  1. Primary osteoarthritis of right knee     Plan: Nearly 15 months status post primary right total knee replacement doing exceptionally well without any problems.  We will plan to see him back as needed.  He is aware of the need to take antibiotics for any invasive procedure  Follow-Up Instructions: Return if symptoms worsen or fail to improve.   Orders:  No orders of the defined types were placed in this encounter.  No orders of the defined types were placed in this encounter.     Procedures: No procedures performed   Clinical Data: No additional findings.   Subjective: Chief Complaint  Patient presents with  . Right Knee - Follow-up  Patient presents today for follow up on his right knee. He had a right total knee arthroplasty on 07/07/2018. He is doing well and has no complaints.  HPI  Review of Systems   Objective: Vital Signs: Ht 6' (1.829 m)   Wt 218 lb (98.9 kg)   BMI 29.57 kg/m   Physical Exam Constitutional:      Appearance: He is well-developed.  Eyes:     Pupils: Pupils are equal, round, and reactive to light.  Pulmonary:     Effort: Pulmonary effort is normal.  Skin:    General: Skin is warm and dry.  Neurological:     Mental Status: He is alert and oriented to person, place, and time.  Psychiatric:        Behavior: Behavior normal.     Ortho Exam awake alert and oriented x3.  Comfortable sitting full extension right knee without effusion.  The right knee was not hot red warm or swollen.  No opening with a varus or valgus stress.  Flexed to 120 degrees with a goniometer.  No popliteal pain no localized areas of tenderness.  No hip pain with range of motion no  calf pain or distal edema.  Specialty Comments:  No specialty comments available.  Imaging: No results found.   PMFS History: Patient Active Problem List   Diagnosis Date Noted  . Liver cirrhosis ([place]) 07/25/2018  . History of total right knee replacement 07/09/2018  . Overweight (BMI 25.0-29.9) 05/22/2018  . Pancytopenia, acquired ([place]) 04/19/2018  . Easy bruising 04/19/2018  . Primary osteoarthritis of right knee 04/19/2018  . Decreased hearing of both ears 03/20/2018  . Splenomegaly 03/12/2018  . Enlarged prostate without lower urinary tract symptoms (luts) 03/12/2018  . Neck pain 02/19/2018  . IDA (iron deficiency anemia) 08/09/2016  . Thrombocytopenia ([place]) 05/30/2016  . Diabetes mellitus type 2, controlled, with complications ([place]) [id number]  . Right inguinal hernia 02/03/2016  . History of colon polyps 01/27/2016  . History of ST elevation myocardial infarction (STEMI) 01/27/2016  . CAD (coronary artery disease) 01/27/2016  . Hyperlipidemia 01/27/2016  . Anemia in neoplastic disease 05/25/2014  . Splenic marginal zone b-cell lymphoma ([place]) 12/18/2011  . HTN (hypertension)    Past Medical History:  Diagnosis Date  . Anemia   . Arthritis   . Colonic polyp   .  Concussion 03/05/2018  . Coronary artery disease    stents x 3 (03-2014)  . Diabetes mellitus   . Diverticulosis    throughout entire colon  . HTN (hypertension)   . Hypertriglyceridemia   . Kidney stone   . Lymphocytosis   . Metatarsal fracture left   left 5th proximal phalanx   . OSA (obstructive sleep apnea)    "Mild" by records  . PONV (postoperative nausea and vomiting)    first time, no problem since then  . RBBB 01/2016  . Splenic marginal zone b-cell lymphoma ([place]) 12/18/2011  . Splenomegaly   . STEMI (ST elevation myocardial infarction) [facility])    August 2015  . Subarachnoid hematoma ([place]) 03/12/2018  . Subarachnoid hemorrhage ([place]) 03/05/2018    Family History  Problem Relation Age of  Onset  . Kidney disease Sister   . Colon cancer Sister 63  . Colon cancer Brother   . Bone cancer Brother   . Arthritis Mother   . Breast cancer Mother   . Heart disease Mother   . Early death Father   . Arthritis Son   . Esophageal cancer Sister   . Bladder Cancer Sister   . Rectal cancer Neg Hx   . Stomach cancer Neg Hx     Past Surgical History:  Procedure Laterality Date  . CARDIAC CATHETERIZATION     stents x 3 (03/2014)  . CHOLECYSTECTOMY    . HERNIA REPAIR    . INGUINAL HERNIA REPAIR Right 05/27/2015   Procedure: LAPAROSCOPIC REPAIR RIGHT  INGUINAL HERNIA;  Surgeon: Greer Pickerel, MD;  [facility];  Service: General;  Laterality: Right;  . INSERTION OF MESH Right 05/27/2015   Procedure: INSERTION OF MESH;  Surgeon: Greer Pickerel, MD;  [facility];  Service: General;  Laterality: Right;  . JOINT REPLACEMENT    . LUMBAR DISC SURGERY    . TONSILLECTOMY AND ADENOIDECTOMY    . TOTAL KNEE ARTHROPLASTY Right 07/09/2018  . TOTAL KNEE ARTHROPLASTY Right 07/09/2018   Procedure: RIGHT TOTAL KNEE ARTHROPLASTY;  Surgeon: Garald Balding, MD;  Location: [facility];  Service: Orthopedics;  Laterality: Right;   Social History   Occupational History    Comment: used to work as an Proofreader; now retired  Tobacco Use  . Smoking status: Former Smoker    Packs/day: 1.00    Years: 20.00    Pack years: 20.00    Quit date: 08/14/1988    Years since quitting: 31.1  . Smokeless tobacco: Never Used  Substance and Sexual Activity  . Alcohol use: No  . Drug use: No  . Sexual activity: Yes    Birth control/protection: None

## 2019-09-24 ENCOUNTER — Ambulatory Visit: Payer: Medicare Other | Admitting: Orthopaedic Surgery

## 2019-10-03 ENCOUNTER — Ambulatory Visit: Payer: Self-pay

## 2019-10-05 ENCOUNTER — Ambulatory Visit: Payer: Medicare Other | Attending: Internal Medicine

## 2019-10-05 DIAGNOSIS — Z23 Encounter for immunization: Secondary | ICD-10-CM

## 2019-10-05 NOTE — Progress Notes (Signed)
   Covid-19 Vaccination Clinic  Name:  Marcus Taylor    MRN: [id number] DOB: 01/13/1941  10/05/2019  [name] was observed post Covid-19 immunization for 15 minutes without incidence. He was provided with Vaccine Information Sheet and instruction to access the V-Safe system.   [name] was instructed to call 911 with any severe reactions post vaccine: Marland Kitchen Difficulty breathing  . Swelling of your face and throat  . A fast heartbeat  . A bad rash all over your body  . Dizziness and weakness    Immunizations Administered    Name Date Dose VIS Date Route   Pfizer COVID-19 Vaccine 10/05/2019 12:52 PM 0.3 mL 07/25/2019 Intramuscular   Manufacturer: [place]   Lot: [id number]   [place]: [id number]

## 2019-10-13 ENCOUNTER — Ambulatory Visit: Payer: Medicare Other | Admitting: Podiatry

## 2019-10-13 ENCOUNTER — Other Ambulatory Visit: Payer: Self-pay

## 2019-10-13 ENCOUNTER — Encounter: Payer: Self-pay | Admitting: Podiatry

## 2019-10-13 VITALS — Temp 97.9°F

## 2019-10-13 DIAGNOSIS — M79676 Pain in unspecified toe(s): Secondary | ICD-10-CM

## 2019-10-13 DIAGNOSIS — L600 Ingrowing nail: Secondary | ICD-10-CM | POA: Diagnosis not present

## 2019-10-13 DIAGNOSIS — B351 Tinea unguium: Secondary | ICD-10-CM | POA: Diagnosis not present

## 2019-10-13 DIAGNOSIS — D689 Coagulation defect, unspecified: Secondary | ICD-10-CM

## 2019-10-13 MED ORDER — CEPHALEXIN 500 MG PO CAPS: 500.0000 mg | ORAL_CAPSULE | Freq: Three times a day (TID) | ORAL | 0 refills | Status: DC

## 2019-10-13 NOTE — Patient Instructions (Addendum)

## 2019-10-20 NOTE — Progress Notes (Signed)
Subjective: 79 y.o. returns the office today for painful, elongated, thickened toenails which he cannot trim himself. Denies any redness or drainage around the nails.  He also states that the right big toenail pointing the lateral aspect become ingrown and painful.  Currently no drainage or pus.  His ankle is been doing well he has no concerns.  Denies any acute changes since last appointment and no new complaints today. Denies any systemic complaints such as fevers, chills, nausea, vomiting.   PCP: Howard Pouch A, DO  Objective: AAO 3, NAD DP/PT pulses palpable, CRT less than 3 seconds Nails hypertrophic, dystrophic, elongated, brittle, discolored 10. There is tenderness overlying the nails 1-5 bilaterally.  Incurvation present to lateral aspect of right hallux toenail with localized edema but there is no significant erythema there is no drainage or pus or ascending cellulitis.  No other areas of tenderness bilateral lower extremities. No overlying edema, erythema, increased warmth. No pain with calf compression, swelling, warmth, erythema.  Assessment: Patient presents with symptomatic onychomycosis/ingrown toenail  Plan: -Treatment options including alternatives, risks, complications were discussed -Nails sharply debrided 9 without complication/bleeding.   -At this time, the patient is requesting partial nail removal with chemical matricectomy to the symptomatic portion of the nail. Risks and complications were discussed with the patient for which they understand and written consent was obtained. Under sterile conditions a total of 3 mL of a mixture of 2% lidocaine plain and 0.5% Marcaine plain was infiltrated in a hallux block fashion. Once anesthetized, the skin was prepped in sterile fashion. A tourniquet was then applied. Next the lateral aspect of hallux nail border was then sharply excised making sure to remove the entire offending nail border. Once the nails were ensured to be removed  area was debrided and the underlying skin was intact. There is no purulence identified in the procedure. Next phenol was then applied under standard conditions and copiously irrigated. Silvadene was applied. A dry sterile dressing was applied. After application of the dressing the tourniquet was removed and there is found to be an immediate capillary refill time to the digit. The patient tolerated the procedure well any complications. Post procedure instructions were discussed the patient for which he verbally understood. Follow-up in one week for nail check or sooner if any problems are to arise. Discussed signs/symptoms of infection and directed to call the office immediately should any occur or go directly to the emergency room. In the meantime, encouraged to call the office with any questions, concerns, changes symptoms. -Discussed daily foot inspection. If there are any changes, to call the office immediately.  -Follow-up in 9 weeks or sooner if any problems are to arise. In the meantime, encouraged to call the office with any questions, concerns, changes symptoms.  Celesta Gentile, DPM

## 2019-10-21 ENCOUNTER — Encounter: Payer: Self-pay | Admitting: Podiatry

## 2019-10-21 ENCOUNTER — Ambulatory Visit: Payer: Medicare Other | Admitting: Podiatry

## 2019-10-21 ENCOUNTER — Other Ambulatory Visit: Payer: Self-pay

## 2019-10-21 DIAGNOSIS — L600 Ingrowing nail: Secondary | ICD-10-CM

## 2019-10-21 HISTORY — DX: Ingrowing nail: L60.0

## 2019-10-21 NOTE — Progress Notes (Signed)
Subjective: Marcus Taylor is a 79 y.o.  male returns to office today for follow up evaluation after having Right Hallux lateral partial nail avulsion performed. Patient has been soaking using epsom salts and applying topical antibiotic covered with bandaid daily. He is not having any issues. No pain, swelling, drainage or other signs of infectino. Patient denies fevers, chills, nausea, vomiting. Denies any calf pain, chest pain, SOB.   Objective:  Vitals: Reviewed  General: Well developed, nourished, in no acute distress, alert and oriented x3   Dermatology: Skin is warm, dry and supple bilateral. Left hallux nail border appears to be clean, dry, with surrounding scab. There is no surrounding erythema, edema, drainage/purulence. The remaining nails appear unremarkable at this time. There are no other lesions or other signs of infection present.  Neurovascular status: Intact. No lower extremity swelling; No pain with calf compression bilateral.  Musculoskeletal: No tenderness to palpation of the lateral hallux nail fold. Muscular strength within normal limits bilateral.   Assesement and Plan: S/p partial nail avulsion, doing well.   -Continue soaking in epsom salts twice a day followed by antibiotic ointment and a band-aid. Can leave uncovered at night. Continue this until completely healed.  -If the area has not healed in 2 weeks, call the office for follow-up appointment, or sooner if any problems arise.  -Monitor for any signs/symptoms of infection. Call the office immediately if any occur or go directly to the emergency room. Call with any questions/concerns.  Celesta Gentile, DPM

## 2019-10-29 ENCOUNTER — Ambulatory Visit: Payer: Medicare Other | Attending: Internal Medicine

## 2019-10-29 DIAGNOSIS — Z23 Encounter for immunization: Secondary | ICD-10-CM

## 2019-10-29 NOTE — Progress Notes (Signed)
   Covid-19 Vaccination Clinic  Name:  Marcus Taylor    MRN: [id number] DOB: 1941-04-28  10/29/2019  [name] was observed post Covid-19 immunization for 15 minutes without incident. He was provided with Vaccine Information Sheet and instruction to access the V-Safe system.   [name] was instructed to call 911 with any severe reactions post vaccine: Marland Kitchen Difficulty breathing  . Swelling of face and throat  . A fast heartbeat  . A bad rash all over body  . Dizziness and weakness   Immunizations Administered    Name Date Dose VIS Date Route   Pfizer COVID-19 Vaccine 10/29/2019  9:07 AM 0.3 mL 07/25/2019 Intramuscular   Manufacturer: [place]   Lot: [id number]   [place]: [id number]

## 2019-11-10 LAB — HEMOGLOBIN A1C: Hemoglobin A1C: 5.8

## 2019-12-10 ENCOUNTER — Encounter: Payer: Self-pay | Admitting: Family Medicine

## 2019-12-16 ENCOUNTER — Other Ambulatory Visit: Payer: Self-pay | Admitting: Family Medicine

## 2019-12-16 DIAGNOSIS — I10 Essential (primary) hypertension: Secondary | ICD-10-CM

## 2019-12-18 ENCOUNTER — Encounter: Payer: Self-pay | Admitting: Family Medicine

## 2019-12-18 ENCOUNTER — Other Ambulatory Visit: Payer: Self-pay

## 2019-12-18 ENCOUNTER — Ambulatory Visit (INDEPENDENT_AMBULATORY_CARE_PROVIDER_SITE_OTHER): Payer: Medicare Other | Admitting: Family Medicine

## 2019-12-18 VITALS — BP 146/82 | HR 58 | Temp 97.7°F | Resp 17 | Ht 72.0 in | Wt 214.5 lb

## 2019-12-18 DIAGNOSIS — D630 Anemia in neoplastic disease: Secondary | ICD-10-CM

## 2019-12-18 DIAGNOSIS — D61818 Other pancytopenia: Secondary | ICD-10-CM

## 2019-12-18 DIAGNOSIS — E785 Hyperlipidemia, unspecified: Secondary | ICD-10-CM

## 2019-12-18 DIAGNOSIS — D696 Thrombocytopenia, unspecified: Secondary | ICD-10-CM | POA: Diagnosis not present

## 2019-12-18 DIAGNOSIS — I10 Essential (primary) hypertension: Secondary | ICD-10-CM

## 2019-12-18 DIAGNOSIS — D500 Iron deficiency anemia secondary to blood loss (chronic): Secondary | ICD-10-CM

## 2019-12-18 DIAGNOSIS — M5412 Radiculopathy, cervical region: Secondary | ICD-10-CM

## 2019-12-18 DIAGNOSIS — E118 Type 2 diabetes mellitus with unspecified complications: Secondary | ICD-10-CM | POA: Diagnosis not present

## 2019-12-18 DIAGNOSIS — C8307 Small cell B-cell lymphoma, spleen: Secondary | ICD-10-CM

## 2019-12-18 LAB — LIPID PANEL
Cholesterol: 92 mg/dL (ref 0–200)
HDL: 29.9 mg/dL — ABNORMAL LOW (ref >39.00)
LDL Cholesterol: 44 mg/dL (ref 0–99)
NonHDL: 62.33
Total CHOL/HDL Ratio: 3
Triglycerides: 94 mg/dL (ref 0.0–149.0)
VLDL: 18.8 mg/dL (ref 0.0–40.0)

## 2019-12-18 LAB — CBC WITH DIFFERENTIAL/PLATELET
Basophils Absolute: 0.1 10*3/uL (ref 0.0–0.1)
Basophils Relative: 0.4 % (ref 0.0–3.0)
Eosinophils Absolute: 0.1 10*3/uL (ref 0.0–0.7)
Eosinophils Relative: 0.8 % (ref 0.0–5.0)
HCT: 38.6 % — ABNORMAL LOW (ref 39.0–52.0)
Hemoglobin: 12.7 g/dL — ABNORMAL LOW (ref 13.0–17.0)
Lymphocytes Relative: 80.2 % — ABNORMAL HIGH (ref 12.0–46.0)
Lymphs Abs: 12.8 10*3/uL — ABNORMAL HIGH (ref 0.7–4.0)
MCHC: 32.8 g/dL (ref 30.0–36.0)
MCV: 87.3 fl (ref 78.0–100.0)
Monocytes Absolute: 0.5 10*3/uL (ref 0.1–1.0)
Monocytes Relative: 3.3 % (ref 3.0–12.0)
Neutro Abs: 2.4 10*3/uL (ref 1.4–7.7)
Neutrophils Relative %: 15.3 % — ABNORMAL LOW (ref 43.0–77.0)
Platelets: 173 10*3/uL (ref 150.0–400.0)
RBC: 4.42 Mil/uL (ref 4.22–5.81)
RDW: 17.6 % — ABNORMAL HIGH (ref 11.5–15.5)
WBC: 15.9 10*3/uL — ABNORMAL HIGH (ref 4.0–10.5)

## 2019-12-18 LAB — POCT GLYCOSYLATED HEMOGLOBIN (HGB A1C)
HbA1c POC (<> result, manual entry): 6 % (ref 4.0–5.6)
HbA1c, POC (controlled diabetic range): 6 % (ref 0.0–7.0)
HbA1c, POC (prediabetic range): 6 % (ref 5.7–6.4)
Hemoglobin A1C: 6 % — AB (ref 4.0–5.6)

## 2019-12-18 LAB — COMPREHENSIVE METABOLIC PANEL
ALT: 23 U/L (ref 0–53)
AST: 34 U/L (ref 0–37)
Albumin: 4.2 g/dL (ref 3.5–5.2)
Alkaline Phosphatase: 76 U/L (ref 39–117)
BUN: 19 mg/dL (ref 6–23)
CO2: 29 mEq/L (ref 19–32)
Calcium: 8.8 mg/dL (ref 8.4–10.5)
Chloride: 107 mEq/L (ref 96–112)
Creatinine, Ser: 0.75 mg/dL (ref 0.40–1.50)
GFR: 100.58 mL/min (ref >60.00)
Glucose, Bld: 138 mg/dL — ABNORMAL HIGH (ref 70–99)
Potassium: 4.3 mEq/L (ref 3.5–5.1)
Sodium: 141 mEq/L (ref 135–145)
Total Bilirubin: 0.5 mg/dL (ref 0.2–1.2)
Total Protein: 6 g/dL (ref 6.0–8.3)

## 2019-12-18 LAB — TSH: TSH: 1.39 u[IU]/mL (ref 0.35–4.50)

## 2019-12-18 MED ORDER — AMLODIPINE BESYLATE 5 MG PO TABS: 7.5000 mg | ORAL_TABLET | Freq: Every day | ORAL | 1 refills | Status: DC

## 2019-12-18 MED ORDER — METFORMIN HCL ER 500 MG PO TB24: ORAL_TABLET | ORAL | 2 refills | Status: DC

## 2019-12-18 MED ORDER — GABAPENTIN 100 MG PO CAPS: 100.0000 mg | ORAL_CAPSULE | Freq: Every day | ORAL | 1 refills | Status: DC

## 2019-12-18 MED ORDER — LISINOPRIL 40 MG PO TABS: 40.0000 mg | ORAL_TABLET | Freq: Every day | ORAL | 1 refills | Status: DC

## 2019-12-18 MED ORDER — ATORVASTATIN CALCIUM 40 MG PO TABS: 40.0000 mg | ORAL_TABLET | Freq: Every day | ORAL | 3 refills | Status: DC

## 2019-12-18 NOTE — Patient Instructions (Signed)
Increase amlodipine to 1.5 tabs a day.  Start gabapentin 1-2 tabs before bed. Start 1 tab and then taper up every 5 nights ONLY IF NEEDED, to 3 tabs before bed.   I have refilled your medications and we will call you with lab results. Let your other doctor know we collected your cbc and iron panel today and I will forward it to them.     Follow up 5.5 months on chronic conditions.

## 2019-12-18 NOTE — Progress Notes (Signed)
Patient ID: Marcus Taylor, male   DOB: 1941-08-13, 79 y.o.   MRN: [id number]      Patient ID: Marcus Taylor, male  DOB: 1940/08/16, 79 y.o.   MRN: [id number] Patient Care Team    Relationship Specialty Notifications Start End  [name], DO PCP - General Family Medicine  01/26/16   [name], MD (Inactive) Consulting Physician Gastroenterology  01/26/16   [name], MD Consulting Physician Hematology and Oncology  01/26/16   [name], MD Referring Physician Cardiology  01/26/16   [name], MD Consulting Physician Neurosurgery  01/27/16   [name], OD Referring Physician   05/03/16    Comment: opthalmology   Garald Balding, MD Consulting Physician Orthopedic Surgery  06/14/18   Trula Slade, DPM Consulting Physician Podiatry  06/14/18    Chief Complaint  Patient presents with  . Hypertension    Pt needs refills. Took BP meds about 10 mins ago. Needs Eye exam this year.   . Diabetes    Subjective: Marcus Taylor is a 79 y.o. male present for follow up on  Hypertension/H/o MI/CAD/stentsx3/iron deficiency anemia: Pt routinely follows with cardiology at [facility], Dr. [name]. Pt has had stent placement and MI in 2015. He is prescribed Lipitor 40 mg, lisinopril 40 mg, lopressor 12.5 (BID), amlodipine 5 mg qd. He takes a daily baby asa. He is prescribed nitro, he has not needed it.Plavix recently stopped (> 28yrsince stent) Blood pressures ranges at homeare  mildly elevated.Patient denies chest pain, shortness of breath, dizziness or lower extremity edema.  BMP: 09/26/2018 within normal limits with the exception of very mild lower protein at 6.4 CBC:12/24/2018 for a mild elevation in white blood cell 12.6 which is chronic, hemoglobin 11.6, hematocrit 37.9 (followed by oncology) TMMH:[id number] normal limits Lipid: 01/2019  Diet: low sodium  Diabetes type 2: Patient reportscomplianceon metformin 500 mg Monday Wednesday and Friday. Patient denies  dizziness, hyperglycemic or hypoglycemic events. Patient denies numbness, tingling in the extremities or nonhealing wounds of feet.  A1c: 6.2 --> 6.0>5.7>> 5.9>5.8>6.0 - Opthalmology: 04/15/2018 [place] Dr. BOswaldo Conroy Records requested - pna series UTD/completed  - flu shot up-to-date 2020 - foot exam completed 09/23/2018 - urine micro on ACei  iron deficiency anemia:   Iron transfused 09/04/2019.  Upcoming heme-onc appointment next week  Cervical radiculopathy: Patient reports he is having more cervical radiculopathy along his right upper neck into his upper shoulder, worsened when laying in bed.  He also has some mild neuropathy in his feet which have been chronic and not worsening. Past Medical History:  Diagnosis Date  . Anemia   . Arthritis   . Colonic polyp   . Concussion 03/05/2018  . Coronary artery disease    stents x 3 (03-2014)  . Diabetes mellitus   . Diverticulosis    throughout entire colon  . HTN (hypertension)   . Hypertriglyceridemia   . Kidney stone   . Lymphocytosis   . Metatarsal fracture left   left 5th proximal phalanx   . OSA (obstructive sleep apnea)    "Mild" by records  . PONV (postoperative nausea and vomiting)    first time, no problem since then  . RBBB 01/2016  . Splenic marginal zone b-cell lymphoma ([facility] 12/18/2011  . Splenomegaly   . STEMI (ST elevation myocardial infarction) ([facility]    August 2015  . Sub[place]noid hematoma ([facility] 03/12/2018  . [place]oid hemorrhage [place] 03/05/2018   No Known Allergies Past Surgical History:  Procedure Laterality Date  . CARDIAC CATHETERIZATION     stents x 3 (03/2014)  . CHOLECYSTECTOMY    . HERNIA REPAIR    . INGUINAL HERNIA REPAIR Right 05/27/2015   Procedure: LAPAROSCOPIC REPAIR RIGHT  INGUINAL HERNIA;  Surgeon: Greer Pickerel, MD;  Location: [facility];  Service: General;  Laterality: Right;  . INSERTION OF MESH Right 05/27/2015   Procedure: INSERTION OF MESH;  Surgeon: Greer Pickerel, MD;  Location: [facility];   Service: General;  Laterality: Right;  . JOINT REPLACEMENT    . LUMBAR DISC SURGERY    . TONSILLECTOMY AND ADENOIDECTOMY    . TOTAL KNEE ARTHROPLASTY Right 07/09/2018  . TOTAL KNEE ARTHROPLASTY Right 07/09/2018   Procedure: RIGHT TOTAL KNEE ARTHROPLASTY;  Surgeon: Garald Balding, MD;  Location: [facility];  Service: Orthopedics;  Laterality: Right;   Family History  Problem Relation Age of Onset  . Kidney disease Sister   . Colon cancer Sister 4  . Colon cancer Brother   . Bone cancer Brother   . Arthritis Mother   . Breast cancer Mother   . Heart disease Mother   . Early death Father   . Arthritis Son   . Esophageal cancer Sister   . Bladder Cancer Sister   . Rectal cancer Neg Hx   . Stomach cancer Neg Hx    Social History   Socioeconomic History  . Marital status: Married    Spouse name: Not on file  . Number of children: 2  . Years of education: Not on file  . Highest education level: Not on file  Occupational History    Comment: used to work as an Proofreader; now retired  Tobacco Use  . Smoking status: Former Smoker    Packs/day: 1.00    Years: 20.00    Pack years: 20.00    Quit date: 08/14/1988    Years since quitting: 31.3  . Smokeless tobacco: Never Used  Substance and Sexual Activity  . Alcohol use: No  . Drug use: No  . Sexual activity: Yes    Birth control/protection: None  Other Topics Concern  . Not on file  Social History Narrative   Married to [name].    HS education. Retired.    Former smoker. No drugs or ETOH.   Wears seatbelt.   Wears dentures.   Smoke detector in the home. Firearms  In the home.    Feels safe in his relationships.    Social Determinants of Health   Financial Resource Strain:   . Difficulty of Paying Living Expenses:   Food Insecurity:   . Worried About Charity fundraiser in the Last Year:   . Arboriculturist in the Last Year:   Transportation Needs:   . Film/video editor (Medical):   Marland Kitchen Lack of  Transportation (Non-Medical):   Physical Activity:   . Days of Exercise per Week:   . Minutes of Exercise per Session:   Stress:   . Feeling of Stress :   Social Connections:   . Frequency of Communication with Friends and Family:   . Frequency of Social Gatherings with Friends and Family:   . Attends Religious Services:   . Active Member of Clubs or Organizations:   . Attends Archivist Meetings:   Marland Kitchen Marital Status:   Intimate Partner Violence:   . Fear of Current or Ex-Partner:   . Emotionally Abused:   Marland Kitchen Physically Abused:   . Sexually Abused:  ROS: Negative, with the exception of above mentioned in HPI  Objective: BP (!) 146/82 (BP Location: Left Arm, Patient Position: Sitting, Cuff Size: Normal)   Pulse (!) 58   Temp 97.7 F (36.5 C) (Temporal)   Resp 17   Ht 6' (1.829 m)   Wt 214 lb 8 oz (97.3 kg)   SpO2 97%   BMI 29.09 kg/m   Gen: Afebrile. No acute distress.  Nontoxic.  Pleasant male. HENT: AT. [place].  Eyes:Pupils Equal Round Reactive to light, Extraocular movements intact,  Conjunctiva without redness, discharge or icterus. CV: RRR, no edema Chest: CTAB, no wheeze or crackles Abd: Soft.  BS present.  Skin: no rashes, purpura or petechiae.  Neuro:  Normal gait. PERLA. EOMi. Alert. Oriented x3  Psych: Normal affect, dress and demeanor. Normal speech. Normal thought content and judgment.  Assessment/plan: Marcus Taylor is a 79 y.o. male present for chronic medical condition follow up.  Iron deficiency anemia:   iron transfusion 09/04/2019 -Iron panel collected today.  Encouraged him to let his heme/onc team know that we went ahead and collected today so he does not have to have recollection next week.  Controlled type 2 diabetes mellitus with complication, without long-term current use of insulin ([place]) - Continue metformin 500 mg Monday, Wednesday and Friday only.  A1c: 6.2 --> 6.0>5.7>> 5.9>5.8>6.0 today - Opthalmology: 04/15/2018 [place] [address].  [name]. Records requested - pna series UTD/completed  - flu shot up-to-date 2020 - foot exam completed 09/23/2018 - urine micro on ACe  Essential hypertension/Coronary artery disease involving native coronary artery of native heart without angina pectoris/History of ST elevation myocardial infarction (STEMI)/ -Pressures have been mildly elevated at his appointments and at home. - cbc, cmp, lipid, SH collected today - Follows yearly with cardiology.  - Continue ASA -Continuelopressor 12.5 twice daily -Continue lisinopril 40 mg daily -Increase amlodipine to 1.5 tabs daily (7.5 mg total dose)  -Continue statin 1/half tab alternation every other day - Low sodium diet encouraged. - F/U q 6 months  Splenic Margicnal Zone B-cell lymphoma/thrombocytopenia: - continue to follow up with heme/onc.  -now off steroid -Low-sodium diet, routine exercise - continue routine follow ups per scheduled.  Cervical radiculopathy: -He has had mild neuropathy versus feet for some time that has not worsened.  However now he has some mild cervical neuropathy that bothers him especially at night.  Discussed different options with him and he would like to try start of gabapentin. -He was instructed on tapering 100-300 mg of gabapentin nightly.  Orders Placed This Encounter  Procedures  . Iron, TIBC and Ferritin Panel  . CBC w/Diff  . TSH  . Lipid panel  . Comp Met (CMET)  . POCT glycosylated hemoglobin (Hb A1C)   Meds ordered this encounter  Medications  . amLODipine (NORVASC) 5 MG tablet    Sig: Take 1.5 tablets (7.5 mg total) by mouth daily.    Dispense:  135 tablet    Refill:  1  . atorvastatin (LIPITOR) 40 MG tablet    Sig: Take 1 tablet (40 mg total) by mouth daily.    Dispense:  90 tablet    Refill:  3    DC prior scripts for lipitor  . lisinopril (ZESTRIL) 40 MG tablet    Sig: Take 1 tablet (40 mg total) by mouth daily.    Dispense:  90 tablet    Refill:  1  . metFORMIN  (GLUCOPHAGE-XR) 500 MG 24 hr tablet    Sig: TAKE 1  TABLET BY MOUTH EVERY MONDAY, WEDNESDAY,AND FRIDAY WITH EVENING MEAL    Dispense:  40 tablet    Refill:  2    DX Code Needed  .  . gabapentin (NEURONTIN) 100 MG capsule    Sig: Take 1-3 capsules (100-300 mg total) by mouth at bedtime.    Dispense:  90 capsule    Refill:  1   Referral Orders  No referral(s) requested today    Electronically signed by: Howard Pouch, [place]

## 2019-12-19 ENCOUNTER — Other Ambulatory Visit: Payer: Self-pay | Admitting: Family Medicine

## 2019-12-19 ENCOUNTER — Telehealth: Payer: Self-pay | Admitting: Family Medicine

## 2019-12-19 DIAGNOSIS — I10 Essential (primary) hypertension: Secondary | ICD-10-CM

## 2019-12-19 LAB — IRON,TIBC AND FERRITIN PANEL
%SAT: 17 % (calc) — ABNORMAL LOW (ref 20–48)
Ferritin: 34 ng/mL (ref 24–380)
Iron: 48 ug/dL — ABNORMAL LOW (ref 50–180)
TIBC: 285 mcg/dL (calc) (ref 250–425)

## 2019-12-19 MED ORDER — METOPROLOL TARTRATE 25 MG PO TABS: 12.5000 mg | ORAL_TABLET | Freq: Two times a day (BID) | ORAL | 1 refills | Status: DC

## 2019-12-19 NOTE — Telephone Encounter (Signed)
Pt was called and given lab results/instructions

## 2019-12-19 NOTE — Telephone Encounter (Signed)
Please inform patient the following information: His liver, kidney and thyroid functions are normal. His blood cell counts are stable in comparison to his prior counts in January. His iron panel is mildly improved from last collection although his iron levels are still low at 48, his ferritin/iron stores are now in the normal range at 34.  I will forward these results to his hematology team. His cholesterol panel looks great.  I would recommend he decrease his cholesterol medicine by taking 1 tab alternating by half tab the following day.      Example: Monday Wednesday Friday take 1 tab and  Tuesday Thursday Saturday Sunday take 1/2 tab.

## 2019-12-22 DIAGNOSIS — M5412 Radiculopathy, cervical region: Secondary | ICD-10-CM

## 2019-12-22 HISTORY — DX: Radiculopathy, cervical region: M54.12

## 2019-12-23 ENCOUNTER — Ambulatory Visit: Payer: Medicare Other | Admitting: Podiatry

## 2019-12-23 ENCOUNTER — Other Ambulatory Visit: Payer: Self-pay

## 2019-12-23 DIAGNOSIS — D689 Coagulation defect, unspecified: Secondary | ICD-10-CM

## 2019-12-23 DIAGNOSIS — B351 Tinea unguium: Secondary | ICD-10-CM | POA: Diagnosis not present

## 2019-12-23 DIAGNOSIS — M79676 Pain in unspecified toe(s): Secondary | ICD-10-CM | POA: Diagnosis not present

## 2019-12-23 NOTE — Progress Notes (Signed)
Subjective: 79 y.o. returns the office today for painful, elongated, thickened toenails which he cannot trim himself. Denies any redness or drainage around the nails. Ingrown toenail has been doing well and not causing any pain. Denies any acute changes since last appointment and no new complaints today. Denies any systemic complaints such as fevers, chills, nausea, vomiting.   PCP: Howard Pouch A, DO  Objective: AAO 3, NAD DP/PT pulses palpable, CRT less than 3 seconds  Nails hypertrophic, dystrophic, elongated, brittle, discolored 10. There is tenderness overlying the nails 1-5 bilaterally. There is no surrounding erythema or drainage along the nail sites. No open lesions or pre-ulcerative lesions are identified. No other areas of tenderness bilateral lower extremities. No overlying edema, erythema, increased warmth. No pain with calf compression, swelling, warmth, erythema.  Assessment: Patient presents with symptomatic onychomycosis  Plan: -Treatment options including alternatives, risks, complications were discussed -Nails sharply debrided 10 without complication/bleeding. -Discussed daily foot inspection. If there are any changes, to call the office immediately.  -Follow-up in 3 months or sooner if any problems are to arise. In the meantime, encouraged to call the office with any questions, concerns, changes symptoms.  Celesta Gentile, DPM

## 2019-12-25 ENCOUNTER — Inpatient Hospital Stay: Payer: Medicare Other | Attending: Hematology and Oncology | Admitting: Hematology and Oncology

## 2019-12-25 ENCOUNTER — Inpatient Hospital Stay: Payer: Medicare Other

## 2019-12-25 ENCOUNTER — Other Ambulatory Visit: Payer: Self-pay

## 2019-12-25 ENCOUNTER — Encounter: Payer: Self-pay | Admitting: Hematology and Oncology

## 2019-12-25 ENCOUNTER — Telehealth: Payer: Self-pay

## 2019-12-25 VITALS — BP 156/59 | HR 62 | Temp 98.2°F | Resp 18 | Ht 72.0 in | Wt 215.2 lb

## 2019-12-25 DIAGNOSIS — C8307 Small cell B-cell lymphoma, spleen: Secondary | ICD-10-CM

## 2019-12-25 DIAGNOSIS — D500 Iron deficiency anemia secondary to blood loss (chronic): Secondary | ICD-10-CM

## 2019-12-25 DIAGNOSIS — C884 Extranodal marginal zone B-cell lymphoma of mucosa-associated lymphoid tissue [MALT-lymphoma]: Secondary | ICD-10-CM | POA: Diagnosis not present

## 2019-12-25 DIAGNOSIS — Z7984 Long term (current) use of oral hypoglycemic drugs: Secondary | ICD-10-CM | POA: Diagnosis not present

## 2019-12-25 DIAGNOSIS — K7460 Unspecified cirrhosis of liver: Secondary | ICD-10-CM | POA: Insufficient documentation

## 2019-12-25 DIAGNOSIS — Z79899 Other long term (current) drug therapy: Secondary | ICD-10-CM | POA: Diagnosis not present

## 2019-12-25 DIAGNOSIS — D509 Iron deficiency anemia, unspecified: Secondary | ICD-10-CM | POA: Insufficient documentation

## 2019-12-25 DIAGNOSIS — D630 Anemia in neoplastic disease: Secondary | ICD-10-CM

## 2019-12-25 LAB — CBC WITH DIFFERENTIAL/PLATELET
Abs Immature Granulocytes: 0.03 10*3/uL (ref 0.00–0.07)
Basophils Absolute: 0.1 10*3/uL (ref 0.0–0.1)
Basophils Relative: 0 %
Eosinophils Absolute: 0.2 10*3/uL (ref 0.0–0.5)
Eosinophils Relative: 1 %
HCT: 38.3 % — ABNORMAL LOW (ref 39.0–52.0)
Hemoglobin: 12.5 g/dL — ABNORMAL LOW (ref 13.0–17.0)
Immature Granulocytes: 0 %
Lymphocytes Relative: 80 %
Lymphs Abs: 14.4 10*3/uL — ABNORMAL HIGH (ref 0.7–4.0)
MCH: 28.7 pg (ref 26.0–34.0)
MCHC: 32.6 g/dL (ref 30.0–36.0)
MCV: 87.8 fL (ref 80.0–100.0)
Monocytes Absolute: 0.9 10*3/uL (ref 0.1–1.0)
Monocytes Relative: 5 %
Neutro Abs: 2.4 10*3/uL (ref 1.7–7.7)
Neutrophils Relative %: 14 %
Platelets: 158 10*3/uL (ref 150–400)
RBC: 4.36 MIL/uL (ref 4.22–5.81)
RDW: 16.7 % — ABNORMAL HIGH (ref 11.5–15.5)
WBC: 17.9 10*3/uL — ABNORMAL HIGH (ref 4.0–10.5)
nRBC: 0 % (ref 0.0–0.2)

## 2019-12-25 LAB — IRON AND TIBC
Iron: 43 ug/dL (ref 42–163)
Saturation Ratios: 17 % — ABNORMAL LOW (ref 20–55)
TIBC: 256 ug/dL (ref 202–409)
UIBC: 214 ug/dL (ref 117–376)

## 2019-12-25 LAB — FERRITIN: Ferritin: 40 ng/mL (ref 24–336)

## 2019-12-25 NOTE — Telephone Encounter (Signed)
-----   Message from [name], MD sent at 12/25/2019  9:31 AM EDT ----- Regarding: need iV iron His iron studies are low I sent scheduling msg to schedule IV iron x 2 start next week I plan to see him in sept: he will get labs to be done a week before I see him back Please call and let him know

## 2019-12-25 NOTE — Progress Notes (Signed)
[facility] OFFICE PROGRESS NOTE  Patient Care Team: [name], DO as PCP - General (Family Medicine) [name], MD (Inactive) as Consulting Physician (Gastroenterology) [name], MD as Consulting Physician (Hematology and Oncology) [name], MD as Referring Physician (Cardiology) [name], MD as Consulting Physician (Neurosurgery) [name], OD as Referring Physician Garald Balding, MD as Consulting Physician (Orthopedic Surgery) Trula Slade, DPM as Consulting Physician (Podiatry)  ASSESSMENT & PLAN:  Splenic marginal zone b-cell lymphoma [facility]) His exam is unremarkable and CBC is stable Due to lack of symptoms, I recommend observation only  Anemia in neoplastic disease He has multifactorial anemia, likely secondary to intermittent iron deficiency and anemia of chronic disease related to his splenic lymphoma  IDA (iron deficiency anemia) The most likely cause of his anemia is due tochronic blood loss/malabsorption syndrome. We discussed some of the risks, benefits, and alternatives of intravenous iron infusions. The patient is symptomatic from anemia and the iron level is critically low. He tolerated oral iron supplement poorly and desires to achieved higher levels of iron faster for adequate hematopoesis. Some of the side-effects to be expected including risks of infusion reactions, phlebitis, headaches, nausea and fatigue.  The patient is willing to proceed. Patient education material was dispensed.  Goal is to keep ferritin level greater than 50 and resolution of anemia    Orders Placed This Encounter  Procedures  . CBC with Differential/Platelet    Standing Status:   Future    Standing Expiration Date:   01/28/2021  . Comprehensive metabolic panel    Standing Status:   Future    Standing Expiration Date:   01/28/2021  . Lactate dehydrogenase    Standing Status:   Future    Standing Expiration Date:   12/24/2020  . Iron and  TIBC    Standing Status:   Future    Standing Expiration Date:   01/28/2021  . Ferritin    Standing Status:   Future    Standing Expiration Date:   01/28/2021  . Sedimentation rate    Standing Status:   Future    Standing Expiration Date:   01/28/2021    All questions were answered. The patient knows to call the clinic with any problems, questions or concerns. The total time spent in the appointment was 20 minutes encounter with patients including review of chart and various tests results, discussions about plan of care and coordination of care plan   [name], MD 12/25/2019 9:28 AM  INTERVAL HISTORY: Please see below for problem oriented charting. He returns for further follow-up for severe iron deficiency anemia and marginal zone lymphoma He denies new lymphadenopathy Appetite is stable No abnormal night sweats or weight loss He has good energy since we prescribed intravenous iron infusion couple months ago The patient denies any recent signs or symptoms of bleeding such as spontaneous epistaxis, hematuria or hematochezia.   SUMMARY OF ONCOLOGIC HISTORY:  This is a very pleasant gentleman who was found to have abnormal CBC for the past 5 years. In April 2013, flow cytometry detectable monoclonal B-cell population suspicious for splenic/marginal zone lymphoma. Ultrasound confirmed splenomegaly. Due to lack of symptoms he was being observed. In October 2017, he was noted to have microcytic, iron deficiency anemia. He was started on oral iron supplement On 03/05/2018, the patient had an accidental fall.  CT imaging revealed significant splenomegaly On 03/14/2018, he was started on 20 mg of prednisone daily On April 18, 2018, the  dose of prednisone was reduced to 10 mg daily, subsequently taper off In December 2019, CT imaging show chronic liver cirrhosis and splenomegaly without significant lymphadenopathy He received intravenous iron infusion in January 2021 for iron deficiency  anemia  REVIEW OF SYSTEMS:   Constitutional: Denies fevers, chills or abnormal weight loss Eyes: Denies blurriness of vision Ears, nose, mouth, throat, and face: Denies mucositis or sore throat Respiratory: Denies cough, dyspnea or wheezes Cardiovascular: Denies palpitation, chest discomfort or lower extremity swelling Gastrointestinal:  Denies nausea, heartburn or change in bowel habits Skin: Denies abnormal skin rashes Lymphatics: Denies new lymphadenopathy or easy bruising Neurological:Denies numbness, tingling or new weaknesses Behavioral/Psych: Mood is stable, no new changes  All other systems were reviewed with the patient and are negative.  I have reviewed the past medical history, past surgical history, social history and family history with the patient and they are unchanged from previous note.  ALLERGIES:  has No Known Allergies.  MEDICATIONS:  Current Outpatient Medications  Medication Sig Dispense Refill  . amLODipine (NORVASC) 5 MG tablet Take 1.5 tablets (7.5 mg total) by mouth daily. 135 tablet 1  . aspirin EC 81 MG tablet Take 81 mg by mouth daily.    Marland Kitchen atorvastatin (LIPITOR) 40 MG tablet Take 1 tablet (40 mg total) by mouth daily. 90 tablet 3  . diclofenac Sodium (VOLTAREN) 1 % GEL APPLY 2 GRAMS TO AFFECTED AREA 4 TIMES A DAY 600 g 1  . gabapentin (NEURONTIN) 100 MG capsule Take 1-3 capsules (100-300 mg total) by mouth at bedtime. 90 capsule 1  . lisinopril (ZESTRIL) 40 MG tablet Take 1 tablet (40 mg total) by mouth daily. 90 tablet 1  . metFORMIN (GLUCOPHAGE-XR) 500 MG 24 hr tablet TAKE 1 TABLET BY MOUTH EVERY MONDAY, WEDNESDAY,AND FRIDAY WITH EVENING MEAL 40 tablet 2  . metoprolol tartrate (LOPRESSOR) 25 MG tablet Take 0.5 tablets (12.5 mg total) by mouth 2 (two) times daily. 90 tablet 1  . nitroGLYCERIN (NITROSTAT) 0.4 MG SL tablet Place 1 tablet (0.4 mg total) under the tongue every 5 (five) minutes as needed for chest pain. 10 tablet 0   Current  Facility-Administered Medications  Medication Dose Route Frequency Provider Last Rate Last Admin  . 0.9 %  sodium chloride infusion  500 mL Intravenous Once Armbruster, Carlota Raspberry, MD        PHYSICAL EXAMINATION: ECOG PERFORMANCE STATUS: 1 - Symptomatic but completely ambulatory  Vitals:   12/25/19 0859  BP: (!) 156/59  Pulse: 62  Resp: 18  Temp: 98.2 F (36.8 C)  SpO2: 100%   Filed Weights   12/25/19 0859  Weight: 215 lb 3.2 oz (97.6 kg)    GENERAL:alert, no distress and comfortable SKIN: skin color, texture, turgor are normal, no rashes or significant lesions EYES: normal, Conjunctiva are pink and non-injected, sclera clear OROPHARYNX:no exudate, no erythema and lips, buccal mucosa, and tongue normal  NECK: supple, thyroid normal size, non-tender, without nodularity LYMPH:  no palpable lymphadenopathy in the cervical, axillary or inguinal LUNGS: clear to auscultation and percussion with normal breathing effort HEART: regular rate & rhythm and no murmurs and no lower extremity edema ABDOMEN:abdomen soft, non-tender and normal bowel sounds Musculoskeletal:no cyanosis of digits and no clubbing  NEURO: alert & oriented x 3 with fluent speech, no focal motor/sensory deficits  LABORATORY DATA:  I have reviewed the data as listed    Component Value Date/Time   NA 141 12/18/2019 0856   NA 144 05/31/2015 1404   K 4.3  12/18/2019 0856   K 4.7 05/31/2015 1404   CL 107 12/18/2019 0856   CL 102 10/07/2012 1013   CO2 29 12/18/2019 0856   CO2 29 05/31/2015 1404   GLUCOSE 138 (H) 12/18/2019 0856   GLUCOSE 99 05/31/2015 1404   GLUCOSE 199 (H) 10/07/2012 1013   BUN 19 12/18/2019 0856   BUN 18.3 05/31/2015 1404   CREATININE 0.75 12/18/2019 0856   CREATININE 0.86 03/13/2017 0759   CREATININE 0.9 05/31/2015 1404   CALCIUM 8.8 12/18/2019 0856   CALCIUM 9.2 05/31/2015 1404   PROT 6.0 12/18/2019 0856   PROT 6.3 (L) 05/31/2015 1404   ALBUMIN 4.2 12/18/2019 0856   ALBUMIN 3.9  05/31/2015 1404   AST 34 12/18/2019 0856   AST 28 05/31/2015 1404   ALT 23 12/18/2019 0856   ALT 23 05/31/2015 1404   ALKPHOS 76 12/18/2019 0856   ALKPHOS 79 05/31/2015 1404   BILITOT 0.5 12/18/2019 0856   BILITOT 0.46 05/31/2015 1404   GFRNONAA >60 09/26/2018 0827   GFRNONAA 85 03/13/2017 0759   GFRAA >60 09/26/2018 0827   GFRAA >89 03/13/2017 0759    No results found for: SPEP, UPEP  Lab Results  Component Value Date   WBC 17.9 (H) 12/25/2019   NEUTROABS 2.4 12/25/2019   HGB 12.5 (L) 12/25/2019   HCT 38.3 (L) 12/25/2019   MCV 87.8 12/25/2019   PLT 158 12/25/2019      Chemistry      Component Value Date/Time   NA 141 12/18/2019 0856   NA 144 05/31/2015 1404   K 4.3 12/18/2019 0856   K 4.7 05/31/2015 1404   CL 107 12/18/2019 0856   CL 102 10/07/2012 1013   CO2 29 12/18/2019 0856   CO2 29 05/31/2015 1404   BUN 19 12/18/2019 0856   BUN 18.3 05/31/2015 1404   CREATININE 0.75 12/18/2019 0856   CREATININE 0.86 03/13/2017 0759   CREATININE 0.9 05/31/2015 1404      Component Value Date/Time   CALCIUM 8.8 12/18/2019 0856   CALCIUM 9.2 05/31/2015 1404   ALKPHOS 76 12/18/2019 0856   ALKPHOS 79 05/31/2015 1404   AST 34 12/18/2019 0856   AST 28 05/31/2015 1404   ALT 23 12/18/2019 0856   ALT 23 05/31/2015 1404   BILITOT 0.5 12/18/2019 0856   BILITOT 0.46 05/31/2015 1404

## 2019-12-25 NOTE — Assessment & Plan Note (Signed)
The most likely cause of his anemia is due to chronic blood loss/malabsorption syndrome. ?We discussed some of the risks, benefits, and alternatives of intravenous iron infusions. The patient is symptomatic from anemia and the iron level is critically low. He tolerated oral iron supplement poorly and desires to achieved higher levels of iron faster for adequate hematopoesis. Some of the side-effects to be expected including risks of infusion reactions, phlebitis, headaches, nausea and fatigue.  The patient is willing to proceed. Patient education material was dispensed.  ?Goal is to keep ferritin level greater than 50 and resolution of anemia ? ?

## 2019-12-25 NOTE — Telephone Encounter (Signed)
Called and given below message. He verbalized understanding. 

## 2019-12-25 NOTE — Assessment & Plan Note (Addendum)
He has multifactorial anemia, likely secondary to intermittent iron deficiency and anemia of chronic disease related to his splenic lymphoma

## 2019-12-25 NOTE — Assessment & Plan Note (Signed)
His exam is unremarkable and CBC is stable Due to lack of symptoms, I recommend observation only 

## 2019-12-26 ENCOUNTER — Telehealth: Payer: Self-pay | Admitting: Hematology and Oncology

## 2019-12-26 NOTE — Telephone Encounter (Signed)
Scheduled per 5/13 sch msg. Pt aware of appts.

## 2020-01-02 ENCOUNTER — Other Ambulatory Visit: Payer: Self-pay

## 2020-01-02 ENCOUNTER — Inpatient Hospital Stay: Payer: Medicare Other

## 2020-01-02 VITALS — BP 146/62 | HR 41 | Temp 97.6°F | Resp 18

## 2020-01-02 DIAGNOSIS — K7460 Unspecified cirrhosis of liver: Secondary | ICD-10-CM | POA: Diagnosis not present

## 2020-01-02 DIAGNOSIS — D509 Iron deficiency anemia, unspecified: Secondary | ICD-10-CM | POA: Diagnosis not present

## 2020-01-02 DIAGNOSIS — Z79899 Other long term (current) drug therapy: Secondary | ICD-10-CM | POA: Diagnosis not present

## 2020-01-02 DIAGNOSIS — D500 Iron deficiency anemia secondary to blood loss (chronic): Secondary | ICD-10-CM

## 2020-01-02 DIAGNOSIS — C884 Extranodal marginal zone B-cell lymphoma of mucosa-associated lymphoid tissue [MALT-lymphoma]: Secondary | ICD-10-CM | POA: Diagnosis not present

## 2020-01-02 DIAGNOSIS — Z7984 Long term (current) use of oral hypoglycemic drugs: Secondary | ICD-10-CM | POA: Diagnosis not present

## 2020-01-02 MED ORDER — SODIUM CHLORIDE 0.9 % IV SOLN
510.0000 mg | Freq: Once | INTRAVENOUS | Status: AC
  Administered 2020-01-02: 510 mg via INTRAVENOUS
  Filled 2020-01-02: qty 510

## 2020-01-02 MED ORDER — SODIUM CHLORIDE 0.9 % IV SOLN
INTRAVENOUS | Status: DC
  Filled 2020-01-02: qty 250

## 2020-01-02 NOTE — Patient Instructions (Signed)

## 2020-01-09 ENCOUNTER — Other Ambulatory Visit: Payer: Self-pay

## 2020-01-09 ENCOUNTER — Inpatient Hospital Stay: Payer: Medicare Other

## 2020-01-09 VITALS — BP 136/54 | HR 62 | Temp 97.9°F | Resp 18

## 2020-01-09 DIAGNOSIS — K7460 Unspecified cirrhosis of liver: Secondary | ICD-10-CM | POA: Diagnosis not present

## 2020-01-09 DIAGNOSIS — D500 Iron deficiency anemia secondary to blood loss (chronic): Secondary | ICD-10-CM

## 2020-01-09 DIAGNOSIS — Z79899 Other long term (current) drug therapy: Secondary | ICD-10-CM | POA: Diagnosis not present

## 2020-01-09 DIAGNOSIS — C884 Extranodal marginal zone B-cell lymphoma of mucosa-associated lymphoid tissue [MALT-lymphoma]: Secondary | ICD-10-CM | POA: Diagnosis not present

## 2020-01-09 DIAGNOSIS — Z7984 Long term (current) use of oral hypoglycemic drugs: Secondary | ICD-10-CM | POA: Diagnosis not present

## 2020-01-09 DIAGNOSIS — D509 Iron deficiency anemia, unspecified: Secondary | ICD-10-CM | POA: Diagnosis not present

## 2020-01-09 MED ORDER — SODIUM CHLORIDE 0.9 % IV SOLN
INTRAVENOUS | Status: DC
  Filled 2020-01-09 (×2): qty 250

## 2020-01-09 MED ORDER — SODIUM CHLORIDE 0.9 % IV SOLN
510.0000 mg | Freq: Once | INTRAVENOUS | Status: AC
  Administered 2020-01-09: 510 mg via INTRAVENOUS
  Filled 2020-01-09: qty 510

## 2020-01-09 NOTE — Patient Instructions (Signed)

## 2020-01-22 DIAGNOSIS — Z87891 Personal history of nicotine dependence: Secondary | ICD-10-CM | POA: Diagnosis not present

## 2020-01-22 DIAGNOSIS — I2510 Atherosclerotic heart disease of native coronary artery without angina pectoris: Secondary | ICD-10-CM | POA: Diagnosis not present

## 2020-02-11 ENCOUNTER — Other Ambulatory Visit: Payer: Self-pay

## 2020-02-11 ENCOUNTER — Ambulatory Visit (INDEPENDENT_AMBULATORY_CARE_PROVIDER_SITE_OTHER): Payer: Medicare Other | Admitting: Family Medicine

## 2020-02-11 ENCOUNTER — Encounter: Payer: Self-pay | Admitting: Family Medicine

## 2020-02-11 VITALS — BP 135/81 | HR 74 | Temp 97.6°F | Resp 16 | Ht 72.0 in | Wt 212.0 lb

## 2020-02-11 DIAGNOSIS — M62838 Other muscle spasm: Secondary | ICD-10-CM

## 2020-02-11 DIAGNOSIS — M5412 Radiculopathy, cervical region: Secondary | ICD-10-CM

## 2020-02-11 MED ORDER — PREDNISONE 20 MG PO TABS: ORAL_TABLET | ORAL | 0 refills | Status: DC

## 2020-02-11 MED ORDER — CYCLOBENZAPRINE HCL 5 MG PO TABS
5.0000 mg | ORAL_TABLET | Freq: Two times a day (BID) | ORAL | 1 refills | Status: DC | PRN
Start: 2020-02-11 — End: 2020-06-04

## 2020-02-11 NOTE — Progress Notes (Signed)
This visit occurred during the SARS-CoV-2 public health emergency.  Safety protocols were in place, including screening questions prior to the visit, additional usage of staff PPE, and extensive cleaning of exam room while observing appropriate contact time as indicated for disinfecting solutions.    Marcus Taylor , 12-22-1940, 79 y.o., male MRN: [id number] Patient Care Team    Relationship Specialty Notifications Start End  [name], DO PCP - General Family Medicine  01/26/16   [name], MD (Inactive) Consulting Physician Gastroenterology  01/26/16   [name], MD Consulting Physician Hematology and Oncology  01/26/16   [name], MD Referring Physician Cardiology  01/26/16   [name], MD Consulting Physician Neurosurgery  01/27/16   [name], OD Referring Physician   05/03/16    Comment: opthalmology   Garald Balding, MD Consulting Physician Orthopedic Surgery  06/14/18   Trula Slade, DPM Consulting Physician Podiatry  06/14/18     Chief Complaint  Patient presents with  . Neck Pain    Pt has neck pain that comes and goes that goes into right shoulder. No injury  . Shoulder Pain     Subjective: Pt presents for an OV with complaints of right sided neck and shoulder pain that has been present for 6 months to a year, but has been worsening last few weeks.  Associated symptoms include burning pain right trap muscle and neck. He reports pain worsened after  Her was fixing his car and a wrench slipped. That evening he started to experience more pain. He has pain when extending arm without support. Laying on his right side is painful. He denies radiation pain down his arm.  Reviewed CT cervical spine from 03/05/2018- which was abnormal with disc narrowing, degenerative changes, facet changes, osteophytes and anterolisthesis C7-T1.  Depression screen [facility] 2/9 06/05/2019 06/14/2018 05/20/2018 03/20/2018 03/12/2018  Decreased Interest 0 0 0 0 0  Down,  Depressed, Hopeless 0 0 0 0 0  PHQ - 2 Score 0 0 0 0 0  Some recent data might be hidden    No Known Allergies Social History   Social History Narrative   Married to [place].    HS education. Retired.    Former smoker. No drugs or ETOH.   Wears seatbelt.   Wears dentures.   Smoke detector in the home. Firearms  In the home.    Feels safe in his relationships.    Past Medical History:  Diagnosis Date  . Anemia   . Arthritis   . Colonic polyp   . Concussion 03/05/2018  . Coronary artery disease    stents x 3 (03-2014)  . Diabetes mellitus   . Diverticulosis    throughout entire colon  . HTN (hypertension)   . Hypertriglyceridemia   . Ingrown toenail 10/21/2019  . Kidney stone   . Lymphocytosis   . Metatarsal fracture left   left 5th proximal phalanx   . OSA (obstructive sleep apnea)    "Mild" by records  . PONV (postoperative nausea and vomiting)    first time, no problem since then  . RBBB 01/2016  . Splenic marginal zone b-cell lymphoma ([place]) 12/18/2011  . Splenomegaly   . STEMI (ST elevation myocardial infarction) [facility])    August 2015  . Subarachnoid hematoma ([place]) 03/12/2018  . Subarachnoid hemorrhage ([place]) 03/05/2018   Past Surgical History:  Procedure Laterality Date  . CARDIAC CATHETERIZATION     stents x 3 (03/2014)  .  CHOLECYSTECTOMY    . HERNIA REPAIR    . INGUINAL HERNIA REPAIR Right 05/27/2015   Procedure: LAPAROSCOPIC REPAIR RIGHT  INGUINAL HERNIA;  Surgeon: Greer Pickerel, MD;  [facility];  Service: General;  Laterality: Right;  . INSERTION OF MESH Right 05/27/2015   Procedure: INSERTION OF MESH;  Surgeon: Greer Pickerel, MD;  [facility];  Service: General;  Laterality: Right;  . JOINT REPLACEMENT    . LUMBAR DISC SURGERY    . TONSILLECTOMY AND ADENOIDECTOMY    . TOTAL KNEE ARTHROPLASTY Right 07/09/2018  . TOTAL KNEE ARTHROPLASTY Right 07/09/2018   Procedure: RIGHT TOTAL KNEE ARTHROPLASTY;  Surgeon: Garald Balding, MD;  Location: [place];   Service: Orthopedics;  Laterality: Right;   Family History  Problem Relation Age of Onset  . Kidney disease Sister   . Colon cancer Sister 85  . Colon cancer Brother   . Bone cancer Brother   . Arthritis Mother   . Breast cancer Mother   . Heart disease Mother   . Early death Father   . Arthritis Son   . Esophageal cancer Sister   . Bladder Cancer Sister   . Rectal cancer Neg Hx   . Stomach cancer Neg Hx    Allergies as of 02/11/2020   No Known Allergies     Medication List       Accurate as of February 11, 2020 11:59 PM. If you have any questions, ask your nurse or doctor.        amLODipine 5 MG tablet Commonly known as: NORVASC Take 1.5 tablets (7.5 mg total) by mouth daily.   aspirin EC 81 MG tablet Take 81 mg by mouth daily.   atorvastatin 40 MG tablet Commonly known as: LIPITOR Take 1 tablet (40 mg total) by mouth daily.   cyclobenzaprine 5 MG tablet Commonly known as: FLEXERIL Take 1 tablet (5 mg total) by mouth 2 (two) times daily as needed for muscle spasms. Started by: Howard Pouch, DO   diclofenac Sodium 1 % Gel Commonly known as: VOLTAREN APPLY 2 GRAMS TO AFFECTED AREA 4 TIMES A DAY   gabapentin 100 MG capsule Commonly known as: NEURONTIN Take 1-3 capsules (100-300 mg total) by mouth at bedtime.   lisinopril 40 MG tablet Commonly known as: ZESTRIL Take 1 tablet (40 mg total) by mouth daily.   metFORMIN 500 MG 24 hr tablet Commonly known as: GLUCOPHAGE-XR TAKE 1 TABLET BY MOUTH EVERY MONDAY, WEDNESDAY,AND FRIDAY WITH EVENING MEAL   metoprolol tartrate 25 MG tablet Commonly known as: LOPRESSOR Take 0.5 tablets (12.5 mg total) by mouth 2 (two) times daily.   nitroGLYCERIN 0.4 MG SL tablet Commonly known as: NITROSTAT Place 1 tablet (0.4 mg total) under the tongue every 5 (five) minutes as needed for chest pain.   predniSONE 20 MG tablet Commonly known as: DELTASONE 60 mg x3d, 40 mg x3d, 20 mg x2d, 10 mg x2d Started by: Howard Pouch, DO         All past medical history, surgical history, allergies, family history, immunizations andmedications were updated in the EMR today and reviewed under the history and medication portions of their EMR.     ROS: Negative, with the exception of above mentioned in HPI   Objective:  BP 135/81 (BP Location: Left Arm, Patient Position: Sitting, Cuff Size: Normal)   Pulse 74   Temp 97.6 F (36.4 C) (Temporal)   Resp 16   Ht 6' (1.829 m)   Wt 212 lb (96.2  kg)   SpO2 96%   BMI 28.75 kg/m  Body mass index is 28.75 kg/m. Gen: Afebrile. No acute distress. Nontoxic in appearance, well developed, well nourished.  HENT: AT. [place].  Eyes:Pupils Equal Round Reactive to light, Extraocular movements intact,  Conjunctiva without redness, discharge or icterus. MSK: no erythema, no soft tissue swelling. Decreased ROM d/t discomfort right rotation and extension.  Skin: no rashes, purpura or petechiae.  Neuro:  Normal gait. PERLA. EOMi. Alert. Oriented x3 Psych: Normal affect, dress and demeanor. Normal speech. Normal thought content and judgment.  No exam data present No results found. No results found for this or any previous visit (from the past 24 hour(s)).  Assessment/Plan: Marcus Taylor is a 79 y.o. male present for OV for  Cervical radiculopathy/muscle spasm - rest muscle. Heat application discussed.  - steroid taper.  - muscle relaxant prescribed for BID PRN use- caution on sedation- at least try to use QHS for a week.  - declined pain medication.  - Ambulatory referral to Physical Therapy in [facility].  F/U 2-4 weeks if not improving, sooner if worsening.     Reviewed expectations re: course of current medical issues.  Discussed self-management of symptoms.  Outlined signs and symptoms indicating need for more acute intervention.  Patient verbalized understanding and all questions were answered.  Patient received an After-Visit Summary.    Orders Placed This Encounter   Procedures  . Ambulatory referral to Physical Therapy   Meds ordered this encounter  Medications  . cyclobenzaprine (FLEXERIL) 5 MG tablet    Sig: Take 1 tablet (5 mg total) by mouth 2 (two) times daily as needed for muscle spasms.    Dispense:  30 tablet    Refill:  1  . predniSONE (DELTASONE) 20 MG tablet    Sig: 60 mg x3d, 40 mg x3d, 20 mg x2d, 10 mg x2d    Dispense:  18 tablet    Refill:  0    Referral Orders     Ambulatory referral to Physical Therapy   Note is dictated utilizing voice recognition software. Although note has been proof read prior to signing, occasional typographical errors still can be missed. If any questions arise, please do not hesitate to call for verification.   electronically signed by:  Howard Pouch, DO  [place]

## 2020-02-11 NOTE — Patient Instructions (Signed)
You can increase gabapentin to include one additional pill around lunch.  Start prednisone taper.  Flexeril/cyclobenzaprine 1 pill before bed- caution on sedation. If it does not make you sedated you can take once in the day also.  Physical therapy start in 1 week. Follow 4 weeks if not resolved and want additional treatment or sooner if worsening.   Cervical Radiculopathy  Cervical radiculopathy means that a nerve in the neck (a cervical nerve) is pinched or bruised. This can happen because of an injury to the cervical spine (vertebrae) in the neck, or as a normal part of getting older. This can cause pain or loss of feeling (numbness) that runs from your neck all the way down to your arm and fingers. Often, this condition gets better with rest. Treatment may be needed if the condition does not get better. What are the causes?  A neck injury.  A bulging disk in your spine.  Muscle movements that you cannot control (muscle spasms).  Tight muscles in your neck due to overuse.  Arthritis.  Breakdown in the bones and joints of the spine (spondylosis) due to getting older.  Bone spurs that form near the nerves in the neck. What are the signs or symptoms?  Pain. The pain may: ? Run from the neck to the arm and hand. ? Be very bad or irritating. ? Be worse when you move your neck.  Loss of feeling or tingling in your arm or hand.  Weakness in your arm or hand, in very bad cases. How is this treated? In many cases, treatment is not needed for this condition. With rest, the condition often gets better over time. If treatment is needed, options may include:  Wearing a soft neck collar (cervical collar) for short periods of time, as told by your doctor.  Doing exercises (physical therapy) to strengthen your neck muscles.  Taking medicines.  Having shots (injections) in your spine, in very bad cases.  Having surgery. This may be needed if other treatments do not help. The type of  surgery that is used depends on the cause of your condition. Follow these instructions at home: If you have a soft neck collar:  Wear it as told by your doctor. Remove it only as told by your doctor.  Ask your doctor if you can remove the collar for cleaning and bathing. If you are allowed to remove the collar for cleaning or bathing: ? Follow instructions from your doctor about how to remove the collar safely. ? Clean the collar by wiping it with mild soap and water and drying it completely. ? Take out any removable pads in the collar every 1-2 days. Wash them by hand with soap and water. Let them air-dry completely before you put them back in the collar. ? Check your skin under the collar for redness or sores. If you see any, tell your doctor. Managing pain      Take over-the-counter and prescription medicines only as told by your doctor.  If told, put ice on the painful area. ? If you have a soft neck collar, remove it as told by your doctor. ? Put ice in a plastic bag. ? Place a towel between your skin and the bag. ? Leave the ice on for 20 minutes, 2-3 times a day.  If using ice does not help, you can try using heat. Use the heat source that your doctor recommends, such as a moist heat pack or a heating pad. ? Place a  towel between your skin and the heat source. ? Leave the heat on for 20-30 minutes. ? Remove the heat if your skin turns bright red. This is very important if you are unable to feel pain, heat, or cold. You may have a greater risk of getting burned.  You may try a gentle neck and shoulder rub (massage). Activity  Rest as needed.  Return to your normal activities as told by your doctor. Ask your doctor what activities are safe for you.  Do exercises as told by your doctor or physical therapist.  Do not lift anything that is heavier than 10 lb (4.5 kg) until your doctor tells you that it is safe. General instructions  Use a flat pillow when you sleep.  Do  not drive while wearing a soft neck collar. If you do not have a soft neck collar, ask your doctor if it is safe to drive while your neck heals.  Ask your doctor if the medicine prescribed to you requires you to avoid driving or using heavy machinery.  Do not use any products that contain nicotine or tobacco, such as cigarettes, e-cigarettes, and chewing tobacco. These can delay healing. If you need help quitting, ask your doctor.  Keep all follow-up visits as told by your doctor. This is important. Contact a doctor if:  Your condition does not get better with treatment. Get help right away if:  Your pain gets worse and is not helped with medicine.  You lose feeling or feel weak in your hand, arm, face, or leg.  You have a high fever.  You have a stiff neck.  You cannot control when you poop or pee (have incontinence).  You have trouble with walking, balance, or talking. Summary  Cervical radiculopathy means that a nerve in the neck is pinched or bruised.  A nerve can get pinched from a bulging disk, arthritis, an injury to the neck, or other causes.  Symptoms include pain, tingling, or loss of feeling that goes from the neck into the arm or hand.  Weakness in your arm or hand can happen in very bad cases.  Treatment may include resting, wearing a soft neck collar, and doing exercises. You might need to take medicines for pain. In very bad cases, shots or surgery may be needed. This information is not intended to replace advice given to you by your health care provider. Make sure you discuss any questions you have with your health care provider. Document Revised: 06/21/2018 Document Reviewed: 06/21/2018 Elsevier Patient Education  2020 [employer].

## 2020-02-27 ENCOUNTER — Encounter: Payer: Self-pay | Admitting: Orthopaedic Surgery

## 2020-02-27 ENCOUNTER — Ambulatory Visit: Payer: Medicare Other | Admitting: Orthopaedic Surgery

## 2020-02-27 ENCOUNTER — Other Ambulatory Visit: Payer: Self-pay

## 2020-02-27 ENCOUNTER — Ambulatory Visit: Payer: Self-pay

## 2020-02-27 DIAGNOSIS — M4802 Spinal stenosis, cervical region: Secondary | ICD-10-CM

## 2020-02-27 DIAGNOSIS — M25511 Pain in right shoulder: Secondary | ICD-10-CM

## 2020-02-27 DIAGNOSIS — M5412 Radiculopathy, cervical region: Secondary | ICD-10-CM | POA: Diagnosis not present

## 2020-02-27 MED ORDER — TRAMADOL HCL 50 MG PO TABS: 50.0000 mg | ORAL_TABLET | Freq: Two times a day (BID) | ORAL | 1 refills | Status: DC | PRN

## 2020-02-27 MED ORDER — TRAMADOL HCL 50 MG PO TABS: 50.0000 mg | ORAL_TABLET | Freq: Four times a day (QID) | ORAL | 0 refills | Status: DC | PRN

## 2020-02-27 NOTE — Progress Notes (Signed)
Office Visit Note   Patient: Marcus Taylor           Date of Birth: 1941-03-01           MRN: [id number] Visit Date: 02/27/2020              Requested by: [name], DO [address] [place],  [place] [address] PCP: [name], DO   Assessment & Plan: Visit Diagnoses:  1. Spinal stenosis of cervical region   2. Right shoulder pain, unspecified chronicity   3. Cervical radiculopathy     Plan: Marcus Taylor appears to be having recurrent symptoms of cervical arthritis associated with cervical spinal stenosis.  He had an MRI and CT scan of his cervical spine in 2019 demonstrating advanced cervical spine degeneration with multilevel spondylolisthesis.  No acute abnormalities at the time were identified.  He had multifactorial spinal stenosis with up to moderate spinal cord mass-effect at C3-4 and mild stenosis at C2-3 and C4-5.  There were multifactorial moderate and severe cervical neural foraminal stenosis at virtually all levels.  In the past he has responded to prednisone taper.  On this occasion he has been seen by his primary care physician with a prednisone taper and muscle relaxant and relates he still having pain on the right side of his neck and into the levator scapular muscles.  He also was having what appears to be some radicular pain into the forearm.  We will place him on tramadol, set up therapy at [facility] and have Dr. Ernestina Patches consider an epidural cervical injection.  Return in 1 month.  If no improvement I would repeat the MRI scan  Follow-Up Instructions: Return in about 1 month (around 03/29/2020).   Orders:  Orders Placed This Encounter  Procedures  . XR Cervical Spine 2 or 3 views  . XR Shoulder Right  . Ambulatory referral to Physical Therapy  . Ambulatory referral to Physical Medicine Rehab   Meds ordered this encounter  Medications  . traMADol (ULTRAM) 50 MG tablet    Sig: Take 1 tablet (50 mg total) by mouth every 6 (six) hours as needed.     Dispense:  30 tablet    Refill:  0  . traMADol (ULTRAM) 50 MG tablet    Sig: Take 1 tablet (50 mg total) by mouth every 12 (twelve) hours as needed.    Dispense:  30 tablet    Refill:  1      Procedures: No procedures performed   Clinical Data: No additional findings.   Subjective: Chief Complaint  Patient presents with  . Neck - Pain  . Right Shoulder - Pain  Marcus Taylor has a chronic history of problems referable to the cervical spine.  He had an MRI and CT of his neck in 2019 demonstrating multiple areas of arthritis and central and neuroforaminal stenosis.  He has responded to oral prednisone.  On this occasion he was seen by his primary care physician several weeks ago with recurrent symptoms and has not responded to prednisone and muscle relaxant.  His pain seems to be localized on the lateral right side of his neck and the levator scapula muscle.  He is not having specific shoulder pain.  He also is experiencing some forearm discomfort.  No injury or trauma  HPI  Review of Systems   Objective: Vital Signs: There were no vitals taken for this visit.  Physical Exam Constitutional:      Appearance: He is  well-developed.  Eyes:     Pupils: Pupils are equal, round, and reactive to light.  Pulmonary:     Effort: Pulmonary effort is normal.  Skin:    General: Skin is warm and dry.  Neurological:     Mental Status: He is alert and oriented to person, place, and time.  Psychiatric:        Behavior: Behavior normal.     Ortho Exam right shoulder with negative impingement and empty can testing.  No pain with any range of motion has full overhead motion.  He has significant degenerative changes in the small joints of both hands and has limited ability to make a full fist.  Negative Tinel's and Phalen's over the median nerve both wrists.  He does have some limitation of motion of the cervical spine in flexion extension and rotation consistent with his arthritis.  No  tenderness or mass formation along the right side of his neck.  No localized areas of tenderness about the right shoulder.  No crepitation with shoulder motion.  Specialty Comments:  No specialty comments available.  Imaging: XR Cervical Spine 2 or 3 views  Result Date: 02/27/2020 Films of the cervical spine were obtained in 2 projections.  These were compared to films were performed in 2019 without much change.  There is about 30% anterior listhesis of C7 on T1.  Degenerative disc disease with disc space narrowing at C5-6 C6-7 and C7-T1.  No acute changes.  Films are consistent with diffuse osteoarthritis  XR Shoulder Right  Result Date: 02/27/2020 Films of the right shoulder obtained in several projections.  There may be slight superior migration of the humeral head in the glenoid but a normal space between the humeral head and the acromium.  No ectopic calcification.  No obvious arthritic changes of the glenohumeral joint.  Arthritic changes at the [facility] joint.  No acute changes or fracture    PMFS History: Patient Active Problem List   Diagnosis Date Noted  . Cervical radiculopathy 12/22/2019  . Liver cirrhosis ([place]) 07/25/2018  . History of total right knee replacement 07/09/2018  . Pancytopenia, acquired ([place]) 04/19/2018  . Easy bruising 04/19/2018  . Primary osteoarthritis of right knee 04/19/2018  . Decreased hearing of both ears 03/20/2018  . Splenomegaly 03/12/2018  . Enlarged prostate without lower urinary tract symptoms (luts) 03/12/2018  . Neck pain 02/19/2018  . IDA (iron deficiency anemia) 08/09/2016  . Thrombocytopenia ([place]) 05/30/2016  . Diabetes mellitus type 2, controlled, with complications ([place]) 62/83/1517  . Right inguinal hernia 02/03/2016  . History of colon polyps 01/27/2016  . History of ST elevation myocardial infarction (STEMI) 01/27/2016  . CAD (coronary artery disease) 01/27/2016  . Hyperlipidemia 01/27/2016  . Anemia in neoplastic disease 05/25/2014    . Splenic marginal zone b-cell lymphoma ([place]) 12/18/2011  . HTN (hypertension)    Past Medical History:  Diagnosis Date  . Anemia   . Arthritis   . Colonic polyp   . Concussion 03/05/2018  . Coronary artery disease    stents x 3 (03-2014)  . Diabetes mellitus   . Diverticulosis    throughout entire colon  . HTN (hypertension)   . Hypertriglyceridemia   . Ingrown toenail 10/21/2019  . Kidney stone   . Lymphocytosis   . Metatarsal fracture left   left 5th proximal phalanx   . OSA (obstructive sleep apnea)    "Mild" by records  . PONV (postoperative nausea and vomiting)    first time, no problem since  then  . RBBB 01/2016  . Splenic marginal zone b-cell lymphoma ([place]) 12/18/2011  . Splenomegaly   . STEMI (ST elevation myocardial infarction) [facility])    August 2015  . Subarachnoid hematoma ([place]) 03/12/2018  . Subarachnoid hemorrhage ([place]) 03/05/2018    Family History  Problem Relation Age of Onset  . Kidney disease Sister   . Colon cancer Sister 81  . Colon cancer Brother   . Bone cancer Brother   . Arthritis Mother   . Breast cancer Mother   . Heart disease Mother   . Early death Father   . Arthritis Son   . Esophageal cancer Sister   . Bladder Cancer Sister   . Rectal cancer Neg Hx   . Stomach cancer Neg Hx     Past Surgical History:  Procedure Laterality Date  . CARDIAC CATHETERIZATION     stents x 3 (03/2014)  . CHOLECYSTECTOMY    . HERNIA REPAIR    . INGUINAL HERNIA REPAIR Right 05/27/2015   Procedure: LAPAROSCOPIC REPAIR RIGHT  INGUINAL HERNIA;  Surgeon: Greer Pickerel, MD;  [facility];  Service: General;  Laterality: Right;  . INSERTION OF MESH Right 05/27/2015   Procedure: INSERTION OF MESH;  Surgeon: Greer Pickerel, MD;  [facility];  Service: General;  Laterality: Right;  . JOINT REPLACEMENT    . LUMBAR DISC SURGERY    . TONSILLECTOMY AND ADENOIDECTOMY    . TOTAL KNEE ARTHROPLASTY Right 07/09/2018  . TOTAL KNEE ARTHROPLASTY Right 07/09/2018    Procedure: RIGHT TOTAL KNEE ARTHROPLASTY;  Surgeon: Garald Balding, MD;  Location: [place];  Service: Orthopedics;  Laterality: Right;   Social History   Occupational History    Comment: used to work as an Proofreader; now retired  Tobacco Use  . Smoking status: Former Smoker    Packs/day: 1.00    Years: 20.00    Pack years: 20.00    Quit date: 08/14/1988    Years since quitting: 31.5  . Smokeless tobacco: Never Used  Vaping Use  . Vaping Use: Never used  Substance and Sexual Activity  . Alcohol use: No  . Drug use: No  . Sexual activity: Yes    Birth control/protection: None     Garald Balding, MD   Note - This record has been created using [employer].  Chart creation errors have been sought, but may not always  have been located. Such creation errors do not reflect on  the standard of medical care.

## 2020-03-04 ENCOUNTER — Other Ambulatory Visit: Payer: Self-pay

## 2020-03-04 ENCOUNTER — Ambulatory Visit: Payer: Medicare Other | Admitting: Physical Medicine and Rehabilitation

## 2020-03-04 ENCOUNTER — Ambulatory Visit: Payer: Self-pay

## 2020-03-04 VITALS — BP 133/73 | HR 57

## 2020-03-04 DIAGNOSIS — M5412 Radiculopathy, cervical region: Secondary | ICD-10-CM

## 2020-03-04 MED ORDER — METHYLPREDNISOLONE ACETATE 80 MG/ML IJ SUSP
80.0000 mg | Freq: Once | INTRAMUSCULAR | Status: AC
Start: 2020-03-04 — End: 2020-03-04
  Administered 2020-03-04: 80 mg

## 2020-03-04 NOTE — Progress Notes (Signed)
Pt states neck and shoulder pain. Pt states move his arm makes the pain. Pt state nothing makes the pain better.   Numeric Pain Rating Scale and Functional Assessment Average Pain 5   In the last MONTH (on 0-10 scale) has pain interfered with the following?  1. General activity like being  able to carry out your everyday physical activities such as walking, climbing stairs, carrying groceries, or moving a chair?  Rating(10)   +Driver, -BT, -Dye Allergies.

## 2020-03-12 ENCOUNTER — Telehealth: Payer: Self-pay | Admitting: Physical Medicine and Rehabilitation

## 2020-03-12 NOTE — Telephone Encounter (Signed)
Is auth needed for [phone] and [phone]? Scheduled for 8/9 with driver.

## 2020-03-12 NOTE — Telephone Encounter (Signed)
-----   Message from Magnus Sinning, MD sent at 03/05/2020  8:08 AM EDT ----- Regarding: RE: Lumbar Injection If all back pain then L3-4 and L4-5 facet, if into hips and or leg then bilateral L4 tf esi ----- Message ----- From: Sherre Scarlet, RT Sent: 03/04/2020   4:21 PM EDT To: Magnus Sinning, MD Subject: Lumbar Injection                               Do I need to call the patient to schedule a lumbar injection?

## 2020-03-18 ENCOUNTER — Encounter: Payer: Self-pay | Admitting: Orthopaedic Surgery

## 2020-03-18 ENCOUNTER — Ambulatory Visit: Payer: Medicare Other | Admitting: Orthopaedic Surgery

## 2020-03-18 VITALS — Ht 72.0 in | Wt 212.0 lb

## 2020-03-18 DIAGNOSIS — M5412 Radiculopathy, cervical region: Secondary | ICD-10-CM

## 2020-03-18 DIAGNOSIS — M542 Cervicalgia: Secondary | ICD-10-CM | POA: Diagnosis not present

## 2020-03-18 MED ORDER — LIDOCAINE HCL 1 % IJ SOLN
1.0000 mL | INTRAMUSCULAR | Status: AC | PRN
  Administered 2020-03-18: 1 mL

## 2020-03-18 MED ORDER — METHYLPREDNISOLONE ACETATE 40 MG/ML IJ SUSP
40.0000 mg | INTRAMUSCULAR | Status: AC | PRN
  Administered 2020-03-18: 40 mg via INTRAMUSCULAR

## 2020-03-18 NOTE — Progress Notes (Signed)
Office Visit Note   Patient: Marcus Taylor           Date of Birth: 1941/04/28           MRN: [id number] Visit Date: 03/18/2020              Requested by: [name], DO [address] [place],  [place] [address] PCP: [name], DO   Assessment & Plan: Visit Diagnoses:  1. Cervical radiculopathy   2. Neck pain     Plan: [facility] relates that [facility] injection made a big difference in terms of his neck pain and burning.  He also no longer has the pain referred to the upper extremity.  He does have an area of tenderness near the levator scapular in the right side of his neck when he rotates his neck to the right.  He does seem to have some area of spasm in that region.  I am going to inject those areas of trigger point tenderness and have him follow-up with physical therapy at [facility].  He apparently never had the appointment.  Check him in 1 month and evaluate response to the injections.  We will always consider another MRI scan if no improvement  Follow-Up Instructions: Return in about 1 month (around 04/18/2020).   Orders:  Orders Placed This Encounter  Procedures  . Trigger Point Inj   No orders of the defined types were placed in this encounter.     Procedures: Trigger Point Inj  Date/Time: 03/18/2020 1:52 PM Performed by: Marcus Balding, MD Authorized by: Marcus Balding, MD   Total # of Trigger Points:  1 Location: neck   Needle Size:  27 G Approach:  Dorsal Medications #1:  1 mL lidocaine 1 %; 40 mg methylPREDNISolone acetate 40 MG/ML Comments: Several small areas of trigger point tenderness at the base of the right side of the neck     Clinical Data: No additional findings.   Subjective: Chief Complaint  Patient presents with  . Neck - Follow-up  Patient presents today for follow up on his neck pain. He saw [facility] on 03/04/2020. Patient received an injection with [facility] and states that it did help his neck pain, but he  continues to have the same pain behind his right shoulder. He is taking tylenol as needed. The area of pain in the levator scapular and base of the neck to the right only occurs when he rotates his neck to the right.  He did have a little bit of tenderness in area of trigger point tenderness but it seems to be exacerbated with neck rotation.  There is no referred pain to the right shoulder or right upper extremity.  HPI  Review of Systems  Constitutional: Negative for fatigue.  HENT: Negative for ear pain.   Eyes: Negative for pain.  Respiratory: Negative for shortness of breath.   Cardiovascular: Positive for leg swelling.  Gastrointestinal: Negative for constipation and diarrhea.  Endocrine: Negative for cold intolerance and heat intolerance.  Genitourinary: Negative for difficulty urinating.  Musculoskeletal: Negative for joint swelling.  Skin: Negative for rash.  Allergic/Immunologic: Negative for food allergies.  Neurological: Negative for weakness.  Hematological: Does not bruise/bleed easily.  Psychiatric/Behavioral: Negative for suicidal ideas.     Objective: Vital Signs: Ht 6' (1.829 m)   Wt 212 lb (96.2 kg)   BMI 28.75 kg/m   Physical Exam Constitutional:      Appearance: He is well-developed.  Eyes:     Pupils: Pupils are equal, round, and reactive to light.  Pulmonary:     Effort: Pulmonary effort is normal.  Skin:    General: Skin is warm and dry.  Neurological:     Mental Status: He is alert and oriented to person, place, and time.  Psychiatric:        Behavior: Behavior normal.     Ortho Exam awake alert and oriented x3.  Comfortable sitting.  Good grip and release.  No referred pain related pain to the right upper extremity.  Is able to touch his chin to his chest.  Has some neck extension loss as he had in the past.  No pain with rotation of his neck to the left.  When he rotates his neck to the right he has some pain in the area of the right posterior  levator scapula and base of his neck.  No masses.  Specialty Comments:  No specialty comments available.  Imaging: No results found.   PMFS History: Patient Active Problem List   Diagnosis Date Noted  . Cervical radiculopathy 12/22/2019  . Liver cirrhosis ([place]) 07/25/2018  . History of total right knee replacement 07/09/2018  . Pancytopenia, acquired ([place]) 04/19/2018  . Easy bruising 04/19/2018  . Primary osteoarthritis of right knee 04/19/2018  . Decreased hearing of both ears 03/20/2018  . Splenomegaly 03/12/2018  . Enlarged prostate without lower urinary tract symptoms ([place]) 03/12/2018  . Neck pain 02/19/2018  . IDA (iron deficiency anemia) 08/09/2016  . Thrombocytopenia ([place]) 05/30/2016  . Diabetes mellitus type 2, controlled, with complications ([place]) 11/57/2620  . Right inguinal hernia 02/03/2016  . History of colon polyps 01/27/2016  . History of ST elevation myocardial infarction (STEMI) 01/27/2016  . CAD (coronary artery disease) 01/27/2016  . Hyperlipidemia 01/27/2016  . Anemia in neoplastic disease 05/25/2014  . Splenic marginal zone b-cell lymphoma ([place]) 12/18/2011  . HTN (hypertension)    Past Medical History:  Diagnosis Date  . Anemia   . Arthritis   . Colonic polyp   . Concussion 03/05/2018  . Coronary artery disease    stents x 3 (03-2014)  . Diabetes mellitus   . Diverticulosis    throughout entire colon  . HTN (hypertension)   . Hypertriglyceridemia   . Ingrown toenail 10/21/2019  . Kidney stone   . Lymphocytosis   . Metatarsal fracture left   left 5th proximal phalanx   . OSA (obstructive sleep apnea)    "Mild" by records  . PONV (postoperative nausea and vomiting)    first time, no problem since then  . RBBB 01/2016  . Splenic marginal zone b-cell lymphoma ([place]) 12/18/2011  . Splenomegaly   . STEMI (ST elevation myocardial infarction) [facility])    August 2015  . Subarachnoid hematoma ([place]) 03/12/2018  . Subarachnoid hemorrhage ([place]) 03/05/2018     Family History  Problem Relation Age of Onset  . Kidney disease Sister   . Colon cancer Sister 66  . Colon cancer Brother   . Bone cancer Brother   . Arthritis Mother   . Breast cancer Mother   . Heart disease Mother   . Early death Father   . Arthritis Son   . Esophageal cancer Sister   . Bladder Cancer Sister   . Rectal cancer Neg Hx   . Stomach cancer Neg Hx     Past Surgical History:  Procedure Laterality Date  . CARDIAC CATHETERIZATION     stents x 3 (03/2014)  .  CHOLECYSTECTOMY    . HERNIA REPAIR    . INGUINAL HERNIA REPAIR Right 05/27/2015   Procedure: LAPAROSCOPIC REPAIR RIGHT  INGUINAL HERNIA;  Surgeon: Greer Pickerel, MD;  Location: [facility];  Service: General;  Laterality: Right;  . INSERTION OF MESH Right 05/27/2015   Procedure: INSERTION OF MESH;  Surgeon: Greer Pickerel, MD;  Location: [facility];  Service: General;  Laterality: Right;  . JOINT REPLACEMENT    . LUMBAR DISC SURGERY    . TONSILLECTOMY AND ADENOIDECTOMY    . TOTAL KNEE ARTHROPLASTY Right 07/09/2018  . TOTAL KNEE ARTHROPLASTY Right 07/09/2018   Procedure: RIGHT TOTAL KNEE ARTHROPLASTY;  Surgeon: Marcus Balding, MD;  Location: [facility];  Service: Orthopedics;  Laterality: Right;   Social History   Occupational History    Comment: used to work as an Proofreader; now retired  Tobacco Use  . Smoking status: Former Smoker    Packs/day: 1.00    Years: 20.00    Pack years: 20.00    Quit date: 08/14/1988    Years since quitting: 31.6  . Smokeless tobacco: Never Used  Vaping Use  . Vaping Use: Never used  Substance and Sexual Activity  . Alcohol use: No  . Drug use: No  . Sexual activity: Yes    Birth control/protection: None

## 2020-03-22 ENCOUNTER — Encounter: Payer: Self-pay | Admitting: Physical Medicine and Rehabilitation

## 2020-03-22 ENCOUNTER — Ambulatory Visit: Payer: Medicare Other | Admitting: Physical Medicine and Rehabilitation

## 2020-03-22 ENCOUNTER — Ambulatory Visit: Payer: Self-pay

## 2020-03-22 ENCOUNTER — Other Ambulatory Visit: Payer: Self-pay

## 2020-03-22 VITALS — BP 129/77 | HR 68

## 2020-03-22 DIAGNOSIS — M47816 Spondylosis without myelopathy or radiculopathy, lumbar region: Secondary | ICD-10-CM

## 2020-03-22 MED ORDER — METHYLPREDNISOLONE ACETATE 80 MG/ML IJ SUSP
80.0000 mg | Freq: Once | INTRAMUSCULAR | Status: AC
  Administered 2020-03-22: 80 mg

## 2020-03-22 NOTE — Progress Notes (Signed)
Marcus [facility] - 79 y.o. male MRN [id number]  Date of birth: 1940/10/18  Office Visit Note: Visit Date: 03/22/2020 PCP: [name], DO Referred by: [name], DO  Subjective: Chief Complaint  Patient presents with  . Lower Back - Pain   HPI:  Marcus Taylor is a 79 y.o. male who comes in today at the request of Dr. Joni Fears for planned Bilateral L3-L4 and L4-L5 Lumbar facet/medial branch block with fluoroscopic guidance.  The patient has failed conservative care including home exercise, medications, time and activity modification.  This injection will be diagnostic and hopefully therapeutic.  Please see requesting physician notes for further details and justification.  Exam shows concordant low back pain with facet joint loading and extension.  MRI from 2018 reviewed showing mainly facet arthropathy without stenosis or nerve compression.  He does have some moderate narrowing but no radicular complaints down the legs.  His case is complicated by type 2 diabetes.  We have treated his facet joints in the remote past with good relief.  He is followed by Dr. Durward Fortes and most recently we were looking in his cervical spine.  He did get some relief of his cervical spine pain with epidural injection and is not currently in physical therapy for his shoulder.  He has not had any prior lumbar surgery.  Review of Systems  Musculoskeletal: Positive for back pain.  All other systems reviewed and are negative.  Otherwise per HPI.  Assessment & Plan: Visit Diagnoses:  1. Spondylosis without myelopathy or radiculopathy, lumbar region     Plan: No additional findings.   Meds & Orders:  Meds ordered this encounter  Medications  . methylPREDNISolone acetate (DEPO-MEDROL) injection 80 mg    Orders Placed This Encounter  Procedures  . Facet Injection  . XR C-ARM NO REPORT    Follow-up: Return if symptoms worsen or fail to improve.   Procedures: No procedures performed    Lumbar Diagnostic Facet Joint Nerve Block with Fluoroscopic Guidance   Patient: Marcus Taylor      Date of Birth: 03-May-1941 MRN: [id number] PCP: [name], DO      Visit Date: 03/22/2020   Universal Protocol:    Date/Time: 08/10/216:08 AM  Consent Given By: the patient  Position: PRONE  Additional Comments: Vital signs were monitored before and after the procedure. Patient was prepped and draped in the usual sterile fashion. The correct patient, procedure, and site was verified.   Injection Procedure Details:  Procedure Site One Meds Administered:  Meds ordered this encounter  Medications  . methylPREDNISolone acetate (DEPO-MEDROL) injection 80 mg     Laterality: Bilateral  Location/Site:  L3-L4 L4-L5  Needle size: 22 ga.  Needle type:spinal  Needle Placement: Oblique pedical  Findings:   -Comments: There was excellent flow of contrast along the articular pillars without intravascular flow.  Procedure Details: The fluoroscope beam is vertically oriented in AP and then obliqued 15 to 20 degrees to the ipsilateral side of the desired nerve to achieve the "Scotty dog" appearance.  The skin over the target area of the junction of the superior articulating process and the transverse process (sacral ala if blocking the L5 dorsal rami) was locally anesthetized with a 1 ml volume of 1% Lidocaine without Epinephrine.  The spinal needle was inserted and advanced in a trajectory view down to the target.   After contact with periosteum and negative aspirate for blood and CSF, correct placement without intravascular or epidural  spread was confirmed by injecting 0.5 ml. of Isovue-250.  A spot radiograph was obtained of this image.    Next, a 0.5 ml. volume of the injectate described above was injected. The needle was then redirected to the other facet joint nerves mentioned above if needed.  Prior to the procedure, the patient was given a Pain Diary which was  completed for baseline measurements.  After the procedure, the patient rated their pain every 30 minutes and will continue rating at this frequency for a total of 5 hours.  The patient has been asked to complete the Diary and return to [place] by mail, fax or hand delivered as soon as possible.   Additional Comments:  The patient tolerated the procedure well Dressing: 2 x 2 sterile gauze and Band-Aid    Post-procedure details: Patient was observed during the procedure. Post-procedure instructions were reviewed.  Patient left the clinic in stable condition.    Clinical History: MRI LUMBAR SPINE WITHOUT CONTRAST  TECHNIQUE: Multiplanar, multisequence MR imaging of the lumbar spine was performed. No intravenous contrast was administered.  COMPARISON:  Lumbar spine MRI 02/20/2008  Lumbar spine radiograph 09/22/2016  FINDINGS: Segmentation:  Normal  Alignment: Grade 1 anterolisthesis at L3-L4 and grade 1 retrolisthesis of L4-L5, unchanged  Vertebrae: Unchanged heterogeneous marrow signal with hemangioma in the L3 vertebral body.  Conus medullaris: Extends to the L1 level and appears normal.  Paraspinal and other soft tissues: Right renal cysts measure up to 1.4 cm. Otherwise unremarkable.  Disc levels:  T12-L1: Evaluated on sagittal images only. Inferior T12 and superior L1 endplate Schmorl's nodes. Small disc bulge without spinal canal or neural foraminal stenosis.  L1-L2: Small central disc protrusion without spinal canal or neural foraminal stenosis. This finding is new from the prior study.  L2-L3: Small central disc protrusion and annular fissure, unchanged. Mild narrowing of spinal canal. No neural foraminal stenosis.  L3-L4: Severe disc space narrowing and severe facet hypertrophy. There are postsurgical transient prior laminectomies. Moderate central spinal canal stenosis. Severe bilateral neural foraminal stenosis, unchanged. A component of right  subarticular disc extrusion with inferior migration has decreased compared to the prior study.  L4-L5: Severe loss of disc space with diffuse bulge. Posterior decompression. Unchanged moderate spinal canal stenosis and severe bilateral neural foraminal stenosis.  L5-S1: Disc desiccation with diffuse disc bulge. No spinal canal stenosis. Unchanged severe bilateral neural foraminal stenosis.  Visualized sacrum: Normal.  IMPRESSION: 1. Severe lower lumbar degenerative disc disease with moderate spinal canal stenosis at L3-L4 and L4-L5, unchanged. 2. Partial involution of right subarticular disc extrusion of L3-L4. 3. Severe bilateral L3, L4 and L5 neural foraminal stenosis, unchanged.   Electronically Signed   By: Ulyses Jarred M.D.   On: 10/10/2016 14:22     Objective:  VS:  HT:    WT:   BMI:     BP:129/77  HR:68bpm  TEMP: ( )  RESP:  Physical Exam Constitutional:      General: He is not in acute distress.    Appearance: Normal appearance. He is not ill-appearing.  HENT:     Head: Normocephalic and atraumatic.     Right Ear: External ear normal.     Left Ear: External ear normal.  Eyes:     Extraocular Movements: Extraocular movements intact.  Cardiovascular:     Rate and Rhythm: Normal rate.     Pulses: Normal pulses.  Abdominal:     General: There is no distension.     Palpations: Abdomen is  soft.  Musculoskeletal:        General: No tenderness or signs of injury.     Right lower leg: No edema.     Left lower leg: No edema.     Comments: Patient has good distal strength without clonus. Patient somewhat slow to rise from a seated position to full extension.  There is concordant low back pain with facet loading and lumbar spine extension rotation.  There are no definitive trigger points but the patient is somewhat tender across the lower back and PSIS.  There is no pain with hip rotation.  Skin:    Findings: No erythema or rash.  Neurological:      General: No focal deficit present.     Mental Status: He is alert and oriented to person, place, and time.     Sensory: No sensory deficit.     Motor: No weakness or abnormal muscle tone.     Coordination: Coordination normal.  Psychiatric:        Mood and Affect: Mood normal.        Behavior: Behavior normal.      Imaging: XR C-ARM NO REPORT  Result Date: 03/22/2020 Please see Notes tab for imaging impression.

## 2020-03-22 NOTE — Progress Notes (Signed)
Pt states lower back pain. Pt states if he is doing anything the pain hurt really bad. Pt states he take pain meds to helps. Pt has hx of inj on 03/04/20 the neck inj was good.  Numeric Pain Rating Scale and Functional Assessment Average Pain 2   In the last MONTH (on 0-10 scale) has pain interfered with the following?  1. General activity like being  able to carry out your everyday physical activities such as walking, climbing stairs, carrying groceries, or moving a chair?  Rating(10)   +Driver, -BT, -Dye Allergies.

## 2020-03-22 NOTE — Progress Notes (Signed)
AXTON CIHLAR - 79 y.o. male MRN [id number]  Date of birth: 07/09/1941  Office Visit Note: Visit Date: 03/04/2020 PCP: [name], DO Referred by: [name], DO  Subjective: Chief Complaint  Patient presents with  . Neck - Pain  . Right Shoulder - Pain  . Left Shoulder - Pain   HPI:  Marcus Taylor is a 79 y.o. male who comes in today at the request of Dr. Joni Fears for planned Right C7-T1 Cervical epidural steroid injection with fluoroscopic guidance.  The patient has failed conservative care including home exercise, medications, time and activity modification.  This injection will be diagnostic and hopefully therapeutic.  Please see requesting physician notes for further details and justification.   ROS Otherwise per HPI.  Assessment & Plan: Visit Diagnoses:  1. Cervical radiculopathy     Plan: No additional findings.   Meds & Orders:  Meds ordered this encounter  Medications  . methylPREDNISolone acetate (DEPO-MEDROL) injection 80 mg    Orders Placed This Encounter  Procedures  . XR C-ARM NO REPORT  . Epidural Steroid injection    Follow-up: Return for visit to requesting physician as needed.   Procedures: No procedures performed  Cervical Epidural Steroid Injection - Interlaminar Approach with Fluoroscopic Guidance  Patient: ZAILEN ALBARRAN      Date of Birth: 02/07/1941 MRN: [id number] PCP: [name], DO      Visit Date: 03/04/2020   Universal Protocol:    Date/Time: 08/09/219:19 AM  Consent Given By: the patient  Position: PRONE  Additional Comments: Vital signs were monitored before and after the procedure. Patient was prepped and draped in the usual sterile fashion. The correct patient, procedure, and site was verified.   Injection Procedure Details:  Procedure Site One Meds Administered:  Meds ordered this encounter  Medications  . methylPREDNISolone acetate (DEPO-MEDROL) injection 80 mg     Laterality:  Right  Location/Site: C7-T1  Needle size: 20 G  Needle type: Touhy  Needle Placement: Paramedian epidural space  Findings:  -Comments: Excellent flow of contrast into the epidural space.  Procedure Details: Using a paramedian approach from the side mentioned above, the region overlying the inferior lamina was localized under fluoroscopic visualization and the soft tissues overlying this structure were infiltrated with 4 ml. of 1% Lidocaine without Epinephrine. A # 20 gauge, Tuohy needle was inserted into the epidural space using a paramedian approach.  The epidural space was localized using loss of resistance along with lateral and contralateral oblique bi-planar fluoroscopic views.  After negative aspirate for air, blood, and CSF, a 2 ml. volume of Isovue-250 was injected into the epidural space and the flow of contrast was observed. Radiographs were obtained for documentation purposes.   The injectate was administered into the level noted above.  Additional Comments:  The patient tolerated the procedure well Dressing: 2 x 2 sterile gauze and Band-Aid    Post-procedure details: Patient was observed during the procedure. Post-procedure instructions were reviewed.  Patient left the clinic in stable condition.     Clinical History: 79 year old male with right side neck and shoulder pain for 3 weeks with no known injury.  EXAM: MRI CERVICAL SPINE WITHOUT CONTRAST  FINDINGS: Alignment: Stable from the recent radiographs. Moderate degenerative appearing anterolisthesis of C7 on T1. Mild degenerative anterolisthesis of C4 on C5. Mild superimposed straightening of cervical lordosis.  Vertebrae: Visualized bone marrow signal is within normal limits. Intermittent degenerative endplate marrow signal changes, maximal at C6-C7 and  C7-T1. No marrow edema or evidence of acute osseous abnormality.  Cord: No definite abnormal spinal cord signal despite stenosis with spinal  cord mass effect at C3-C4, details below. The visible upper thoracic spinal cord is within normal limits.  Posterior Fossa, vertebral arteries, paraspinal tissues: Cervicomedullary junction is within normal limits. Negative visible brain parenchyma. Dominant and mildly dolichoectatic left vertebral artery, the right is diminutive. The major vascular flow voids in the neck are preserved.  There is lobular asymmetric and indeterminate appearing signal abnormality at the right thoracic inlet lateral to the right thyroid seen on series 6, image 30. This is not included on the sagittal images. Etiology is unclear, but these seem to be dilated venous vascular structures. No significant regional mass effect.  Disc levels:  C2-C3: Mild to moderate facet hypertrophy greater on the right. Mild broad-based posterior disc bulging. Mild ligament flavum hypertrophy. Mild spinal stenosis. No definite spinal cord mass effect. Moderate bilateral C3 foraminal stenosis.  C3-C4: Mild left but moderate to severe right side facet hypertrophy with severe ligament flavum hypertrophy. Superimposed circumferential disc bulging and endplate spurring with a broad-based posterior component. Spinal stenosis with mild to moderate spinal cord mass effect. Moderate to severe left and severe right C4 foraminal stenosis.  C4-C5: Moderate facet and ligament flavum hypertrophy greater on the right. Superimposed circumferential disc bulge and endplate spurring with a small broad-based central disc protrusion. Mild spinal stenosis with no cord mass effect. Mild to moderate left and moderate to severe right C5 foraminal stenosis.  C5-C6: Disc space loss with bulky but mostly anterior and foraminal circumferential disc osteophyte complex. Mild facet hypertrophy. No significant spinal stenosis. Moderate to severe bilateral C6 foraminal stenosis.  C6-C7: Disc space loss with circumferential disc osteophyte  complex eccentric to the left. Mild ligament flavum hypertrophy. No significant spinal stenosis. Severe left and moderate to severe right C7 foraminal stenosis.  C7-T1: Anterolisthesis with near complete disc space loss. Moderate to severe facet and ligament flavum hypertrophy bilaterally. No significant spinal stenosis, but moderate to severe bilateral C8 foraminal stenosis.  No upper thoracic spinal stenosis.  IMPRESSION: 1. Suggestion of a venous vascular malformation at the right thoracic inlet (series 6, image 30). Ordinarily clinical significance would be doubtful, but in the setting of right shoulder pain recommend further evaluation. Chest CT with IV contrast may be the simplest way to evaluate further. Alternative imaging modalities include right brachial plexus MRI (without and with contrast), right shoulder MRI (larger than standard field-of-view, without and with contrast). 2. Advanced cervical spine degeneration with multilevel spondylolisthesis. No acute osseous abnormality identified. 3. Multifactorial spinal stenosis with up to moderate spinal cord mass effect at C3-C4, but no cord signal abnormality. Mild spinal stenosis at C2-C3, C4-C5. 4. Multifactorial moderate and severe cervical neural foraminal stenosis at all levels.   Electronically Signed   By: Genevie Ann M.D.   On: 03/05/2018 09:39     Objective:  VS:  HT:    WT:   BMI:     BP:(!) 133/73  HR:57bpm  TEMP: ( )  RESP:  Physical Exam Vitals and nursing note reviewed.  Constitutional:      General: He is not in acute distress.    Appearance: Normal appearance. He is not ill-appearing.  HENT:     Head: Normocephalic and atraumatic.     Right Ear: External ear normal.     Left Ear: External ear normal.  Eyes:     Extraocular Movements: Extraocular movements intact.  Cardiovascular:  Rate and Rhythm: Normal rate.     Pulses: Normal pulses.  Abdominal:     General: There is no  distension.     Palpations: Abdomen is soft.  Musculoskeletal:        General: No signs of injury.     Cervical back: Neck supple. Tenderness present. No rigidity.     Right lower leg: No edema.     Left lower leg: No edema.     Comments: Patient has good strength in the upper extremities with 5 out of 5 strength in wrist extension long finger flexion APB.  No intrinsic hand muscle atrophy.  Negative Hoffmann's test.  Lymphadenopathy:     Cervical: No cervical adenopathy.  Skin:    Findings: No erythema or rash.  Neurological:     General: No focal deficit present.     Mental Status: He is alert and oriented to person, place, and time.     Sensory: No sensory deficit.     Motor: No weakness or abnormal muscle tone.     Coordination: Coordination normal.  Psychiatric:        Mood and Affect: Mood normal.        Behavior: Behavior normal.      Imaging: No results found.

## 2020-03-22 NOTE — Procedures (Signed)
Cervical Epidural Steroid Injection - Interlaminar Approach with Fluoroscopic Guidance  Patient: Marcus Taylor      Date of Birth: 08/08/1941 MRN: [id number] PCP: [name], DO      Visit Date: 03/04/2020   Universal Protocol:    Date/Time: 08/09/219:19 AM  Consent Given By: the patient  Position: PRONE  Additional Comments: Vital signs were monitored before and after the procedure. Patient was prepped and draped in the usual sterile fashion. The correct patient, procedure, and site was verified.   Injection Procedure Details:  Procedure Site One Meds Administered:  Meds ordered this encounter  Medications  . methylPREDNISolone acetate (DEPO-MEDROL) injection 80 mg     Laterality: Right  Location/Site: C7-T1  Needle size: 20 G  Needle type: Touhy  Needle Placement: Paramedian epidural space  Findings:  -Comments: Excellent flow of contrast into the epidural space.  Procedure Details: Using a paramedian approach from the side mentioned above, the region overlying the inferior lamina was localized under fluoroscopic visualization and the soft tissues overlying this structure were infiltrated with 4 ml. of 1% Lidocaine without Epinephrine. A # 20 gauge, Tuohy needle was inserted into the epidural space using a paramedian approach.  The epidural space was localized using loss of resistance along with lateral and contralateral oblique bi-planar fluoroscopic views.  After negative aspirate for air, blood, and CSF, a 2 ml. volume of Isovue-250 was injected into the epidural space and the flow of contrast was observed. Radiographs were obtained for documentation purposes.   The injectate was administered into the level noted above.  Additional Comments:  The patient tolerated the procedure well Dressing: 2 x 2 sterile gauze and Band-Aid    Post-procedure details: Patient was observed during the procedure. Post-procedure instructions were reviewed.  Patient  left the clinic in stable condition.

## 2020-03-23 ENCOUNTER — Encounter: Payer: Self-pay | Admitting: Physical Medicine and Rehabilitation

## 2020-03-23 NOTE — Procedures (Signed)
Lumbar Diagnostic Facet Joint Nerve Block with Fluoroscopic Guidance   Patient: Marcus Taylor      Date of Birth: June 19, 1941 MRN: [id number] PCP: [name], DO      Visit Date: 03/22/2020   Universal Protocol:    Date/Time: 08/10/216:08 AM  Consent Given By: the patient  Position: PRONE  Additional Comments: Vital signs were monitored before and after the procedure. Patient was prepped and draped in the usual sterile fashion. The correct patient, procedure, and site was verified.   Injection Procedure Details:  Procedure Site One Meds Administered:  Meds ordered this encounter  Medications  . methylPREDNISolone acetate (DEPO-MEDROL) injection 80 mg     Laterality: Bilateral  Location/Site:  L3-L4 L4-L5  Needle size: 22 ga.  Needle type:spinal  Needle Placement: Oblique pedical  Findings:   -Comments: There was excellent flow of contrast along the articular pillars without intravascular flow.  Procedure Details: The fluoroscope beam is vertically oriented in AP and then obliqued 15 to 20 degrees to the ipsilateral side of the desired nerve to achieve the "Scotty dog" appearance.  The skin over the target area of the junction of the superior articulating process and the transverse process (sacral ala if blocking the L5 dorsal rami) was locally anesthetized with a 1 ml volume of 1% Lidocaine without Epinephrine.  The spinal needle was inserted and advanced in a trajectory view down to the target.   After contact with periosteum and negative aspirate for blood and CSF, correct placement without intravascular or epidural spread was confirmed by injecting 0.5 ml. of Isovue-250.  A spot radiograph was obtained of this image.    Next, a 0.5 ml. volume of the injectate described above was injected. The needle was then redirected to the other facet joint nerves mentioned above if needed.  Prior to the procedure, the patient was given a Pain Diary which was completed  for baseline measurements.  After the procedure, the patient rated their pain every 30 minutes and will continue rating at this frequency for a total of 5 hours.  The patient has been asked to complete the Diary and return to [place] by mail, fax or hand delivered as soon as possible.   Additional Comments:  The patient tolerated the procedure well Dressing: 2 x 2 sterile gauze and Band-Aid    Post-procedure details: Patient was observed during the procedure. Post-procedure instructions were reviewed.  Patient left the clinic in stable condition.

## 2020-03-25 ENCOUNTER — Other Ambulatory Visit: Payer: Self-pay

## 2020-03-25 ENCOUNTER — Ambulatory Visit: Payer: Medicare Other | Admitting: Podiatry

## 2020-03-25 DIAGNOSIS — M79676 Pain in unspecified toe(s): Secondary | ICD-10-CM | POA: Diagnosis not present

## 2020-03-25 DIAGNOSIS — D689 Coagulation defect, unspecified: Secondary | ICD-10-CM

## 2020-03-25 DIAGNOSIS — B351 Tinea unguium: Secondary | ICD-10-CM

## 2020-03-25 NOTE — Progress Notes (Signed)
Subjective: 79 y.o. returns the office today for painful, elongated, thickened toenails which he cannot trim himself. Denies any redness or drainage around the nails. Denies any acute changes since last appointment and no new complaints today. Denies any systemic complaints such as fevers, chills, nausea, vomiting.   PCP: Howard Pouch A, DO Last A1c he reports was 5.7  Objective: AAO 3, NAD DP/PT pulses palpable, CRT less than 3 seconds  Nails hypertrophic, dystrophic, elongated, brittle, discolored 10. There is tenderness overlying the nails 1-5 bilaterally. There is no surrounding erythema or drainage along the nail sites. Dried blood at the distal hallux toenail on the left and after debridement I was able to remove some of the blood. No extension of hyperpigmented skin to the skin.  No open lesions or pre-ulcerative lesions are identified. No other areas of tenderness bilateral lower extremities. No overlying edema, erythema, increased warmth. No pain with calf compression, swelling, warmth, erythema.  Assessment: Patient presents with symptomatic onychomycosis  Plan: -Treatment options including alternatives, risks, complications were discussed -Nails sharply debrided 10 without complication/bleeding. -Discussed daily foot inspection. If there are any changes, to call the office immediately.  -Follow-up in 3 months or sooner if any problems are to arise. In the meantime, encouraged to call the office with any questions, concerns, changes symptoms.  Celesta Gentile, DPM

## 2020-05-03 ENCOUNTER — Other Ambulatory Visit: Payer: Self-pay

## 2020-05-03 ENCOUNTER — Inpatient Hospital Stay: Payer: Medicare Other | Attending: Hematology and Oncology

## 2020-05-03 DIAGNOSIS — Z79899 Other long term (current) drug therapy: Secondary | ICD-10-CM | POA: Insufficient documentation

## 2020-05-03 DIAGNOSIS — D500 Iron deficiency anemia secondary to blood loss (chronic): Secondary | ICD-10-CM

## 2020-05-03 DIAGNOSIS — Z23 Encounter for immunization: Secondary | ICD-10-CM | POA: Diagnosis not present

## 2020-05-03 DIAGNOSIS — Z7984 Long term (current) use of oral hypoglycemic drugs: Secondary | ICD-10-CM | POA: Insufficient documentation

## 2020-05-03 DIAGNOSIS — C884 Extranodal marginal zone B-cell lymphoma of mucosa-associated lymphoid tissue [MALT-lymphoma]: Secondary | ICD-10-CM | POA: Diagnosis not present

## 2020-05-03 DIAGNOSIS — D509 Iron deficiency anemia, unspecified: Secondary | ICD-10-CM | POA: Diagnosis not present

## 2020-05-03 DIAGNOSIS — D630 Anemia in neoplastic disease: Secondary | ICD-10-CM

## 2020-05-03 DIAGNOSIS — C8307 Small cell B-cell lymphoma, spleen: Secondary | ICD-10-CM

## 2020-05-03 LAB — CBC WITH DIFFERENTIAL/PLATELET
Abs Immature Granulocytes: 0.04 10*3/uL (ref 0.00–0.07)
Basophils Absolute: 0.1 10*3/uL (ref 0.0–0.1)
Basophils Relative: 0 %
Eosinophils Absolute: 0.1 10*3/uL (ref 0.0–0.5)
Eosinophils Relative: 1 %
HCT: 38.3 % — ABNORMAL LOW (ref 39.0–52.0)
Hemoglobin: 12.5 g/dL — ABNORMAL LOW (ref 13.0–17.0)
Immature Granulocytes: 0 %
Lymphocytes Relative: 82 %
Lymphs Abs: 16.1 10*3/uL — ABNORMAL HIGH (ref 0.7–4.0)
MCH: 30.3 pg (ref 26.0–34.0)
MCHC: 32.6 g/dL (ref 30.0–36.0)
MCV: 93 fL (ref 80.0–100.0)
Monocytes Absolute: 0.5 10*3/uL (ref 0.1–1.0)
Monocytes Relative: 3 %
Neutro Abs: 2.8 10*3/uL (ref 1.7–7.7)
Neutrophils Relative %: 14 %
Platelets: 152 10*3/uL (ref 150–400)
RBC: 4.12 MIL/uL — ABNORMAL LOW (ref 4.22–5.81)
RDW: 16 % — ABNORMAL HIGH (ref 11.5–15.5)
WBC: 19.7 10*3/uL — ABNORMAL HIGH (ref 4.0–10.5)
nRBC: 0 % (ref 0.0–0.2)

## 2020-05-03 LAB — COMPREHENSIVE METABOLIC PANEL
ALT: 22 U/L (ref 0–44)
AST: 30 U/L (ref 15–41)
Albumin: 3.8 g/dL (ref 3.5–5.0)
Alkaline Phosphatase: 75 U/L (ref 38–126)
Anion gap: 9 (ref 5–15)
BUN: 16 mg/dL (ref 8–23)
CO2: 25 mmol/L (ref 22–32)
Calcium: 8.9 mg/dL (ref 8.9–10.3)
Chloride: 106 mmol/L (ref 98–111)
Creatinine, Ser: 0.76 mg/dL (ref 0.61–1.24)
GFR calc Af Amer: >60 mL/min (ref >60)
GFR calc non Af Amer: >60 mL/min (ref >60)
Glucose, Bld: 177 mg/dL — ABNORMAL HIGH (ref 70–99)
Potassium: 3.8 mmol/L (ref 3.5–5.1)
Sodium: 140 mmol/L (ref 135–145)
Total Bilirubin: 0.5 mg/dL (ref 0.3–1.2)
Total Protein: 6.1 g/dL — ABNORMAL LOW (ref 6.5–8.1)

## 2020-05-03 LAB — IRON AND TIBC
Iron: 57 ug/dL (ref 42–163)
Saturation Ratios: 26 % (ref 20–55)
TIBC: 220 ug/dL (ref 202–409)
UIBC: 163 ug/dL (ref 117–376)

## 2020-05-03 LAB — FERRITIN: Ferritin: 125 ng/mL (ref 24–336)

## 2020-05-03 LAB — SEDIMENTATION RATE: Sed Rate: 0 mm/hr (ref 0–16)

## 2020-05-03 LAB — LACTATE DEHYDROGENASE: LDH: 163 U/L (ref 98–192)

## 2020-05-10 ENCOUNTER — Encounter: Payer: Self-pay | Admitting: Hematology and Oncology

## 2020-05-10 ENCOUNTER — Inpatient Hospital Stay: Payer: Medicare Other

## 2020-05-10 ENCOUNTER — Other Ambulatory Visit: Payer: Self-pay

## 2020-05-10 ENCOUNTER — Inpatient Hospital Stay: Payer: Medicare Other | Admitting: Hematology and Oncology

## 2020-05-10 ENCOUNTER — Telehealth: Payer: Self-pay | Admitting: Hematology and Oncology

## 2020-05-10 VITALS — BP 161/69 | HR 63 | Temp 97.8°F | Resp 18 | Ht 72.0 in | Wt 207.4 lb

## 2020-05-10 DIAGNOSIS — C8307 Small cell B-cell lymphoma, spleen: Secondary | ICD-10-CM

## 2020-05-10 DIAGNOSIS — D500 Iron deficiency anemia secondary to blood loss (chronic): Secondary | ICD-10-CM

## 2020-05-10 DIAGNOSIS — D630 Anemia in neoplastic disease: Secondary | ICD-10-CM

## 2020-05-10 DIAGNOSIS — Z79899 Other long term (current) drug therapy: Secondary | ICD-10-CM | POA: Diagnosis not present

## 2020-05-10 DIAGNOSIS — Z299 Encounter for prophylactic measures, unspecified: Secondary | ICD-10-CM | POA: Diagnosis not present

## 2020-05-10 DIAGNOSIS — Z23 Encounter for immunization: Secondary | ICD-10-CM | POA: Diagnosis not present

## 2020-05-10 DIAGNOSIS — Z7984 Long term (current) use of oral hypoglycemic drugs: Secondary | ICD-10-CM | POA: Diagnosis not present

## 2020-05-10 DIAGNOSIS — C884 Extranodal marginal zone B-cell lymphoma of mucosa-associated lymphoid tissue [MALT-lymphoma]: Secondary | ICD-10-CM | POA: Diagnosis not present

## 2020-05-10 DIAGNOSIS — D509 Iron deficiency anemia, unspecified: Secondary | ICD-10-CM | POA: Diagnosis not present

## 2020-05-10 MED ORDER — INFLUENZA VAC A&B SA ADJ QUAD 0.5 ML IM PRSY
PREFILLED_SYRINGE | INTRAMUSCULAR | Status: AC
  Filled 2020-05-10: qty 0.5

## 2020-05-10 MED ORDER — INFLUENZA VAC A&B SA ADJ QUAD 0.5 ML IM PRSY
0.5000 mL | PREFILLED_SYRINGE | Freq: Once | INTRAMUSCULAR | Status: AC
  Administered 2020-05-10: 0.5 mL via INTRAMUSCULAR

## 2020-05-10 NOTE — Assessment & Plan Note (Signed)
The most likely cause of his anemia is due tochronic blood loss/malabsorption syndrome. He had received 4 doses of intravenous iron earlier this year Repeat iron studies show adequate replacement therapy and stability 4 months away from his last treatment I plan to see him back in 6 months with repeat iron studies

## 2020-05-10 NOTE — Assessment & Plan Note (Signed)
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

## 2020-05-10 NOTE — Assessment & Plan Note (Signed)
We discussed the importance of preventive care and reviewed the vaccination programs. He does not have any prior allergic reactions to influenza vaccination. He agrees to proceed with influenza vaccination today and we will administer it today at the clinic.  We also discussed the importance of getting booster Covid vaccination The patient can get it from local pharmacy so within the month from now

## 2020-05-10 NOTE — Assessment & Plan Note (Signed)
His exam is unremarkable and CBC is stable Due to lack of symptoms, I recommend observation only

## 2020-05-10 NOTE — Telephone Encounter (Signed)
Scheduled appts per 9/27 sch msg. Pt stated he would refer to mychart for AVS and appt details

## 2020-05-10 NOTE — Progress Notes (Signed)
[place] OFFICE PROGRESS NOTE  Patient Care Team: [name], DO as PCP - General (Family Medicine) [name], MD (Inactive) as Consulting Physician (Gastroenterology) [name], MD as Consulting Physician (Hematology and Oncology) [name], MD as Referring Physician (Cardiology) [name], MD as Consulting Physician (Neurosurgery) [name], OD as Referring Physician Garald Balding, MD as Consulting Physician (Orthopedic Surgery) Trula Slade, DPM as Consulting Physician (Podiatry)  ASSESSMENT & PLAN:  Splenic marginal zone b-cell lymphoma [facility]) His exam is unremarkable and CBC is stable Due to lack of symptoms, I recommend observation only  IDA (iron deficiency anemia) The most likely cause of his anemia is due tochronic blood loss/malabsorption syndrome. He had received 4 doses of intravenous iron earlier this year Repeat iron studies show adequate replacement therapy and stability 4 months away from his last treatment I plan to see him back in 6 months with repeat iron studies  Anemia in neoplastic disease He has multifactorial anemia, likely secondary to intermittent iron deficiency and anemia of chronic disease related to his splenic lymphoma With complete iron replacement therapy, his hemoglobin is stable around 12.5 range Observe only for now  Preventive measure We discussed the importance of preventive care and reviewed the vaccination programs. He does not have any prior allergic reactions to influenza vaccination. He agrees to proceed with influenza vaccination today and we will administer it today at the clinic.  We also discussed the importance of getting booster Covid vaccination The patient can get it from local pharmacy so within the month from now   No orders of the defined types were placed in this encounter.   All questions were answered. The patient knows to call the clinic with any problems, questions or  concerns. The total time spent in the appointment was 20 minutes encounter with patients including review of chart and various tests results, discussions about plan of care and coordination of care plan   [name], MD 05/10/2020 9:20 AM  INTERVAL HISTORY: Please see below for problem oriented charting. He returns for further follow-up He has excellent energy level No new infection, fever or chills No new lymphadenopathy The patient denies any recent signs or symptoms of bleeding such as spontaneous epistaxis, hematuria or hematochezia.   SUMMARY OF ONCOLOGIC HISTORY:  This is a very pleasant gentleman who was found to have abnormal CBC  In April 2013, flow cytometry detectable monoclonal B-cell population suspicious for splenic/marginal zone lymphoma. Ultrasound confirmed splenomegaly. Due to lack of symptoms he was being observed. In October 2017, he was noted to have microcytic, iron deficiency anemia. He was started on oral iron supplement On 03/05/2018, the patient had an accidental fall.  CT imaging revealed significant splenomegaly On 03/14/2018, he was started on 20 mg of prednisone daily On April 18, 2018, the dose of prednisone was reduced to 10 mg daily, subsequently taper off In December 2019, CT imaging show chronic liver cirrhosis and splenomegaly without significant lymphadenopathy He received intravenous iron infusion in January 2021 and May for iron deficiency anemia  REVIEW OF SYSTEMS:   Constitutional: Denies fevers, chills or abnormal weight loss Eyes: Denies blurriness of vision Ears, nose, mouth, throat, and face: Denies mucositis or sore throat Respiratory: Denies cough, dyspnea or wheezes Cardiovascular: Denies palpitation, chest discomfort or lower extremity swelling Gastrointestinal:  Denies nausea, heartburn or change in bowel habits Skin: Denies abnormal skin rashes Lymphatics: Denies new lymphadenopathy  Neurological:Denies numbness, tingling or new  weaknesses Behavioral/Psych: Mood  is stable, no new changes  All other systems were reviewed with the patient and are negative.  I have reviewed the past medical history, past surgical history, social history and family history with the patient and they are unchanged from previous note.  ALLERGIES:  has No Known Allergies.  MEDICATIONS:  Current Outpatient Medications  Medication Sig Dispense Refill  . amLODipine (NORVASC) 5 MG tablet Take 1.5 tablets (7.5 mg total) by mouth daily. 135 tablet 1  . aspirin EC 81 MG tablet Take 81 mg by mouth daily.    Marland Kitchen atorvastatin (LIPITOR) 40 MG tablet Take 1 tablet (40 mg total) by mouth daily. 90 tablet 3  . cyclobenzaprine (FLEXERIL) 5 MG tablet Take 1 tablet (5 mg total) by mouth 2 (two) times daily as needed for muscle spasms. 30 tablet 1  . diclofenac Sodium (VOLTAREN) 1 % GEL APPLY 2 GRAMS TO AFFECTED AREA 4 TIMES A DAY 600 g 1  . gabapentin (NEURONTIN) 100 MG capsule Take 1-3 capsules (100-300 mg total) by mouth at bedtime. 90 capsule 1  . lisinopril (ZESTRIL) 40 MG tablet Take 1 tablet (40 mg total) by mouth daily. 90 tablet 1  . metFORMIN (GLUCOPHAGE-XR) 500 MG 24 hr tablet TAKE 1 TABLET BY MOUTH EVERY MONDAY, WEDNESDAY,AND FRIDAY WITH EVENING MEAL 40 tablet 2  . metoprolol tartrate (LOPRESSOR) 25 MG tablet Take 0.5 tablets (12.5 mg total) by mouth 2 (two) times daily. 90 tablet 1  . nitroGLYCERIN (NITROSTAT) 0.4 MG SL tablet Place 1 tablet (0.4 mg total) under the tongue every 5 (five) minutes as needed for chest pain. 10 tablet 0  . traMADol (ULTRAM) 50 MG tablet Take 1 tablet (50 mg total) by mouth every 6 (six) hours as needed. 30 tablet 0  . traMADol (ULTRAM) 50 MG tablet Take 1 tablet (50 mg total) by mouth every 12 (twelve) hours as needed. 30 tablet 1   Current Facility-Administered Medications  Medication Dose Route Frequency Provider Last Rate Last Admin  . 0.9 %  sodium chloride infusion  500 mL Intravenous Once Armbruster, Carlota Raspberry,  MD      . influenza vaccine adjuvanted (FLUAD) injection 0.5 mL  0.5 mL Intramuscular Once [name], MD        PHYSICAL EXAMINATION: ECOG PERFORMANCE STATUS: 1 - Symptomatic but completely ambulatory  Vitals:   05/10/20 0905  BP: (!) 161/69  Pulse: 63  Resp: 18  Temp: 97.8 F (36.6 C)  SpO2: 99%   Filed Weights   05/10/20 0905  Weight: 207 lb 6.4 oz (94.1 kg)    GENERAL:alert, no distress and comfortable SKIN: skin color, texture, turgor are normal, no rashes or significant lesions.  Noted minor skin bruises EYES: normal, Conjunctiva are pink and non-injected, sclera clear OROPHARYNX:no exudate, no erythema and lips, buccal mucosa, and tongue normal  NECK: supple, thyroid normal size, non-tender, without nodularity LYMPH:  no palpable lymphadenopathy in the cervical, axillary or inguinal LUNGS: clear to auscultation and percussion with normal breathing effort HEART: regular rate & rhythm and no murmurs and no lower extremity edema ABDOMEN:abdomen soft, non-tender and normal bowel sounds Musculoskeletal:no cyanosis of digits and no clubbing  NEURO: alert & oriented x 3 with fluent speech, no focal motor/sensory deficits  LABORATORY DATA:  I have reviewed the data as listed    Component Value Date/Time   NA 140 05/03/2020 0857   NA 144 05/31/2015 1404   K 3.8 05/03/2020 0857   K 4.7 05/31/2015 1404   CL 106 05/03/2020  0857   CL 102 10/07/2012 1013   CO2 25 05/03/2020 0857   CO2 29 05/31/2015 1404   GLUCOSE 177 (H) 05/03/2020 0857   GLUCOSE 99 05/31/2015 1404   GLUCOSE 199 (H) 10/07/2012 1013   BUN 16 05/03/2020 0857   BUN 18.3 05/31/2015 1404   CREATININE 0.76 05/03/2020 0857   CREATININE 0.86 03/13/2017 0759   CREATININE 0.9 05/31/2015 1404   CALCIUM 8.9 05/03/2020 0857   CALCIUM 9.2 05/31/2015 1404   PROT 6.1 (L) 05/03/2020 0857   PROT 6.3 (L) 05/31/2015 1404   ALBUMIN 3.8 05/03/2020 0857   ALBUMIN 3.9 05/31/2015 1404   AST 30 05/03/2020 0857   AST 28  05/31/2015 1404   ALT 22 05/03/2020 0857   ALT 23 05/31/2015 1404   ALKPHOS 75 05/03/2020 0857   ALKPHOS 79 05/31/2015 1404   BILITOT 0.5 05/03/2020 0857   BILITOT 0.46 05/31/2015 1404   GFRNONAA >60 05/03/2020 0857   GFRNONAA 85 03/13/2017 0759   GFRAA >60 05/03/2020 0857   GFRAA >89 03/13/2017 0759    No results found for: SPEP, UPEP  Lab Results  Component Value Date   WBC 19.7 (H) 05/03/2020   NEUTROABS 2.8 05/03/2020   HGB 12.5 (L) 05/03/2020   HCT 38.3 (L) 05/03/2020   MCV 93.0 05/03/2020   PLT 152 05/03/2020      Chemistry      Component Value Date/Time   NA 140 05/03/2020 0857   NA 144 05/31/2015 1404   K 3.8 05/03/2020 0857   K 4.7 05/31/2015 1404   CL 106 05/03/2020 0857   CL 102 10/07/2012 1013   CO2 25 05/03/2020 0857   CO2 29 05/31/2015 1404   BUN 16 05/03/2020 0857   BUN 18.3 05/31/2015 1404   CREATININE 0.76 05/03/2020 0857   CREATININE 0.86 03/13/2017 0759   CREATININE 0.9 05/31/2015 1404      Component Value Date/Time   CALCIUM 8.9 05/03/2020 0857   CALCIUM 9.2 05/31/2015 1404   ALKPHOS 75 05/03/2020 0857   ALKPHOS 79 05/31/2015 1404   AST 30 05/03/2020 0857   AST 28 05/31/2015 1404   ALT 22 05/03/2020 0857   ALT 23 05/31/2015 1404   BILITOT 0.5 05/03/2020 0857   BILITOT 0.46 05/31/2015 1404

## 2020-05-20 ENCOUNTER — Encounter: Payer: Self-pay | Admitting: Gastroenterology

## 2020-06-04 ENCOUNTER — Ambulatory Visit (INDEPENDENT_AMBULATORY_CARE_PROVIDER_SITE_OTHER): Payer: Medicare Other | Admitting: Family Medicine

## 2020-06-04 ENCOUNTER — Encounter: Payer: Self-pay | Admitting: Family Medicine

## 2020-06-04 ENCOUNTER — Other Ambulatory Visit: Payer: Self-pay

## 2020-06-04 VITALS — BP 126/63 | HR 62 | Temp 97.7°F | Resp 16 | Ht 72.0 in | Wt 205.2 lb

## 2020-06-04 DIAGNOSIS — E785 Hyperlipidemia, unspecified: Secondary | ICD-10-CM | POA: Diagnosis not present

## 2020-06-04 DIAGNOSIS — I10 Essential (primary) hypertension: Secondary | ICD-10-CM | POA: Diagnosis not present

## 2020-06-04 DIAGNOSIS — E118 Type 2 diabetes mellitus with unspecified complications: Secondary | ICD-10-CM | POA: Diagnosis not present

## 2020-06-04 DIAGNOSIS — D500 Iron deficiency anemia secondary to blood loss (chronic): Secondary | ICD-10-CM | POA: Diagnosis not present

## 2020-06-04 DIAGNOSIS — C8307 Small cell B-cell lymphoma, spleen: Secondary | ICD-10-CM

## 2020-06-04 DIAGNOSIS — I2510 Atherosclerotic heart disease of native coronary artery without angina pectoris: Secondary | ICD-10-CM

## 2020-06-04 LAB — POCT GLYCOSYLATED HEMOGLOBIN (HGB A1C)
HbA1c POC (<> result, manual entry): 5.9 % (ref 4.0–5.6)
HbA1c, POC (controlled diabetic range): 5.9 % (ref 0.0–7.0)
HbA1c, POC (prediabetic range): 5.9 % (ref 5.7–6.4)
Hemoglobin A1C: 5.9 % — AB (ref 4.0–5.6)

## 2020-06-04 MED ORDER — METOPROLOL TARTRATE 25 MG PO TABS: 12.5000 mg | ORAL_TABLET | Freq: Two times a day (BID) | ORAL | 1 refills | Status: DC

## 2020-06-04 MED ORDER — ATORVASTATIN CALCIUM 40 MG PO TABS
40.0000 mg | ORAL_TABLET | Freq: Every day | ORAL | 3 refills | Status: DC
Start: 2020-06-04 — End: 2021-07-01

## 2020-06-04 MED ORDER — LISINOPRIL 40 MG PO TABS: 40.0000 mg | ORAL_TABLET | Freq: Every day | ORAL | 1 refills | Status: DC

## 2020-06-04 MED ORDER — METFORMIN HCL ER 500 MG PO TB24: ORAL_TABLET | ORAL | 2 refills | Status: DC

## 2020-06-04 MED ORDER — AMLODIPINE BESYLATE 5 MG PO TABS
7.5000 mg | ORAL_TABLET | Freq: Every day | ORAL | 1 refills | Status: DC
Start: 2020-06-04 — End: 2020-12-13

## 2020-06-04 NOTE — Patient Instructions (Signed)
All meds called in .  Remember to make eye appt with Dr. [name].   Next appt in 5.5 months.

## 2020-06-04 NOTE — Progress Notes (Signed)
Patient ID: Marcus Taylor, male   DOB: 1941-05-27, 79 y.o.   MRN: [id number]      Patient ID: Marcus Taylor, male  DOB: 07-05-1941, 79 y.o.   MRN: [id number] Patient Care Team    Relationship Specialty Notifications Start End  [name], DO PCP - General Family Medicine  01/26/16   [name], MD (Inactive) Consulting Physician Gastroenterology  01/26/16   [name], MD Consulting Physician Hematology and Oncology  01/26/16   [name], MD Referring Physician Cardiology  01/26/16   [name], MD Consulting Physician Neurosurgery  01/27/16   [name], OD Referring Physician   05/03/16    Comment: opthalmology   Garald Balding, MD Consulting Physician Orthopedic Surgery  06/14/18   Trula Slade, DPM Consulting Physician Podiatry  06/14/18    Chief Complaint  Patient presents with  . Follow-up    [facility], pt is fasting and UTD on all vaccines    Subjective: Marcus Taylor is a 79 y.o. male present for follow up on  Hypertension/H/o MI/CAD/stentsx3/iron deficiency anemia: Pt routinely follows with cardiology at [facility], Dr. [name]. Pt has had stent placement and MI in 2015. He is compliant  Lipitor, lisinopril 40 mg, lopressor 12.5 (BID), amlodipine 7.5 mg qd. He takes a daily baby asa. He is prescribed nitro, he has not needed it.Plavix recently stopped (> 25yr since stent). Patient denies chest pain, shortness of breath, dizziness or lower extremity edema.  Diet: low sodium  Diabetes type 2: Patient reportscomplianceon metformin 500 mg Monday Wednesday and Friday. Patient denies chest pain, shortness of breath, dizziness or lower extremity edema.  A1c: 6.2 --> 6.0>5.7>> 5.9>5.8>6.0>5.9 - Opthalmology: 04/15/2018 [place] Dr. [name]. he is making na appt.  - pna series UTD/completed  - flu shot up-to-date 2021 - foot exam completed 06/04/2020 - urine micro on ACe  iron deficiency anemia:  Iron UTD 04/2020- did not not need transfusion this time.    Cervical radiculopathy: Patient reports he is having more cervical radiculopathy along his right upper neck into his upper shoulder, worsened when laying in bed.  He also has some mild neuropathy in his feet which have been chronic and not worsening. Past Medical History:  Diagnosis Date  . Anemia   . Arthritis   . Cervical radiculopathy 12/22/2019  . Colonic polyp   . Concussion 03/05/2018  . Coronary artery disease    stents x 3 (03-2014)  . Diabetes mellitus   . Diverticulosis    throughout entire colon  . HTN (hypertension)   . Hypertriglyceridemia   . Ingrown toenail 10/21/2019  . Kidney stone   . Lymphocytosis   . Metatarsal fracture left   left 5th proximal phalanx   . Neck pain 02/19/2018  . OSA (obstructive sleep apnea)    "Mild" by records  . PONV (postoperative nausea and vomiting)    first time, no problem since then  . RBBB 01/2016  . Splenic marginal zone b-cell lymphoma ([place]) 12/18/2011  . Splenomegaly   . STEMI (ST elevation myocardial infarction) [facility])    August 2015  . Subarachnoid hematoma ([place]) 03/12/2018  . Subarachnoid hemorrhage ([place]) 03/05/2018   No Known Allergies Past Surgical History:  Procedure Laterality Date  . CARDIAC CATHETERIZATION     stents x 3 (03/2014)  . CHOLECYSTECTOMY    . HERNIA REPAIR    . INGUINAL HERNIA REPAIR Right 05/27/2015   Procedure: LAPAROSCOPIC REPAIR RIGHT  INGUINAL HERNIA;  Surgeon: Greer Pickerel, MD;  Location: [facility];  Service: General;  Laterality: Right;  . INSERTION OF MESH Right 05/27/2015   Procedure: INSERTION OF MESH;  Surgeon: Greer Pickerel, MD;  Location: [facility];  Service: General;  Laterality: Right;  . JOINT REPLACEMENT    . LUMBAR DISC SURGERY    . TONSILLECTOMY AND ADENOIDECTOMY    . TOTAL KNEE ARTHROPLASTY Right 07/09/2018  . TOTAL KNEE ARTHROPLASTY Right 07/09/2018   Procedure: RIGHT TOTAL KNEE ARTHROPLASTY;  Surgeon: Garald Balding, MD;  Location: [facility];  Service: Orthopedics;  Laterality: Right;    Family History  Problem Relation Age of Onset  . Kidney disease Sister   . Colon cancer Sister 68  . Colon cancer Brother   . Bone cancer Brother   . Arthritis Mother   . Breast cancer Mother   . Heart disease Mother   . Early death Father   . Arthritis Son   . Esophageal cancer Sister   . Bladder Cancer Sister   . Rectal cancer Neg Hx   . Stomach cancer Neg Hx    Social History   Socioeconomic History  . Marital status: Married    Spouse name: Not on file  . Number of children: 2  . Years of education: Not on file  . Highest education level: Not on file  Occupational History    Comment: used to work as an Proofreader; now retired  Tobacco Use  . Smoking status: Former Smoker    Packs/day: 1.00    Years: 20.00    Pack years: 20.00    Quit date: 08/14/1988    Years since quitting: 31.8  . Smokeless tobacco: Never Used  Vaping Use  . Vaping Use: Never used  Substance and Sexual Activity  . Alcohol use: No  . Drug use: No  . Sexual activity: Yes    Birth control/protection: None  Other Topics Concern  . Not on file  Social History Narrative   Married to [place].    HS education. Retired.    Former smoker. No drugs or ETOH.   Wears seatbelt.   Wears dentures.   Smoke detector in the home. Firearms  In the home.    Feels safe in his relationships.    Social Determinants of Health   Financial Resource Strain:   . Difficulty of Paying Living Expenses: Not on file  Food Insecurity:   . Worried About Charity fundraiser in the Last Year: Not on file  . Ran Out of Food in the Last Year: Not on file  Transportation Needs:   . Lack of Transportation (Medical): Not on file  . Lack of Transportation (Non-Medical): Not on file  Physical Activity:   . Days of Exercise per Week: Not on file  . Minutes of Exercise per Session: Not on file  Stress:   . Feeling of Stress : Not on file  Social Connections:   . Frequency of Communication with Friends and Family: Not  on file  . Frequency of Social Gatherings with Friends and Family: Not on file  . Attends Religious Services: Not on file  . Active Member of Clubs or Organizations: Not on file  . Attends Archivist Meetings: Not on file  . Marital Status: Not on file  Intimate Partner Violence:   . Fear of Current or Ex-Partner: Not on file  . Emotionally Abused: Not on file  . Physically Abused: Not on file  . Sexually Abused: Not on file   ROS:  Negative, with the exception of above mentioned in HPI  Objective: BP 126/63 (BP Location: Right Arm, Patient Position: Sitting, Cuff Size: Large)   Pulse 62   Temp 97.7 F (36.5 C) (Oral)   Resp 16   Ht 6' (1.829 m)   Wt 205 lb 3.2 oz (93.1 kg)   SpO2 94%   BMI 27.83 kg/m   Gen: Afebrile. No acute distress. Nontoxic. Pleasant male.  HENT: AT. [place]. No cough, no shortness of breath Eyes:Pupils Equal Round Reactive to light, Extraocular movements intact,  Conjunctiva without redness, discharge or icterus. Neck/lymp/endocrine: Supple,no lymphadenopathy, no thyromegaly CV: RRR no murmur, no edema, +2/4 P posterior tibialis pulses Chest: CTAB, no wheeze or crackles Abd: Soft. NTND. BS present. no Masses palpated.  Skin: no rashes, purpura or petechiae.  Neuro: Normal gait. PERLA. EOMi. Alert. Oriented x3 Psych: Normal affect, dress and demeanor. Normal speech. Normal thought content and judgment.  Assessment/plan: Marcus Taylor is a 79 y.o. male present for chronic medical condition follow up.  Iron deficiency anemia:   iron levels UTD 04/2020  Controlled type 2 diabetes mellitus with complication, without long-term current use of insulin (HCC) - Continue metformin 500 mg Monday, Wednesday and Friday only.  A1c: 6.2 --> 6.0>5.7>> 5.9>5.8>6.0> 5.9 today  - Opthalmology: 04/15/2018 [place] Dr. [name]. he is making na appt.  - pna series UTD/completed  - flu shot up-to-date 2021 - foot exam completed 06/04/2020 - urine micro on  ACe  Essential hypertension/Coronary artery disease involving native coronary artery of native heart without angina pectoris/History of ST elevation myocardial infarction (STEMI)/ -Pressures have been mildly elevated at his appointments and at home. - Follows yearly with cardiology.  - Continue ASA - continuelopressor 12.5 twice daily - continue  lisinopril 40 mg daily - continue  amlodipine  1.5 tabs daily (7.5 mg total dose)  - continue  statin 1/half tab alternation every other day - Low sodium diet encouraged. - F/U q 5.5 months  Splenic Margicnal Zone B-cell lymphoma/thrombocytopenia: - continue to follow up with heme/onc.    Orders Placed This Encounter  Procedures  . POCT glycosylated hemoglobin (Hb A1C)   Meds ordered this encounter  Medications  . metoprolol tartrate (LOPRESSOR) 25 MG tablet    Sig: Take 0.5 tablets (12.5 mg total) by mouth 2 (two) times daily.    Dispense:  90 tablet    Refill:  1  . metFORMIN (GLUCOPHAGE-XR) 500 MG 24 hr tablet    Sig: TAKE 1 TABLET BY MOUTH EVERY MONDAY, WEDNESDAY,AND FRIDAY WITH EVENING MEAL    Dispense:  40 tablet    Refill:  2    DX Code Needed  .  . lisinopril (ZESTRIL) 40 MG tablet    Sig: Take 1 tablet (40 mg total) by mouth daily.    Dispense:  90 tablet    Refill:  1  . amLODipine (NORVASC) 5 MG tablet    Sig: Take 1.5 tablets (7.5 mg total) by mouth daily.    Dispense:  135 tablet    Refill:  1  . atorvastatin (LIPITOR) 40 MG tablet    Sig: Take 1 tablet (40 mg total) by mouth daily.    Dispense:  90 tablet    Refill:  3    DC prior scripts for lipitor   Referral Orders  No referral(s) requested today    Electronically signed by: Howard Pouch, [place]

## 2020-06-24 ENCOUNTER — Other Ambulatory Visit: Payer: Self-pay

## 2020-06-24 ENCOUNTER — Ambulatory Visit: Payer: Medicare Other | Admitting: Podiatry

## 2020-06-24 DIAGNOSIS — D689 Coagulation defect, unspecified: Secondary | ICD-10-CM

## 2020-06-24 DIAGNOSIS — B351 Tinea unguium: Secondary | ICD-10-CM

## 2020-06-24 DIAGNOSIS — M79676 Pain in unspecified toe(s): Secondary | ICD-10-CM | POA: Diagnosis not present

## 2020-06-25 NOTE — Progress Notes (Signed)
Subjective: 79 y.o. returns the office today for painful, elongated, thickened toenails which he cannot trim himself. Denies any redness or drainage around the nails. Denies any acute changes since last appointment and no new complaints today. Denies any systemic complaints such as fevers, chills, nausea, vomiting.   PCP: Kuneff, Renee A, DO Last A1c he reports was 5.9 on 06/04/2020   Objective: AAO 3, NAD DP/PT pulses palpable, CRT less than 3 seconds  Nails hypertrophic, dystrophic, elongated, brittle, discolored 10. There is tenderness overlying the nails 1-5 bilaterally. There is no surrounding erythema or drainage along the nail sites. No open lesions or pre-ulcerative lesions are identified. No pain with calf compression, swelling, warmth, erythema.  Assessment: Patient presents with symptomatic onychomycosis  Plan: -Treatment options including alternatives, risks, complications were discussed -Nails sharply debrided 10 without complication/bleeding. -Discussed daily foot inspection. If there are any changes, to call the office immediately.  -Follow-up in 3 months or sooner if any problems are to arise. In the meantime, encouraged to call the office with any questions, concerns, changes symptoms.  Shamiracle Gorden, DPM  

## 2020-08-02 ENCOUNTER — Telehealth (INDEPENDENT_AMBULATORY_CARE_PROVIDER_SITE_OTHER): Payer: Medicare Other | Admitting: Medical

## 2020-08-02 ENCOUNTER — Other Ambulatory Visit: Payer: Self-pay

## 2020-08-02 ENCOUNTER — Telehealth: Payer: Medicare Other | Admitting: Family Medicine

## 2020-08-02 VITALS — BP 144/82 | HR 60

## 2020-08-02 DIAGNOSIS — R059 Cough, unspecified: Secondary | ICD-10-CM | POA: Diagnosis not present

## 2020-08-02 DIAGNOSIS — R0981 Nasal congestion: Secondary | ICD-10-CM | POA: Diagnosis not present

## 2020-08-02 MED ORDER — AZITHROMYCIN 250 MG PO TABS: ORAL_TABLET | ORAL | 0 refills | Status: DC

## 2020-08-02 MED ORDER — BENZONATATE 100 MG PO CAPS: 100.0000 mg | ORAL_CAPSULE | Freq: Three times a day (TID) | ORAL | 0 refills | Status: DC | PRN

## 2020-08-02 MED ORDER — FLUTICASONE PROPIONATE 50 MCG/ACT NA SUSP: 2.0000 | Freq: Every day | NASAL | 1 refills | Status: DC

## 2020-08-02 NOTE — Progress Notes (Signed)
   Subjective:    Patient ID: Marcus Taylor, male    DOB: 07-15-1941, 79 y.o.   MRN: [id number]  HPI  Virtual Visit via Telephone Note  I connected with [name] on 08/02/20 at  2:00 PM EST by telephone and verified that I am speaking with the correct person using two identifiers.  Location: Patient: home Provider: home   I discussed the limitations, risks, security and privacy concerns of performing an evaluation and management service by telephone and the availability of in person appointments. I also discussed with the patient that there may be a patient responsible charge related to this service. The patient expressed understanding and agreed to proceed.   History of Present Illness: Pt has recent dry cough, runny nose and transient lung  irritation like trying to cough up some mucus. Pt states he takes cough drops helps little bit. Symptoms present for 3 days. He had 2 shots of phizer but no booster.  No body aches, no chills, no change in taste or smell.    Pt had covid one time last year about 10 months.      Observations/Objective: General- no acute distress, pleasant, alert and oriented.    Assessment and Plan: You do have 3 days of mild dry cough, runny nose and transient lung  irritation like you feel need to bring up mucus.  You most describe mostly URI type symptoms.  However do recommend caution as we approach the holiday weekend.  Recommend rest, hydration, benzonatate cough tablets and Flonase nasal spray for congestion.  If you have worse signs symptoms indicating bronchitis or sinus infection then start azithromycin.  Sent that prescription to your pharmacy.  You have history of 2 Covid vaccines and also had Covid in the past.  However would recommend that she go ahead and try to get tested with a rapid Covid test as well as a PCR test as we approached the weekend.   Knowing if  positive void be helpful in preventing complications/anticipating  treatment.  Follow-up in 7 to 10 days or as needed.  Follow Up Instructions:    I discussed the assessment and treatment plan with the patient. The patient was provided an opportunity to ask questions and all were answered. The patient agreed with the plan and demonstrated an understanding of the instructions.   The patient was advised to call back or seek an in-person evaluation if the symptoms worsen or if the condition fails to improve as anticipated.  Time spent with patient today was 20  minutes which consisted of chart revdiew, discussing diagnosis, work up treatment and documentation.   Mackie Pai, PA-C   Review of Systems     Objective:   Physical Exam        Assessment & Plan:

## 2020-08-02 NOTE — Patient Instructions (Signed)
You do have 3 days of mild dry cough, runny nose and transient lung  irritation like you feel need to bring up mucus.  You most describe URI type symptoms.  However do recommend caution as we approach the holiday weekend.  Recommend rest, hydration, benzonatate cough tablets and Flonase nasal spray for congestion.  If you have worse signs symptoms indicating bronchitis or sinus infection then start azithromycin.  Sent that prescription to your pharmacy.  You have history of 2 Covid vaccines and also had Covid in the past.  However would recommend that she go ahead and try to get tested with a rapid Covid test as well as a PCR test as we approached the weekend.   Knowing if  positive void be helpful in preventing complications/anticipating treatment.  Follow-up in 7 to 10 days or as needed.

## 2020-08-11 ENCOUNTER — Ambulatory Visit (INDEPENDENT_AMBULATORY_CARE_PROVIDER_SITE_OTHER): Payer: Medicare Other

## 2020-08-11 VITALS — Ht 72.0 in | Wt 205.0 lb

## 2020-08-11 DIAGNOSIS — Z0000 Encounter for general adult medical examination without abnormal findings: Secondary | ICD-10-CM | POA: Diagnosis not present

## 2020-08-11 NOTE — Progress Notes (Signed)
Subjective:   Marcus Taylor is a 79 y.o. male who presents for Medicare Annual/Subsequent preventive examination.   I connected with Marcus Taylor today by telephone and verified that I am speaking with the correct person using two identifiers. Location patient: home Location provider: work Persons participating in the virtual visit: patient, Engineer, civil (consulting)nurse.    I discussed the limitations, risks, security and privacy concerns of performing an evaluation and management service by telephone and the availability of in person appointments. I also discussed with the patient that there may be a patient responsible charge related to this service. The patient expressed understanding and verbally consented to this telephonic visit.    Interactive audio and video telecommunications were attempted between this provider and patient, however failed, due to patient having technical difficulties OR patient did not have access to video capability.  We continued and completed visit with audio only.  Some vital signs may be absent or patient reported.   Time Spent with patient on telephone encounter: 20 minutes  Review of Systems     Cardiac Risk Factors include: none;male gender;advanced age (>[id number], 70>65 women);diabetes mellitus;dyslipidemia;hypertension;sedentary lifestyle     Objective:    Today's Vitals   08/11/20 1330  Weight: 205 lb (93 kg)  Height: 6' (1.829 m)   Body mass index is 27.8 kg/m.  Advanced Directives 08/11/2020 01/09/2020 01/02/2020 09/11/2019 07/09/2018 06/14/2018 03/06/2018  Does Patient Have a Medical Advance Directive? Yes Yes Yes Yes No No Yes  Type of Estate agentAdvance Directive Healthcare Power of [place]Attorney;Living will Healthcare Power of [place]orney;Living will Healthcare Power of [place]orney;Living will Healthcare Power of [place]orney;Living will - - Healthcare Power of [place]orney;Living will  Does patient want to make changes to medical advance directive? - - - - - - No - Patient declined  Copy of  Healthcare Power of Attorney in Chart? No - copy requested No - copy requested No - copy requested No - copy requested - - -  Would patient like information on creating a medical advance directive? - - - - No - Patient declined No - Patient declined -    Current Medications (verified) Outpatient Encounter Medications as of 08/11/2020  Medication Sig  . amLODipine (NORVASC) 5 MG tablet Take 1.5 tablets (7.5 mg total) by mouth daily.  Marland Kitchen. aspirin EC 81 MG tablet Take 81 mg by mouth daily.  Marland Kitchen. atorvastatin (LIPITOR) 40 MG tablet Take 1 tablet (40 mg total) by mouth daily.  Marland Kitchen. azithromycin (ZITHROMAX) 250 MG tablet Take 2 tablets by mouth on day 1, followed by 1 tablet by mouth daily for 4 days.  . benzonatate (TESSALON) 100 MG capsule Take 1 capsule (100 mg total) by mouth 3 (three) times daily as needed.  . diclofenac Sodium (VOLTAREN) 1 % GEL APPLY 2 GRAMS TO AFFECTED AREA 4 TIMES A DAY  . fluticasone (FLONASE) 50 MCG/ACT nasal spray Place 2 sprays into both nostrils daily.  Marland Kitchen. lisinopril (ZESTRIL) 40 MG tablet Take 1 tablet (40 mg total) by mouth daily.  . metFORMIN (GLUCOPHAGE-XR) 500 MG 24 hr tablet TAKE 1 TABLET BY MOUTH EVERY MONDAY, WEDNESDAY,AND FRIDAY WITH EVENING MEAL  . metoprolol tartrate (LOPRESSOR) 25 MG tablet Take 0.5 tablets (12.5 mg total) by mouth 2 (two) times daily.  . nitroGLYCERIN (NITROSTAT) 0.4 MG SL tablet Place 1 tablet (0.4 mg total) under the tongue every 5 (five) minutes as needed for chest pain. (Patient not taking: No sig reported)   Facility-Administered Encounter Medications as of 08/11/2020  Medication  . 0.9 %  sodium chloride infusion    Allergies (verified) Patient has no known allergies.   History: Past Medical History:  Diagnosis Date  . Anemia   . Arthritis   . Cervical radiculopathy 12/22/2019  . Colonic polyp   . Concussion 03/05/2018  . Coronary artery disease    stents x 3 (03-2014)  . Diabetes mellitus   . Diverticulosis    throughout entire  colon  . HTN (hypertension)   . Hypertriglyceridemia   . Ingrown toenail 10/21/2019  . Kidney stone   . Lymphocytosis   . Metatarsal fracture left   left 5th proximal phalanx   . Neck pain 02/19/2018  . OSA (obstructive sleep apnea)    "Mild" by records  . PONV (postoperative nausea and vomiting)    first time, no problem since then  . RBBB 01/2016  . Splenic marginal zone b-cell lymphoma ([place]) 12/18/2011  . Splenomegaly   . STEMI (ST elevation myocardial infarction) [facility])    August 2015  . Subarachnoid hematoma ([place]) 03/12/2018  . Subarachnoid hemorrhage ([place]) 03/05/2018   Past Surgical History:  Procedure Laterality Date  . CARDIAC CATHETERIZATION     stents x 3 (03/2014)  . CHOLECYSTECTOMY    . HERNIA REPAIR    . INGUINAL HERNIA REPAIR Right 05/27/2015   Procedure: LAPAROSCOPIC REPAIR RIGHT  INGUINAL HERNIA;  Surgeon: Greer Pickerel, MD;  Location: [facility];  Service: General;  Laterality: Right;  . INSERTION OF MESH Right 05/27/2015   Procedure: INSERTION OF MESH;  Surgeon: Greer Pickerel, MD;  Location: [facility];  Service: General;  Laterality: Right;  . JOINT REPLACEMENT    . LUMBAR DISC SURGERY    . TONSILLECTOMY AND ADENOIDECTOMY    . TOTAL KNEE ARTHROPLASTY Right 07/09/2018  . TOTAL KNEE ARTHROPLASTY Right 07/09/2018   Procedure: RIGHT TOTAL KNEE ARTHROPLASTY;  Surgeon: Garald Balding, MD;  Location: [place];  Service: Orthopedics;  Laterality: Right;   Family History  Problem Relation Age of Onset  . Kidney disease Sister   . Colon cancer Sister 58  . Colon cancer Brother   . Bone cancer Brother   . Arthritis Mother   . Breast cancer Mother   . Heart disease Mother   . Early death Father   . Arthritis Son   . Esophageal cancer Sister   . Bladder Cancer Sister   . Rectal cancer Neg Hx   . Stomach cancer Neg Hx    Social History   Socioeconomic History  . Marital status: Married    Spouse name: Not on file  . Number of children: 2  . Years of education: Not on file   . Highest education level: Not on file  Occupational History  . 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 Smoking status: Former Smoker    Packs/day: 1.00    Years: 20.00    Pack years: 20.00    Quit date: 08/14/1988    Years since quitting: 32.0  . Smokeless tobacco: Never Used  Vaping Use  . Vaping Use: Never used  Substance and Sexual Activity  . Alcohol use: No  . Drug use: No  . Sexual activity: Yes    Birth control/protection: None  Other Topics Concern  . Not on file  Social History Narrative   Married to [place].    HS education. Retired.    Former smoker. No drugs or ETOH.   Wears seatbelt.   Wears  dentures.   Smoke detector in the home. Firearms  In the home.    Feels safe in his relationships.    Social Determinants of Health   Financial Resource Strain: Low Risk   . Difficulty of Paying Living Expenses: Not hard at all  Food Insecurity: No Food Insecurity  . Worried About Charity fundraiser in the Last Year: Never true  . Ran Out of Food in the Last Year: Never true  Transportation Needs: No Transportation Needs  . Lack of Transportation (Medical): No  . Lack of Transportation (Non-Medical): No  Physical Activity: Inactive  . Days of Exercise per Week: 0 days  . Minutes of Exercise per Session: 0 min  Stress: No Stress Concern Present  . Feeling of Stress : Not at all  Social Connections: Moderately Isolated  . Frequency of Communication with Friends and Family: More than three times a week  . Frequency of Social Gatherings with Friends and Family: More than three times a week  . Attends Religious Services: Never  . Active Member of Clubs or Organizations: No  . Attends Archivist Meetings: Never  . Marital Status: Married    Tobacco Counseling Counseling given: Not Answered   Clinical Intake:  Pre-visit preparation completed: Yes  Pain : No/denies pain     Nutritional Status:  BMI 25 -29 Overweight Nutritional Risks: None Diabetes: Yes CBG done?: No Did pt. bring in CBG monitor from home?: No (phone visit)  How often do you need to have someone help you when you read instructions, pamphlets, or other written materials from your doctor or pharmacy?: 1 - Never What is the last grade level you completed in school?: 12th grade  Diabetic?No  Interpreter Needed?: No  Information entered by :: Caroleen Hamman LPN   Activities of Daily Living In your present state of health, do you have any difficulty performing the following activities: 08/11/2020 06/04/2020  Hearing? N N  Vision? N N  Difficulty concentrating or making decisions? N N  Walking or climbing stairs? N N  Dressing or bathing? N N  Doing errands, shopping? N N  Preparing Food and eating ? N -  Using the Toilet? N -  In the past six months, have you accidently leaked urine? N -  Do you have problems with loss of bowel control? N -  Managing your Medications? N -  Managing your Finances? N -  Housekeeping or managing your Housekeeping? N -  Some recent data might be hidden    Patient Care Team: [name], DO as PCP - General (Family Medicine) [name], MD (Inactive) as Consulting Physician (Gastroenterology) [name], MD as Consulting Physician (Hematology and Oncology) [name], MD as Referring Physician (Cardiology) [name], MD as Consulting Physician (Neurosurgery) [name], OD as Referring Physician Garald Balding, MD as Consulting Physician (Orthopedic Surgery) Trula Slade, DPM as Consulting Physician (Podiatry)  Indicate any recent Medical Services you may have received from other than [facility] providers in the past year (date may be approximate).     Assessment:   This is a routine wellness examination for Marcus Taylor.  Hearing/Vision screen  Hearing Screening   125Hz  250Hz  500Hz  1000Hz  2000Hz  3000Hz  4000Hz  6000Hz  8000Hz   Right ear:            Left ear:           Comments: No issues  Vision Screening Comments: Wears glasses Last eye exam-2020  Dietary issues and  exercise activities discussed:    Goals    . Patient Stated     Maintain current health      Depression Screen PHQ 2/9 Scores 08/11/2020 06/04/2020 06/05/2019 06/14/2018 05/20/2018 03/20/2018 03/12/2018  PHQ - 2 Score 0 0 0 0 0 0 0    Fall Risk Fall Risk  08/11/2020 06/04/2020 06/05/2019 09/27/2018 06/14/2018  Falls in the past year? 0 0 0 1 1  Comment - - - - tripping over laundry  Number falls in past yr: 0 0 - 0 0  Injury with Fall? 0 0 - 1 1  Risk Factor Category  - - - - -  Risk for fall due to : - - Impaired vision;Medication side effect Impaired balance/gait;Impaired vision;Medication side effect -  Follow up Falls prevention discussed Falls evaluation completed Education provided;Falls evaluation completed;Falls prevention discussed Education provided Falls prevention discussed    FALL RISK PREVENTION PERTAINING TO THE HOME:  Any stairs in or around the home? No  Home free of loose throw rugs in walkways, pet beds, electrical cords, etc? Yes  Adequate lighting in your home to reduce risk of falls? Yes   ASSISTIVE DEVICES UTILIZED TO PREVENT FALLS:  Life alert? No  Use of a cane, walker or w/c? No  Grab bars in the bathroom? Yes  Shower chair or bench in shower? No  Elevated toilet seat or a handicapped toilet? No   TIMED UP AND GO:  Was the test performed? No . Phone visit   Cognitive Function:Normal cognitive status assessed by  this Nurse Health Advisor. No abnormalities found.   MMSE - Mini Mental State Exam 06/14/2018  Orientation to time 5  Orientation to Place 5  Registration 3  Attention/ Calculation 5  Recall 0  Language- name 2 objects 2  Language- repeat 1  Language- follow 3 step command 3  Language- read & follow direction 1  Write a sentence 1  Copy design 1  Total score 27        Immunizations Immunization History   Administered Date(s) Administered  . Fluad Quad(high Dose 65+) 04/28/2019, 05/10/2020  . Influenza Split 05/07/2012  . Influenza, High Dose Seasonal PF 05/03/2016, 07/02/2017, 05/20/2018  . Influenza,inj,Quad PF,6+ Mos 09/22/2013, 05/25/2014, 05/31/2015  . PFIZER SARS-COV-2 Vaccination 10/05/2019, 10/29/2019  . Pneumococcal Conjugate-13 09/28/2014  . Pneumococcal Polysaccharide-23 10/05/2011  . Tdap 03/24/2014  . Zoster Recombinat (Shingrix) 05/30/2018, 09/17/2018    TDAP status: Up to date  Flu Vaccine status: Up to date  Pneumococcal vaccine status: Up to date  Covid-19 vaccine status: Completed vaccines  Qualifies for Shingles Vaccine? No   Zostavax completed No   Shingrix Completed?: Yes  Screening Tests Health Maintenance  Topic Date Due  . OPHTHALMOLOGY EXAM  11/13/2019  . COVID-19 Vaccine (3 - Pfizer risk 4-dose series) 11/26/2019  . COLONOSCOPY (Pts 45-52yrs Insurance coverage will need to be confirmed)  04/18/2020  . Hepatitis C Screening  06/04/2021 (Originally Nov 11, 1940)  . HEMOGLOBIN A1C  12/03/2020  . FOOT EXAM  06/04/2021  . TETANUS/TDAP  03/24/2024  . INFLUENZA VACCINE  Completed  . PNA vac Low Risk Adult  Completed    Health Maintenance  Health Maintenance Due  Topic Date Due  . OPHTHALMOLOGY EXAM  11/13/2019  . COVID-19 Vaccine (3 - Pfizer risk 4-dose series) 11/26/2019  . COLONOSCOPY (Pts 45-33yrs Insurance coverage will need to be confirmed)  04/18/2020    Colorectal cancer screening: No longer required.   Lung Cancer Screening: (Low Dose CT Chest  recommended if Age 70-80 years, 51 pack-year currently smoking OR have quit w/in 15years.) does not qualify.     Additional Screening:  Hepatitis C Screening: Does not qualify  Vision Screening: Recommended annual ophthalmology exams for early detection of glaucoma and other disorders of the eye. Is the patient up to date with their annual eye exam?  No  Who is the provider or what is the name  of the office in which the patient attends annual eye exams? [facility] Screening: Recommended annual dental exams for proper oral hygiene  Community Resource Referral / Chronic Care Management: CRR required this visit?  No   CCM required this visit?  No      Plan:     I have personally reviewed and noted the following in the patient's chart:   . Medical and social history . Use of alcohol, tobacco or illicit drugs  . Current medications and supplements . Functional ability and status . Nutritional status . Physical activity . Advanced directives . List of other physicians . Hospitalizations, surgeries, and ER visits in previous 12 months . Vitals . Screenings to include cognitive, depression, and falls . Referrals and appointments  In addition, I have reviewed and discussed with patient certain preventive protocols, quality metrics, and best practice recommendations. A written personalized care plan for preventive services as well as general preventive health recommendations were provided to patient.   Due to this being a telephonic visit, the after visit summary with patients personalized plan was offered to patient via mail or my-chart. Patient would like to access on my-chart.  Marta Antu, LPN   [id number]  Nurse Health Advisor  Nurse Notes: None

## 2020-08-11 NOTE — Patient Instructions (Signed)
Marcus Taylor , Thank you for taking time to complete your Medicare Wellness Visit. I appreciate your ongoing commitment to your health goals. Please review the following plan we discussed and let me know if I can assist you in the future.   Screening recommendations/referrals: Colonoscopy: Screening colonoscopy no longer required. Per our conversation, discuss with GI. Recommended yearly ophthalmology/optometry visit for glaucoma screening and checkup Recommended yearly dental visit for hygiene and checkup  Vaccinations: Influenza vaccine: Up to date Pneumococcal vaccine: Completed vaccines Tdap vaccine: Up to date-Due-03/24/2024 Shingles vaccine: Completed vaccines  Covid-19: Completed vaccines  Advanced directives: Please bring a copy for your chart  Conditions/risks identified: See problem list  Next appointment: Follow up in one year for your annual wellness visit. 08/17/2021 @ 9:00am  Preventive Care 65 Years and Older, Male Preventive care refers to lifestyle choices and visits with your health care provider that can promote health and wellness. What does preventive care include?  A yearly physical exam. This is also called an annual well check.  Dental exams once or twice a year.  Routine eye exams. Ask your health care provider how often you should have your eyes checked.  Personal lifestyle choices, including:  Daily care of your teeth and gums.  Regular physical activity.  Eating a healthy diet.  Avoiding tobacco and drug use.  Limiting alcohol use.  Practicing safe sex.  Taking low doses of aspirin every day.  Taking vitamin and mineral supplements as recommended by your health care provider. What happens during an annual well check? The services and screenings done by your health care provider during your annual well check will depend on your age, overall health, lifestyle risk factors, and family history of disease. Counseling  Your health care provider  may ask you questions about your:  Alcohol use.  Tobacco use.  Drug use.  Emotional well-being.  Home and relationship well-being.  Sexual activity.  Eating habits.  History of falls.  Memory and ability to understand (cognition).  Work and work Statistician. Screening  You may have the following tests or measurements:  Height, weight, and BMI.  Blood pressure.  Lipid and cholesterol levels. These may be checked every 5 years, or more frequently if you are over 81 years old.  Skin check.  Lung cancer screening. You may have this screening every year starting at age 41 if you have a 30-pack-year history of smoking and currently smoke or have quit within the past 15 years.  Fecal occult blood test (FOBT) of the stool. You may have this test every year starting at age 53.  Flexible sigmoidoscopy or colonoscopy. You may have a sigmoidoscopy every 5 years or a colonoscopy every 10 years starting at age 33.  Prostate cancer screening. Recommendations will vary depending on your family history and other risks.  Hepatitis C blood test.  Hepatitis B blood test.  Sexually transmitted disease (STD) testing.  Diabetes screening. This is done by checking your blood sugar (glucose) after you have not eaten for a while (fasting). You may have this done every 1-3 years.  Abdominal aortic aneurysm (AAA) screening. You may need this if you are a current or former smoker.  Osteoporosis. You may be screened starting at age 9 if you are at high risk. Talk with your health care provider about your test results, treatment options, and if necessary, the need for more tests. Vaccines  Your health care provider may recommend certain vaccines, such as:  Influenza vaccine. This is recommended every  year.  Tetanus, diphtheria, and acellular pertussis (Tdap, Td) vaccine. You may need a Td booster every 10 years.  Zoster vaccine. You may need this after age 94.  Pneumococcal 13-valent  conjugate (PCV13) vaccine. One dose is recommended after age 44.  Pneumococcal polysaccharide (PPSV23) vaccine. One dose is recommended after age 35. Talk to your health care provider about which screenings and vaccines you need and how often you need them. This information is not intended to replace advice given to you by your health care provider. Make sure you discuss any questions you have with your health care provider. Document Released: 08/27/2015 Document Revised: 04/19/2016 Document Reviewed: 06/01/2015 Elsevier Interactive Patient Education  2017 [employer] Prevention in the Home Falls can cause injuries. They can happen to people of all ages. There are many things you can do to make your home safe and to help prevent falls. What can I do on the outside of my home?  Regularly fix the edges of walkways and driveways and fix any cracks.  Remove anything that might make you trip as you walk through a door, such as a raised step or threshold.  Trim any bushes or trees on the path to your home.  Use bright outdoor lighting.  Clear any walking paths of anything that might make someone trip, such as rocks or tools.  Regularly check to see if handrails are loose or broken. Make sure that both sides of any steps have handrails.  Any raised decks and porches should have guardrails on the edges.  Have any leaves, snow, or ice cleared regularly.  Use sand or salt on walking paths during winter.  Clean up any spills in your garage right away. This includes oil or grease spills. What can I do in the bathroom?  Use night lights.  Install grab bars by the toilet and in the tub and shower. Do not use towel bars as grab bars.  Use non-skid mats or decals in the tub or shower.  If you need to sit down in the shower, use a plastic, non-slip stool.  Keep the floor dry. Clean up any water that spills on the floor as soon as it happens.  Remove soap buildup in the tub or  shower regularly.  Attach bath mats securely with double-sided non-slip rug tape.  Do not have throw rugs and other things on the floor that can make you trip. What can I do in the bedroom?  Use night lights.  Make sure that you have a light by your bed that is easy to reach.  Do not use any sheets or blankets that are too big for your bed. They should not hang down onto the floor.  Have a firm chair that has side arms. You can use this for support while you get dressed.  Do not have throw rugs and other things on the floor that can make you trip. What can I do in the kitchen?  Clean up any spills right away.  Avoid walking on wet floors.  Keep items that you use a lot in easy-to-reach places.  If you need to reach something above you, use a strong step stool that has a grab bar.  Keep electrical cords out of the way.  Do not use floor polish or wax that makes floors slippery. If you must use wax, use non-skid floor wax.  Do not have throw rugs and other things on the floor that can make you trip. What can  I do with my stairs?  Do not leave any items on the stairs.  Make sure that there are handrails on both sides of the stairs and use them. Fix handrails that are broken or loose. Make sure that handrails are as long as the stairways.  Check any carpeting to make sure that it is firmly attached to the stairs. Fix any carpet that is loose or worn.  Avoid having throw rugs at the top or bottom of the stairs. If you do have throw rugs, attach them to the floor with carpet tape.  Make sure that you have a light switch at the top of the stairs and the bottom of the stairs. If you do not have them, ask someone to add them for you. What else can I do to help prevent falls?  Wear shoes that:  Do not have high heels.  Have rubber bottoms.  Are comfortable and fit you well.  Are closed at the toe. Do not wear sandals.  If you use a stepladder:  Make sure that it is fully  opened. Do not climb a closed stepladder.  Make sure that both sides of the stepladder are locked into place.  Ask someone to hold it for you, if possible.  Clearly mark and make sure that you can see:  Any grab bars or handrails.  First and last steps.  Where the edge of each step is.  Use tools that help you move around (mobility aids) if they are needed. These include:  Canes.  Walkers.  Scooters.  Crutches.  Turn on the lights when you go into a dark area. Replace any light bulbs as soon as they burn out.  Set up your furniture so you have a clear path. Avoid moving your furniture around.  If any of your floors are uneven, fix them.  If there are any pets around you, be aware of where they are.  Review your medicines with your doctor. Some medicines can make you feel dizzy. This can increase your chance of falling. Ask your doctor what other things that you can do to help prevent falls. This information is not intended to replace advice given to you by your health care provider. Make sure you discuss any questions you have with your health care provider. Document Released: 05/27/2009 Document Revised: 01/06/2016 Document Reviewed: 09/04/2014 Elsevier Interactive Patient Education  2017 [employer].

## 2020-08-24 ENCOUNTER — Other Ambulatory Visit: Payer: Self-pay | Admitting: Medical

## 2020-09-22 ENCOUNTER — Other Ambulatory Visit: Payer: Self-pay | Admitting: *Deleted

## 2020-09-27 ENCOUNTER — Other Ambulatory Visit: Payer: Self-pay

## 2020-09-27 ENCOUNTER — Ambulatory Visit: Payer: Medicare Other | Admitting: Podiatry

## 2020-09-27 DIAGNOSIS — B351 Tinea unguium: Secondary | ICD-10-CM | POA: Diagnosis not present

## 2020-09-27 DIAGNOSIS — M79676 Pain in unspecified toe(s): Secondary | ICD-10-CM

## 2020-09-27 DIAGNOSIS — D689 Coagulation defect, unspecified: Secondary | ICD-10-CM

## 2020-09-29 NOTE — Progress Notes (Signed)
Subjective: 80 y.o. returns the office today for painful, elongated, thickened toenails which he cannot trim himself. Denies any redness or drainage around the nails. Denies any acute changes since last appointment and no new complaints today. Denies any systemic complaints such as fevers, chills, nausea, vomiting.   PCP: Howard Pouch A, DO Last A1c he reports was 5.9 on 06/04/2020   Objective: AAO 3, NAD DP/PT pulses palpable, CRT less than 3 seconds  Nails hypertrophic, dystrophic, elongated, brittle, discolored 10. There is tenderness overlying the nails 1-5 bilaterally. There is no surrounding erythema or drainage along the nail sites. No open lesions or pre-ulcerative lesions are identified. No pain with calf compression, swelling, warmth, erythema.  Assessment: Patient presents with symptomatic onychomycosis  Plan: -Treatment options including alternatives, risks, complications were discussed -Nails sharply debrided 10 without complication/bleeding. -Discussed daily foot inspection. If there are any changes, to call the office immediately.  -Follow-up in 3 months or sooner if any problems are to arise. In the meantime, encouraged to call the office with any questions, concerns, changes symptoms.  Celesta Gentile, DPM

## 2020-10-26 DIAGNOSIS — Z7984 Long term (current) use of oral hypoglycemic drugs: Secondary | ICD-10-CM | POA: Diagnosis not present

## 2020-10-26 DIAGNOSIS — E119 Type 2 diabetes mellitus without complications: Secondary | ICD-10-CM | POA: Diagnosis not present

## 2020-10-26 DIAGNOSIS — H2513 Age-related nuclear cataract, bilateral: Secondary | ICD-10-CM | POA: Diagnosis not present

## 2020-10-26 LAB — HM DIABETES EYE EXAM

## 2020-11-01 ENCOUNTER — Other Ambulatory Visit: Payer: Self-pay

## 2020-11-02 ENCOUNTER — Ambulatory Visit
Admission: RE | Admit: 2020-11-02 | Discharge: 2020-11-02 | Disposition: A | Discharge status: Home / Self Care | Payer: Medicare Other | Source: Ambulatory Visit | Attending: Family Medicine | Admitting: Family Medicine

## 2020-11-02 ENCOUNTER — Ambulatory Visit (INDEPENDENT_AMBULATORY_CARE_PROVIDER_SITE_OTHER): Payer: Medicare Other | Admitting: Family Medicine

## 2020-11-02 ENCOUNTER — Encounter: Payer: Self-pay | Admitting: Family Medicine

## 2020-11-02 VITALS — BP 133/71 | HR 59 | Temp 97.8°F | Ht 72.0 in | Wt 209.0 lb

## 2020-11-02 DIAGNOSIS — S76302A Unspecified injury of muscle, fascia and tendon of the posterior muscle group at thigh level, left thigh, initial encounter: Secondary | ICD-10-CM | POA: Insufficient documentation

## 2020-11-02 DIAGNOSIS — M7918 Myalgia, other site: Secondary | ICD-10-CM | POA: Diagnosis not present

## 2020-11-02 DIAGNOSIS — M160 Bilateral primary osteoarthritis of hip: Secondary | ICD-10-CM | POA: Diagnosis not present

## 2020-11-02 DIAGNOSIS — S76012A Strain of muscle, fascia and tendon of left hip, initial encounter: Secondary | ICD-10-CM | POA: Diagnosis not present

## 2020-11-02 DIAGNOSIS — M533 Sacrococcygeal disorders, not elsewhere classified: Secondary | ICD-10-CM | POA: Diagnosis not present

## 2020-11-02 HISTORY — DX: Unspecified injury of muscle, fascia and tendon of the posterior muscle group at thigh level, left thigh, initial encounter: S76.302A

## 2020-11-02 MED ORDER — PREDNISONE 20 MG PO TABS: ORAL_TABLET | ORAL | 0 refills | Status: DC

## 2020-11-02 NOTE — Progress Notes (Signed)
This visit occurred during the SARS-CoV-2 public health emergency.  Safety protocols were in place, including screening questions prior to the visit, additional usage of staff PPE, and extensive cleaning of exam room while observing appropriate contact time as indicated for disinfecting solutions.    Marcus Taylor , February 12, 1941, 80 y.o., male MRN: [id number] Patient Care Team    Relationship Specialty Notifications Start End  [name], DO PCP - General Family Medicine  01/26/16   [name], MD (Inactive) Consulting Physician Gastroenterology  01/26/16   [name], MD Consulting Physician Hematology and Oncology  01/26/16   [name], MD Referring Physician Cardiology  01/26/16   [name], MD Consulting Physician Neurosurgery  01/27/16   [name], OD Referring Physician   05/03/16    Comment: opthalmology   Garald Balding, MD Consulting Physician Orthopedic Surgery  06/14/18   Trula Slade, DPM Consulting Physician Podiatry  06/14/18     Chief Complaint  Patient presents with  . Hip Pain    Pt c/o left hip that radiates to knee x 3 week     Subjective: Pt presents for an OV with complaints of left leg and buttock pain since injury 3 weeks ago, when he was standing and turned to his left. He reports his foot did not move and he felt a pain in his thigh and left buttock. He feels like there is a "knot" near the bottom of his buttock that hurts when he sits on it. His left leg does not feel strong to him and he feels like he has trouble placing weight on it. His leg has not given out, he states he can walk, the sensation of weakness is present. He has a picture of his posterior thigh with bruising from proximal to distal thigh and along medial distal thigh above knee.  He felt he was improving, until he tripped over a throw rug Saturday and pain recurred.  H/o arthritis bilateral hips R>L.Pt has tried tylenol to ease their symptoms.   Xray pelvis  09/22/2016: An AP the pelvis was obtained demonstrating significant degenerative  changes at L4-5 and L5-S1 with decrease in the disc space height and  considerable sclerosis within the vertebral body endplates. Decreased bone  density. There are multiple small cysts in both femoral heads with a  decrease in the joint space more on the right than the left. There is some  vascular calcification. Considerable bowel gas obliterates much of the  details. Findings are consistent with osteoarthritis in both hips slightly  more on the right than the left  Depression screen [facility] 2/9 11/02/2020 08/11/2020 06/04/2020 06/05/2019 06/14/2018  Decreased Interest 0 0 0 0 0  Down, Depressed, Hopeless 0 0 0 0 0  PHQ - 2 Score 0 0 0 0 0  Some recent data might be hidden   No Known Allergies Social History   Social History Narrative   Married to [place].    HS education. Retired.    Former smoker. No drugs or ETOH.   Wears seatbelt.   Wears dentures.   Smoke detector in the home. Firearms  In the home.    Feels safe in his relationships.    Past Medical History:  Diagnosis Date  . Anemia   . Arthritis   . Cervical radiculopathy 12/22/2019  . Colonic polyp   . Concussion 03/05/2018  . Coronary artery disease    stents x 3 (03-2014)  . Diabetes mellitus   .  Diverticulosis    throughout entire colon  . HTN (hypertension)   . Hypertriglyceridemia   . Ingrown toenail 10/21/2019  . Kidney stone   . Lymphocytosis   . Metatarsal fracture left   left 5th proximal phalanx   . Neck pain 02/19/2018  . OSA (obstructive sleep apnea)    "Mild" by records  . PONV (postoperative nausea and vomiting)    first time, no problem since then  . RBBB 01/2016  . Splenic marginal zone b-cell lymphoma ([place]) 12/18/2011  . Splenomegaly   . STEMI (ST elevation myocardial infarction) [facility])    August 2015  . Subarachnoid hematoma ([place]) 03/12/2018  . Subarachnoid hemorrhage ([place]) 03/05/2018   Past Surgical History:   Procedure Laterality Date  . CARDIAC CATHETERIZATION     stents x 3 (03/2014)  . CHOLECYSTECTOMY    . HERNIA REPAIR    . INGUINAL HERNIA REPAIR Right 05/27/2015   Procedure: LAPAROSCOPIC REPAIR RIGHT  INGUINAL HERNIA;  Surgeon: Greer Pickerel, MD;  Location: [facility];  Service: General;  Laterality: Right;  . INSERTION OF MESH Right 05/27/2015   Procedure: INSERTION OF MESH;  Surgeon: Greer Pickerel, MD;  Location: [facility];  Service: General;  Laterality: Right;  . JOINT REPLACEMENT    . LUMBAR DISC SURGERY    . TONSILLECTOMY AND ADENOIDECTOMY    . TOTAL KNEE ARTHROPLASTY Right 07/09/2018  . TOTAL KNEE ARTHROPLASTY Right 07/09/2018   Procedure: RIGHT TOTAL KNEE ARTHROPLASTY;  Surgeon: Garald Balding, MD;  Location: [facility];  Service: Orthopedics;  Laterality: Right;   Family History  Problem Relation Age of Onset  . Kidney disease Sister   . Colon cancer Sister 54  . Colon cancer Brother   . Bone cancer Brother   . Arthritis Mother   . Breast cancer Mother   . Heart disease Mother   . Early death Father   . Arthritis Son   . Esophageal cancer Sister   . Bladder Cancer Sister   . Rectal cancer Neg Hx   . Stomach cancer Neg Hx    Allergies as of 11/02/2020   No Known Allergies     Medication List       Accurate as of November 02, 2020 11:02 AM. If you have any questions, ask your nurse or doctor.        STOP taking these medications   azithromycin 250 MG tablet Commonly known as: ZITHROMAX Stopped by: Howard Pouch, DO   benzonatate 100 MG capsule Commonly known as: TESSALON Stopped by: Howard Pouch, DO     TAKE these medications   amLODipine 5 MG tablet Commonly known as: NORVASC Take 1.5 tablets (7.5 mg total) by mouth daily.   aspirin EC 81 MG tablet Take 81 mg by mouth daily.   atorvastatin 40 MG tablet Commonly known as: LIPITOR Take 1 tablet (40 mg total) by mouth daily.   diclofenac Sodium 1 % Gel Commonly known as: VOLTAREN APPLY 2 GRAMS TO AFFECTED AREA  4 TIMES A DAY   fluticasone 50 MCG/ACT nasal spray Commonly known as: FLONASE SPRAY 2 SPRAYS INTO EACH NOSTRIL EVERY DAY   lisinopril 40 MG tablet Commonly known as: ZESTRIL Take 1 tablet (40 mg total) by mouth daily.   metFORMIN 500 MG 24 hr tablet Commonly known as: GLUCOPHAGE-XR TAKE 1 TABLET BY MOUTH EVERY MONDAY, WEDNESDAY,AND FRIDAY WITH EVENING MEAL   metoprolol tartrate 25 MG tablet Commonly known as: LOPRESSOR Take 0.5 tablets (12.5 mg total) by mouth 2 (two) times  daily.   nitroGLYCERIN 0.4 MG SL tablet Commonly known as: NITROSTAT Place 1 tablet (0.4 mg total) under the tongue every 5 (five) minutes as needed for chest pain.       All past medical history, surgical history, allergies, family history, immunizations andmedications were updated in the EMR today and reviewed under the history and medication portions of their EMR.     ROS: Negative, with the exception of above mentioned in HPI  Objective:  BP 133/71   Pulse (!) 59   Temp 97.8 F (36.6 C) (Oral)   Ht 6' (1.829 m)   Wt 209 lb (94.8 kg)   SpO2 99%   BMI 28.35 kg/m  Body mass index is 28.35 kg/m. Gen: Afebrile. No acute distress. Nontoxic in appearance, well developed, well nourished.  HENT: AT. [place]. Eyes:Pupils Equal Round Reactive to light, Extraocular movements intact,  Conjunctiva without redness, discharge or icterus. MSK (left lower extremity): bruising present posterior and medial left thigh.  Tender to palpation lower left buttock over bony prominence and hamstring muscle bellies.  No tenderness to palpation bony prominence of femur proximally or distally.  No hip joint pain on range of motion.  Muscle strength 5/5 lower extremity bilaterally with pain left hip flexion and left knee extension of lower extremity.  Weakness and pain with resisted abduction/abduction of left lower extremity and posterior/hip extension.  Neurovascular intact distally. Skin: bruising present posterior and medial left  thigh.  Neuro:PERLA. EOMi. Alert. Oriented x3  Psych: Normal affect, dress and demeanor. Normal speech. Normal thought content and judgment.  No exam data present No results found. No results found for this or any previous visit (from the past 24 hour(s)).  Assessment/Plan: ARVO EALY is a 80 y.o. male present for OV for  Left hamstring injury/tear/left gluteus injury-sprain left buttock pain Suspect hamstring tear and possible gluteus strain or tear.  Concern over possible hamstring/pelvic bone avulsion injury given exam and HPI. Discussed referral to sports med for further evaluation and recommendations. X-ray today of pelvis-if avulsion is present will refer to orthopedics. Patient declined muscle relaxer and pain medication today. Prednisone taper to help with inflammatory response was provided. He would like to continue Tylenol for his pain. - Ambulatory referral to Sports Medicine - DG Pelvis 1-2 Views Follow-up dependent upon imaging results   Reviewed expectations re: course of current medical issues.  Discussed self-management of symptoms.  Outlined signs and symptoms indicating need for more acute intervention.  Patient verbalized understanding and all questions were answered.  Patient received an After-Visit Summary.    No orders of the defined types were placed in this encounter.  No orders of the defined types were placed in this encounter.  Referral Orders  No referral(s) requested today     Note is dictated utilizing voice recognition software. Although note has been proof read prior to signing, occasional typographical errors still can be missed. If any questions arise, please do not hesitate to call for verification.   electronically signed by:  Howard Pouch, DO  [place]

## 2020-11-02 NOTE — Patient Instructions (Signed)
Hamstring Strain  A hamstring strain happens when the muscles in the back of the thighs (hamstring muscles) are overstretched or torn. The hamstring muscles are used in straightening the hips, bending the knees, and pulling back the legs. This injury is often called a pulled hamstring muscle. The tissue that connects the muscle to a bone (tendon) may also be affected. The severity of a hamstring strain may be rated in degrees or grades. First-degree (or grade 1) strains have the least amount of muscle tearing and pain. Second-degree and third-degree (grade 2 and 3) strains have increasingly more tearing and pain. What are the causes? This condition is caused by a sudden, violent force being placed on the hamstring muscles, stretching them too far. This often happens during activities that involve running, jumping, kicking, or weight lifting. What increases the risk? Hamstring strains are especially common in athletes. The following factors may also make you more likely to develop this condition:  Having low strength, endurance, or flexibility of the hamstring muscles.  Doing high-impact physical activity or sports.  Having poor physical fitness.  Having a previous leg injury.  Having tired (fatigued) muscles. What are the signs or symptoms? Symptoms of this condition include:  Pain in the back of the thigh.  Swelling.  Bruising.  Muscle spasms.  Trouble moving the affected muscle because of pain. For severe strains, you may feel popping or snapping in the back of your thigh when the injury occurs. How is this diagnosed? This condition is diagnosed based on your symptoms, your medical history, and a physical exam. How is this treated? Treatment for this condition usually involves:  Protecting, resting, icing, applying compression, and elevating the injured area (PRICE therapy).  Medicines. Your health care provider may recommend medicines to help reduce pain or  inflammation.  Doing exercises to regain strength and flexibility in the muscles. Your health care provider will tell you when it is okay to begin exercising. Follow these instructions at home: PRICE therapy Use PRICE therapy to promote muscle healing during the first 2-3 days after your injury, or as told by your health care provider.  Protect the muscle from being injured again.  Rest your injury. This usually involves limiting your normal activities and not using the injured hamstring muscle. Talk with your health care provider about how you should limit your activities.  Apply ice to the injured area: ? Put ice in a plastic bag. ? Place a towel between your skin and the bag. ? Leave the ice on for 20 minutes, 2-3 times a day. After the third day, switch to applying heat as told.  Put pressure (compression) on your injured hamstring by wrapping it with an elastic bandage. Be careful not to wrap it too tightly. That may interfere with blood circulation or may increase swelling.  Raise (elevate) your injured hamstring above the level of your heart as often as possible. When you are lying down, you can do this by putting a pillow under your thigh.   Activity  Begin exercising or stretching only as told by your health care provider.  Do not return to full activity level until your health care provider approves.  To help prevent muscle strains in the future, always warm up before exercising and stretch afterward. General instructions  Take over-the-counter and prescription medicines only as told by your health care provider.  If directed, apply heat to the affected area as often as told by your health care provider. Use the heat source that   your health care provider recommends, such as a moist heat pack or a heating pad. ? Place a towel between your skin and the heat source. ? Leave the heat on for 20-30 minutes. ? Remove the heat if your skin turns bright red. This is especially  important if you are unable to feel pain, heat, or cold. You may have a greater risk of getting burned.  Keep all follow-up visits as told by your health care provider. This is important. Contact a health care provider if you have:  Increasing pain or swelling in the injured area.  Numbness, tingling, or a significant loss of strength in the injured area. Get help right away if:  Your foot or your toes become cold or turn blue. Summary  A hamstring strain happens when the muscles in the back of the thighs (hamstring muscles) are overstretched or torn.  This injury can be caused by a sudden, violent force being placed on the hamstring muscles, causing them to stretch too far.  Symptoms include pain, swelling, and muscle spasms in the injured area.  Treatment includes what is called PRICE therapy: protecting, resting, icing, applying compression, and elevating the injured area. This information is not intended to replace advice given to you by your health care provider. Make sure you discuss any questions you have with your health care provider. Document Revised: 04/02/2020 Document Reviewed: 04/02/2020 Elsevier Patient Education  2021 [employer] Inc.  

## 2020-11-03 NOTE — Progress Notes (Signed)
Subjective:    CC: L leg and buttock pain  I, Marcus Taylor, LAT, ATC, am serving as scribe for Dr. Lynne Leader.  HPI: Pt is a 80 y/o male presenting w/ L buttock and leg pain x 3 weeks when he was attempting to turn to his L but his foot got stuck, causing a pain in his L buttock and hamstring.  He reports feeling a "knot" at his L distal glute/proximal h/s.  He also notes initial bruising along his L post thigh/hamstring that has since resolved.  He was seen by his PCP on 11/02/20 for these c/o. Pt notes ecchymosis was present along the posterior thigh. Pt reports buttock hurts worse when sitting and leg hurts when standing/moving around.  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  Radiating pain: whole L leg hurts Aggravating factors: Sitting, laying  Treatments tried: Tylenol  Diagnostic imaging: Pelvis XR- 11/02/20  Pertinent review of Systems: No fevers or chills  Relevant historical information: History diabetes cirrhosis splenomegaly. B-cell lymphoma splenic margin   Objective:    Vitals:   11/04/20 0822  BP: (!) 150/81  Pulse: 67  SpO2: 95%   General: Well Developed, well nourished, and in no acute distress.   MSK: Left buttocks normal-appearing Tender palpation ischial tuberosity. Normal hip motion pain with hip extension. No pain with resisted knee flexion. Strength is intact. Pulses capillary fill and sensation are intact distally.  Lab and Radiology Results No results found for this or any previous visit (from the past 72 hour(s)). DG Pelvis 1-2 Views  Result Date: 11/03/2020 CLINICAL DATA:  Left buttock pain.  Hamstring injury suspected. EXAM: PELVIS - 1-2 VIEW COMPARISON:  CT abdomen pelvis dated July 22, 2018. FINDINGS: There is no evidence of pelvic fracture or diastasis. No pelvic bone lesions are seen. Mild bilateral hip and sacroiliac joint osteoarthritis. Advanced lower  lumbar degenerative disc disease. IMPRESSION: 1. No acute osseous abnormality. 2. Mild bilateral hip and sacroiliac joint osteoarthritis. Electronically Signed   By: Titus Dubin M.D.   On: 11/03/2020 13:16  I, Lynne Leader, personally (independently) visualized and performed the interpretation of the images attached in this note. No avulsion fractures are visible.  Diagnostic Limited MSK Ultrasound of: Left ischial tuberosity Hamstring origin visible with small defect hamstring tendon consistent with partial tear.  Increased vascular activity on Doppler present.  No significant tendon retraction visible. Impression: String tear proximal left     Impression and Recommendations:    Assessment and Plan: 80 y.o. male with left hamstring tear/strain.  Plan for physical therapy.  Discussed medication options.  Recommend holding on prednisone for now.  Using Tylenol should be safe.  Recommend avoiding ibuprofen if possible.  Physical therapy should be the best option.  Recommend also donut cushion if possible for prolonged sitting.  Recheck back in 1 month return sooner if needed.Marland Kitchen  PDMP not reviewed this encounter. Orders Placed This Encounter  Procedures   [place] LIMITED JOINT SPACE STRUCTURES LOW LEFT(NO LINKED CHARGES)    Standing Status:   Future    Number of Occurrences:   1    Standing Expiration Date:   05/07/2021    Order Specific Question:   Reason for Exam (SYMPTOM  OR DIAGNOSIS REQUIRED)    Answer:   left thigh pain    Order Specific Question:   Preferred imaging location?    Answer:   [facility]  [place]   Ambulatory referral to Physical Therapy    Referral Priority:   Routine    Referral Type:   Physical Medicine    Referral Reason:   Specialty Services Required    Requested Specialty:   Physical Therapy    Number of Visits Requested:   1   No orders of the defined types were placed in this encounter.   Discussed warning signs or symptoms. Please see  discharge instructions. Patient expresses understanding.   The above documentation has been reviewed and is accurate and complete Lynne Leader, M.D.

## 2020-11-04 ENCOUNTER — Other Ambulatory Visit: Payer: Self-pay

## 2020-11-04 ENCOUNTER — Ambulatory Visit: Payer: Self-pay

## 2020-11-04 ENCOUNTER — Ambulatory Visit: Payer: Medicare Other | Admitting: Family Medicine

## 2020-11-04 VITALS — BP 150/81 | HR 67 | Ht 72.0 in | Wt 206.4 lb

## 2020-11-04 DIAGNOSIS — S76302A Unspecified injury of muscle, fascia and tendon of the posterior muscle group at thigh level, left thigh, initial encounter: Secondary | ICD-10-CM

## 2020-11-04 NOTE — Patient Instructions (Addendum)
Thank you for coming in today.  Plan for PT.   Recheck with me in 1 month.   Try to use a cushion with a hole in it.    Hamstring Strain  A hamstring strain happens when the muscles in the back of the thighs (hamstring muscles) are overstretched or torn. The hamstring muscles are used in straightening the hips, bending the knees, and pulling back the legs. This injury is often called a pulled hamstring muscle. The tissue that connects the muscle to a bone (tendon) may also be affected. The severity of a hamstring strain may be rated in degrees or grades. First-degree (or grade 1) strains have the least amount of muscle tearing and pain. Second-degree and third-degree (grade 2 and 3) strains have increasingly more tearing and pain. What are the causes? This condition is caused by a sudden, violent force being placed on the hamstring muscles, stretching them too far. This often happens during activities that involve running, jumping, kicking, or weight lifting. What increases the risk? Hamstring strains are especially common in athletes. The following factors may also make you more likely to develop this condition:  Having low strength, endurance, or flexibility of the hamstring muscles.  Doing high-impact physical activity or sports.  Having poor physical fitness.  Having a previous leg injury.  Having tired (fatigued) muscles. What are the signs or symptoms? Symptoms of this condition include:  Pain in the back of the thigh.  Swelling.  Bruising.  Muscle spasms.  Trouble moving the affected muscle because of pain. For severe strains, you may feel popping or snapping in the back of your thigh when the injury occurs. How is this diagnosed? This condition is diagnosed based on your symptoms, your medical history, and a physical exam. How is this treated? Treatment for this condition usually involves:  Protecting, resting, icing, applying compression, and elevating the  injured area (PRICE therapy).  Medicines. Your health care provider may recommend medicines to help reduce pain or inflammation.  Doing exercises to regain strength and flexibility in the muscles. Your health care provider will tell you when it is okay to begin exercising. Follow these instructions at home: PRICE therapy Use PRICE therapy to promote muscle healing during the first 2-3 days after your injury, or as told by your health care provider.  Protect the muscle from being injured again.  Rest your injury. This usually involves limiting your normal activities and not using the injured hamstring muscle. Talk with your health care provider about how you should limit your activities.  Apply ice to the injured area: ? Put ice in a plastic bag. ? Place a towel between your skin and the bag. ? Leave the ice on for 20 minutes, 2-3 times a day. After the third day, switch to applying heat as told.  Put pressure (compression) on your injured hamstring by wrapping it with an elastic bandage. Be careful not to wrap it too tightly. That may interfere with blood circulation or may increase swelling.  Raise (elevate) your injured hamstring above the level of your heart as often as possible. When you are lying down, you can do this by putting a pillow under your thigh.   Activity  Begin exercising or stretching only as told by your health care provider.  Do not return to full activity level until your health care provider approves.  To help prevent muscle strains in the future, always warm up before exercising and stretch afterward. General instructions  Take over-the-counter and  prescription medicines only as told by your health care provider.  If directed, apply heat to the affected area as often as told by your health care provider. Use the heat source that your health care provider recommends, such as a moist heat pack or a heating pad. ? Place a towel between your skin and the heat  source. ? Leave the heat on for 20-30 minutes. ? Remove the heat if your skin turns bright red. This is especially important if you are unable to feel pain, heat, or cold. You may have a greater risk of getting burned.  Keep all follow-up visits as told by your health care provider. This is important. Contact a health care provider if you have:  Increasing pain or swelling in the injured area.  Numbness, tingling, or a significant loss of strength in the injured area. Get help right away if:  Your foot or your toes become cold or turn blue. Summary  A hamstring strain happens when the muscles in the back of the thighs (hamstring muscles) are overstretched or torn.  This injury can be caused by a sudden, violent force being placed on the hamstring muscles, causing them to stretch too far.  Symptoms include pain, swelling, and muscle spasms in the injured area.  Treatment includes what is called PRICE therapy: protecting, resting, icing, applying compression, and elevating the injured area. This information is not intended to replace advice given to you by your health care provider. Make sure you discuss any questions you have with your health care provider. Document Revised: 04/02/2020 Document Reviewed: 04/02/2020 Elsevier Patient Education  [place].

## 2020-11-05 ENCOUNTER — Other Ambulatory Visit: Payer: Self-pay | Admitting: Hematology and Oncology

## 2020-11-05 DIAGNOSIS — D500 Iron deficiency anemia secondary to blood loss (chronic): Secondary | ICD-10-CM

## 2020-11-05 DIAGNOSIS — C8307 Small cell B-cell lymphoma, spleen: Secondary | ICD-10-CM

## 2020-11-08 ENCOUNTER — Other Ambulatory Visit: Payer: Self-pay

## 2020-11-08 ENCOUNTER — Inpatient Hospital Stay: Payer: Medicare Other | Attending: Hematology and Oncology | Admitting: Hematology and Oncology

## 2020-11-08 ENCOUNTER — Inpatient Hospital Stay: Payer: Medicare Other

## 2020-11-08 ENCOUNTER — Encounter: Payer: Self-pay | Admitting: Hematology and Oncology

## 2020-11-08 VITALS — BP 143/60 | HR 58 | Temp 97.2°F | Resp 18 | Ht 72.0 in | Wt 208.6 lb

## 2020-11-08 DIAGNOSIS — C8307 Small cell B-cell lymphoma, spleen: Secondary | ICD-10-CM

## 2020-11-08 DIAGNOSIS — D539 Nutritional anemia, unspecified: Secondary | ICD-10-CM | POA: Diagnosis not present

## 2020-11-08 DIAGNOSIS — D500 Iron deficiency anemia secondary to blood loss (chronic): Secondary | ICD-10-CM

## 2020-11-08 DIAGNOSIS — D630 Anemia in neoplastic disease: Secondary | ICD-10-CM | POA: Insufficient documentation

## 2020-11-08 DIAGNOSIS — E118 Type 2 diabetes mellitus with unspecified complications: Secondary | ICD-10-CM

## 2020-11-08 DIAGNOSIS — C884 Extranodal marginal zone B-cell lymphoma of mucosa-associated lymphoid tissue [MALT-lymphoma]: Secondary | ICD-10-CM | POA: Insufficient documentation

## 2020-11-08 LAB — CBC WITH DIFFERENTIAL/PLATELET
Abs Immature Granulocytes: 0.11 10*3/uL — ABNORMAL HIGH (ref 0.00–0.07)
Basophils Absolute: 0.1 10*3/uL (ref 0.0–0.1)
Basophils Relative: 0 %
Eosinophils Absolute: 0 10*3/uL (ref 0.0–0.5)
Eosinophils Relative: 0 %
HCT: 35 % — ABNORMAL LOW (ref 39.0–52.0)
Hemoglobin: 11.3 g/dL — ABNORMAL LOW (ref 13.0–17.0)
Immature Granulocytes: 0 %
Lymphocytes Relative: 87 %
Lymphs Abs: 26.8 10*3/uL — ABNORMAL HIGH (ref 0.7–4.0)
MCH: 28.3 pg (ref 26.0–34.0)
MCHC: 32.3 g/dL (ref 30.0–36.0)
MCV: 87.5 fL (ref 80.0–100.0)
Monocytes Absolute: 1 10*3/uL (ref 0.1–1.0)
Monocytes Relative: 3 %
Neutro Abs: 3 10*3/uL (ref 1.7–7.7)
Neutrophils Relative %: 10 %
Platelets: 185 10*3/uL (ref 150–400)
RBC: 4 MIL/uL — ABNORMAL LOW (ref 4.22–5.81)
RDW: 16 % — ABNORMAL HIGH (ref 11.5–15.5)
WBC: 31 10*3/uL — ABNORMAL HIGH (ref 4.0–10.5)
nRBC: 0 % (ref 0.0–0.2)

## 2020-11-08 LAB — IRON AND TIBC
Iron: 55 ug/dL (ref 42–163)
Saturation Ratios: 21 % (ref 20–55)
TIBC: 254 ug/dL (ref 202–409)
UIBC: 199 ug/dL (ref 117–376)

## 2020-11-08 LAB — COMPREHENSIVE METABOLIC PANEL
ALT: 25 U/L (ref 0–44)
AST: 27 U/L (ref 15–41)
Albumin: 3.9 g/dL (ref 3.5–5.0)
Alkaline Phosphatase: 75 U/L (ref 38–126)
Anion gap: 9 (ref 5–15)
BUN: 20 mg/dL (ref 8–23)
CO2: 29 mmol/L (ref 22–32)
Calcium: 8.6 mg/dL — ABNORMAL LOW (ref 8.9–10.3)
Chloride: 105 mmol/L (ref 98–111)
Creatinine, Ser: 0.79 mg/dL (ref 0.61–1.24)
GFR, Estimated: >60 mL/min (ref >60)
Glucose, Bld: 114 mg/dL — ABNORMAL HIGH (ref 70–99)
Potassium: 3.7 mmol/L (ref 3.5–5.1)
Sodium: 143 mmol/L (ref 135–145)
Total Bilirubin: 0.4 mg/dL (ref 0.3–1.2)
Total Protein: 6.1 g/dL — ABNORMAL LOW (ref 6.5–8.1)

## 2020-11-08 LAB — FERRITIN: Ferritin: 68 ng/mL (ref 24–336)

## 2020-11-08 NOTE — Progress Notes (Signed)
[facility] OFFICE PROGRESS NOTE  Patient Care Team: [name], DO as PCP - General (Family Medicine) [name], MD (Inactive) as Consulting Physician (Gastroenterology) [name], MD as Consulting Physician (Hematology and Oncology) [name], MD as Referring Physician (Cardiology) [name], MD as Consulting Physician (Neurosurgery) [name], OD as Referring Physician Garald Balding, MD as Consulting Physician (Orthopedic Surgery) Trula Slade, DPM as Consulting Physician (Podiatry)  ASSESSMENT & PLAN:  Splenic marginal zone b-cell lymphoma [facility]) He has slight worsening lymphocytosis and anemia He has no palpable splenomegaly or lymphadenopathy He is not symptomatic Plan to see him in 4 months instead of 6 months   Anemia in neoplastic disease He has multifactorial anemia, likely secondary to intermittent iron deficiency and anemia of chronic disease related to his splenic lymphoma Observe only for now  Diabetes mellitus type 2, controlled, with complications ([place]) His diabetes is well controlled but he has gained some weight On review of his diet, it appears that the patient eats a lot of fruit We discussed the importance of getting his diabetes and weight under control in anticipation for future treatment   Orders Placed This Encounter  Procedures  . Iron and TIBC    Standing Status:   Future    Standing Expiration Date:   11/08/2021  . Ferritin    Standing Status:   Future    Standing Expiration Date:   11/08/2021  . Sedimentation rate    Standing Status:   Future    Standing Expiration Date:   11/08/2021  . Uric acid    Standing Status:   Future    Standing Expiration Date:   11/08/2021  . Lactate dehydrogenase    Standing Status:   Future    Standing Expiration Date:   11/08/2021  . CBC with Differential/Platelet    Standing Status:   Future    Standing Expiration Date:   11/08/2021  . Comprehensive metabolic  panel    Standing Status:   Future    Standing Expiration Date:   11/08/2021    All questions were answered. The patient knows to call the clinic with any problems, questions or concerns. The total time spent in the appointment was 20 minutes encounter with patients including review of chart and various tests results, discussions about plan of care and coordination of care plan   [name], MD 11/08/2020 10:24 AM  INTERVAL HISTORY: Please see below for problem oriented charting. He returns for treatment and follow-up No recent lymphadenopathy, anorexia, weight loss or night sweats In fact he has gained some weight The patient denies any recent signs or symptoms of bleeding such as spontaneous epistaxis, hematuria or hematochezia.  SUMMARY OF ONCOLOGIC HISTORY: Oncology History  Splenic marginal zone b-cell lymphoma ([place])  12/18/2011 Initial Diagnosis   Splenic marginal zone b-cell lymphoma ([place])   11/08/2020 Cancer Staging   Staging form: Hodgkin and Non-Hodgkin Lymphoma, AJCC 7th Edition - Clinical: S - Spleen - Signed by [name], MD on 11/08/2020 Staged by: Managing physician Stage prefix: Initial diagnosis     REVIEW OF SYSTEMS:   Constitutional: Denies fevers, chills or abnormal weight loss Eyes: Denies blurriness of vision Ears, nose, mouth, throat, and face: Denies mucositis or sore throat Respiratory: Denies cough, dyspnea or wheezes Cardiovascular: Denies palpitation, chest discomfort or lower extremity swelling Gastrointestinal:  Denies nausea, heartburn or change in bowel habits Skin: Denies abnormal skin rashes Lymphatics: Denies new lymphadenopathy or easy bruising Neurological:Denies numbness,  tingling or new weaknesses Behavioral/Psych: Mood is stable, no new changes  All other systems were reviewed with the patient and are negative.  I have reviewed the past medical history, past surgical history, social history and family history with the patient and they  are unchanged from previous note.  ALLERGIES:  has No Known Allergies.  MEDICATIONS:  Current Outpatient Medications  Medication Sig Dispense Refill  . amLODipine (NORVASC) 5 MG tablet Take 1.5 tablets (7.5 mg total) by mouth daily. 135 tablet 1  . aspirin EC 81 MG tablet Take 81 mg by mouth daily.    Marland Kitchen atorvastatin (LIPITOR) 40 MG tablet Take 1 tablet (40 mg total) by mouth daily. 90 tablet 3  . diclofenac Sodium (VOLTAREN) 1 % GEL APPLY 2 GRAMS TO AFFECTED AREA 4 TIMES A DAY 600 g 1  . fluticasone (FLONASE) 50 MCG/ACT nasal spray SPRAY 2 SPRAYS INTO EACH NOSTRIL EVERY DAY 16 mL 1  . lisinopril (ZESTRIL) 40 MG tablet Take 1 tablet (40 mg total) by mouth daily. 90 tablet 1  . metFORMIN (GLUCOPHAGE-XR) 500 MG 24 hr tablet TAKE 1 TABLET BY MOUTH EVERY MONDAY, WEDNESDAY,AND FRIDAY WITH EVENING MEAL 40 tablet 2  . metoprolol tartrate (LOPRESSOR) 25 MG tablet Take 0.5 tablets (12.5 mg total) by mouth 2 (two) times daily. 90 tablet 1  . nitroGLYCERIN (NITROSTAT) 0.4 MG SL tablet Place 1 tablet (0.4 mg total) under the tongue every 5 (five) minutes as needed for chest pain. 10 tablet 0  . predniSONE (DELTASONE) 20 MG tablet 60 mg x3d, 40 mg x3d, 20 mg x2d, 10 mg x2d 18 tablet 0   Current Facility-Administered Medications  Medication Dose Route Frequency Provider Last Rate Last Admin  . 0.9 %  sodium chloride infusion  500 mL Intravenous Once Armbruster, Carlota Raspberry, MD        PHYSICAL EXAMINATION: ECOG PERFORMANCE STATUS: 1 - Symptomatic but completely ambulatory  Vitals:   11/08/20 0940  BP: (!) 143/60  Pulse: (!) 58  Resp: 18  Temp: (!) 97.2 F (36.2 C)  SpO2: 98%   Filed Weights   11/08/20 0940  Weight: 208 lb 9.6 oz (94.6 kg)    GENERAL:alert, no distress and comfortable SKIN: skin color, texture, turgor are normal, no rashes or significant lesions EYES: normal, Conjunctiva are pink and non-injected, sclera clear OROPHARYNX:no exudate, no erythema and lips, buccal mucosa, and  tongue normal  NECK: supple, thyroid normal size, non-tender, without nodularity LYMPH:  no palpable lymphadenopathy in the cervical, axillary or inguinal LUNGS: clear to auscultation and percussion with normal breathing effort HEART: regular rate & rhythm and no murmurs and no lower extremity edema ABDOMEN:abdomen soft, non-tender and normal bowel sounds.  No palpable splenomegaly Musculoskeletal:no cyanosis of digits and no clubbing  NEURO: alert & oriented x 3 with fluent speech, no focal motor/sensory deficits  LABORATORY DATA:  I have reviewed the data as listed    Component Value Date/Time   NA 143 11/08/2020 0929   NA 144 05/31/2015 1404   K 3.7 11/08/2020 0929   K 4.7 05/31/2015 1404   CL 105 11/08/2020 0929   CL 102 10/07/2012 1013   CO2 29 11/08/2020 0929   CO2 29 05/31/2015 1404   GLUCOSE 114 (H) 11/08/2020 0929   GLUCOSE 99 05/31/2015 1404   GLUCOSE 199 (H) 10/07/2012 1013   BUN 20 11/08/2020 0929   BUN 18.3 05/31/2015 1404   CREATININE 0.79 11/08/2020 0929   CREATININE 0.86 03/13/2017 0759   CREATININE 0.9  05/31/2015 1404   CALCIUM 8.6 (L) 11/08/2020 0929   CALCIUM 9.2 05/31/2015 1404   PROT 6.1 (L) 11/08/2020 0929   PROT 6.3 (L) 05/31/2015 1404   ALBUMIN 3.9 11/08/2020 0929   ALBUMIN 3.9 05/31/2015 1404   AST 27 11/08/2020 0929   AST 28 05/31/2015 1404   ALT 25 11/08/2020 0929   ALT 23 05/31/2015 1404   ALKPHOS 75 11/08/2020 0929   ALKPHOS 79 05/31/2015 1404   BILITOT 0.4 11/08/2020 0929   BILITOT 0.46 05/31/2015 1404   GFRNONAA >60 11/08/2020 0929   GFRNONAA 85 03/13/2017 0759   GFRAA >60 05/03/2020 0857   GFRAA >89 03/13/2017 0759    No results found for: SPEP, UPEP  Lab Results  Component Value Date   WBC 31.0 (H) 11/08/2020   NEUTROABS 3.0 11/08/2020   HGB 11.3 (L) 11/08/2020   HCT 35.0 (L) 11/08/2020   MCV 87.5 11/08/2020   PLT 185 11/08/2020      Chemistry      Component Value Date/Time   NA 143 11/08/2020 0929   NA 144 05/31/2015  1404   K 3.7 11/08/2020 0929   K 4.7 05/31/2015 1404   CL 105 11/08/2020 0929   CL 102 10/07/2012 1013   CO2 29 11/08/2020 0929   CO2 29 05/31/2015 1404   BUN 20 11/08/2020 0929   BUN 18.3 05/31/2015 1404   CREATININE 0.79 11/08/2020 0929   CREATININE 0.86 03/13/2017 0759   CREATININE 0.9 05/31/2015 1404      Component Value Date/Time   CALCIUM 8.6 (L) 11/08/2020 0929   CALCIUM 9.2 05/31/2015 1404   ALKPHOS 75 11/08/2020 0929   ALKPHOS 79 05/31/2015 1404   AST 27 11/08/2020 0929   AST 28 05/31/2015 1404   ALT 25 11/08/2020 0929   ALT 23 05/31/2015 1404   BILITOT 0.4 11/08/2020 0929   BILITOT 0.46 05/31/2015 1404

## 2020-11-08 NOTE — Assessment & Plan Note (Signed)
He has multifactorial anemia, likely secondary to intermittent iron deficiency and anemia of chronic disease related to his splenic lymphoma Observe only for now

## 2020-11-08 NOTE — Assessment & Plan Note (Signed)
His diabetes is well controlled but he has gained some weight On review of his diet, it appears that the patient eats a lot of fruit We discussed the importance of getting his diabetes and weight under control in anticipation for future treatment

## 2020-11-08 NOTE — Assessment & Plan Note (Signed)
He has slight worsening lymphocytosis and anemia He has no palpable splenomegaly or lymphadenopathy He is not symptomatic Plan to see him in 4 months instead of 6 months

## 2020-11-10 DIAGNOSIS — S76302A Unspecified injury of muscle, fascia and tendon of the posterior muscle group at thigh level, left thigh, initial encounter: Secondary | ICD-10-CM | POA: Diagnosis not present

## 2020-11-16 DIAGNOSIS — S76302A Unspecified injury of muscle, fascia and tendon of the posterior muscle group at thigh level, left thigh, initial encounter: Secondary | ICD-10-CM | POA: Diagnosis not present

## 2020-11-18 DIAGNOSIS — S76302A Unspecified injury of muscle, fascia and tendon of the posterior muscle group at thigh level, left thigh, initial encounter: Secondary | ICD-10-CM | POA: Diagnosis not present

## 2020-11-23 DIAGNOSIS — S76302A Unspecified injury of muscle, fascia and tendon of the posterior muscle group at thigh level, left thigh, initial encounter: Secondary | ICD-10-CM | POA: Diagnosis not present

## 2020-11-25 DIAGNOSIS — S76302A Unspecified injury of muscle, fascia and tendon of the posterior muscle group at thigh level, left thigh, initial encounter: Secondary | ICD-10-CM | POA: Diagnosis not present

## 2020-11-30 DIAGNOSIS — S76302A Unspecified injury of muscle, fascia and tendon of the posterior muscle group at thigh level, left thigh, initial encounter: Secondary | ICD-10-CM | POA: Diagnosis not present

## 2020-12-02 DIAGNOSIS — S76302A Unspecified injury of muscle, fascia and tendon of the posterior muscle group at thigh level, left thigh, initial encounter: Secondary | ICD-10-CM | POA: Diagnosis not present

## 2020-12-06 DIAGNOSIS — S76302A Unspecified injury of muscle, fascia and tendon of the posterior muscle group at thigh level, left thigh, initial encounter: Secondary | ICD-10-CM | POA: Diagnosis not present

## 2020-12-06 NOTE — Progress Notes (Signed)
I, Wendy Poet, LAT, ATC, am serving as scribe for Dr. Lynne Leader.  Marcus Taylor is a 80 y.o. male who presents to [facility] today for f/u of L hamstring strain that occurred in early March 2022.  He was last seen by Dr. Georgina Snell on 11/04/20 and was referred to [facility].  He was also advised to use a doughnut pad when sitting to assist in off-setting pressure to the area.  Since his last visit, pt reports that he's feeling pretty good.  He states that he feels decent w/ walking and moving around but con't to have the same amount of pain w/ sitting.  He has been using a doughnut pad which has not helped much.   Pertinent review of systems: No fevers or chills  Relevant historical information: Hypertension.  Lymphoma   Exam:  BP (!) 108/56 (BP Location: Right Arm, Patient Position: Sitting, Cuff Size: Normal)   Pulse (!) 52   Ht 6' (1.829 m)   Wt 195 lb 6.4 oz (88.6 kg)   SpO2 96%   BMI 26.50 kg/m  General: Well Developed, well nourished, and in no acute distress.   MSK: Left hip normal-appearing Tender palpation ischial tuberosity. Normal hip strength.    Lab and Radiology Results  Procedure: Real-time Ultrasound Guided Injection of left hip ischial tuberosity bursa Device: Philips Affiniti 50G Images permanently stored and available for review in PACS Ultrasound evaluation reveals a small bursa or seroma overlying left ischial tuberosity mildly tender palpation Verbal informed consent obtained.  Discussed risks and benefits of procedure. Warned about infection bleeding damage to structures skin hypopigmentation and fat atrophy among others. Patient expresses understanding and agreement Time-out conducted.   Noted no overlying erythema, induration, or other signs of local infection.   Skin prepped in a sterile fashion.   Local anesthesia: Topical Ethyl chloride.   With sterile technique and under real time ultrasound guidance:  40 mg of  Kenalog without lidocaine injected into bursa. Fluid seen entering the bursa.   Completed without difficulty     Advised to call if fevers/chills, erythema, induration, drainage, or persistent bleeding.   Images permanently stored and available for review in the ultrasound unit.  Impression: Technically successful ultrasound guided injection.         Assessment and Plan: 80 y.o. male with left hip ischial tuberosity bursitis versus seroma from hamstring injury.  Patient has had significant improvement with physical therapy but still has persistent tenderness at ischial tuberosity with prolonged sitting.  Ultrasound evaluation reveals a persistent fluid collection overlying the ischial tuberosity consistent with bursitis versus seroma.  Plan for injection of this area with steroids.  Based on its proximity to the sciatic nerve we will not use lidocaine or Marcaine.  Patient should have improvement with this.  Continue home exercise program and recheck back in 6 weeks or so.  If not improving or if worsening in the meantime next step would probably be MRI.   PDMP not reviewed this encounter. Orders Placed This Encounter  Procedures  . [place] LIMITED JOINT SPACE STRUCTURES LOW LEFT(NO LINKED CHARGES)    Standing Status:   Future    Number of Occurrences:   1    Standing Expiration Date:   06/08/2021    Order Specific Question:   Reason for Exam (SYMPTOM  OR DIAGNOSIS REQUIRED)    Answer:   left hip pain    Order Specific Question:   Preferred imaging location?  Answer:   [place]   No orders of the defined types were placed in this encounter.    Discussed warning signs or symptoms. Please see discharge instructions. Patient expresses understanding.   The above documentation has been reviewed and is accurate and complete Lynne Leader, M.D.

## 2020-12-07 ENCOUNTER — Other Ambulatory Visit: Payer: Self-pay

## 2020-12-07 ENCOUNTER — Ambulatory Visit: Payer: Self-pay

## 2020-12-07 ENCOUNTER — Ambulatory Visit: Payer: Medicare Other | Admitting: Family Medicine

## 2020-12-07 ENCOUNTER — Encounter: Payer: Self-pay | Admitting: Family Medicine

## 2020-12-07 VITALS — BP 108/56 | HR 52 | Ht 72.0 in | Wt 195.4 lb

## 2020-12-07 DIAGNOSIS — S76302A Unspecified injury of muscle, fascia and tendon of the posterior muscle group at thigh level, left thigh, initial encounter: Secondary | ICD-10-CM

## 2020-12-07 DIAGNOSIS — M7072 Other bursitis of hip, left hip: Secondary | ICD-10-CM | POA: Diagnosis not present

## 2020-12-07 NOTE — Patient Instructions (Addendum)
Good to see you today.  You had a L ischial tuberosity injection today.  Call or go to the ER if you develop a large red swollen joint with extreme pain or oozing puss.   Recheck in 6 weeks.   Let me know if you are not doing well before then.   Continue the exercises.

## 2020-12-13 ENCOUNTER — Other Ambulatory Visit: Payer: Self-pay | Admitting: Family Medicine

## 2020-12-13 DIAGNOSIS — I10 Essential (primary) hypertension: Secondary | ICD-10-CM

## 2020-12-21 ENCOUNTER — Other Ambulatory Visit: Payer: Self-pay

## 2020-12-21 ENCOUNTER — Encounter: Payer: Self-pay | Admitting: Family Medicine

## 2020-12-21 ENCOUNTER — Ambulatory Visit (INDEPENDENT_AMBULATORY_CARE_PROVIDER_SITE_OTHER): Payer: Medicare Other | Admitting: Family Medicine

## 2020-12-21 VITALS — BP 103/61 | HR 70 | Temp 97.4°F | Ht 72.0 in | Wt 189.0 lb

## 2020-12-21 DIAGNOSIS — D630 Anemia in neoplastic disease: Secondary | ICD-10-CM

## 2020-12-21 DIAGNOSIS — E785 Hyperlipidemia, unspecified: Secondary | ICD-10-CM | POA: Diagnosis not present

## 2020-12-21 DIAGNOSIS — D500 Iron deficiency anemia secondary to blood loss (chronic): Secondary | ICD-10-CM

## 2020-12-21 DIAGNOSIS — G5601 Carpal tunnel syndrome, right upper limb: Secondary | ICD-10-CM | POA: Diagnosis not present

## 2020-12-21 DIAGNOSIS — E118 Type 2 diabetes mellitus with unspecified complications: Secondary | ICD-10-CM

## 2020-12-21 DIAGNOSIS — I252 Old myocardial infarction: Secondary | ICD-10-CM

## 2020-12-21 DIAGNOSIS — D61818 Other pancytopenia: Secondary | ICD-10-CM

## 2020-12-21 DIAGNOSIS — C8307 Small cell B-cell lymphoma, spleen: Secondary | ICD-10-CM

## 2020-12-21 DIAGNOSIS — I2510 Atherosclerotic heart disease of native coronary artery without angina pectoris: Secondary | ICD-10-CM

## 2020-12-21 DIAGNOSIS — D696 Thrombocytopenia, unspecified: Secondary | ICD-10-CM | POA: Diagnosis not present

## 2020-12-21 DIAGNOSIS — I10 Essential (primary) hypertension: Secondary | ICD-10-CM

## 2020-12-21 LAB — POCT GLYCOSYLATED HEMOGLOBIN (HGB A1C)
HbA1c POC (<> result, manual entry): 5.3 %
HbA1c, POC (controlled diabetic range): 5.3 % (ref 0.0–7.0)
HbA1c, POC (prediabetic range): 5.3 % — AB (ref 5.7–6.4)
Hemoglobin A1C: 5.3 % (ref 4.0–5.6)

## 2020-12-21 MED ORDER — METFORMIN HCL ER 500 MG PO TB24: ORAL_TABLET | ORAL | 3 refills | Status: DC

## 2020-12-21 MED ORDER — METOPROLOL TARTRATE 25 MG PO TABS: 12.5000 mg | ORAL_TABLET | Freq: Two times a day (BID) | ORAL | 1 refills | Status: DC

## 2020-12-21 MED ORDER — LISINOPRIL 40 MG PO TABS: 40.0000 mg | ORAL_TABLET | Freq: Every day | ORAL | 1 refills | Status: DC

## 2020-12-21 MED ORDER — AMLODIPINE BESYLATE 5 MG PO TABS: 5.0000 mg | ORAL_TABLET | Freq: Every day | ORAL | 1 refills | Status: DC

## 2020-12-21 NOTE — Progress Notes (Signed)
Patient ID: Marcus Taylor, male   DOB: Apr 17, 1941, 80 y.o.   MRN: [id number]      Patient ID: Marcus Taylor, male  DOB: 1940/10/18, 80 y.o.   MRN: [id number] Patient Care Team    Relationship Specialty Notifications Start End  [name], DO PCP - General Family Medicine  01/26/16   [name], MD (Inactive) Consulting Physician Gastroenterology  01/26/16   [name], MD Consulting Physician Hematology and Oncology  01/26/16   [name], MD Referring Physician Cardiology  01/26/16   [name], MD Consulting Physician Neurosurgery  01/27/16   [name], OD Referring Physician   05/03/16    Comment: opthalmology   Garald Balding, MD Consulting Physician Orthopedic Surgery  06/14/18   Trula Slade, DPM Consulting Physician Podiatry  06/14/18    Chief Complaint  Patient presents with  . Follow-up    [facility] pt is not fasting  . Hypertension    Subjective: Marcus Taylor is a 80 y.o. male present for follow up on  Hypertension/H/o MI/CAD/stentsx3/iron deficiency anemia: Pt routinely follows with cardiology at [place], Dr. [name]. Pt has had stent placement and MI in 2015. He is complaint  Lipitor, lisinopril 40 mg, lopressor 12.5 (BID), amlodipine 7.5 mg qd. He takes a daily baby asa. He is prescribed nitro, he has not needed it.Plavix  stopped (> 5yr since stent)Patient denies chest pain, shortness of breath, dizziness or lower extremity edema. Pressures have been low normal since March when he started losing weight.  Diet: low sodium  Diabetes type 2: Patient reportscompliance with  metformin 500 mg Monday Wednesday and Friday. Patient denies dizziness, hyperglycemic or hypoglycemic events. Patient denies numbness, tingling in the extremities or nonhealing wounds of feet.  A1c: 6.2 --> 6.0>5.7>> 5.9>5.8>6.0>5.9> 5.3 - Opthalmology: 10/2020 [place] Dr. [name].  - pna series UTD/completed  - flu shot up-to-date 2021 - foot exam completed 06/04/2020 -  urine micro on ACe Losing weight by making healthy food choices.   iron deficiency anemia:  Iron UTD 01/2021-   Past Medical History:  Diagnosis Date  . Anemia   . Arthritis   . Cervical radiculopathy 12/22/2019  . Colonic polyp   . Concussion 03/05/2018  . Coronary artery disease    stents x 3 (03-2014)  . Diabetes mellitus   . Diverticulosis    throughout entire colon  . History of total right knee replacement 07/09/2018  . HTN (hypertension)   . Hypertriglyceridemia   . Ingrown toenail 10/21/2019  . Kidney stone   . Lymphocytosis   . Metatarsal fracture left   left 5th proximal phalanx   . Neck pain 02/19/2018  . OSA (obstructive sleep apnea)    "Mild" by records  . PONV (postoperative nausea and vomiting)    first time, no problem since then  . RBBB 01/2016  . Right inguinal hernia 02/03/2016   ? Noted 02/2015 sent to surgery   . Splenic marginal zone b-cell lymphoma ([place]) 12/18/2011  . Splenomegaly   . STEMI (ST elevation myocardial infarction) [facility])    August 2015  . Subarachnoid hematoma ([place]) 03/12/2018  . Subarachnoid hemorrhage ([place]) 03/05/2018   No Known Allergies Past Surgical History:  Procedure Laterality Date  . CARDIAC CATHETERIZATION     stents x 3 (03/2014)  . CHOLECYSTECTOMY    . INGUINAL HERNIA REPAIR Right 05/27/2015   Procedure: LAPAROSCOPIC REPAIR RIGHT  INGUINAL HERNIA;  Surgeon: Greer Pickerel, MD;  Location[facility];  Service: General;  Laterality: Right;  . INSERTION OF MESH Right 05/27/2015   Procedure: INSERTION OF MESH;  Surgeon: Greer Pickerel, MD;  Location: [facility];  Service: General;  Laterality: Right;  . LUMBAR DISC SURGERY    . TONSILLECTOMY AND ADENOIDECTOMY    . TOTAL KNEE ARTHROPLASTY Right 07/09/2018   Procedure: RIGHT TOTAL KNEE ARTHROPLASTY;  Surgeon: Garald Balding, MD;  Location: [place];  Service: Orthopedics;  Laterality: Right;   Family History  Problem Relation Age of Onset  . Kidney disease Sister   . Colon cancer Sister 28  .  Colon cancer Brother   . Bone cancer Brother   . Arthritis Mother   . Breast cancer Mother   . Heart disease Mother   . Early death Father   . Arthritis Son   . Esophageal cancer Sister   . Bladder Cancer Sister   . Rectal cancer Neg Hx   . Stomach cancer Neg Hx    Social History   Socioeconomic History  . Marital status: Married    Spouse name: Not on file  . Number of children: 2  . Years of education: Not on file  . Highest education level: Not on file  Occupational History  . 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 Smoking status: Former Smoker    Packs/day: 1.00    Years: 20.00    Pack years: 20.00    Quit date: 08/14/1988    Years since quitting: 32.3  . Smokeless tobacco: Never Used  Vaping Use  . Vaping Use: Never used  Substance and Sexual Activity  . Alcohol use: No  . Drug use: No  . Sexual activity: Yes    Birth control/protection: None  Other Topics Concern  . Not on file  Social History Narrative   Married to [place].    HS education. Retired.    Former smoker. No drugs or ETOH.   Wears seatbelt.   Wears dentures.   Smoke detector in the home. Firearms  In the home.    Feels safe in his relationships.    Social Determinants of Health   Financial Resource Strain: Low Risk   . Difficulty of Paying Living Expenses: Not hard at all  Food Insecurity: No Food Insecurity  . Worried About Charity fundraiser in the Last Year: Never true  . Ran Out of Food in the Last Year: Never true  Transportation Needs: No Transportation Needs  . Lack of Transportation (Medical): No  . Lack of Transportation (Non-Medical): No  Physical Activity: Inactive  . Days of Exercise per Week: 0 days  . Minutes of Exercise per Session: 0 min  Stress: No Stress Concern Present  . Feeling of Stress : Not at all  Social Connections: Moderately Isolated  . Frequency of Communication with Friends and Family: More than  three times a week  . Frequency of Social Gatherings with Friends and Family: More than three times a week  . Attends Religious Services: Never  . Active Member of Clubs or Organizations: No  . Attends Archivist Meetings: Never  . Marital Status: Married  Human resources officer Violence: Not At Risk  . Fear of Current or Ex-Partner: No  . Emotionally Abused: No  . Physically Abused: No  . Sexually Abused: No   ROS: Negative, with the exception of above mentioned in HPI  Objective: BP 103/61   Pulse 70   Temp Marland Kitchen)  97.4 F (36.3 C) (Oral)   Ht 6' (1.829 m)   Wt 189 lb (85.7 kg)   SpO2 98%   BMI 25.63 kg/m   Gen: Afebrile. No acute distress. Nontoxic pleasant male.  HENT: AT. [place]. MMM Eyes:Pupils Equal Round Reactive to light, Extraocular movements intact,  Conjunctiva without redness, discharge or icterus. Neck/lymp/endocrine: Supple, no thyromegaly CV: RRR no murmur, no edema, +2/4 P posterior tibialis pulses Chest: CTAB, no wheeze or crackles Abd: Soft. NTND. BS present. .  Skin: no rashes, purpura or petechiae.  Neuro: Normal gait. PERLA. EOMi. Alert. Oriented x3 Psych: Normal affect, dress and demeanor. Normal speech. Normal thought content and judgment.   Assessment/plan: Marcus Taylor is a 80 y.o. male present for chronic medical condition follow up.  Iron deficiency anemia:   iron levels UTD 10/2020  Controlled type 2 diabetes mellitus with complication, without long-term current use of insulin (HCC) - stable.  - continue metformin 500 mg Monday, Wednesday and Friday only.  A1c: 6.2 --> 6.0>5.7>> 5.9>5.8>6.0> 5.9> 5.3 today  - losing weight and a1c very well controlled. Would not advise much more weight loss below 185-190 for him.  - Opthalmology: 04/15/2018 [place] Dr. [name]. he is making na appt.  - pna series UTD/completed  - flu shot up-to-date 2021 - foot exam completed 10/26/2020 - urine micro on ACe  Essential hypertension/Coronary artery disease  involving native coronary artery of native heart without angina pectoris/History of ST elevation myocardial infarction (STEMI) -stable.  - Follows yearly with cardiology.  - Continue ASA - continue lopressor 12.5 twice daily - continue  lisinopril 40 mg daily -  DECREASE   amlodipine  7.5> 5 mg qd - continue  statin 1/2 tab alternation every other day - Low sodium diet encouraged. - F/U q 5.5 months  Splenic Margicnal Zone B-cell lymphoma/thrombocytopenia/anemia of chronic disease/pancytopenia/lymphocytosis: - continue to follow up with heme/onc.  Reviewed recent cbc, cmp, iron panel. Significant elevated wbc and lymphocytes 11/08/2020   Orders Placed This Encounter  Procedures  . Wrist splint  . POCT HgB A1C   Meds ordered this encounter  Medications  . amLODipine (NORVASC) 5 MG tablet    Sig: Take 1 tablet (5 mg total) by mouth daily.    Dispense:  90 tablet    Refill:  1  . metoprolol tartrate (LOPRESSOR) 25 MG tablet    Sig: Take 0.5 tablets (12.5 mg total) by mouth 2 (two) times daily.    Dispense:  90 tablet    Refill:  1  . metFORMIN (GLUCOPHAGE-XR) 500 MG 24 hr tablet    Sig: TAKE 1 TABLET BY MOUTH EVERY MONDAY, WEDNESDAY,AND FRIDAY WITH EVENING MEAL    Dispense:  40 tablet    Refill:  3    Type 2 diabetes  . lisinopril (ZESTRIL) 40 MG tablet    Sig: Take 1 tablet (40 mg total) by mouth daily.    Dispense:  90 tablet    Refill:  1   Referral Orders  No referral(s) requested today    Electronically signed by: Howard Pouch, [place]

## 2020-12-21 NOTE — Patient Instructions (Addendum)
  Great to see you today.  I have refilled the medication(s) we provide.   If labs were collected, we will inform you of lab results once received either by echart message or telephone call.   - echart message- for normal results that have been seen by the patient already.   - telephone call: abnormal results or if patient has not viewed results in their echart.   Next appt 6 months.    Decreased amlodipine to 5 mg a day.  a1c was 5.3 today. Great job.

## 2020-12-27 ENCOUNTER — Ambulatory Visit: Payer: Medicare Other | Admitting: Podiatry

## 2020-12-28 ENCOUNTER — Other Ambulatory Visit: Payer: Self-pay

## 2020-12-28 ENCOUNTER — Ambulatory Visit: Payer: Medicare Other | Admitting: Podiatry

## 2020-12-28 ENCOUNTER — Encounter: Payer: Self-pay | Admitting: Podiatry

## 2020-12-28 DIAGNOSIS — B351 Tinea unguium: Secondary | ICD-10-CM

## 2020-12-28 DIAGNOSIS — M79676 Pain in unspecified toe(s): Secondary | ICD-10-CM | POA: Diagnosis not present

## 2020-12-28 DIAGNOSIS — D689 Coagulation defect, unspecified: Secondary | ICD-10-CM | POA: Diagnosis not present

## 2020-12-31 NOTE — Progress Notes (Signed)
Subjective: 80 y.o. returns the office today for painful, elongated, thickened toenails which he cannot trim himself. Denies any redness or drainage around the nails.  No open lesions he reports.  Denies any acute changes since last appointment and no new complaints today. Denies any systemic complaints such as fevers, chills, nausea, vomiting.   PCP: Howard Pouch A, DO Last seen 12/21/2020 Last A1c he reports was 5.3 on  12/21/2020   Objective: AAO 3, NAD DP/PT pulses palpable, CRT less than 3 seconds  Nails hypertrophic, dystrophic, elongated, brittle, discolored 10. There is tenderness overlying the nails 1-5 bilaterally. There is no surrounding erythema or drainage along the nail sites. No open lesions or pre-ulcerative lesions are identified. No pain with calf compression, swelling, warmth, erythema.  Assessment: Patient presents with symptomatic onychomycosis  Plan: -Treatment options including alternatives, risks, complications were discussed -Nails sharply debrided 10 without complication/bleeding. -Discussed daily foot inspection. If there are any changes, to call the office immediately.  -Follow-up in 3 months or sooner if any problems are to arise. In the meantime, encouraged to call the office with any questions, concerns, changes symptoms.  Celesta Gentile, DPM

## 2021-01-07 ENCOUNTER — Encounter: Payer: Self-pay | Admitting: Gastroenterology

## 2021-01-07 ENCOUNTER — Ambulatory Visit: Payer: Medicare Other | Admitting: Gastroenterology

## 2021-01-07 VITALS — BP 100/60 | HR 55 | Ht 68.0 in | Wt 187.0 lb

## 2021-01-07 DIAGNOSIS — Z800 Family history of malignant neoplasm of digestive organs: Secondary | ICD-10-CM

## 2021-01-07 DIAGNOSIS — Z86010 Personal history of colonic polyps: Secondary | ICD-10-CM | POA: Diagnosis not present

## 2021-01-07 MED ORDER — SUTAB 1479-225-188 MG PO TABS: 1.0000 | ORAL_TABLET | Freq: Once | ORAL | 0 refills | Status: AC

## 2021-01-07 NOTE — Progress Notes (Signed)
HPI :  80 year old male here for a follow-up visit to discuss surveillance colonoscopy.  He has a history of marginal zone B-cell lymphoma followed by oncology.  He has seen me in the past for colonoscopy and iron deficiency anemia.  He underwent EGD, colonoscopy, and capsule endoscopy for iron deficiency anemia back in 2018.  He had 6 small adenomas removed at that time.  The rest of his work-up is as outlined below.  He had a CT scan in 2019 showing rectal thickening concerning for neoplasm.  He had a flexible sigmoidoscopy in 2019 to evaluate that which was normal, no correlating findings to account for CTs changes.  Since that time he has been doing pretty well in regards to his bowel function.  Denies any problems with his bowels at all.  No blood in his stools.  Occasionally has a fleeting lower abdominal pain but he does not think related to his bowel habits.  He denies any anticoagulation other than a small baby aspirin.  His sister did have colon cancer in her 41s and he is concerned about stopping further surveillance exams.  He had an echocardiogram in 2019 showing an EF of 60 to 65%.  He denies any cardiopulmonary symptoms.  His white blood cell count has increased since it was last checked and is being monitored by oncology, he is currently not on any therapy for his lymphoma.  He has had this for some time and has been stable over the years for the most part.  We discussed how aggressive he wanted to be with surveillance colonoscopy if he wanted any more exams.  Prior work-up: Colonoscopy 04/18/2017 - The perianal and digital rectal examinations were normal. - Three sessile polyps were found in the ascending colon. The polyps were 3 mm in size. These polyps were removed with a cold biopsy forceps. Resection and retrieval were complete. - Two sessile polyps were found in the ascending colon. The polyps were 5 to 6 mm in size. These polyps were removed with a cold snare. Resection and  retrieval were complete. - A 3 mm polyp was found in the rectum. The polyp was sessile. The polyp was removed with a cold biopsy forceps. Resection and retrieval were complete. - Multiple small and large-mouthed diverticula were found in the entire colon. - The colon was redundant, could not intubate ileum despite multiple attempts. - Internal hemorrhoids were found during retroflexion. - The exam was otherwise without abnormality.  EGD 04/18/2017 - - A 2 cm hiatal hernia was present. - The exam of the esophagus was otherwise normal. - The entire examined stomach was normal. Biopsies were taken with a cold forceps for Helicobacter pylori testing. - The duodenal bulb and second portion of the duodenum were normal.  1. Surgical [P], gastric antrum and gastric body - CHRONIC ACTIVE GASTRITIS WITH HELICOBACTER PYLORI. - NO INTESTINAL METAPLASIA, DYSPLASIA, OR MALIGNANCY. 2. Surgical [P], ascending x 5, rectal x 1, polyp (6) - TUBULAR ADENOMA (X6 FRAGMENTS). - NO HIGH GRADE DYSPLASIA OR MALIGNANCY.  Treated with pylera Follow up stool test negative Consider repeat colonoscopy in 3 years  Capsule endoscopy 06/15/2017 - one small AVM, otherwise negative   Flex sig 08/12/2018 - The perianal and digital rectal examinations were normal. - Multiple medium-mouthed diverticula were found in the sigmoid colon. - The exam was otherwise without abnormality. No abnormalities to account for CT findings.      Past Medical History:  Diagnosis Date  . Anemia   . Arthritis   .  Cervical radiculopathy 12/22/2019  . Colonic polyp   . Concussion 03/05/2018  . Coronary artery disease    stents x 3 (03-2014)  . Diabetes mellitus   . Diverticulosis    throughout entire colon  . History of total right knee replacement 07/09/2018  . HTN (hypertension)   . Hypertriglyceridemia   . Ingrown toenail 10/21/2019  . Kidney stone   . Lymphocytosis   . Metatarsal fracture left   left 5th proximal phalanx    . Neck pain 02/19/2018  . OSA (obstructive sleep apnea)    "Mild" by records  . PONV (postoperative nausea and vomiting)    first time, no problem since then  . RBBB 01/2016  . Right inguinal hernia 02/03/2016   ? Noted 02/2015 sent to surgery   . Splenic marginal zone b-cell lymphoma ([place]) 12/18/2011  . Splenomegaly   . STEMI (ST elevation myocardial infarction) [facility])    August 2015  . Subarachnoid hematoma ([place]) 03/12/2018  . Subarachnoid hemorrhage ([place]) 03/05/2018     Past Surgical History:  Procedure Laterality Date  . CARDIAC CATHETERIZATION     stents x 3 (03/2014)  . CHOLECYSTECTOMY    . INGUINAL HERNIA REPAIR Right 05/27/2015   Procedure: LAPAROSCOPIC REPAIR RIGHT  INGUINAL HERNIA;  Surgeon: Greer Pickerel, MD;  [facility];  Service: General;  Laterality: Right;  . INSERTION OF MESH Right 05/27/2015   Procedure: INSERTION OF MESH;  Surgeon: Greer Pickerel, MD;  [facility];  Service: General;  Laterality: Right;  . LUMBAR DISC SURGERY    . TONSILLECTOMY AND ADENOIDECTOMY    . TOTAL KNEE ARTHROPLASTY Right 07/09/2018   Procedure: RIGHT TOTAL KNEE ARTHROPLASTY;  Surgeon: Garald Balding, MD;  Location: [place];  Service: Orthopedics;  Laterality: Right;   Family History  Problem Relation Age of Onset  . Kidney disease Sister   . Colon cancer Sister 34  . Colon cancer Brother   . Bone cancer Brother   . Arthritis Mother   . Breast cancer Mother   . Heart disease Mother   . Early death Father   . Arthritis Son   . Esophageal cancer Sister   . Bladder Cancer Sister   . Rectal cancer Neg Hx   . Stomach cancer Neg Hx    Social History   Tobacco Use  . Smoking status: Former Smoker    Packs/day: 1.00    Years: 20.00    Pack years: 20.00    Quit date: 08/14/1988    Years since quitting: 32.4  . Smokeless tobacco: Never Used  Vaping Use  . Vaping Use: Never used  Substance Use Topics  . Alcohol use: No  . Drug use: No   Current Outpatient Medications   Medication Sig Dispense Refill  . amLODipine (NORVASC) 5 MG tablet Take 1 tablet (5 mg total) by mouth daily. 90 tablet 1  . aspirin EC 81 MG tablet Take 81 mg by mouth daily.    Marland Kitchen atorvastatin (LIPITOR) 40 MG tablet Take 1 tablet (40 mg total) by mouth daily. 90 tablet 3  . diclofenac Sodium (VOLTAREN) 1 % GEL APPLY 2 GRAMS TO AFFECTED AREA 4 TIMES A DAY 600 g 1  . fluticasone (FLONASE) 50 MCG/ACT nasal spray SPRAY 2 SPRAYS INTO EACH NOSTRIL EVERY DAY 16 mL 1  . lisinopril (ZESTRIL) 40 MG tablet Take 1 tablet (40 mg total) by mouth daily. 90 tablet 1  . metFORMIN (GLUCOPHAGE-XR) 500 MG 24 hr tablet TAKE 1 TABLET BY  MOUTH EVERY MONDAY, WEDNESDAY,AND FRIDAY WITH EVENING MEAL 40 tablet 3  . metoprolol tartrate (LOPRESSOR) 25 MG tablet Take 0.5 tablets (12.5 mg total) by mouth 2 (two) times daily. 90 tablet 1  . nitroGLYCERIN (NITROSTAT) 0.4 MG SL tablet Place 1 tablet (0.4 mg total) under the tongue every 5 (five) minutes as needed for chest pain. 10 tablet 0   Current Facility-Administered Medications  Medication Dose Route Frequency Provider Last Rate Last Admin  . 0.9 %  sodium chloride infusion  500 mL Intravenous Once Daaron Dimarco, Carlota Raspberry, MD       No Known Allergies   Review of Systems: All systems reviewed and negative except where noted in HPI.   Lab Results  Component Value Date   WBC 31.0 (H) 11/08/2020   HGB 11.3 (L) 11/08/2020   HCT 35.0 (L) 11/08/2020   MCV 87.5 11/08/2020   PLT 185 11/08/2020    Lab Results  Component Value Date   CREATININE 0.79 11/08/2020   BUN 20 11/08/2020   NA 143 11/08/2020   K 3.7 11/08/2020   CL 105 11/08/2020   CO2 29 11/08/2020    Lab Results  Component Value Date   ALT 25 11/08/2020   AST 27 11/08/2020   ALKPHOS 75 11/08/2020   BILITOT 0.4 11/08/2020     Physical Exam: BP 100/60   Pulse (!) 55   Ht 5\' 8"  (1.727 m)   Wt 187 lb (84.8 kg)   BMI 28.43 kg/m  Constitutional: Pleasant,well-developed, male in no acute  distress. Abdominal: Soft, nondistended, nontender.  There are no masses palpable.  Extremities: no edema Neurological: Alert and oriented to person place and time. Psychiatric: Normal mood and affect. Behavior is normal.   ASSESSMENT AND PLAN: 80 year old male here for reassessment of following:  History of colon polyps Family history of colon cancer  He had 6 adenomas removed in 2018 and has a strong family history of colon cancer.  At his age with his comorbidities we discussed if he wanted to have any further colonoscopy exams.  We discussed what colonoscopy is, anesthesia, risks and benefits of both the procedure and anesthesia.  His lymphoma historically has been stable over the years although his white blood cell count has increased recently, he is being monitored by hematology, on no therapy at this time.  After discussion of all of these issues, he is not comfortable stopping colonoscopy at this time and would like to have 1 more exam before stopping further surveillance.  Assuming no high risk lesions, this would be his last colonoscopy.  He wished to proceed with colonoscopy and agreed with the plan, further recommendations pending results.  Kenvir Cellar, MD [facility]

## 2021-01-07 NOTE — Patient Instructions (Signed)
If you are age 80 or older, your body mass index should be between 23-30. Your Body mass index is 28.43 kg/m. If this is out of the aforementioned range listed, please consider follow up with your Primary Care Provider.  If you are age 71 or younger, your body mass index should be between 19-25. Your Body mass index is 28.43 kg/m. If this is out of the aformentioned range listed, please consider follow up with your Primary Care Provider.   __________________________________________________________  The [place] [facility] providers would like to encourage you to use [facility] to communicate with providers for non-urgent requests or questions.  Due to long hold times on the telephone, sending your provider a message by [facility] may be a faster and more efficient way to get a response.  Please allow 48 business hours for a response.  Please remember that this is for non-urgent requests.  _________________________________________________________________  Marcus Taylor have been scheduled for a colonoscopy. Please follow written instructions given to you at your visit today.  Please pick up your prep supplies at the pharmacy within the next 1-3 days. If you use inhalers (even only as needed), please bring them with you on the day of your procedure.   Thank you for entrusting me with your care and for choosing [facility], Dr. [place] Cellar

## 2021-01-14 ENCOUNTER — Other Ambulatory Visit: Payer: Self-pay | Admitting: Family Medicine

## 2021-01-18 ENCOUNTER — Ambulatory Visit: Payer: Medicare Other | Admitting: Family Medicine

## 2021-03-10 ENCOUNTER — Inpatient Hospital Stay: Payer: Medicare Other | Attending: Hematology and Oncology | Admitting: Hematology and Oncology

## 2021-03-10 ENCOUNTER — Encounter: Payer: Self-pay | Admitting: Hematology and Oncology

## 2021-03-10 ENCOUNTER — Other Ambulatory Visit: Payer: Self-pay

## 2021-03-10 ENCOUNTER — Inpatient Hospital Stay: Payer: Medicare Other

## 2021-03-10 VITALS — BP 124/50 | HR 64 | Temp 97.9°F | Resp 18 | Ht 68.0 in | Wt 182.4 lb

## 2021-03-10 DIAGNOSIS — R634 Abnormal weight loss: Secondary | ICD-10-CM | POA: Diagnosis not present

## 2021-03-10 DIAGNOSIS — E118 Type 2 diabetes mellitus with unspecified complications: Secondary | ICD-10-CM

## 2021-03-10 DIAGNOSIS — C8307 Small cell B-cell lymphoma, spleen: Secondary | ICD-10-CM

## 2021-03-10 DIAGNOSIS — C8300 Small cell B-cell lymphoma, unspecified site: Secondary | ICD-10-CM | POA: Diagnosis not present

## 2021-03-10 DIAGNOSIS — D696 Thrombocytopenia, unspecified: Secondary | ICD-10-CM | POA: Diagnosis not present

## 2021-03-10 DIAGNOSIS — D539 Nutritional anemia, unspecified: Secondary | ICD-10-CM

## 2021-03-10 DIAGNOSIS — D630 Anemia in neoplastic disease: Secondary | ICD-10-CM

## 2021-03-10 DIAGNOSIS — Z79899 Other long term (current) drug therapy: Secondary | ICD-10-CM | POA: Insufficient documentation

## 2021-03-10 LAB — URIC ACID: Uric Acid, Serum: 5.8 mg/dL (ref 3.7–8.6)

## 2021-03-10 LAB — CBC WITH DIFFERENTIAL/PLATELET
Abs Immature Granulocytes: 0.04 10*3/uL (ref 0.00–0.07)
Basophils Absolute: 0.1 10*3/uL (ref 0.0–0.1)
Basophils Relative: 0 %
Eosinophils Absolute: 0.2 10*3/uL (ref 0.0–0.5)
Eosinophils Relative: 1 %
HCT: 36.4 % — ABNORMAL LOW (ref 39.0–52.0)
Hemoglobin: 11.8 g/dL — ABNORMAL LOW (ref 13.0–17.0)
Immature Granulocytes: 0 %
Lymphocytes Relative: 83 %
Lymphs Abs: 21.1 10*3/uL — ABNORMAL HIGH (ref 0.7–4.0)
MCH: 28.3 pg (ref 26.0–34.0)
MCHC: 32.4 g/dL (ref 30.0–36.0)
MCV: 87.3 fL (ref 80.0–100.0)
Monocytes Absolute: 1.1 10*3/uL — ABNORMAL HIGH (ref 0.1–1.0)
Monocytes Relative: 4 %
Neutro Abs: 3 10*3/uL (ref 1.7–7.7)
Neutrophils Relative %: 12 %
Platelets: 129 10*3/uL — ABNORMAL LOW (ref 150–400)
RBC: 4.17 MIL/uL — ABNORMAL LOW (ref 4.22–5.81)
RDW: 15.9 % — ABNORMAL HIGH (ref 11.5–15.5)
WBC: 25.5 10*3/uL — ABNORMAL HIGH (ref 4.0–10.5)
nRBC: 0 % (ref 0.0–0.2)

## 2021-03-10 LAB — COMPREHENSIVE METABOLIC PANEL
ALT: 18 U/L (ref 0–44)
AST: 38 U/L (ref 15–41)
Albumin: 4.1 g/dL (ref 3.5–5.0)
Alkaline Phosphatase: 74 U/L (ref 38–126)
Anion gap: 7 (ref 5–15)
BUN: 17 mg/dL (ref 8–23)
CO2: 28 mmol/L (ref 22–32)
Calcium: 9.3 mg/dL (ref 8.9–10.3)
Chloride: 108 mmol/L (ref 98–111)
Creatinine, Ser: 0.81 mg/dL (ref 0.61–1.24)
GFR, Estimated: >60 mL/min (ref >60)
Glucose, Bld: 103 mg/dL — ABNORMAL HIGH (ref 70–99)
Potassium: 4.1 mmol/L (ref 3.5–5.1)
Sodium: 143 mmol/L (ref 135–145)
Total Bilirubin: 0.6 mg/dL (ref 0.3–1.2)
Total Protein: 6.3 g/dL — ABNORMAL LOW (ref 6.5–8.1)

## 2021-03-10 LAB — LACTATE DEHYDROGENASE: LDH: 142 U/L (ref 98–192)

## 2021-03-10 LAB — FERRITIN: Ferritin: 51 ng/mL (ref 24–336)

## 2021-03-10 LAB — IRON AND TIBC
Iron: 35 ug/dL — ABNORMAL LOW (ref 42–163)
Saturation Ratios: 13 % — ABNORMAL LOW (ref 20–55)
TIBC: 260 ug/dL (ref 202–409)
UIBC: 225 ug/dL (ref 117–376)

## 2021-03-10 LAB — SEDIMENTATION RATE: Sed Rate: 4 mm/hr (ref 0–16)

## 2021-03-10 NOTE — Assessment & Plan Note (Signed)
He has mild intermittent thrombocytopenia but not symptomatic Observe closely There is no contraindication for him to take antiplatelet agents

## 2021-03-10 NOTE — Assessment & Plan Note (Signed)
He has signs of iron deficiency The patient stated his food is devoid of meat I recommend oral iron supplement daily I plan to recheck iron studies again in 6 months With long-term use of metformin, I will also check vitamin B12 level in his next visit

## 2021-03-10 NOTE — Assessment & Plan Note (Signed)
He has fluctuation of his white blood cell count and lymphocyte count but overall asymptomatic His recent weight loss was intentional He had very mild thrombocytopenia but not symptomatic For now, I recommend close observation The patient is educated to watch for signs and symptoms of cancer recurrence I will see him again in 6 months for further follow-up

## 2021-03-10 NOTE — Progress Notes (Signed)
[place] OFFICE PROGRESS NOTE  Patient Care Team: [name], DO as PCP - General (Family Medicine) [name], MD (Inactive) as Consulting Physician (Gastroenterology) [name], MD as Consulting Physician (Hematology and Oncology) [name], MD as Referring Physician (Cardiology) [name], MD as Consulting Physician (Neurosurgery) [name], OD as Referring Physician Garald Balding, MD as Consulting Physician (Orthopedic Surgery) Trula Slade, DPM as Consulting Physician (Podiatry)  ASSESSMENT & PLAN:  Splenic marginal zone b-cell lymphoma [facility]) He has fluctuation of his white blood cell count and lymphocyte count but overall asymptomatic His recent weight loss was intentional He had very mild thrombocytopenia but not symptomatic For now, I recommend close observation The patient is educated to watch for signs and symptoms of cancer recurrence I will see him again in 6 months for further follow-up  Deficiency anemia He has signs of iron deficiency The patient stated his food is devoid of meat I recommend oral iron supplement daily I plan to recheck iron studies again in 6 months With long-term use of metformin, I will also check vitamin B12 level in his next visit  Thrombocytopenia ([place]) He has mild intermittent thrombocytopenia but not symptomatic Observe closely There is no contraindication for him to take antiplatelet agents  Orders Placed This Encounter  Procedures   CBC with Differential/Platelet    Standing Status:   Future    Standing Expiration Date:   03/10/2022   Ferritin    Standing Status:   Future    Standing Expiration Date:   03/10/2022   Iron and TIBC    Standing Status:   Future    Standing Expiration Date:   03/10/2022   Vitamin B12    Standing Status:   Future    Standing Expiration Date:   03/10/2022   Comprehensive metabolic panel    Standing Status:   Future    Standing Expiration Date:    03/10/2022    All questions were answered. The patient knows to call the clinic with any problems, questions or concerns. The total time spent in the appointment was 20 minutes encounter with patients including review of chart and various tests results, discussions about plan of care and coordination of care plan   [name], MD 03/10/2021 11:04 AM  INTERVAL HISTORY: Please see below for problem oriented charting. He returns for further follow-up for anemia and splenic marginal zone lymphoma He is doing well Denies abnormal lymphadenopathy He has intentional weight loss through dietary modification The patient denies any recent signs or symptoms of bleeding such as spontaneous epistaxis, hematuria or hematochezia.   SUMMARY OF ONCOLOGIC HISTORY: Oncology History  Splenic marginal zone b-cell lymphoma ([place])  12/18/2011 Initial Diagnosis   Splenic marginal zone b-cell lymphoma ([place])    11/08/2020 Cancer Staging   Staging form: Hodgkin and Non-Hodgkin Lymphoma, AJCC 7th Edition - Clinical: S - Spleen - Signed by [name], MD on 11/08/2020  Staged by: Managing physician  Stage prefix: Initial diagnosis      REVIEW OF SYSTEMS:   Constitutional: Denies fevers, chills or abnormal weight loss Eyes: Denies blurriness of vision Ears, nose, mouth, throat, and face: Denies mucositis or sore throat Respiratory: Denies cough, dyspnea or wheezes Cardiovascular: Denies palpitation, chest discomfort or lower extremity swelling Gastrointestinal:  Denies nausea, heartburn or change in bowel habits Skin: Denies abnormal skin rashes Lymphatics: Denies new lymphadenopathy or easy bruising Neurological:Denies numbness, tingling or new weaknesses Behavioral/Psych: Mood is stable, no new changes  All other systems were reviewed with the patient and are negative.  I have reviewed the past medical history, past surgical history, social history and family history with the patient and they are  unchanged from previous note.  ALLERGIES:  has No Known Allergies.  MEDICATIONS:  Current Outpatient Medications  Medication Sig Dispense Refill   ferrous sulfate 324 MG TBEC Take 324 mg by mouth daily.     amLODipine (NORVASC) 5 MG tablet Take 1 tablet (5 mg total) by mouth daily. 90 tablet 1   aspirin EC 81 MG tablet Take 81 mg by mouth daily.     atorvastatin (LIPITOR) 40 MG tablet Take 1 tablet (40 mg total) by mouth daily. 90 tablet 3   diclofenac Sodium (VOLTAREN) 1 % GEL APPLY 2 GRAMS TO AFFECTED AREA 4 TIMES A DAY 600 g 1   fluticasone (FLONASE) 50 MCG/ACT nasal spray SPRAY 2 SPRAYS INTO EACH NOSTRIL EVERY DAY 16 mL 1   lisinopril (ZESTRIL) 40 MG tablet Take 1 tablet (40 mg total) by mouth daily. 90 tablet 1   metFORMIN (GLUCOPHAGE-XR) 500 MG 24 hr tablet TAKE 1 TABLET BY MOUTH EVERY MONDAY, WEDNESDAY,AND FRIDAY WITH EVENING MEAL 40 tablet 3   metoprolol tartrate (LOPRESSOR) 25 MG tablet Take 0.5 tablets (12.5 mg total) by mouth 2 (two) times daily. 90 tablet 1   nitroGLYCERIN (NITROSTAT) 0.4 MG SL tablet Place 1 tablet (0.4 mg total) under the tongue every 5 (five) minutes as needed for chest pain. 10 tablet 0   Current Facility-Administered Medications  Medication Dose Route Frequency Provider Last Rate Last Admin   0.9 %  sodium chloride infusion  500 mL Intravenous Once Armbruster, Carlota Raspberry, MD        PHYSICAL EXAMINATION: ECOG PERFORMANCE STATUS: 0 - Asymptomatic  Vitals:   03/10/21 0831 03/10/21 0832  BP: (!) 124/54 (!) 124/50  Pulse: 64   Resp: 18   Temp: 97.9 F (36.6 C)   SpO2: 98%    Filed Weights   03/10/21 0831  Weight: 182 lb 6.4 oz (82.7 kg)    GENERAL:alert, no distress and comfortable SKIN: skin color, texture, turgor are normal, no rashes or significant lesions EYES: normal, Conjunctiva are pink and non-injected, sclera clear OROPHARYNX:no exudate, no erythema and lips, buccal mucosa, and tongue normal  NECK: supple, thyroid normal size,  non-tender, without nodularity LYMPH:  no palpable lymphadenopathy in the cervical, axillary or inguinal LUNGS: clear to auscultation and percussion with normal breathing effort HEART: regular rate & rhythm and no murmurs and no lower extremity edema ABDOMEN:abdomen soft, non-tender and normal bowel sounds Musculoskeletal:no cyanosis of digits and no clubbing  NEURO: alert & oriented x 3 with fluent speech, no focal motor/sensory deficits  LABORATORY DATA:  I have reviewed the data as listed    Component Value Date/Time   NA 143 03/10/2021 0827   NA 144 05/31/2015 1404   K 4.1 03/10/2021 0827   K 4.7 05/31/2015 1404   CL 108 03/10/2021 0827   CL 102 10/07/2012 1013   CO2 28 03/10/2021 0827   CO2 29 05/31/2015 1404   GLUCOSE 103 (H) 03/10/2021 0827   GLUCOSE 99 05/31/2015 1404   GLUCOSE 199 (H) 10/07/2012 1013   BUN 17 03/10/2021 0827   BUN 18.3 05/31/2015 1404   CREATININE 0.81 03/10/2021 0827   CREATININE 0.86 03/13/2017 0759   CREATININE 0.9 05/31/2015 1404   CALCIUM 9.3 03/10/2021 0827   CALCIUM 9.2 05/31/2015 1404   PROT 6.3 (L) 03/10/2021  0827   PROT 6.3 (L) 05/31/2015 1404   ALBUMIN 4.1 03/10/2021 0827   ALBUMIN 3.9 05/31/2015 1404   AST 38 03/10/2021 0827   AST 28 05/31/2015 1404   ALT 18 03/10/2021 0827   ALT 23 05/31/2015 1404   ALKPHOS 74 03/10/2021 0827   ALKPHOS 79 05/31/2015 1404   BILITOT 0.6 03/10/2021 0827   BILITOT 0.46 05/31/2015 1404   GFRNONAA >60 03/10/2021 0827   GFRNONAA 85 03/13/2017 0759   GFRAA >60 05/03/2020 0857   GFRAA >89 03/13/2017 0759    No results found for: SPEP, UPEP  Lab Results  Component Value Date   WBC 25.5 (H) 03/10/2021   NEUTROABS 3.0 03/10/2021   HGB 11.8 (L) 03/10/2021   HCT 36.4 (L) 03/10/2021   MCV 87.3 03/10/2021   PLT 129 (L) 03/10/2021      Chemistry      Component Value Date/Time   NA 143 03/10/2021 0827   NA 144 05/31/2015 1404   K 4.1 03/10/2021 0827   K 4.7 05/31/2015 1404   CL 108 03/10/2021  0827   CL 102 10/07/2012 1013   CO2 28 03/10/2021 0827   CO2 29 05/31/2015 1404   BUN 17 03/10/2021 0827   BUN 18.3 05/31/2015 1404   CREATININE 0.81 03/10/2021 0827   CREATININE 0.86 03/13/2017 0759   CREATININE 0.9 05/31/2015 1404      Component Value Date/Time   CALCIUM 9.3 03/10/2021 0827   CALCIUM 9.2 05/31/2015 1404   ALKPHOS 74 03/10/2021 0827   ALKPHOS 79 05/31/2015 1404   AST 38 03/10/2021 0827   AST 28 05/31/2015 1404   ALT 18 03/10/2021 0827   ALT 23 05/31/2015 1404   BILITOT 0.6 03/10/2021 0827   BILITOT 0.46 05/31/2015 1404

## 2021-03-28 ENCOUNTER — Other Ambulatory Visit: Payer: Self-pay

## 2021-03-28 ENCOUNTER — Encounter: Payer: Self-pay | Admitting: Gastroenterology

## 2021-03-28 ENCOUNTER — Ambulatory Visit (AMBULATORY_SURGERY_CENTER): Payer: Medicare Other | Admitting: Gastroenterology

## 2021-03-28 VITALS — BP 111/63 | HR 50 | Temp 97.8°F | Resp 13 | Ht 68.0 in | Wt 187.0 lb

## 2021-03-28 DIAGNOSIS — Z800 Family history of malignant neoplasm of digestive organs: Secondary | ICD-10-CM

## 2021-03-28 DIAGNOSIS — E119 Type 2 diabetes mellitus without complications: Secondary | ICD-10-CM | POA: Diagnosis not present

## 2021-03-28 DIAGNOSIS — D123 Benign neoplasm of transverse colon: Secondary | ICD-10-CM | POA: Diagnosis not present

## 2021-03-28 DIAGNOSIS — Z86010 Personal history of colonic polyps: Secondary | ICD-10-CM

## 2021-03-28 DIAGNOSIS — G4733 Obstructive sleep apnea (adult) (pediatric): Secondary | ICD-10-CM | POA: Diagnosis not present

## 2021-03-28 MED ORDER — SODIUM CHLORIDE 0.9 % IV SOLN: 500.0000 mL | Freq: Once | INTRAVENOUS | Status: DC

## 2021-03-28 NOTE — Patient Instructions (Signed)
 Handouts on polyps,diverticulosis ,& hemorrhoids given to you today  Await pathology results on polyps removed    YOU HAD AN ENDOSCOPIC PROCEDURE TODAY AT THE [facility]:   Refer to the procedure report that was given to you for any specific questions about what was found during the examination.  If the procedure report does not answer your questions, please call your gastroenterologist to clarify.  If you requested that your care partner not be given the details of your procedure findings, then the procedure report has been included in a sealed envelope for you to review at your convenience later.  YOU SHOULD EXPECT: Some feelings of bloating in the abdomen. Passage of more gas than usual.  Walking can help get rid of the air that was put into your GI tract during the procedure and reduce the bloating. If you had a lower endoscopy (such as a colonoscopy or flexible sigmoidoscopy) you may notice spotting of blood in your stool or on the toilet paper. If you underwent a bowel prep for your procedure, you may not have a normal bowel movement for a few days.  Please Note:  You might notice some irritation and congestion in your nose or some drainage.  This is from the oxygen used during your procedure.  There is no need for concern and it should clear up in a day or so.  SYMPTOMS TO REPORT IMMEDIATELY:   Following lower endoscopy (colonoscopy or flexible sigmoidoscopy):  Excessive amounts of blood in the stool  Significant tenderness or worsening of abdominal pains  Swelling of the abdomen that is new, acute  Fever of 100F or higher    For urgent or emergent issues, a gastroenterologist can be reached at any hour by calling [phone]. Do not use MyChart messaging for urgent concerns.    DIET:  We do recommend a small meal at first, but then you may proceed to your regular diet.  Drink plenty of fluids but you should avoid alcoholic beverages for 24 hours.  ACTIVITY:   You should plan to take it easy for the rest of today and you should NOT DRIVE or use heavy machinery until tomorrow (because of the sedation medicines used during the test).    FOLLOW UP: Our staff will call the number listed on your records 48-72 hours following your procedure to check on you and address any questions or concerns that you may have regarding the information given to you following your procedure. If we do not reach you, we will leave a message.  We will attempt to reach you two times.  During this call, we will ask if you have developed any symptoms of COVID 19. If you develop any symptoms (ie: fever, flu-like symptoms, shortness of breath, cough etc.) before then, please call [phone].  If you test positive for Covid 19 in the 2 weeks post procedure, please call and report this information to us.    If any biopsies were taken you will be contacted by phone or by letter within the next 1-3 weeks.  Please call us at [phone] if you have not heard about the biopsies in 3 weeks.    SIGNATURES/CONFIDENTIALITY: You and/or your care partner have signed paperwork which will be entered into your electronic medical record.  These signatures attest to the fact that that the information above on your After Visit Summary has been reviewed and is understood.  Full responsibility of the confidentiality of this discharge information lies with you   and/or your care-partner. 

## 2021-03-28 NOTE — Progress Notes (Signed)
VS by DT  Pt's states no medical or surgical changes since previsit or office visit.  

## 2021-03-28 NOTE — Progress Notes (Signed)
Called to room to assist during endoscopic procedure.  Patient ID and intended procedure confirmed with present staff. Received instructions for my participation in the procedure from the performing physician.  

## 2021-03-28 NOTE — Progress Notes (Signed)
HPI :  80 y/o male here for surveillance colonoscopy - last exam 2018, 6 adenomas removed, also with strong FH of colon cancer. No bowel changes. He wishes to have one more exam prior to stopping surveillance   Past Medical History:  Diagnosis Date   Anemia    Arthritis    Cervical radiculopathy 12/22/2019   Colonic polyp    Concussion 03/05/2018   Coronary artery disease    stents x 3 (03-2014)   Diabetes mellitus    Diverticulosis    throughout entire colon   History of total right knee replacement 07/09/2018   HTN (hypertension)    Hypertriglyceridemia    Ingrown toenail 10/21/2019   Kidney stone    Lymphocytosis    Metatarsal fracture left   left 5th proximal phalanx    Neck pain 02/19/2018   OSA (obstructive sleep apnea)    "Mild" by records   PONV (postoperative nausea and vomiting)    first time, no problem since then   RBBB 01/2016   Right inguinal hernia 02/03/2016   ? Noted 02/2015 sent to surgery    Splenic marginal zone b-cell lymphoma ([place]) 12/18/2011   Splenomegaly    STEMI (ST elevation myocardial infarction) [facility])    August 2015   Subarachnoid hematoma ([place]) 03/12/2018   Subarachnoid hemorrhage ([place]) 03/05/2018     Past Surgical History:  Procedure Laterality Date   CARDIAC CATHETERIZATION     stents x 3 (03/2014)   CHOLECYSTECTOMY     INGUINAL HERNIA REPAIR Right 05/27/2015   Procedure: LAPAROSCOPIC REPAIR RIGHT  INGUINAL HERNIA;  Surgeon: Greer Pickerel, MD;  Location: [facility];  Service: General;  Laterality: Right;   INSERTION OF MESH Right 05/27/2015   Procedure: INSERTION OF MESH;  Surgeon: Greer Pickerel, MD;  Location: [facility];  Service: General;  Laterality: Right;   LUMBAR DISC SURGERY     TONSILLECTOMY AND ADENOIDECTOMY     TOTAL KNEE ARTHROPLASTY Right 07/09/2018   Procedure: RIGHT TOTAL KNEE ARTHROPLASTY;  Surgeon: Garald Balding, MD;  Location: [place];  Service: Orthopedics;  Laterality: Right;   Family History  Problem Relation Age of Onset    Kidney disease Sister    Colon cancer Sister 27   Colon cancer Brother    Bone cancer Brother    Arthritis Mother    Breast cancer Mother    Heart disease Mother    Early death Father    Arthritis Son    Esophageal cancer Sister    Bladder Cancer Sister    Rectal cancer Neg Hx    Stomach cancer Neg Hx    Social History   Tobacco Use   Smoking status: Former    Packs/day: 1.00    Years: 20.00    Pack years: 20.00    Types: Cigarettes    Quit date: 08/14/1988    Years since quitting: 32.6   Smokeless tobacco: Never  Vaping Use   Vaping Use: Never used  Substance Use Topics   Alcohol use: No   Drug use: No   Current Outpatient Medications  Medication Sig Dispense Refill   amLODipine (NORVASC) 5 MG tablet Take 1 tablet (5 mg total) by mouth daily. 90 tablet 1   aspirin EC 81 MG tablet Take 81 mg by mouth daily.     atorvastatin (LIPITOR) 40 MG tablet Take 1 tablet (40 mg total) by mouth daily. 90 tablet 3   diclofenac Sodium (VOLTAREN) 1 % GEL APPLY 2 GRAMS TO AFFECTED  AREA 4 TIMES A DAY 600 g 1   ferrous sulfate 324 MG TBEC Take 324 mg by mouth daily.     lisinopril (ZESTRIL) 40 MG tablet Take 1 tablet (40 mg total) by mouth daily. 90 tablet 1   metFORMIN (GLUCOPHAGE-XR) 500 MG 24 hr tablet TAKE 1 TABLET BY MOUTH EVERY MONDAY, WEDNESDAY,AND FRIDAY WITH EVENING MEAL 40 tablet 3   metoprolol tartrate (LOPRESSOR) 25 MG tablet Take 0.5 tablets (12.5 mg total) by mouth 2 (two) times daily. 90 tablet 1   fluticasone (FLONASE) 50 MCG/ACT nasal spray SPRAY 2 SPRAYS INTO EACH NOSTRIL EVERY DAY 16 mL 1   nitroGLYCERIN (NITROSTAT) 0.4 MG SL tablet Place 1 tablet (0.4 mg total) under the tongue every 5 (five) minutes as needed for chest pain. (Patient not taking: Reported on 03/28/2021) 10 tablet 0   Current Facility-Administered Medications  Medication Dose Route Frequency Provider Last Rate Last Admin   0.9 %  sodium chloride infusion  500 mL Intravenous Once Silviano Neuser, Carlota Raspberry, MD        0.9 %  sodium chloride infusion  500 mL Intravenous Once Cheston Coury, Carlota Raspberry, MD       No Known Allergies   Review of Systems: All systems reviewed and negative except where noted in HPI.    No results found.  Physical Exam: BP (!) 119/49   Pulse (!) 51   Temp 97.8 F (36.6 C) (Skin)   Ht '5\' 8"'$  (1.727 m)   Wt 187 lb (84.8 kg)   SpO2 98%   BMI 28.43 kg/m  Constitutional: Pleasant,well-developed, male in no acute distress. Cardiovascular: Normal rate, regular rhythm.  Pulmonary/chest: Effort normal and breath sounds normal. No wheezing, rales or rhonchi. Abdominal: Soft, nondistended, nontender.  Extremities: no edema   ASSESSMENT AND PLAN:  80 y/o male here for surveillance colonoscopy - last exam 2018, 6 adenomas removed, also with strong FH of colon cancer. No bowel changes. He wishes to have one more exam prior to stopping surveillance.  Discussed risks / benefits of colonoscopy and anesthesia and he wishes to proceed.   Jolly Mango, MD [facility]

## 2021-03-28 NOTE — Progress Notes (Signed)
To PACU, VSS. Report to [email or web address] 

## 2021-03-28 NOTE — Op Note (Signed)
[place] Patient Name: Marcus Taylor Procedure Date: 03/28/2021 1:27 PM MRN: [id number] Endoscopist: Remo Lipps P. Havery Moros , MD Age: 80 Referring MD:  Date of Birth: November 26, 1940 Gender: Male Account #: [id number] Procedure:                Colonoscopy Indications:              High risk colon cancer surveillance: Personal                            history of colonic polyps (6 adenomas in 2018),                            family history of colon cancer Medicines:                Monitored Anesthesia Care Procedure:                Pre-Anesthesia Assessment:                           - Prior to the procedure, a History and Physical                            was performed, and patient medications and                            allergies were reviewed. The patient's tolerance of                            previous anesthesia was also reviewed. The risks                            and benefits of the procedure and the sedation                            options and risks were discussed with the patient.                            All questions were answered, and informed consent                            was obtained. Prior Anticoagulants: The patient has                            taken no previous anticoagulant or antiplatelet                            agents. ASA Grade Assessment: III - A patient with                            severe systemic disease. After reviewing the risks                            and benefits, the patient was deemed in  satisfactory condition to undergo the procedure.                           After obtaining informed consent, the colonoscope                            was passed under direct vision. Throughout the                            procedure, the patient's blood pressure, pulse, and                            oxygen saturations were monitored continuously. The                            CF [id number] was  introduced through the anus                            and advanced to the the cecum, identified by                            appendiceal orifice and ileocecal valve. The                            colonoscopy was performed without difficulty. The                            patient tolerated the procedure well. The quality                            of the bowel preparation was good. The ileocecal                            valve, appendiceal orifice, and rectum were                            photographed. Scope In: 1:33:57 PM Scope Out: 1:53:34 PM Scope Withdrawal Time: 0 hours 14 minutes 44 seconds  Total Procedure Duration: 0 hours 19 minutes 37 seconds  Findings:                 The perianal and digital rectal examinations were                            normal.                           Many medium-mouthed diverticula were found in the                            left colon, transverse, and right colon.                           Four sessile polyps were found in the transverse  colon. The polyps were 3 to 5 mm in size. These                            polyps were removed with a cold snare. Resection                            and retrieval were complete.                           Internal hemorrhoids were found during                            retroflexion. The hemorrhoids were small.                           The exam was otherwise without abnormality. Complications:            No immediate complications. Estimated blood loss:                            Minimal. Estimated Blood Loss:     Estimated blood loss was minimal. Impression:               - Diverticulosis in the left colon, transverse, and                            in the right colon.                           - Four 3 to 5 mm polyps in the transverse colon,                            removed with a cold snare. Resected and retrieved.                           - Internal hemorrhoids.                            - The examination was otherwise normal. Recommendation:           - Patient has a contact number available for                            emergencies. The signs and symptoms of potential                            delayed complications were discussed with the                            patient. Return to normal activities tomorrow.                            Written discharge instructions were provided to the                            patient.                           -  Resume previous diet.                           - Continue present medications.                           - Await pathology results.                           - Likely no further surveillance exams are needed                            due to age Carlota Raspberry. Alya Smaltz, MD 03/28/2021 1:57:31 PM This report has been signed electronically.

## 2021-03-29 ENCOUNTER — Encounter: Payer: Self-pay | Admitting: Podiatry

## 2021-03-29 ENCOUNTER — Ambulatory Visit: Payer: Medicare Other | Admitting: Podiatry

## 2021-03-29 ENCOUNTER — Other Ambulatory Visit: Payer: Self-pay

## 2021-03-29 DIAGNOSIS — M79676 Pain in unspecified toe(s): Secondary | ICD-10-CM | POA: Diagnosis not present

## 2021-03-29 DIAGNOSIS — Q828 Other specified congenital malformations of skin: Secondary | ICD-10-CM | POA: Diagnosis not present

## 2021-03-29 DIAGNOSIS — B351 Tinea unguium: Secondary | ICD-10-CM | POA: Diagnosis not present

## 2021-03-29 NOTE — Progress Notes (Signed)
Subjective: 80 y.o. returns the office today for painful, elongated, thickened toenails which he cannot trim himself. Denies any redness or drainage around the nails.  He has been using a toe separator between his big toe and second toe on the right foot which is been helpful.  No open sores that he reports.  Denies any acute changes since last appointment and no new complaints today. Denies any systemic complaints such as fevers, chills, nausea, vomiting.   PCP: Howard Pouch A, DO Last seen 12/21/2020 Last A1c he reports was 5.3 on  12/21/2020   Objective: AAO 3, NAD DP/PT pulses palpable, CRT less than 3 seconds  Nails hypertrophic, dystrophic, elongated, brittle, discolored 10. There is tenderness overlying the nails 1-5 bilaterally. There is no surrounding erythema or drainage along the nail sites. Minimal hyperkeratotic tissue in the medial second toe DIPJ as well as lateral hallux IPJ, there is a bunion putting pressure.  There is no underlying ulceration drainage or signs of infection. No pain with calf compression, swelling, warmth, erythema.  Assessment: Patient presents with symptomatic onychomycosis  Plan: -Treatment options including alternatives, risks, complications were discussed -Nails sharply debrided 10 without complication/bleeding. -Debrided the mild hyperkeratotic tissue with any complications or bleeding today.  Continue with toe separators. -Discussed daily foot inspection. If there are any changes, to call the office immediately.  -Follow-up in 3 months or sooner if any problems are to arise. In the meantime, encouraged to call the office with any questions, concerns, changes symptoms.  Celesta Gentile, DPM

## 2021-03-30 ENCOUNTER — Telehealth: Payer: Self-pay

## 2021-03-30 NOTE — Telephone Encounter (Signed)
  Follow up Call-  Call back number 03/28/2021 08/12/2018  Post procedure Call Back phone  # [phone]  Permission to leave phone message Yes Yes  Some recent data might be hidden     Patient questions:  Do you have a fever, pain , or abdominal swelling? No. Pain Score  0 *  Have you tolerated food without any problems? Yes.    Have you been able to return to your normal activities? Yes.    Do you have any questions about your discharge instructions: Diet   No. Medications  No. Follow up visit  No.  Do you have questions or concerns about your Care? No.  Actions: * If pain score is 4 or above: No action needed, pain <4.

## 2021-03-31 ENCOUNTER — Other Ambulatory Visit: Payer: Self-pay

## 2021-03-31 MED ORDER — NITROGLYCERIN 0.4 MG SL SUBL: 0.4000 mg | SUBLINGUAL_TABLET | SUBLINGUAL | 0 refills | Status: AC | PRN

## 2021-05-19 DIAGNOSIS — I491 Atrial premature depolarization: Secondary | ICD-10-CM | POA: Diagnosis not present

## 2021-05-19 DIAGNOSIS — R1013 Epigastric pain: Secondary | ICD-10-CM | POA: Diagnosis not present

## 2021-05-19 DIAGNOSIS — K219 Gastro-esophageal reflux disease without esophagitis: Secondary | ICD-10-CM | POA: Diagnosis not present

## 2021-05-19 DIAGNOSIS — I4510 Unspecified right bundle-branch block: Secondary | ICD-10-CM | POA: Diagnosis not present

## 2021-05-19 DIAGNOSIS — I2510 Atherosclerotic heart disease of native coronary artery without angina pectoris: Secondary | ICD-10-CM | POA: Diagnosis not present

## 2021-06-23 ENCOUNTER — Ambulatory Visit: Payer: Medicare Other | Admitting: Family Medicine

## 2021-06-28 ENCOUNTER — Other Ambulatory Visit: Payer: Self-pay

## 2021-06-28 DIAGNOSIS — I10 Essential (primary) hypertension: Secondary | ICD-10-CM

## 2021-06-28 MED ORDER — METOPROLOL TARTRATE 25 MG PO TABS: 12.5000 mg | ORAL_TABLET | Freq: Two times a day (BID) | ORAL | 0 refills | Status: DC

## 2021-06-30 ENCOUNTER — Other Ambulatory Visit: Payer: Self-pay

## 2021-06-30 ENCOUNTER — Ambulatory Visit: Payer: Medicare Other | Admitting: Podiatry

## 2021-06-30 DIAGNOSIS — B351 Tinea unguium: Secondary | ICD-10-CM

## 2021-06-30 DIAGNOSIS — M79676 Pain in unspecified toe(s): Secondary | ICD-10-CM | POA: Diagnosis not present

## 2021-06-30 DIAGNOSIS — D689 Coagulation defect, unspecified: Secondary | ICD-10-CM

## 2021-06-30 DIAGNOSIS — L84 Corns and callosities: Secondary | ICD-10-CM

## 2021-07-02 NOTE — Progress Notes (Signed)
Subjective: 80 y.o. returns the office today for painful, elongated, thickened toenails which he cannot trim himself. Denies any redness or drainage around the nails.  He has been using a toe separator between his big toe and second toe on the right foot which is been helpful. It feels much better with the separator but when he does not wear it, the 2nd toe will hurt. No open lesions.   PCP: Howard Pouch A, DO Last seen 12/21/2020 Last A1c he reports was 5.3 on  12/21/2020  Objective: AAO 3, NAD DP/PT pulses palpable, CRT less than 3 seconds  Nails hypertrophic, dystrophic, elongated, brittle, discolored 10. There is tenderness overlying the nails 1-5 bilaterally. There is no surrounding erythema or drainage along the nail sites. Minimal hyperkeratotic tissue in the medial second toe DIPJ as well as lateral hallux IPJ, there is a bunion putting pressure.  There is no underlying ulceration drainage or signs of infection. No pain with calf compression, swelling, warmth, erythema.  Assessment: Patient presents with symptomatic onychomycosis  Plan: -Treatment options including alternatives, risks, complications were discussed -Nails sharply debrided 10 without complication/bleeding. -Sharply debrided the mild hyperkeratotic tissue with any complications or bleeding today x 1.  Continue with toe separators. -Discussed daily foot inspection. If there are any changes, to call the office immediately.  -Follow-up in 3 months or sooner if any problems are to arise. In the meantime, encouraged to call the office with any questions, concerns, changes symptoms.  Celesta Gentile, DPM

## 2021-07-03 ENCOUNTER — Other Ambulatory Visit: Payer: Self-pay | Admitting: Family Medicine

## 2021-07-04 ENCOUNTER — Other Ambulatory Visit: Payer: Self-pay

## 2021-07-04 ENCOUNTER — Ambulatory Visit (INDEPENDENT_AMBULATORY_CARE_PROVIDER_SITE_OTHER): Payer: Medicare Other | Admitting: Family Medicine

## 2021-07-04 ENCOUNTER — Encounter: Payer: Self-pay | Admitting: Family Medicine

## 2021-07-04 VITALS — BP 125/77 | HR 64 | Temp 97.5°F | Ht 68.0 in | Wt 175.0 lb

## 2021-07-04 DIAGNOSIS — E1169 Type 2 diabetes mellitus with other specified complication: Secondary | ICD-10-CM | POA: Diagnosis not present

## 2021-07-04 DIAGNOSIS — Z23 Encounter for immunization: Secondary | ICD-10-CM | POA: Diagnosis not present

## 2021-07-04 DIAGNOSIS — I252 Old myocardial infarction: Secondary | ICD-10-CM

## 2021-07-04 DIAGNOSIS — I2510 Atherosclerotic heart disease of native coronary artery without angina pectoris: Secondary | ICD-10-CM

## 2021-07-04 DIAGNOSIS — E785 Hyperlipidemia, unspecified: Secondary | ICD-10-CM | POA: Diagnosis not present

## 2021-07-04 DIAGNOSIS — I10 Essential (primary) hypertension: Secondary | ICD-10-CM

## 2021-07-04 DIAGNOSIS — C8307 Small cell B-cell lymphoma, spleen: Secondary | ICD-10-CM

## 2021-07-04 DIAGNOSIS — D500 Iron deficiency anemia secondary to blood loss (chronic): Secondary | ICD-10-CM

## 2021-07-04 LAB — POCT GLYCOSYLATED HEMOGLOBIN (HGB A1C)
HbA1c POC (<> result, manual entry): 5.4 % (ref 4.0–5.6)
HbA1c, POC (controlled diabetic range): 5.4 % (ref 0.0–7.0)
HbA1c, POC (prediabetic range): 5.4 % — AB (ref 5.7–6.4)
Hemoglobin A1C: 5.4 % (ref 4.0–5.6)

## 2021-07-04 MED ORDER — AMLODIPINE BESYLATE 5 MG PO TABS: 5.0000 mg | ORAL_TABLET | Freq: Every day | ORAL | 1 refills | Status: DC

## 2021-07-04 MED ORDER — METOPROLOL TARTRATE 25 MG PO TABS: 12.5000 mg | ORAL_TABLET | Freq: Two times a day (BID) | ORAL | 1 refills | Status: DC

## 2021-07-04 MED ORDER — ATORVASTATIN CALCIUM 40 MG PO TABS: 40.0000 mg | ORAL_TABLET | Freq: Every day | ORAL | 3 refills | Status: DC

## 2021-07-04 MED ORDER — LISINOPRIL 40 MG PO TABS: 40.0000 mg | ORAL_TABLET | Freq: Every day | ORAL | 1 refills | Status: DC

## 2021-07-04 NOTE — Patient Instructions (Signed)
Great to see you today.  I have refilled the medication(s) we provide.   If labs were collected, we will inform you of lab results once received either by echart message or telephone call.   - echart message- for normal results that have been seen by the patient already.   - telephone call: abnormal results or if patient has not viewed results in their echart.  

## 2021-07-04 NOTE — Progress Notes (Signed)
Patient ID: Marcus Taylor, male   DOB: 1941/01/04, 80 y.o.   MRN: [id number]      Patient ID: Marcus Taylor, male  DOB: 11/19/1940, 80 y.o.   MRN: [id number] Patient Care Team    Relationship Specialty Notifications Start End  [name], DO PCP - General Family Medicine  01/26/16   [name], MD (Inactive) Consulting Physician Gastroenterology  01/26/16   [name], MD Consulting Physician Hematology and Oncology  01/26/16   [name], MD Referring Physician Cardiology  01/26/16   [name], MD Consulting Physician Neurosurgery  01/27/16   [name], OD Referring Physician   05/03/16    Comment: opthalmology   Garald Balding, MD Consulting Physician Orthopedic Surgery  06/14/18   Trula Slade, DPM Consulting Physician Podiatry  06/14/18    Chief Complaint  Patient presents with   Hypertension    [place]; pt is fasting    Subjective: Marcus Taylor is a 80 y.o. male present for follow up on  Hypertension/H/o MI/CAD/stentsx3/iron deficiency anemia: Pt routinely follows with cardiology at [facility], Dr. [name]. Pt has had stent placement and MI in 2015. He is compliant  Lipitor, lisinopril 40 mg, lopressor 12.5 (BID), amlodipine 5 mg qd. He takes a daily baby asa. He is prescribed nitro, he has not needed it. Plavix  stopped (> 57yr since stent)  Patient denies chest pain, shortness of breath, dizziness or lower extremity edema.  Diet: low sodium   Diabetes type 2: Patient reports compliance with  metformin 500 mg Monday Wednesday and Friday. Patient denies dizziness, hyperglycemic or hypoglycemic events. Patient denies numbness, tingling in the extremities or nonhealing wounds of feet.   iron deficiency anemia:  Iron UTD 01/2021  Past Medical History:  Diagnosis Date   Anemia    Arthritis    Cervical radiculopathy 12/22/2019   Colonic polyp    Concussion 03/05/2018   Coronary artery disease    stents x 3 (03-2014)   Diabetes mellitus    Diverticulosis     throughout entire colon   History of total right knee replacement 07/09/2018   HTN (hypertension)    Hypertriglyceridemia    Ingrown toenail 10/21/2019   Kidney stone    Left hamstring injury 11/02/2020   Lymphocytosis    Metatarsal fracture left   left 5th proximal phalanx    Neck pain 02/19/2018   OSA (obstructive sleep apnea)    "Mild" by records   PONV (postoperative nausea and vomiting)    first time, no problem since then   RBBB 01/2016   Right inguinal hernia 02/03/2016   ? 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/2011   Splenomegaly    STEMI (ST elevation myocardial infarction) [facility])    August 2015   Subarachnoid hematoma 03/12/2018   Subarachnoid hemorrhage ([place]) 03/05/2018   No Known Allergies Past Surgical History:  Procedure Laterality Date   CARDIAC CATHETERIZATION     stents x 3 (03/2014)   CHOLECYSTECTOMY     INGUINAL HERNIA REPAIR Right 05/27/2015   Procedure: LAPAROSCOPIC REPAIR RIGHT  INGUINAL HERNIA;  Surgeon: Greer Pickerel, MD;  Location: [facility];  Service: General;  Laterality: Right;   INSERTION OF MESH Right 05/27/2015   Procedure: INSERTION OF MESH;  Surgeon: Greer Pickerel, MD;  Location: [facility];  Service: General;  Laterality: Right;   LUMBAR DISC SURGERY     TONSILLECTOMY AND ADENOIDECTOMY     TOTAL  KNEE ARTHROPLASTY Right 07/09/2018   Procedure: RIGHT TOTAL KNEE ARTHROPLASTY;  Surgeon: Garald Balding, MD;  Location: [facility];  Service: Orthopedics;  Laterality: Right;   Family History  Problem Relation Age of Onset   Kidney disease Sister    Colon cancer Sister 53   Colon cancer Brother    Bone cancer Brother    Arthritis Mother    Breast cancer Mother    Heart disease Mother    Early death Father    Arthritis Son    Esophageal cancer Sister    Bladder Cancer Sister    Rectal cancer Neg Hx    Stomach cancer Neg Hx    Social History   Socioeconomic History   Marital status: Married    Spouse name: Not  on file   Number of children: 2   Years of education: Not on file   Highest education level: Not on file  Occupational History   Occupation: retired    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    HS education. Retired.    Former smoker. No drugs or ETOH.   Wears seatbelt.   Wears dentures.   Smoke detector in the home. Firearms  In the home.    Feels safe in his relationships.    Social Determinants of Health   Financial Resource Strain: Low Risk    Difficulty of Paying Living Expenses: Not hard at all  Food Insecurity: No Food Insecurity   Worried About Charity fundraiser in the Last Year: Never true   [place] in the Last Year: Never true  Transportation Needs: No Transportation Needs   Lack of Transportation (Medical): No   Lack of Transportation (Non-Medical): No  Physical Activity: Inactive   Days of Exercise per Week: 0 days   Minutes of Exercise per Session: 0 min  Stress: No Stress Concern Present   Feeling of Stress : Not at all  Social Connections: Moderately Isolated   Frequency of Communication with Friends and Family: More than three times a week   Frequency of Social Gatherings with Friends and Family: More than three times a week   Attends Religious Services: Never   Marine scientist or Organizations: No   Attends Music therapist: Never   Marital Status: Married  Human resources officer Violence: Not At Risk   Fear of Current or Ex-Partner: No   Emotionally Abused: No    Physically Abused: No   Sexually Abused: No   ROS: Negative, with the exception of above mentioned in HPI  Objective: BP 125/77   Pulse 64   Temp (!) 97.5 F (36.4 C) (Oral)   Ht 5\' 8"  (1.727 m)   Wt 175 lb (79.4 kg)   SpO2 99%   BMI 26.61 kg/m   Gen: Afebrile. No acute distress. Nontoxic pleasant male.  HENT: AT. [place]. MMM Eyes:Pupils  Equal Round Reactive to light, Extraocular movements intact,  Conjunctiva without redness, discharge or icterus. Neck/lymp/endocrine: Supple, no thyromegaly CV: RRR no murmur, no edema, +2/4 P posterior tibialis pulses Chest: CTAB, no wheeze or crackles Abd: Soft. NTND. BS present. .  Skin: no rashes, purpura or petechiae.  Neuro: Normal gait. PERLA. EOMi. Alert. Oriented x3 Psych: Normal affect, dress and demeanor. Normal speech. Normal thought content and judgment.   Assessment/plan: Marcus Taylor is a 80 y.o. male present for chronic medical condition follow up.  Iron deficiency anemia:  Follows with hematology  Controlled type 2 diabetes mellitus with complication, without long-term current use of insulin ([place]) - stable - continue metformin 500 mg Monday, Wednesday and Friday only.  A1c: 6.2 --> 6.0>5.7>> 5.9>5.8>6.0> 5.9> 5.3> 5.4 today  - losing weight and a1c very well controlled. Would not advise much more weight loss below 185-190 for him.  - Opthalmology: 11/22 [place] Dr. [name].  - pna series UTD/completed  - flu shot up-to-date 2022 - foot exam completed 06/2021 - urine micro on ACe   Essential hypertension/Coronary artery disease involving native coronary artery of native heart without angina pectoris/History of ST elevation myocardial infarction (STEMI) Stable.  - Follows yearly with cardiology.  - continue  lopressor 12.5 twice daily - continue  lisinopril 40 mg daily -  amlodipine 5 mg qd - continue statin 1/2 tab alternation every other day - Low sodium diet encouraged. - F/U q 5.5  months   Splenic Margicnal  Zone B-cell lymphoma/thrombocytopenia/anemia of chronic disease/pancytopenia/lymphocytosis: - continue to  follow up with heme/onc.   Orders Placed This Encounter  Procedures   Flu Vaccine QUAD High Dose(Fluad)   POCT HgB A1C    Meds ordered this encounter  Medications   amLODipine (NORVASC) 5 MG tablet    Sig: Take 1 tablet (5 mg total) by mouth daily.    Dispense:  90 tablet    Refill:  1   atorvastatin (LIPITOR) 40 MG tablet    Sig: Take 1 tablet (40 mg total) by mouth daily.    Dispense:  90 tablet    Refill:  3    DC prior scripts for lipitor   metoprolol tartrate (LOPRESSOR) 25 MG tablet    Sig: Take 0.5 tablets (12.5 mg total) by mouth 2 (two) times daily.    Dispense:  90 tablet    Refill:  1   lisinopril (ZESTRIL) 40 MG tablet    Sig: Take 1 tablet (40 mg total) by mouth daily.    Dispense:  90 tablet    Refill:  1    Referral Orders  No referral(s) requested today    Electronically signed by: Howard Pouch, [place]

## 2021-08-17 ENCOUNTER — Ambulatory Visit (INDEPENDENT_AMBULATORY_CARE_PROVIDER_SITE_OTHER): Payer: Medicare Other

## 2021-08-17 ENCOUNTER — Other Ambulatory Visit: Payer: Self-pay

## 2021-08-17 ENCOUNTER — Ambulatory Visit: Payer: Medicare Other

## 2021-08-17 VITALS — BP 110/68 | HR 60 | Temp 97.8°F | Wt 176.4 lb

## 2021-08-17 DIAGNOSIS — Z0000 Encounter for general adult medical examination without abnormal findings: Secondary | ICD-10-CM

## 2021-08-17 NOTE — Patient Instructions (Signed)
Marcus Taylor , Thank you for taking time to come for your Medicare Wellness Visit. I appreciate your ongoing commitment to your health goals. Please review the following plan we discussed and let me know if I can assist you in the future.   Screening recommendations/referrals: Colonoscopy: No longer required  Recommended yearly ophthalmology/optometry visit for glaucoma screening and checkup Recommended yearly dental visit for hygiene and checkup  Vaccinations: Influenza vaccine: Done 07/04/21 repeat every year  Pneumococcal vaccine: Up to date Tdap vaccine: Done 03/24/14  Shingles vaccine: Completed 05/30/18 & 09/17/18   Covid-19: Completed 2/21, 10/29/19  Advanced directives: Please bring a copy of your health care power of attorney and living will to the office at your convenience.   Conditions/risks identified: None at this time  Next appointment: Follow up in one year for your annual wellness visit.   Preventive Care 18 Years and Older, Male Preventive care refers to lifestyle choices and visits with your health care provider that can promote health and wellness. What does preventive care include? A yearly physical exam. This is also called an annual well check. Dental exams once or twice a year. Routine eye exams. Ask your health care provider how often you should have your eyes checked. Personal lifestyle choices, including: Daily care of your teeth and gums. Regular physical activity. Eating a healthy diet. Avoiding tobacco and drug use. Limiting alcohol use. Practicing safe sex. Taking low doses of aspirin every day. Taking vitamin and mineral supplements as recommended by your health care provider. What happens during an annual well check? The services and screenings done by your health care provider during your annual well check will depend on your age, overall health, lifestyle risk factors, and family history of disease. Counseling  Your health care provider may ask  you questions about your: Alcohol use. Tobacco use. Drug use. Emotional well-being. Home and relationship well-being. Sexual activity. Eating habits. History of falls. Memory and ability to understand (cognition). Work and work Statistician. Screening  You may have the following tests or measurements: Height, weight, and BMI. Blood pressure. Lipid and cholesterol levels. These may be checked every 5 years, or more frequently if you are over 55 years old. Skin check. Lung cancer screening. You may have this screening every year starting at age 59 if you have a 30-pack-year history of smoking and currently smoke or have quit within the past 15 years. Fecal occult blood test (FOBT) of the stool. You may have this test every year starting at age 35. Flexible sigmoidoscopy or colonoscopy. You may have a sigmoidoscopy every 5 years or a colonoscopy every 10 years starting at age 22. Prostate cancer screening. Recommendations will vary depending on your family history and other risks. Hepatitis C blood test. Hepatitis B blood test. Sexually transmitted disease (STD) testing. Diabetes screening. This is done by checking your blood sugar (glucose) after you have not eaten for a while (fasting). You may have this done every 1-3 years. Abdominal aortic aneurysm (AAA) screening. You may need this if you are a current or former smoker. Osteoporosis. You may be screened starting at age 20 if you are at high risk. Talk with your health care provider about your test results, treatment options, and if necessary, the need for more tests. Vaccines  Your health care provider may recommend certain vaccines, such as: Influenza vaccine. This is recommended every year. Tetanus, diphtheria, and acellular pertussis (Tdap, Td) vaccine. You may need a Td booster every 10 years. Zoster vaccine. You may  need this after age 34. Pneumococcal 13-valent conjugate (PCV13) vaccine. One dose is recommended after age  58. Pneumococcal polysaccharide (PPSV23) vaccine. One dose is recommended after age 64. Talk to your health care provider about which screenings and vaccines you need and how often you need them. This information is not intended to replace advice given to you by your health care provider. Make sure you discuss any questions you have with your health care provider. Document Released: 08/27/2015 Document Revised: 04/19/2016 Document Reviewed: 06/01/2015 Elsevier Interactive Patient Education  2017 [place] Prevention in the Home Falls can cause injuries. They can happen to people of all ages. There are many things you can do to make your home safe and to help prevent falls. What can I do on the outside of my home? Regularly fix the edges of walkways and driveways and fix any cracks. Remove anything that might make you trip as you walk through a door, such as a raised step or threshold. Trim any bushes or trees on the path to your home. Use bright outdoor lighting. Clear any walking paths of anything that might make someone trip, such as rocks or tools. Regularly check to see if handrails are loose or broken. Make sure that both sides of any steps have handrails. Any raised decks and porches should have guardrails on the edges. Have any leaves, snow, or ice cleared regularly. Use sand or salt on walking paths during winter. Clean up any spills in your garage right away. This includes oil or grease spills. What can I do in the bathroom? Use night lights. Install grab bars by the toilet and in the tub and shower. Do not use towel bars as grab bars. Use non-skid mats or decals in the tub or shower. If you need to sit down in the shower, use a plastic, non-slip stool. Keep the floor dry. Clean up any water that spills on the floor as soon as it happens. Remove soap buildup in the tub or shower regularly. Attach bath mats securely with double-sided non-slip rug tape. Do not have throw  rugs and other things on the floor that can make you trip. What can I do in the bedroom? Use night lights. Make sure that you have a light by your bed that is easy to reach. Do not use any sheets or blankets that are too big for your bed. They should not hang down onto the floor. Have a firm chair that has side arms. You can use this for support while you get dressed. Do not have throw rugs and other things on the floor that can make you trip. What can I do in the kitchen? Clean up any spills right away. Avoid walking on wet floors. Keep items that you use a lot in easy-to-reach places. If you need to reach something above you, use a strong step stool that has a grab bar. Keep electrical cords out of the way. Do not use floor polish or wax that makes floors slippery. If you must use wax, use non-skid floor wax. Do not have throw rugs and other things on the floor that can make you trip. What can I do with my stairs? Do not leave any items on the stairs. Make sure that there are handrails on both sides of the stairs and use them. Fix handrails that are broken or loose. Make sure that handrails are as long as the stairways. Check any carpeting to make sure that it is firmly attached  to the stairs. Fix any carpet that is loose or worn. Avoid having throw rugs at the top or bottom of the stairs. If you do have throw rugs, attach them to the floor with carpet tape. Make sure that you have a light switch at the top of the stairs and the bottom of the stairs. If you do not have them, ask someone to add them for you. What else can I do to help prevent falls? Wear shoes that: Do not have high heels. Have rubber bottoms. Are comfortable and fit you well. Are closed at the toe. Do not wear sandals. If you use a stepladder: Make sure that it is fully opened. Do not climb a closed stepladder. Make sure that both sides of the stepladder are locked into place. Ask someone to hold it for you, if  possible. Clearly mark and make sure that you can see: Any grab bars or handrails. First and last steps. Where the edge of each step is. Use tools that help you move around (mobility aids) if they are needed. These include: Canes. Walkers. Scooters. Crutches. Turn on the lights when you go into a dark area. Replace any light bulbs as soon as they burn out. Set up your furniture so you have a clear path. Avoid moving your furniture around. If any of your floors are uneven, fix them. If there are any pets around you, be aware of where they are. Review your medicines with your doctor. Some medicines can make you feel dizzy. This can increase your chance of falling. Ask your doctor what other things that you can do to help prevent falls. This information is not intended to replace advice given to you by your health care provider. Make sure you discuss any questions you have with your health care provider. Document Released: 05/27/2009 Document Revised: 01/06/2016 Document Reviewed: 09/04/2014 Elsevier Interactive Patient Education  2017 [employer].

## 2021-08-17 NOTE — Progress Notes (Addendum)
Subjective:   Marcus Taylor is a 81 y.o. male who presents for Medicare Annual/Subsequent preventive examination.  Review of Systems     Cardiac Risk Factors include: advanced age (>44men, >83 women);diabetes mellitus;hypertension;male gender     Objective:    Today's Vitals   08/17/21 0925  BP: 110/68  Pulse: 60  Temp: 97.8 F (36.6 C)  SpO2: 97%  Weight: 176 lb 6.4 oz (80 kg)   Body mass index is 26.82 kg/m.  Advanced Directives 08/17/2021 08/11/2020 01/09/2020 01/02/2020 09/11/2019 07/09/2018 06/14/2018  Does Patient Have a Medical Advance Directive? Yes Yes Yes Yes Yes No No  Type of Paramedic of [place];Living will [place];Living will [place];Living will [place];Living will - -  Does patient want to make changes to medical advance directive? - - - - - - -  Copy of [place] in Chart? No - copy requested No - copy requested No - copy requested No - copy requested No - copy requested - -  Would patient like information on creating a medical advance directive? - - - - - No - Patient declined No - Patient declined    Current Medications (verified) Outpatient Encounter Medications as of 08/17/2021  Medication Sig   amLODipine (NORVASC) 5 MG tablet Take 1 tablet (5 mg total) by mouth daily.   aspirin EC 81 MG tablet Take 81 mg by mouth daily.   atorvastatin (LIPITOR) 40 MG tablet Take 1 tablet (40 mg total) by mouth daily.   diclofenac Sodium (VOLTAREN) 1 % GEL APPLY 2 GRAMS TO AFFECTED AREA 4 TIMES A DAY   esomeprazole (NEXIUM) 20 MG capsule Take 20 mg by mouth every morning.   ferrous sulfate 324 MG TBEC Take 324 mg by mouth daily.   lisinopril (ZESTRIL) 40 MG tablet Take 1 tablet (40 mg total) by mouth daily.   metFORMIN (GLUCOPHAGE-XR) 500 MG 24 hr tablet TAKE 1 TABLET BY MOUTH EVERY MONDAY, WEDNESDAY,AND FRIDAY WITH EVENING MEAL    metoprolol tartrate (LOPRESSOR) 25 MG tablet Take 0.5 tablets (12.5 mg total) by mouth 2 (two) times daily.   nitroGLYCERIN (NITROSTAT) 0.4 MG SL tablet Place 1 tablet (0.4 mg total) under the tongue every 5 (five) minutes as needed for chest pain.   Facility-Administered Encounter Medications as of 08/17/2021  Medication   0.9 %  sodium chloride infusion    Allergies (verified) Patient has no known allergies.   History: Past Medical History:  Diagnosis Date   Anemia    Arthritis    Cervical radiculopathy 12/22/2019   Colonic polyp    Concussion 03/05/2018   Coronary artery disease    stents x 3 (03-2014)   Diabetes mellitus    Diverticulosis    throughout entire colon   History of total right knee replacement 07/09/2018   HTN (hypertension)    Hypertriglyceridemia    Ingrown toenail 10/21/2019   Kidney stone    Left hamstring injury 11/02/2020   Lymphocytosis    Metatarsal fracture left   left 5th proximal phalanx    Neck pain 02/19/2018   OSA (obstructive sleep apnea)    "Mild" by records   PONV (postoperative nausea and vomiting)    first time, no problem since then   RBBB 01/2016   Right inguinal hernia 02/03/2016   ? 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   Splenomegaly  STEMI (ST elevation myocardial infarction) [facility])    August 2015   Subarachnoid hematoma 03/12/2018   Subarachnoid hemorrhage ([place]) 03/05/2018   Past Surgical History:  Procedure Laterality Date   CARDIAC CATHETERIZATION     stents x 3 (03/2014)   CHOLECYSTECTOMY     INGUINAL HERNIA REPAIR Right 05/27/2015   Procedure: LAPAROSCOPIC REPAIR RIGHT  INGUINAL HERNIA;  Surgeon: Greer Pickerel, MD;  Location: [facility];  Service: General;  Laterality: Right;   INSERTION OF MESH Right 05/27/2015   Procedure: INSERTION OF MESH;  Surgeon: Greer Pickerel, MD;  Location: [facility];  Service: General;  Laterality: Right;   LUMBAR DISC SURGERY     TONSILLECTOMY AND ADENOIDECTOMY      TOTAL KNEE ARTHROPLASTY Right 07/09/2018   Procedure: RIGHT TOTAL KNEE ARTHROPLASTY;  Surgeon: Garald Balding, MD;  Location: [facility];  Service: Orthopedics;  Laterality: Right;   Family History  Problem Relation Age of Onset   Kidney disease Sister    Colon cancer Sister 78   Colon cancer Brother    Bone cancer Brother    Arthritis Mother    Breast cancer Mother    Heart disease Mother    Early death Father    Arthritis Son    Esophageal cancer Sister    Bladder Cancer Sister    Rectal cancer Neg Hx    Stomach cancer Neg Hx    Social History   Socioeconomic History   Marital status: Married    Spouse name: Not on file   Number of children: 2   Years of education: Not on file   Highest education level: Not on file  Occupational History   Occupation: retired    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    HS education. Retired.    Former smoker. No drugs or ETOH.   Wears seatbelt.   Wears dentures.   Smoke detector in the home. Firearms  In the home.    Feels safe in his relationships.    Social Determinants of Health   Financial Resource Strain: Low Risk    Difficulty of Paying Living Expenses: Not hard at all  Food Insecurity: No Food Insecurity   Worried About Charity fundraiser in the Last Year: Never true   [place] in the Last Year: Never true  Transportation Needs: No Transportation Needs   Lack of Transportation (Medical): No   Lack of Transportation (Non-Medical): No  Physical Activity:  Inactive   Days of Exercise per Week: 0 days   Minutes of Exercise per Session: 0 min  Stress: No Stress Concern Present   Feeling of Stress : Not at all  Social Connections: Moderately Integrated   Frequency of Communication with Friends and Family: More than three times a week   Frequency of Social Gatherings with Friends and Family: More than three times a week  Attends Religious Services: More than 4 times per year   Active Member of Clubs or Organizations: No   Attends Archivist Meetings: Never   Marital Status: Married    Tobacco Counseling Counseling given: Not Answered   Clinical Intake:  Pre-visit preparation completed: Yes  Pain : No/denies pain     BMI - recorded: 26.82 Nutritional Status: BMI 25 -29 Overweight Nutritional Risks: Other (Comment) Diabetes: No  How often do you need to have someone help you when you read instructions, pamphlets, or other written materials from your doctor or pharmacy?: 1 - Never  Diabetic?Nutrition Risk Assessment:  Has the patient had any N/V/D within the last 2 months?  No  Does the patient have any non-healing wounds?  No  Has the patient had any unintentional weight loss or weight gain?  No   Diabetes:  Is the patient diabetic?  Yes  If diabetic, was a CBG obtained today?  No  Did the patient bring in their glucometer from home?  No  How often do you monitor your CBG's? N/a.   Financial Strains and Diabetes Management:  Are you having any financial strains with the device, your supplies or your medication? No .  Does the patient want to be seen by Chronic Care Management for management of their diabetes?  No  Would the patient like to be referred to a Nutritionist or for Diabetic Management?  No   Diabetic Exams:  Diabetic Eye Exam: Completed 10/26/20 Diabetic Foot Exam: Completed 07/04/21   Interpreter Needed?: No  Information entered by :: Charlott Rakes, LPN   Activities of Daily Living In  your present state of health, do you have any difficulty performing the following activities: 08/17/2021  Hearing? Y  Comment slight loss  Vision? N  Difficulty concentrating or making decisions? N  Walking or climbing stairs? N  Dressing or bathing? N  Doing errands, shopping? N  Preparing Food and eating ? N  Using the Toilet? N  In the past six months, have you accidently leaked urine? N  Do you have problems with loss of bowel control? N  Managing your Medications? N  Managing your Finances? N  Housekeeping or managing your Housekeeping? N  Some recent data might be hidden    Patient Care Team: [name], DO as PCP - General (Family Medicine) [name], MD (Inactive) as Consulting Physician (Gastroenterology) [name], MD as Consulting Physician (Hematology and Oncology) [name], MD as Referring Physician (Cardiology) [name], MD as Consulting Physician (Neurosurgery) [name], OD as Referring Physician Garald Balding, MD as Consulting Physician (Orthopedic Surgery) Trula Slade, DPM as Consulting Physician (Podiatry)  Indicate any recent Medical Services you may have received from other than [facility] providers in the past year (date may be approximate).     Assessment:   This is a routine wellness examination for Marcus Taylor.  Hearing/Vision screen Hearing Screening - Comments:: Pt has slight hearing issues  Vision Screening - Comments:: Pt follows up with [facility] for annual  eye exams   Dietary issues and exercise activities discussed: Current Exercise Habits: The patient does not participate in regular exercise at present   Goals Addressed             This Visit's Progress    Patient Stated       None at this time       Depression Screen [facility] 2/9 Scores 08/17/2021 07/04/2021 12/21/2020 11/02/2020 08/11/2020 06/04/2020 06/05/2019  PHQ - 2 Score 0 0 0 0 0 0 0    Fall Risk Fall Risk  08/17/2021 07/04/2021 12/21/2020  11/02/2020 08/11/2020  Falls in the past year? 0 0 0 0 0  Comment - - - - -  Number falls in past yr: 0 0 0 0 0  Injury with Fall? 0 0 0 0 0  Risk Factor Category  - - - - -  Risk for fall due to : Impaired vision No Fall Risks No Fall Risks - -  Follow up Falls prevention discussed Falls evaluation completed - - Falls prevention discussed    FALL RISK PREVENTION PERTAINING TO THE HOME:  Any stairs in or around the home? No  If so, are there any without handrails? No  Home free of loose throw rugs in walkways, pet beds, electrical cords, etc? Yes  Adequate lighting in your home to reduce risk of falls? Yes   ASSISTIVE DEVICES UTILIZED TO PREVENT FALLS:  Life alert? No  Use of a cane, walker or w/c? No  Grab bars in the bathroom? Yes  Shower chair or bench in shower? No  Elevated toilet seat or a handicapped toilet? No   TIMED UP AND GO:  Was the test performed? Yes .  Length of time to ambulate 10 feet: 15 sec.   Gait steady and fast without use of assistive device  Cognitive Function: MMSE - Mini Mental State Exam 06/14/2018  Orientation to time 5  Orientation to Place 5  Registration 3  Attention/ Calculation 5  Recall 0  Language- name 2 objects 2  Language- repeat 1  Language- follow 3 step command 3  Language- read & follow direction 1  Write a sentence 1  Copy design 1  Total score 27     6CIT Screen 08/17/2021  What Year? 0 points  What month? 0 points  Count back from 20 0 points  Months in reverse 0 points  Repeat phrase 6 points    Immunizations Immunization History  Administered Date(s) Administered   Fluad Quad(high Dose 65+) 04/28/2019, 05/10/2020, 07/04/2021   Influenza Split 05/07/2012   Influenza, High Dose Seasonal PF 05/03/2016, 07/02/2017, 05/20/2018   Influenza,inj,Quad PF,6+ Mos 09/22/2013, 05/25/2014, 05/31/2015   PFIZER(Purple Top)SARS-COV-2 Vaccination 10/05/2019, 10/29/2019   Pneumococcal Conjugate-13 09/28/2014   Pneumococcal  Polysaccharide-23 10/05/2011   Tdap 03/24/2014   Zoster Recombinat (Shingrix) 05/30/2018, 09/17/2018    TDAP status: Up to date  Flu Vaccine status: Up to date  Pneumococcal vaccine status: Up to date  Covid-19 vaccine status: Completed vaccines  Qualifies for Shingles Vaccine? Yes   Zostavax completed Yes   Shingrix Completed?: Yes  Screening Tests Health Maintenance  Topic Date Due   COVID-19 Vaccine (3 - Pfizer risk series) 11/26/2019   OPHTHALMOLOGY EXAM  10/26/2021   HEMOGLOBIN A1C  01/01/2022   FOOT EXAM  07/04/2022   TETANUS/TDAP  03/24/2024   Pneumonia Vaccine 68+ Years old  Completed   INFLUENZA VACCINE  Completed   Zoster Vaccines- Shingrix  Completed   HPV VACCINES  Aged Out   COLONOSCOPY (Pts 45-69yrs Insurance coverage will need to be confirmed)  Discontinued    Health Maintenance  Health Maintenance Due  Topic Date Due   COVID-19 Vaccine (3 - Pfizer risk series) 11/26/2019    Colorectal cancer screening: No longer required.    Additional Screening:   Vision Screening: Recommended annual ophthalmology exams for early detection of glaucoma and other disorders of the eye. Is the patient up  to date with their annual eye exam?  Yes  Who is the provider or what is the name of the office in which the patient attends annual eye exams? [facility]  If pt is not established with a provider, would they like to be referred to a provider to establish care? No .   Dental Screening: Recommended annual dental exams for proper oral hygiene  Community Resource Referral / Chronic Care Management: CRR required this visit?  No   CCM required this visit?  No      Plan:     I have personally reviewed and noted the following in the patients chart:   Medical and social history Use of alcohol, tobacco or illicit drugs  Current medications and supplements including opioid prescriptions. Patient is not currently taking opioid prescriptions. Functional ability and  status Nutritional status Physical activity Advanced directives List of other physicians Hospitalizations, surgeries, and ER visits in previous 12 months Vitals Screenings to include cognitive, depression, and falls Referrals and appointments  In addition, I have reviewed and discussed with patient certain preventive protocols, quality metrics, and best practice recommendations. A written personalized care plan for preventive services as well as general preventive health recommendations were provided to patient.     Willette Brace, LPN   08/19/1094   Nurse Notes: None

## 2021-09-09 ENCOUNTER — Other Ambulatory Visit: Payer: Self-pay

## 2021-09-09 DIAGNOSIS — D500 Iron deficiency anemia secondary to blood loss (chronic): Secondary | ICD-10-CM

## 2021-09-09 DIAGNOSIS — D539 Nutritional anemia, unspecified: Secondary | ICD-10-CM

## 2021-09-12 ENCOUNTER — Inpatient Hospital Stay: Payer: Medicare Other | Admitting: Hematology and Oncology

## 2021-09-12 ENCOUNTER — Encounter: Payer: Self-pay | Admitting: Hematology and Oncology

## 2021-09-12 ENCOUNTER — Inpatient Hospital Stay: Payer: Medicare Other | Attending: Hematology and Oncology

## 2021-09-12 ENCOUNTER — Telehealth: Payer: Self-pay

## 2021-09-12 ENCOUNTER — Other Ambulatory Visit: Payer: Self-pay

## 2021-09-12 DIAGNOSIS — D696 Thrombocytopenia, unspecified: Secondary | ICD-10-CM | POA: Diagnosis not present

## 2021-09-12 DIAGNOSIS — C8300 Small cell B-cell lymphoma, unspecified site: Secondary | ICD-10-CM | POA: Insufficient documentation

## 2021-09-12 DIAGNOSIS — C8307 Small cell B-cell lymphoma, spleen: Secondary | ICD-10-CM

## 2021-09-12 DIAGNOSIS — Z79899 Other long term (current) drug therapy: Secondary | ICD-10-CM | POA: Diagnosis not present

## 2021-09-12 DIAGNOSIS — D500 Iron deficiency anemia secondary to blood loss (chronic): Secondary | ICD-10-CM | POA: Diagnosis not present

## 2021-09-12 DIAGNOSIS — D509 Iron deficiency anemia, unspecified: Secondary | ICD-10-CM | POA: Diagnosis not present

## 2021-09-12 DIAGNOSIS — D539 Nutritional anemia, unspecified: Secondary | ICD-10-CM

## 2021-09-12 LAB — CBC WITH DIFFERENTIAL/PLATELET
Abs Immature Granulocytes: 0.07 10*3/uL (ref 0.00–0.07)
Basophils Absolute: 0.1 10*3/uL (ref 0.0–0.1)
Basophils Relative: 0 %
Eosinophils Absolute: 0.1 10*3/uL (ref 0.0–0.5)
Eosinophils Relative: 1 %
HCT: 36.3 % — ABNORMAL LOW (ref 39.0–52.0)
Hemoglobin: 11.6 g/dL — ABNORMAL LOW (ref 13.0–17.0)
Immature Granulocytes: 0 %
Lymphocytes Relative: 86 %
Lymphs Abs: 25.2 10*3/uL — ABNORMAL HIGH (ref 0.7–4.0)
MCH: 28 pg (ref 26.0–34.0)
MCHC: 32 g/dL (ref 30.0–36.0)
MCV: 87.7 fL (ref 80.0–100.0)
Monocytes Absolute: 0.7 10*3/uL (ref 0.1–1.0)
Monocytes Relative: 2 %
Neutro Abs: 3.2 10*3/uL (ref 1.7–7.7)
Neutrophils Relative %: 11 %
Platelets: 127 10*3/uL — ABNORMAL LOW (ref 150–400)
RBC: 4.14 MIL/uL — ABNORMAL LOW (ref 4.22–5.81)
RDW: 16.8 % — ABNORMAL HIGH (ref 11.5–15.5)
Smear Review: NORMAL
WBC: 29.3 10*3/uL — ABNORMAL HIGH (ref 4.0–10.5)
nRBC: 0 % (ref 0.0–0.2)

## 2021-09-12 LAB — COMPREHENSIVE METABOLIC PANEL
ALT: 14 U/L (ref 0–44)
AST: 29 U/L (ref 15–41)
Albumin: 4.2 g/dL (ref 3.5–5.0)
Alkaline Phosphatase: 63 U/L (ref 38–126)
Anion gap: 5 (ref 5–15)
BUN: 18 mg/dL (ref 8–23)
CO2: 30 mmol/L (ref 22–32)
Calcium: 9 mg/dL (ref 8.9–10.3)
Chloride: 107 mmol/L (ref 98–111)
Creatinine, Ser: 0.71 mg/dL (ref 0.61–1.24)
GFR, Estimated: >60 mL/min (ref >60)
Glucose, Bld: 105 mg/dL — ABNORMAL HIGH (ref 70–99)
Potassium: 3.7 mmol/L (ref 3.5–5.1)
Sodium: 142 mmol/L (ref 135–145)
Total Bilirubin: 0.5 mg/dL (ref 0.3–1.2)
Total Protein: 6.2 g/dL — ABNORMAL LOW (ref 6.5–8.1)

## 2021-09-12 LAB — IRON AND IRON BINDING CAPACITY (CC-WL,HP ONLY)
Iron: 38 ug/dL — ABNORMAL LOW (ref 45–182)
Saturation Ratios: 14 % — ABNORMAL LOW (ref 17.9–39.5)
TIBC: 263 ug/dL (ref 250–450)
UIBC: 225 ug/dL (ref 117–376)

## 2021-09-12 LAB — FERRITIN: Ferritin: 76 ng/mL (ref 24–336)

## 2021-09-12 LAB — VITAMIN B12: Vitamin B-12: 207 pg/mL (ref 180–914)

## 2021-09-12 NOTE — Telephone Encounter (Signed)
Called and given below message. He verbalized understanding. 

## 2021-09-12 NOTE — Assessment & Plan Note (Signed)
He has fluctuation of his white blood cell count and lymphocyte count but overall asymptomatic He had very mild thrombocytopenia but not symptomatic For now, I recommend close observation The patient is educated to watch for signs and symptoms of cancer recurrence I will see him again in 6 months for further follow-up

## 2021-09-12 NOTE — Assessment & Plan Note (Signed)
He has signs of iron deficiency I recommend oral iron supplement daily I plan to recheck iron studies again in 6 months

## 2021-09-12 NOTE — Telephone Encounter (Signed)
-----   Message from [name], MD sent at 09/12/2021 10:41 AM EST ----- Pls call him and let him know iron studies are a bit low Continue taking one OTC iron supplement

## 2021-09-12 NOTE — Assessment & Plan Note (Signed)
He has mild intermittent thrombocytopenia but not symptomatic Observe closely There is no contraindication for him to take antiplatelet agents

## 2021-09-12 NOTE — Progress Notes (Signed)
[facility] OFFICE PROGRESS NOTE  Patient Care Team: [name], DO as PCP - General (Family Medicine) [name], MD (Inactive) as Consulting Physician (Gastroenterology) [name], MD as Consulting Physician (Hematology and Oncology) [name], MD as Referring Physician (Cardiology) [name], MD as Consulting Physician (Neurosurgery) [name], OD as Referring Physician Garald Balding, MD as Consulting Physician (Orthopedic Surgery) Trula Slade, DPM as Consulting Physician (Podiatry)  ASSESSMENT & PLAN:  Splenic marginal zone b-cell lymphoma [facility]) He has fluctuation of his white blood cell count and lymphocyte count but overall asymptomatic He had very mild thrombocytopenia but not symptomatic For now, I recommend close observation The patient is educated to watch for signs and symptoms of cancer recurrence I will see him again in 6 months for further follow-up  IDA (iron deficiency anemia) He has signs of iron deficiency I recommend oral iron supplement daily I plan to recheck iron studies again in 6 months  Thrombocytopenia ([place]) He has mild intermittent thrombocytopenia but not symptomatic Observe closely There is no contraindication for him to take antiplatelet agents  Orders Placed This Encounter  Procedures   CBC with Differential ([facility] Only)    Standing Status:   Future    Standing Expiration Date:   09/12/2022   CMP ([facility] only)    Standing Status:   Future    Standing Expiration Date:   09/12/2022   Iron and Iron Binding Capacity ([facility] only)    Standing Status:   Future    Standing Expiration Date:   09/12/2022   Ferritin    Standing Status:   Future    Standing Expiration Date:   09/12/2022    All questions were answered. The patient knows to call the clinic with any problems, questions or concerns. The total time spent in the appointment was 20 minutes encounter with patients including  review of chart and various tests results, discussions about plan of care and coordination of care plan   [name], MD 09/12/2021 12:00 PM  INTERVAL HISTORY: Please see below for problem oriented charting. he returns for treatment follow-up for splenic marginal zone lymphoma and iron deficiency anemia He is doing well No new lymphadenopathy Denies recent infection No recent weight loss  REVIEW OF SYSTEMS:   Constitutional: Denies fevers, chills or abnormal weight loss Eyes: Denies blurriness of vision Ears, nose, mouth, throat, and face: Denies mucositis or sore throat Respiratory: Denies cough, dyspnea or wheezes Cardiovascular: Denies palpitation, chest discomfort or lower extremity swelling Gastrointestinal:  Denies nausea, heartburn or change in bowel habits Skin: Denies abnormal skin rashes Lymphatics: Denies new lymphadenopathy or easy bruising Neurological:Denies numbness, tingling or new weaknesses Behavioral/Psych: Mood is stable, no new changes  All other systems were reviewed with the patient and are negative.  I have reviewed the past medical history, past surgical history, social history and family history with the patient and they are unchanged from previous note.  ALLERGIES:  has No Known Allergies.  MEDICATIONS:  Current Outpatient Medications  Medication Sig Dispense Refill   amLODipine (NORVASC) 5 MG tablet Take 1 tablet (5 mg total) by mouth daily. 90 tablet 1   aspirin EC 81 MG tablet Take 81 mg by mouth daily.     atorvastatin (LIPITOR) 40 MG tablet Take 1 tablet (40 mg total) by mouth daily. 90 tablet 3   diclofenac Sodium (VOLTAREN) 1 % GEL APPLY 2 GRAMS TO AFFECTED AREA 4 TIMES A DAY 600 g 1  esomeprazole (NEXIUM) 20 MG capsule Take 20 mg by mouth every morning.     ferrous sulfate 324 MG TBEC Take 324 mg by mouth daily.     lisinopril (ZESTRIL) 40 MG tablet Take 1 tablet (40 mg total) by mouth daily. 90 tablet 1   metFORMIN (GLUCOPHAGE-XR) 500 MG 24  hr tablet TAKE 1 TABLET BY MOUTH EVERY MONDAY, WEDNESDAY,AND FRIDAY WITH EVENING MEAL 40 tablet 3   metoprolol tartrate (LOPRESSOR) 25 MG tablet Take 0.5 tablets (12.5 mg total) by mouth 2 (two) times daily. 90 tablet 1   nitroGLYCERIN (NITROSTAT) 0.4 MG SL tablet Place 1 tablet (0.4 mg total) under the tongue every 5 (five) minutes as needed for chest pain. 10 tablet 0   Current Facility-Administered Medications  Medication Dose Route Frequency Provider Last Rate Last Admin   0.9 %  sodium chloride infusion  500 mL Intravenous Once Armbruster, Carlota Raspberry, MD        SUMMARY OF ONCOLOGIC HISTORY: Oncology History  Splenic marginal zone b-cell lymphoma ([place])  12/18/2011 Initial Diagnosis   Splenic marginal zone b-cell lymphoma ([place])   11/08/2020 Cancer Staging   Staging form: Hodgkin and Non-Hodgkin Lymphoma, AJCC 7th Edition - Clinical: S - Spleen - Signed by [name], MD on 11/08/2020 Staged by: Managing physician Stage prefix: Initial diagnosis      PHYSICAL EXAMINATION: ECOG PERFORMANCE STATUS: 1 - Symptomatic but completely ambulatory  Vitals:   09/12/21 0900  BP: 140/62  Pulse: (!) 59  Resp: 18  Temp: (!) 97.5 F (36.4 C)  SpO2: 98%   Filed Weights   09/12/21 0900  Weight: 175 lb 6.4 oz (79.6 kg)    GENERAL:alert, no distress and comfortable SKIN: skin color, texture, turgor are normal, no rashes or significant lesions EYES: normal, Conjunctiva are pink and non-injected, sclera clear OROPHARYNX:no exudate, no erythema and lips, buccal mucosa, and tongue normal  NECK: supple, thyroid normal size, non-tender, without nodularity LYMPH:  no palpable lymphadenopathy in the cervical, axillary or inguinal LUNGS: clear to auscultation and percussion with normal breathing effort HEART: regular rate & rhythm and no murmurs and no lower extremity edema ABDOMEN:abdomen soft, non-tender and normal bowel sounds Musculoskeletal:no cyanosis of digits and no clubbing  NEURO: alert  & oriented x 3 with fluent speech, no focal motor/sensory deficits  LABORATORY DATA:  I have reviewed the data as listed    Component Value Date/Time   NA 142 09/12/2021 0842   NA 144 05/31/2015 1404   K 3.7 09/12/2021 0842   K 4.7 05/31/2015 1404   CL 107 09/12/2021 0842   CL 102 10/07/2012 1013   CO2 30 09/12/2021 0842   CO2 29 05/31/2015 1404   GLUCOSE 105 (H) 09/12/2021 0842   GLUCOSE 99 05/31/2015 1404   GLUCOSE 199 (H) 10/07/2012 1013   BUN 18 09/12/2021 0842   BUN 18.3 05/31/2015 1404   CREATININE 0.71 09/12/2021 0842   CREATININE 0.86 03/13/2017 0759   CREATININE 0.9 05/31/2015 1404   CALCIUM 9.0 09/12/2021 0842   CALCIUM 9.2 05/31/2015 1404   PROT 6.2 (L) 09/12/2021 0842   PROT 6.3 (L) 05/31/2015 1404   ALBUMIN 4.2 09/12/2021 0842   ALBUMIN 3.9 05/31/2015 1404   AST 29 09/12/2021 0842   AST 28 05/31/2015 1404   ALT 14 09/12/2021 0842   ALT 23 05/31/2015 1404   ALKPHOS 63 09/12/2021 0842   ALKPHOS 79 05/31/2015 1404   BILITOT 0.5 09/12/2021 0842   BILITOT 0.46 05/31/2015 1404   GFRNONAA >  60 09/12/2021 0842   GFRNONAA 85 03/13/2017 0759   GFRAA >60 05/03/2020 0857   GFRAA >89 03/13/2017 0759    No results found for: SPEP, UPEP  Lab Results  Component Value Date   WBC 29.3 (H) 09/12/2021   NEUTROABS 3.2 09/12/2021   HGB 11.6 (L) 09/12/2021   HCT 36.3 (L) 09/12/2021   MCV 87.7 09/12/2021   PLT 127 (L) 09/12/2021      Chemistry      Component Value Date/Time   NA 142 09/12/2021 0842   NA 144 05/31/2015 1404   K 3.7 09/12/2021 0842   K 4.7 05/31/2015 1404   CL 107 09/12/2021 0842   CL 102 10/07/2012 1013   CO2 30 09/12/2021 0842   CO2 29 05/31/2015 1404   BUN 18 09/12/2021 0842   BUN 18.3 05/31/2015 1404   CREATININE 0.71 09/12/2021 0842   CREATININE 0.86 03/13/2017 0759   CREATININE 0.9 05/31/2015 1404      Component Value Date/Time   CALCIUM 9.0 09/12/2021 0842   CALCIUM 9.2 05/31/2015 1404   ALKPHOS 63 09/12/2021 0842   ALKPHOS 79  05/31/2015 1404   AST 29 09/12/2021 0842   AST 28 05/31/2015 1404   ALT 14 09/12/2021 0842   ALT 23 05/31/2015 1404   BILITOT 0.5 09/12/2021 0842   BILITOT 0.46 05/31/2015 1404

## 2021-09-30 ENCOUNTER — Ambulatory Visit: Payer: Medicare Other | Admitting: Podiatry

## 2021-09-30 ENCOUNTER — Other Ambulatory Visit: Payer: Self-pay

## 2021-09-30 DIAGNOSIS — L84 Corns and callosities: Secondary | ICD-10-CM | POA: Diagnosis not present

## 2021-09-30 DIAGNOSIS — M79676 Pain in unspecified toe(s): Secondary | ICD-10-CM | POA: Diagnosis not present

## 2021-09-30 DIAGNOSIS — D689 Coagulation defect, unspecified: Secondary | ICD-10-CM | POA: Diagnosis not present

## 2021-09-30 DIAGNOSIS — B351 Tinea unguium: Secondary | ICD-10-CM

## 2021-10-01 NOTE — Progress Notes (Signed)
Subjective: 81 y.o. returns the office today for painful, elongated, thickened toenails which he cannot trim himself. Denies any redness or drainage around the nails.  He has been using a toe separator between his big toe and second toe on the right foot which is been a "life saver". The toe no longer hurts when he wears it. No other concerns today.    PCP: Howard Pouch A, DO Last seen 07/04/2021 Last A1c he reports was 5.4 on  07/04/2021  Objective: AAO 3, NAD DP/PT pulses palpable, CRT less than 3 seconds  Nails hypertrophic, dystrophic, elongated, brittle, discolored 10. There is tenderness overlying the nails 1-5 bilaterally. There is no surrounding erythema or drainage along the nail sites. Minimal hyperkeratotic tissue in the medial second toe DIPJ as well as lateral hallux IPJ, there is a bunion putting pressure.  There is no underlying ulceration drainage or signs of infection. No pain with calf compression, swelling, warmth, erythema.  Assessment: Patient presents with symptomatic onychomycosis  Plan: -Treatment options including alternatives, risks, complications were discussed -Nails sharply debrided 10 without complication/bleeding. -Sharply debrided the mild hyperkeratotic tissue with any complications or bleeding today x 1.  Continue with toe separators. -Discussed daily foot inspection. If there are any changes, to call the office immediately.  -Follow-up in 3 months or sooner if any problems are to arise. In the meantime, encouraged to call the office with any questions, concerns, changes symptoms.  Celesta Gentile, DPM

## 2021-12-05 DIAGNOSIS — Z7984 Long term (current) use of oral hypoglycemic drugs: Secondary | ICD-10-CM | POA: Diagnosis not present

## 2021-12-05 DIAGNOSIS — E119 Type 2 diabetes mellitus without complications: Secondary | ICD-10-CM | POA: Diagnosis not present

## 2021-12-05 DIAGNOSIS — H2513 Age-related nuclear cataract, bilateral: Secondary | ICD-10-CM | POA: Diagnosis not present

## 2021-12-05 LAB — HM DIABETES EYE EXAM

## 2021-12-05 LAB — HEMOGLOBIN A1C: Hemoglobin A1C: 5.4

## 2021-12-21 ENCOUNTER — Telehealth: Payer: Self-pay

## 2021-12-21 ENCOUNTER — Ambulatory Visit (INDEPENDENT_AMBULATORY_CARE_PROVIDER_SITE_OTHER): Payer: Medicare Other | Admitting: Family Medicine

## 2021-12-21 ENCOUNTER — Encounter: Payer: Self-pay | Admitting: Family Medicine

## 2021-12-21 VITALS — BP 130/74 | HR 67 | Temp 97.5°F | Ht 68.0 in | Wt 172.0 lb

## 2021-12-21 DIAGNOSIS — E118 Type 2 diabetes mellitus with unspecified complications: Secondary | ICD-10-CM | POA: Diagnosis not present

## 2021-12-21 DIAGNOSIS — Z0000 Encounter for general adult medical examination without abnormal findings: Secondary | ICD-10-CM | POA: Diagnosis not present

## 2021-12-21 DIAGNOSIS — D696 Thrombocytopenia, unspecified: Secondary | ICD-10-CM

## 2021-12-21 DIAGNOSIS — E785 Hyperlipidemia, unspecified: Secondary | ICD-10-CM | POA: Diagnosis not present

## 2021-12-21 DIAGNOSIS — I10 Essential (primary) hypertension: Secondary | ICD-10-CM

## 2021-12-21 DIAGNOSIS — E1169 Type 2 diabetes mellitus with other specified complication: Secondary | ICD-10-CM

## 2021-12-21 DIAGNOSIS — E538 Deficiency of other specified B group vitamins: Secondary | ICD-10-CM | POA: Diagnosis not present

## 2021-12-21 DIAGNOSIS — C8307 Small cell B-cell lymphoma, spleen: Secondary | ICD-10-CM | POA: Diagnosis not present

## 2021-12-21 LAB — COMPREHENSIVE METABOLIC PANEL
ALT: 20 U/L (ref 0–53)
AST: 34 U/L (ref 0–37)
Albumin: 4.3 g/dL (ref 3.5–5.2)
Alkaline Phosphatase: 67 U/L (ref 39–117)
BUN: 20 mg/dL (ref 6–23)
CO2: 30 mEq/L (ref 19–32)
Calcium: 9 mg/dL (ref 8.4–10.5)
Chloride: 106 mEq/L (ref 96–112)
Creatinine, Ser: 0.75 mg/dL (ref 0.40–1.50)
GFR: 85.2 mL/min (ref >60.00)
Glucose, Bld: 93 mg/dL (ref 70–99)
Potassium: 4 mEq/L (ref 3.5–5.1)
Sodium: 143 mEq/L (ref 135–145)
Total Bilirubin: 0.5 mg/dL (ref 0.2–1.2)
Total Protein: 5.9 g/dL — ABNORMAL LOW (ref 6.0–8.3)

## 2021-12-21 LAB — CBC
HCT: 37.3 % — ABNORMAL LOW (ref 39.0–52.0)
Hemoglobin: 12 g/dL — ABNORMAL LOW (ref 13.0–17.0)
MCHC: 32.2 g/dL (ref 30.0–36.0)
MCV: 90.5 fl (ref 78.0–100.0)
Platelets: 143 10*3/uL — ABNORMAL LOW (ref 150.0–400.0)
RBC: 4.13 Mil/uL — ABNORMAL LOW (ref 4.22–5.81)
RDW: 17 % — ABNORMAL HIGH (ref 11.5–15.5)
WBC: 26.6 10*3/uL (ref 4.0–10.5)

## 2021-12-21 LAB — TSH: TSH: 1.49 u[IU]/mL (ref 0.35–5.50)

## 2021-12-21 LAB — LIPID PANEL
Cholesterol: 93 mg/dL (ref 0–200)
HDL: 37.3 mg/dL — ABNORMAL LOW (ref >39.00)
LDL Cholesterol: 43 mg/dL (ref 0–99)
NonHDL: 55.33
Total CHOL/HDL Ratio: 2
Triglycerides: 64 mg/dL (ref 0.0–149.0)
VLDL: 12.8 mg/dL (ref 0.0–40.0)

## 2021-12-21 LAB — VITAMIN D 25 HYDROXY (VIT D DEFICIENCY, FRACTURES): VITD: 37.29 ng/mL (ref 30.00–100.00)

## 2021-12-21 LAB — MICROALBUMIN / CREATININE URINE RATIO
Creatinine,U: 63.4 mg/dL
Microalb Creat Ratio: 26.6 mg/g (ref 0.0–30.0)
Microalb, Ur: 16.8 mg/dL — ABNORMAL HIGH (ref 0.0–1.9)

## 2021-12-21 LAB — VITAMIN B12: Vitamin B-12: 223 pg/mL (ref 211–911)

## 2021-12-21 MED ORDER — METOPROLOL TARTRATE 25 MG PO TABS: 12.5000 mg | ORAL_TABLET | Freq: Two times a day (BID) | ORAL | 1 refills | Status: DC

## 2021-12-21 MED ORDER — AMLODIPINE BESYLATE 5 MG PO TABS: 5.0000 mg | ORAL_TABLET | Freq: Every day | ORAL | 1 refills | Status: DC

## 2021-12-21 MED ORDER — METFORMIN HCL ER 500 MG PO TB24: ORAL_TABLET | ORAL | 3 refills | Status: DC

## 2021-12-21 MED ORDER — LISINOPRIL 40 MG PO TABS: 40.0000 mg | ORAL_TABLET | Freq: Every day | ORAL | 1 refills | Status: DC

## 2021-12-21 MED ORDER — ATORVASTATIN CALCIUM 40 MG PO TABS: 40.0000 mg | ORAL_TABLET | Freq: Every day | ORAL | 3 refills | Status: DC

## 2021-12-21 NOTE — Progress Notes (Signed)
? ? ? ?Patient ID: Marcus Taylor, male  DOB: [birth year], 81 y.o.   MRN: [id number] ?Patient Care Team  ?  Relationship Specialty Notifications Start End  ?[name], DO PCP - General Family Medicine  01/26/16   ?[name], MD (Inactive) Consulting Physician Gastroenterology  01/26/16   ?[name], MD Consulting Physician Hematology and Oncology  01/26/16   ?[name], MD Referring Physician Cardiology  01/26/16   ?[name], MD Consulting Physician Neurosurgery  01/27/16   ?[name], OD Referring Physician   05/03/16   ? Comment: opthalmology   ?Garald Balding, MD Consulting Physician Orthopedic Surgery  06/14/18   ?Trula Slade, DPM Consulting Physician Podiatry  06/14/18   ? ? ?Chief Complaint  ?Patient presents with  ? Annual Exam  ?  Pt is fasting  ? ? ?Subjective: ? ?Marcus Taylor is a 81 y.o. male present for CPE. ?All past medical history, surgical history, allergies, family history, immunizations, medications and social history were updated in the electronic medical record today. ?All recent labs, ED visits and hospitalizations within the last year were reviewed. ? ?Health maintenance:  ?Colonoscopy: last screen 2022 Completed by Dr. Havery Moros.no further screenings d/t age ? Immunizations:  tdap UTD 2015, influenza UTD (encouraged yearly), PNA series completed. shingrix completed. covid completed ?PSA: > 80 not indicated ?     ?Lab Results  ? ?PSA:  ?Lab Results  ?Component Value Date  ? PSA 1.99 06/05/2019  ? PSA 1.0 09/27/2018  ? PSA 1.85 03/12/2018  ?, pt was counseled on prostate cancer screenings.  ?Assistive device: none ?Oxygen HYW:VPXT ?Patient has a Dental home. ?Hospitalizations/ED visits: reviewed ? ?Hypertension/H/o MI/CAD/stentsx3/iron deficiency anemia: Pt routinely follows with cardiology at [facility], Dr. [name]. Pt has had stent placement and MI in 2015. He is complaint with Lipitor, lisinopril 40 mg, lopressor 12.5 (BID), amlodipine 5 mg qd. He takes a daily  baby asa. He is prescribed nitro, he has not needed it. Plavix  stopped (> 20yrsince stent)  . Patient denies chest pain, shortness of breath, dizziness or lower extremity edema.  ?Diet: low sodium ?  ?Diabetes type 2: Patient reports compliance with  metformin 500 mg Monday Wednesday and Friday. Patient denies dizziness, hyperglycemic or hypoglycemic events. Patient denies numbness, tingling in the extremities or nonhealing wounds of feet.  ?  ?iron deficiency anemia:  ?Iron UTD  ? ? ?  08/17/2021  ?  9:33 AM 07/04/2021  ?  8:53 AM 12/21/2020  ?  9:57 AM 11/02/2020  ? 10:59 AM 08/11/2020  ?  1:37 PM  ?Depression screen PHQ 2/9  ?Decreased Interest 0 0 0 0 0  ?Down, Depressed, Hopeless 0 0 0 0 0  ?PHQ - 2 Score 0 0 0 0 0  ? ?   ? View : No data to display.  ?  ?  ?  ? ?  ?  ? ?  09/14/2021  ?  9:42 AM 08/17/2021  ?  9:35 AM 07/04/2021  ?  8:53 AM 12/21/2020  ?  9:57 AM 11/02/2020  ? 10:59 AM  ?Fall Risk   ?Falls in the past year? 0 0 0 0 0  ?Number falls in past yr: 0 0 0 0 0  ?Injury with Fall? 0 0 0 0 0  ?Risk for fall due to :  Impaired vision No Fall Risks No Fall Risks   ?Follow up Falls evaluation completed Falls prevention discussed Falls evaluation completed    ? ?  Immunization History  ?Administered Date(s) Administered  ? Fluad Quad(high Dose 65+) 04/28/2019, 05/10/2020, 07/04/2021  ? Influenza Split 05/07/2012  ? Influenza, High Dose Seasonal PF 05/03/2016, 07/02/2017, 05/20/2018  ? Influenza,inj,Quad PF,6+ Mos 09/22/2013, 05/25/2014, 05/31/2015  ? PFIZER(Purple Top)SARS-COV-2 Vaccination 10/05/2019, 10/29/2019  ? Pneumococcal Conjugate-13 09/28/2014  ? Pneumococcal Polysaccharide-23 10/05/2011  ? Tdap 03/24/2014  ? Zoster Recombinat (Shingrix) 05/30/2018, 09/17/2018  ? ? ?Past Medical History:  ?Diagnosis Date  ? Anemia   ? Arthritis   ? Cervical radiculopathy 12/22/2019  ? Colonic polyp   ? Concussion 03/05/2018  ? Coronary artery disease   ? stents x 3 (03-2014)  ? Diabetes mellitus   ? Diverticulosis   ?  throughout entire colon  ? History of total right knee replacement 07/09/2018  ? HTN (hypertension)   ? Hypertriglyceridemia   ? Ingrown toenail 10/21/2019  ? Kidney stone   ? Left hamstring injury 11/02/2020  ? Lymphocytosis   ? Metatarsal fracture left  ? left 5th proximal phalanx   ? Neck pain 02/19/2018  ? OSA (obstructive sleep apnea)   ? "Mild" by records  ? PONV (postoperative nausea and vomiting)   ? first time, no problem since then  ? RBBB 01/2016  ? Right inguinal hernia 02/03/2016  ? ? Noted 02/2015 sent to surgery   ? Splenic marginal zone b-cell lymphoma ([place]) 12/18/2011  ? Splenomegaly   ? STEMI (ST elevation myocardial infarction) ([place])   ? August 2015  ? Subarachnoid hematoma ([place]) 03/12/2018  ? Subarachnoid hemorrhage ([place]) 03/05/2018  ? ?No Known Allergies ?Past Surgical History:  ?Procedure Laterality Date  ? CARDIAC CATHETERIZATION    ? stents x 3 (03/2014)  ? CHOLECYSTECTOMY    ? INGUINAL HERNIA REPAIR Right 05/27/2015  ? Procedure: LAPAROSCOPIC REPAIR RIGHT  INGUINAL HERNIA;  Surgeon: Greer Pickerel, MD;  Location: [facility];  Service: General;  Laterality: Right;  ? INSERTION OF MESH Right 05/27/2015  ? Procedure: INSERTION OF MESH;  Surgeon: Greer Pickerel, MD;  Location: [facility];  Service: General;  Laterality: Right;  ? LUMBAR [place]    ? TONSILLECTOMY AND ADENOIDECTOMY    ? TOTAL KNEE ARTHROPLASTY Right 07/09/2018  ? Procedure: RIGHT TOTAL KNEE ARTHROPLASTY;  Surgeon: Garald Balding, MD;  Location: [facility];  Service: Orthopedics;  Laterality: Right;  ? ?Family History  ?Problem Relation Age of Onset  ? Kidney disease Sister   ? Colon cancer Sister 32  ? Colon cancer Brother   ? Bone cancer Brother   ? Arthritis Mother   ? Breast cancer Mother   ? Heart disease Mother   ? Early death Father   ? Arthritis Son   ? Esophageal cancer Sister   ? Bladder Cancer Sister   ? Rectal cancer Neg Hx   ? Stomach cancer Neg Hx   ? ?Social History  ? ?Social History Narrative  ? Married to [place].   ? HS education.  Retired.   ? Former smoker. No drugs or ETOH.  ? Wears seatbelt.  ? Wears dentures.  ? Smoke detector in the home. Firearms  In the home.   ? Feels safe in his relationships.   ? ? ?Allergies as of 12/21/2021   ?No Known Allergies ?  ? ?  ?Medication List  ?  ? ?  ? Accurate as of Dec 21, 2021  9:07 AM. If you have any questions, ask your nurse or doctor.  ?  ?  ? ?  ? ?amLODipine 5 MG  tablet ?Commonly known as: NORVASC ?Take 1 tablet (5 mg total) by mouth daily. ?  ?aspirin EC 81 MG tablet ?Take 81 mg by mouth daily. ?  ?atorvastatin 40 MG tablet ?Commonly known as: LIPITOR ?Take 1 tablet (40 mg total) by mouth daily. ?  ?diclofenac Sodium 1 % Gel ?Commonly known as: VOLTAREN ?APPLY 2 GRAMS TO AFFECTED AREA 4 TIMES A DAY ?  ?esomeprazole 20 MG capsule ?Commonly known as: [place] ?Take 20 mg by mouth every morning. ?  ?ferrous sulfate 324 MG Tbec ?Take 324 mg by mouth daily. ?  ?lisinopril 40 MG tablet ?Commonly known as: ZESTRIL ?Take 1 tablet (40 mg total) by mouth daily. ?  ?metFORMIN 500 MG 24 hr tablet ?Commonly known as: GLUCOPHAGE-XR ?TAKE 1 TABLET BY MOUTH EVERY MONDAY, WEDNESDAY,AND FRIDAY WITH EVENING MEAL ?  ?metoprolol tartrate 25 MG tablet ?Commonly known as: LOPRESSOR ?Take 0.5 tablets (12.5 mg total) by mouth 2 (two) times daily. ?  ?nitroGLYCERIN 0.4 MG SL tablet ?Commonly known as: NITROSTAT ?Place 1 tablet (0.4 mg total) under the tongue every 5 (five) minutes as needed for chest pain. ?  ? ?  ? ?All past medical history, surgical history, allergies, family history, immunizations andmedications were updated in the EMR today and reviewed under the history and medication portions of their EMR.    ? ?Recent Results (from the past 2160 hour(s))  ?HM DIABETES EYE EXAM     Status: None  ? Collection Time: 12/05/21 12:00 AM  ?Result Value Ref Range  ? HM Diabetic Eye Exam No Retinopathy No Retinopathy  ?Hemoglobin A1c     Status: None  ? Collection Time: 12/05/21 12:00 AM  ?Result Value Ref Range  ?  Hemoglobin A1C 5.4   ? ? ?ROS ?14 pt review of systems performed and negative (unless mentioned in an HPI) ? ?Objective: ?BP 130/74   Pulse 67   Temp (!) 97.5 ?F (36.4 ?C)   Ht '5\' 8"'$  (1.727 m)   Wt 172 lb (78 kg)   Sp

## 2021-12-21 NOTE — Telephone Encounter (Signed)
This is his baseline ?

## 2021-12-21 NOTE — Patient Instructions (Signed)
Return in about 24 weeks (around 06/07/2022) for Routine chronic condition follow-up. ? ? ? ? ? ? ? ?Great to see you today.  ?I have refilled the medication(s) we provide.  ? ?If labs were collected, we will inform you of lab results once received either by echart message or telephone call.  ? - echart message- for normal results that have been seen by the patient already.  ? - telephone call: abnormal results or if patient has not viewed results in their echart. ?Health Maintenance After Age 65 ?After age 30, you are at a higher risk for certain long-term diseases and infections as well as injuries from falls. Falls are a major cause of broken bones and head injuries in people who are older than age 90. Getting regular preventive care can help to keep you healthy and well. Preventive care includes getting regular testing and making lifestyle changes as recommended by your health care provider. Talk with your health care provider about: ?Which screenings and tests you should have. A screening is a test that checks for a disease when you have no symptoms. ?A diet and exercise plan that is right for you. ?What should I know about screenings and tests to prevent falls? ?Screening and testing are the best ways to find a health problem early. Early diagnosis and treatment give you the best chance of managing medical conditions that are common after age 53. Certain conditions and lifestyle choices may make you more likely to have a fall. Your health care provider may recommend: ?Regular vision checks. Poor vision and conditions such as cataracts can make you more likely to have a fall. If you wear glasses, make sure to get your prescription updated if your vision changes. ?Medicine review. Work with your health care provider to regularly review all of the medicines you are taking, including over-the-counter medicines. Ask your health care provider about any side effects that may make you more likely to have a fall. Tell  your health care provider if any medicines that you take make you feel dizzy or sleepy. ?Strength and balance checks. Your health care provider may recommend certain tests to check your strength and balance while standing, walking, or changing positions. ?Foot health exam. Foot pain and numbness, as well as not wearing proper footwear, can make you more likely to have a fall. ?Screenings, including: ?Osteoporosis screening. Osteoporosis is a condition that causes the bones to get weaker and break more easily. ?Blood pressure screening. Blood pressure changes and medicines to control blood pressure can make you feel dizzy. ?Depression screening. You may be more likely to have a fall if you have a fear of falling, feel depressed, or feel unable to do activities that you used to do. ?Alcohol use screening. Using too much alcohol can affect your balance and may make you more likely to have a fall. ?Follow these instructions at home: ?Lifestyle ?Do not drink alcohol if: ?Your health care provider tells you not to drink. ?If you drink alcohol: ?Limit how much you have to: ?0-1 drink a day for women. ?0-2 drinks a day for men. ?Know how much alcohol is in your drink. In the [place] one drink equals one 12 oz bottle of beer (355 mL), one 5 oz glass of wine (148 mL), or one 1? oz glass of hard liquor (44 mL). ?Do not use any products that contain nicotine or tobacco. These products include cigarettes, chewing tobacco, and vaping devices, such as e-cigarettes. If you need help quitting, ask  your health care provider. ?Activity ? ?Follow a regular exercise program to stay fit. This will help you maintain your balance. Ask your health care provider what types of exercise are appropriate for you. ?If you need a cane or walker, use it as recommended by your health care provider. ?Wear supportive shoes that have nonskid soles. ?Safety ? ?Remove any tripping hazards, such as rugs, cords, and clutter. ?Install safety equipment such as  grab bars in bathrooms and safety rails on stairs. ?Keep rooms and walkways well-lit. ?General instructions ?Talk with your health care provider about your risks for falling. Tell your health care provider if: ?You fall. Be sure to tell your health care provider about all falls, even ones that seem minor. ?You feel dizzy, tiredness (fatigue), or off-balance. ?Take over-the-counter and prescription medicines only as told by your health care provider. These include supplements. ?Eat a healthy diet and maintain a healthy weight. A healthy diet includes low-fat dairy products, low-fat (lean) meats, and fiber from whole grains, beans, and lots of fruits and vegetables. ?Stay current with your vaccines. ?Schedule regular health, dental, and eye exams. ?Summary ?Having a healthy lifestyle and getting preventive care can help to protect your health and wellness after age 51. ?Screening and testing are the best way to find a health problem early and help you avoid having a fall. Early diagnosis and treatment give you the best chance for managing medical conditions that are more common for people who are older than age 74. ?Falls are a major cause of broken bones and head injuries in people who are older than age 69. Take precautions to prevent a fall at home. ?Work with your health care provider to learn what changes you can make to improve your health and wellness and to prevent falls. ?This information is not intended to replace advice given to you by your health care provider. Make sure you discuss any questions you have with your health care provider. ?Document Revised: 12/20/2020 Document Reviewed: 12/20/2020 ?Elsevier Patient Education ? [place]. ? ?

## 2021-12-21 NOTE — Telephone Encounter (Signed)
CRITICAL VALUE STICKER ? ?CRITICAL VALUE: WBC 26.6  ? ?RECEIVER (on-site recipient of call): Lorriane Shire ? ?DATE & TIME NOTIFIED: 1610 ? ?MESSENGER (representative from lab):Karen  ? ?MD NOTIFIED: Raoul Pitch  ? ?TIME OF NOTIFICATION:1611 ? ?RESPONSE:  ? ?

## 2021-12-22 ENCOUNTER — Telehealth: Payer: Self-pay | Admitting: Family Medicine

## 2021-12-22 NOTE — Telephone Encounter (Signed)
Please call patient: ?Liver, kidney and thyroid function are normal. ?Cholesterol panel looks great. ?Vitamin D levels are normal. ?B12 levels are very low at 223.  I would encourage him to start over-the-counter B12 sublingual 1000 mcg daily.  He has to use the sublingual in order to absorb properly since he is not absorbing it from his diet well.  The other option if he would rather, is to have B12 injections provided here by nurse visit.  If he would like to do so he can schedule nurse visit every 2 weeks for 4 injections and then once monthly . ?Blood cell counts are high but stable from his prior now at 26.6, was 29.3. ?His microalbumin creatinine ratio is normal in his urine. ?

## 2021-12-22 NOTE — Telephone Encounter (Signed)
Spoke with patient regarding results/recommendations.  

## 2021-12-26 ENCOUNTER — Ambulatory Visit (INDEPENDENT_AMBULATORY_CARE_PROVIDER_SITE_OTHER): Payer: Medicare Other

## 2021-12-26 DIAGNOSIS — E538 Deficiency of other specified B group vitamins: Secondary | ICD-10-CM

## 2021-12-26 MED ORDER — CYANOCOBALAMIN 1000 MCG/ML IJ SOLN
1000.0000 ug | Freq: Once | INTRAMUSCULAR | Status: AC
  Administered 2021-12-26: 1000 ug via INTRAMUSCULAR

## 2021-12-26 NOTE — Progress Notes (Signed)
Pt here for monthly B12 injection per Dr. Raoul Pitch ? ?B12 1039mg given IM, and pt tolerated injection well. ? ?Next B12 injection scheduled for 01/10/22 ? ?

## 2021-12-30 ENCOUNTER — Ambulatory Visit: Payer: Medicare Other

## 2021-12-30 ENCOUNTER — Ambulatory Visit: Payer: Medicare Other | Admitting: Podiatry

## 2021-12-30 DIAGNOSIS — M79676 Pain in unspecified toe(s): Secondary | ICD-10-CM | POA: Diagnosis not present

## 2021-12-30 DIAGNOSIS — D689 Coagulation defect, unspecified: Secondary | ICD-10-CM

## 2021-12-30 DIAGNOSIS — L84 Corns and callosities: Secondary | ICD-10-CM | POA: Diagnosis not present

## 2021-12-30 DIAGNOSIS — B351 Tinea unguium: Secondary | ICD-10-CM

## 2021-12-30 LAB — HM DIABETES FOOT EXAM

## 2021-12-30 NOTE — Progress Notes (Signed)
Subjective: 81 y.o. returns the office today for painful, elongated, thickened toenails which he cannot trim himself. Denies any redness or drainage around the nails.  He has been using a toe separator between his big toe and second toe which is still helping.  He has no other concerns today.  No open lesions.    PCP: [name], DO Last seen Dec 21, 2021 Last A1c he reports was 5.4 on December 05, 2021  Objective: AAO 3, NAD DP/PT pulses palpable, CRT less than 3 seconds  Nails hypertrophic, dystrophic, elongated, brittle, discolored 10. There is tenderness overlying the nails 1-5 bilaterally. There is no surrounding erythema or drainage along the nail sites. Minimal hyperkeratotic tissue in the medial second toe DIPJ as well as lateral hallux IPJ, there is a bunion putting pressure.  There is no underlying ulceration drainage or signs of infection. No pain with calf compression, swelling, warmth, erythema.  Assessment: Patient presents with symptomatic onychomycosis  Plan: -Treatment options including alternatives, risks, complications were discussed -Nails sharply debrided 10 without complication/bleeding. -Sharply debrided the mild hyperkeratotic tissue with any complications or bleeding today x 1.  Continue with toe separators. -Discussed daily foot inspection. If there are any changes, to call the office immediately.  -Follow-up in 3 months or sooner if any problems are to arise. In the meantime, encouraged to call the office with any questions, concerns, changes symptoms.  Celesta Gentile, DPM

## 2022-01-10 ENCOUNTER — Ambulatory Visit (INDEPENDENT_AMBULATORY_CARE_PROVIDER_SITE_OTHER): Payer: Medicare Other

## 2022-01-10 DIAGNOSIS — E538 Deficiency of other specified B group vitamins: Secondary | ICD-10-CM

## 2022-01-10 MED ORDER — CYANOCOBALAMIN 1000 MCG/ML IJ SOLN
1000.0000 ug | Freq: Once | INTRAMUSCULAR | Status: AC
  Administered 2022-01-10: 1000 ug via INTRAMUSCULAR

## 2022-01-10 NOTE — Progress Notes (Addendum)
Per orders of Dr. Raoul Pitch, pt is here for B12 injection. pt received B12 injection in left deltoid at 09:45. Given by [place] L. CMA/CPT. Pt tolerated B12 injection well.  Pt will return in 1 month for next injection.

## 2022-01-24 ENCOUNTER — Encounter: Payer: Self-pay | Admitting: Physical Medicine and Rehabilitation

## 2022-01-24 ENCOUNTER — Ambulatory Visit (INDEPENDENT_AMBULATORY_CARE_PROVIDER_SITE_OTHER): Payer: Medicare Other

## 2022-01-24 ENCOUNTER — Ambulatory Visit: Payer: Medicare Other | Admitting: Physical Medicine and Rehabilitation

## 2022-01-24 VITALS — BP 150/87 | HR 65

## 2022-01-24 DIAGNOSIS — E538 Deficiency of other specified B group vitamins: Secondary | ICD-10-CM | POA: Diagnosis not present

## 2022-01-24 DIAGNOSIS — M5412 Radiculopathy, cervical region: Secondary | ICD-10-CM | POA: Diagnosis not present

## 2022-01-24 DIAGNOSIS — M4802 Spinal stenosis, cervical region: Secondary | ICD-10-CM | POA: Diagnosis not present

## 2022-01-24 DIAGNOSIS — M47812 Spondylosis without myelopathy or radiculopathy, cervical region: Secondary | ICD-10-CM | POA: Diagnosis not present

## 2022-01-24 MED ORDER — TRAMADOL HCL 50 MG PO TABS: 50.0000 mg | ORAL_TABLET | Freq: Three times a day (TID) | ORAL | 0 refills | Status: DC | PRN

## 2022-01-24 MED ORDER — CYANOCOBALAMIN 1000 MCG/ML IJ SOLN
1000.0000 ug | Freq: Once | INTRAMUSCULAR | Status: AC
  Administered 2022-01-24: 1000 ug via INTRAMUSCULAR

## 2022-01-24 NOTE — Progress Notes (Signed)
Per orders of Dr. Raoul Pitch, pt is here for B12 injection. pt received B12 injection in Left Deltoid at 10:15 am. Given by [place] L. CMA/CPT. Pt tolerated B12 injection well well. Pt will return in 2 week for next injection.

## 2022-01-24 NOTE — Progress Notes (Unsigned)
Marcus Taylor - 81 y.o. male MRN [id number]  Date of birth: Jun 07, 1941  Office Visit Note: Visit Date: 01/24/2022 PCP: [name], DO Referred by: [name], DO  Subjective: Chief Complaint  Patient presents with   Neck - Pain   HPI: Marcus Taylor is a 81 y.o. male who comes in today for evaluation of chronic, worsening and severe bilateral neck pain radiating to shoulders, right greater than left. Patient was last seen in our office in 2021. Patient reports pain has been ongoing for several years and is exacerbated by movement and activity. He describes pain as aching and sharp sensation, currently rates as 9 out of 10. Patient reports some relief of pain with home exercise regimen, rest and use of over the counter medications. Patients cervical MRI from 2019 exhibits advanced cervical spine degeneration with multilevel spondylolisthesis, multifactorial spinal stenosis with moderate spinal cord mass effect at C3-C4, but no cord signal abnormality. There is also mild spinal stenosis at C2-C3, C4-C5. Patient had right C7-T1 interlaminar epidural steroid injection performed in our office on 03/04/2020 and reports significant and sustained relief of pain until recently. 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 Patient states his severe pain is negatively impacting his daily life, patient also reports difficulty sleeping. Patient denies focal weakness, numbness and tingling. Patient denies recent trauma or falls.    Review of Systems  Musculoskeletal:  Positive for neck pain.  Neurological:  Negative for tingling, sensory change, focal weakness and weakness.  All other systems reviewed and are negative.  Otherwise per HPI.  Assessment & Plan: Visit Diagnoses:    ICD-10-CM   1. Cervical radiculopathy  M54.12 Ambulatory referral to Physical Medicine Rehab    2. Spinal stenosis of cervical region  M48.02 Ambulatory referral to Physical  Medicine Rehab    3. Facet hypertrophy of cervical region  M47.812        Plan: Findings:  Chronic, worsening and severe bilateral neck pain radiating to shoulder, right greater than left. Patient continues to have severe pain despite good conservative therapies such as home exercise regimen, rest and use of over the counter medications. Patients clinical presentation and exam are consistent with cervical radiculopathy, he does have moderate spinal cord mass effect at C3-C4. We believe the next step is to repeat right C7-T1 interlaminar epidural steroid injection under fluoroscopic guidance. Patient is not currently taking anticoagulant medications. I did discuss medication management with patient today and prescribed short course of Tramadol for moderate/severe pain. If patients pain persists post cervical epidural steroid injection we would consider obtaining new cervical MRI imaging. Patient has no questions regarding injection procedure at this time. No red flag symptoms noted upon exam today.     Meds & Orders:  Meds ordered this encounter  Medications   traMADol (ULTRAM) 50 MG tablet    Sig: Take 1 tablet (50 mg total) by mouth every 8 (eight) hours as needed for moderate pain or severe pain.    Dispense:  20 tablet    Refill:  0    Order Specific Question:   Supervising Provider    Answer:   Magnus Sinning [[id number]]    Orders Placed This Encounter  Procedures   Ambulatory referral to Physical Medicine Rehab    Follow-up: Return for Right C7-T1 interlaminar epidural steroid injection.   Procedures: No procedures performed      Clinical History: EXAM: MRI CERVICAL SPINE WITHOUT CONTRAST  TECHNIQUE: Multiplanar, multisequence MR imaging of the cervical spine was performed. No intravenous contrast was administered.   COMPARISON:  Cervical spine radiographs 02/19/2018.   FINDINGS: Alignment: Stable from the recent radiographs. Moderate degenerative appearing  anterolisthesis of C7 on T1. Mild degenerative anterolisthesis of C4 on C5. Mild superimposed straightening of cervical lordosis.   Vertebrae: Visualized bone marrow signal is within normal limits. Intermittent degenerative endplate marrow signal changes, maximal at C6-C7 and C7-T1. No marrow edema or evidence of acute osseous abnormality.   Cord: No definite abnormal spinal cord signal despite stenosis with spinal cord mass effect at C3-C4, details below. The visible upper thoracic spinal cord is within normal limits.   Posterior Fossa, vertebral arteries, paraspinal tissues: Cervicomedullary junction is within normal limits. Negative visible brain parenchyma. Dominant and mildly dolichoectatic left vertebral artery, the right is diminutive. The major vascular flow voids in the neck are preserved.   There is lobular asymmetric and indeterminate appearing signal abnormality at the right thoracic inlet lateral to the right thyroid seen on series 6, image 30. This is not included on the sagittal images. Etiology is unclear, but these seem to be dilated venous vascular structures. No significant regional mass effect.   Disc levels:   C2-C3: Mild to moderate facet hypertrophy greater on the right. Mild broad-based posterior disc bulging. Mild ligament flavum hypertrophy. Mild spinal stenosis. No definite spinal cord mass effect. Moderate bilateral C3 foraminal stenosis.   C3-C4: Mild left but moderate to severe right side facet hypertrophy with severe ligament flavum hypertrophy. Superimposed circumferential disc bulging and endplate spurring with a broad-based posterior component. Spinal stenosis with mild to moderate spinal cord mass effect. Moderate to severe left and severe right C4 foraminal stenosis.   C4-C5: Moderate facet and ligament flavum hypertrophy greater on the right. Superimposed circumferential disc bulge and endplate spurring with a small broad-based central  disc protrusion. Mild spinal stenosis with no cord mass effect. Mild to moderate left and moderate to severe right C5 foraminal stenosis.   C5-C6: Disc space loss with bulky but mostly anterior and foraminal circumferential disc osteophyte complex. Mild facet hypertrophy. No significant spinal stenosis. Moderate to severe bilateral C6 foraminal stenosis.   C6-C7: Disc space loss with circumferential disc osteophyte complex eccentric to the left. Mild ligament flavum hypertrophy. No significant spinal stenosis. Severe left and moderate to severe right C7 foraminal stenosis.   C7-T1: Anterolisthesis with near complete disc space loss. Moderate to severe facet and ligament flavum hypertrophy bilaterally. No significant spinal stenosis, but moderate to severe bilateral C8 foraminal stenosis.   No upper thoracic spinal stenosis.   IMPRESSION: 1. Suggestion of a venous vascular malformation at the right thoracic inlet (series 6, image 30). Ordinarily clinical significance would be doubtful, but in the setting of right shoulder pain recommend further evaluation. Chest CT with IV contrast may be the simplest way to evaluate further. Alternative imaging modalities include right brachial plexus MRI (without and with contrast), right shoulder MRI (larger than standard field-of-view, without and with contrast). 2. Advanced cervical spine degeneration with multilevel spondylolisthesis. No acute osseous abnormality identified. 3. Multifactorial spinal stenosis with up to moderate spinal cord mass effect at C3-C4, but no cord signal abnormality. Mild spinal stenosis at C2-C3, C4-C5. 4. Multifactorial moderate and severe cervical neural foraminal stenosis at all levels.     Electronically Signed   By: Genevie Ann M.D.   On: 03/05/2018 09:39   He reports that he quit smoking about 33 years ago. His smoking  use included cigarettes. He has a 20.00 pack-year smoking history. He has never used  smokeless tobacco.  Recent Labs    03/10/21 0827 07/04/21 0906 12/05/21 0000  HGBA1C  --  5.4*  5.4  5.4  5.4 5.4  LABURIC 5.8  --   --     Objective:  VS:  HT:    WT:   BMI:     BP: (!) 150/87  HR:65bpm  TEMP: ( )  RESP:  Physical Exam Vitals and nursing note reviewed.  HENT:     Head: Normocephalic and atraumatic.     Right Ear: External ear normal.     Left Ear: External ear normal.     Nose: Nose normal.     Mouth/Throat:     Mouth: Mucous membranes are moist.  Eyes:     Extraocular Movements: Extraocular movements intact.  Cardiovascular:     Rate and Rhythm: Normal rate.     Pulses: Normal pulses.  Pulmonary:     Effort: Pulmonary effort is normal.  Abdominal:     General: Abdomen is flat. There is no distension.  Musculoskeletal:        General: Tenderness present.     Cervical back: Tenderness present.     Comments: Discomfort noted with flexion, extension and side-to-side rotation. Patient has good strength in the upper extremities including 5 out of 5 strength in wrist extension, long finger flexion and APB.  There is no atrophy of the hands intrinsically.  Sensation intact bilaterally. Negative Hoffman's sign.   Skin:    General: Skin is warm and dry.     Capillary Refill: Capillary refill takes less than 2 seconds.  Neurological:     General: No focal deficit present.     Mental Status: He is alert and oriented to person, place, and time.  Psychiatric:        Mood and Affect: Mood normal.        Behavior: Behavior normal.     Ortho Exam  Imaging: No results found.  Past Medical/Family/Surgical/Social History: Medications & Allergies reviewed per EMR, new medications updated. Patient Active Problem List   Diagnosis Date Noted   Type 2 diabetes mellitus with hyperlipidemia ([place]) 07/04/2021   Liver cirrhosis ([place]) 07/25/2018   Pancytopenia, acquired ([place]) 04/19/2018   Primary osteoarthritis of right knee 04/19/2018   Decreased hearing of  both ears 03/20/2018   Splenomegaly 03/12/2018   Enlarged prostate without lower urinary tract symptoms (luts) 03/12/2018   IDA (iron deficiency anemia) 08/09/2016   Thrombocytopenia ([place]) 05/30/2016   Deficiency anemia 05/04/2016   History of colon polyps 01/27/2016   History of ST elevation myocardial infarction (STEMI) 01/27/2016   CAD (coronary artery disease) 01/27/2016   Anemia in neoplastic disease 05/25/2014   Splenic marginal zone b-cell lymphoma ([place]) 12/18/2011   HTN (hypertension)    Past Medical History:  Diagnosis Date   Anemia    Arthritis    Cervical radiculopathy 12/22/2019   Colonic polyp    Concussion 03/05/2018   Coronary artery disease    stents x 3 (03-2014)   Diabetes mellitus    Diverticulosis    throughout entire colon   History of total right knee replacement 07/09/2018   HTN (hypertension)    Hypertriglyceridemia    Ingrown toenail 10/21/2019   Kidney stone    Left hamstring injury 11/02/2020   Lymphocytosis    Metatarsal fracture left   left 5th proximal phalanx    Neck pain 02/19/2018  OSA (obstructive sleep apnea)    "Mild" by records   PONV (postoperative nausea and vomiting)    first time, no problem since then   RBBB 01/2016   Right inguinal hernia 02/03/2016   ? Noted 02/2015 sent to surgery    Splenic marginal zone b-cell lymphoma ([place]) 12/18/2011   Splenomegaly    STEMI (ST elevation myocardial infarction) [facility])    August 2015   Subarachnoid hematoma ([place]) 03/12/2018   Subarachnoid hemorrhage ([place]) 03/05/2018   Family History  Problem Relation Age of Onset   Kidney disease Sister    Colon cancer Sister 49   Colon cancer Brother    Bone cancer Brother    Arthritis Mother    Breast cancer Mother    Heart disease Mother    Early death Father    Arthritis Son    Esophageal cancer Sister    Bladder Cancer Sister    Rectal cancer Neg Hx    Stomach cancer Neg Hx    Past Surgical History:  Procedure Laterality Date   CARDIAC  CATHETERIZATION     stents x 3 (03/2014)   CHOLECYSTECTOMY     INGUINAL HERNIA REPAIR Right 05/27/2015   Procedure: LAPAROSCOPIC REPAIR RIGHT  INGUINAL HERNIA;  Surgeon: Greer Pickerel, MD;  Location: [facility];  Service: General;  Laterality: Right;   INSERTION OF MESH Right 05/27/2015   Procedure: INSERTION OF MESH;  Surgeon: Greer Pickerel, MD;  Location: [facility];  Service: General;  Laterality: Right;   LUMBAR DISC SURGERY     TONSILLECTOMY AND ADENOIDECTOMY     TOTAL KNEE ARTHROPLASTY Right 07/09/2018   Procedure: RIGHT TOTAL KNEE ARTHROPLASTY;  Surgeon: Garald Balding, MD;  Location: [facility];  Service: Orthopedics;  Laterality: Right;   Social History   Occupational History   Occupation: retired    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

## 2022-01-24 NOTE — Progress Notes (Unsigned)
Pt state neck pain. Pt state any movement with his neck. Pt state he uses heat and over the counter pain meds to help ease his pain.  Numeric Pain Rating Scale and Functional Assessment Average Pain 10 Pain Right Now 9 My pain is constant, dull, stabbing, and aching Pain is worse with: walking, bending, sitting, standing, some activites, and laying down Pain improves with: heat/ice, medication, and injections   In the last MONTH (on 0-10 scale) has pain interfered with the following?  1. General activity like being  able to carry out your everyday physical activities such as walking, climbing stairs, carrying groceries, or moving a chair?  Rating(5)  2. Relation with others like being able to carry out your usual social activities and roles such as  activities at home, at work and in your community. Rating(6)  3. Enjoyment of life such that you have  been bothered by emotional problems such as feeling anxious, depressed or irritable?  Rating(7)

## 2022-02-07 ENCOUNTER — Ambulatory Visit: Payer: Medicare Other

## 2022-02-08 ENCOUNTER — Encounter: Payer: Self-pay | Admitting: Family Medicine

## 2022-02-08 ENCOUNTER — Ambulatory Visit (INDEPENDENT_AMBULATORY_CARE_PROVIDER_SITE_OTHER): Payer: Medicare Other | Admitting: Family Medicine

## 2022-02-08 VITALS — BP 139/65 | HR 60 | Temp 98.1°F | Ht 68.0 in | Wt 176.8 lb

## 2022-02-08 DIAGNOSIS — E538 Deficiency of other specified B group vitamins: Secondary | ICD-10-CM

## 2022-02-08 DIAGNOSIS — M5412 Radiculopathy, cervical region: Secondary | ICD-10-CM | POA: Diagnosis not present

## 2022-02-08 DIAGNOSIS — R609 Edema, unspecified: Secondary | ICD-10-CM | POA: Diagnosis not present

## 2022-02-08 DIAGNOSIS — R600 Localized edema: Secondary | ICD-10-CM

## 2022-02-08 MED ORDER — FUROSEMIDE 20 MG PO TABS: 10.0000 mg | ORAL_TABLET | Freq: Every day | ORAL | 0 refills | Status: DC | PRN

## 2022-02-08 MED ORDER — CYANOCOBALAMIN 1000 MCG/ML IJ SOLN
1000.0000 ug | Freq: Once | INTRAMUSCULAR | Status: AC
  Administered 2022-02-08: 1000 ug via INTRAMUSCULAR

## 2022-02-08 MED ORDER — GABAPENTIN 100 MG PO CAPS: 100.0000 mg | ORAL_CAPSULE | Freq: Every day | ORAL | 5 refills | Status: DC

## 2022-02-08 NOTE — Progress Notes (Signed)
Marcus Taylor , 1941/02/11, 81 y.o., male MRN: [id number] Patient Care Team    Relationship Specialty Notifications Start End  [name], DO PCP - General Family Medicine  01/24/22   [name], MD (Inactive) Consulting Physician Gastroenterology  01/26/16   [name], MD Consulting Physician Hematology and Oncology  01/26/16   [name], MD Referring Physician Cardiology  01/26/16   [name], MD Consulting Physician Neurosurgery  01/27/16   [name], OD Referring Physician   05/03/16    Comment: opthalmology   Garald Balding, MD Consulting Physician Orthopedic Surgery  06/14/18   Trula Slade, DPM Consulting Physician Podiatry  06/14/18     Chief Complaint  Patient presents with   Leg Swelling    Started about a week ago both legs. Pt not fasting; meds taken this am     Subjective: Pt presents for an OV with complaints of bilateral lower extremity edema that started about 1 week ago in both legs.  He reports he has been elevating his legs since then and the edema has improved.  There is still trace edema present.  He reports he thought it was better, but his wife told him he needs to be seen so he came today.     08/17/2021    9:33 AM 07/04/2021    8:53 AM 12/21/2020    9:57 AM 11/02/2020   10:59 AM 08/11/2020    1:37 PM  Depression screen PHQ 2/9  Decreased Interest 0 0 0 0 0  Down, Depressed, Hopeless 0 0 0 0 0  PHQ - 2 Score 0 0 0 0 0    No Known Allergies Social History   Social History Narrative   Married to [place].    HS education. Retired.    Former smoker. No drugs or ETOH.   Wears seatbelt.   Wears dentures.   Smoke detector in the home. Firearms  In the home.    Feels safe in his relationships.    Past Medical History:  Diagnosis Date   Anemia    Arthritis    Cervical radiculopathy 12/22/2019   Colonic polyp    Concussion 03/05/2018   Coronary artery disease    stents x 3 (03-2014)   Diabetes mellitus     Diverticulosis    throughout entire colon   History of total right knee replacement 07/09/2018   HTN (hypertension)    Hypertriglyceridemia    Ingrown toenail 10/21/2019   Kidney stone    Left hamstring injury 11/02/2020   Lymphocytosis    Metatarsal fracture left   left 5th proximal phalanx    Neck pain 02/19/2018   OSA (obstructive sleep apnea)    "Mild" by records   PONV (postoperative nausea and vomiting)    first time, no problem since then   RBBB 01/2016   Right inguinal hernia 02/03/2016   ? Noted 02/2015 sent to surgery    Splenic marginal zone b-cell lymphoma ([place]) 12/18/2011   Splenomegaly    STEMI (ST elevation myocardial infarction) [facility])    August 2015   Subarachnoid hematoma ([place]) 03/12/2018   Subarachnoid hemorrhage ([place]) 03/05/2018   Past Surgical History:  Procedure Laterality Date   CARDIAC CATHETERIZATION     stents x 3 (03/2014)   CHOLECYSTECTOMY     INGUINAL HERNIA REPAIR Right 05/27/2015   Procedure: LAPAROSCOPIC REPAIR RIGHT  INGUINAL HERNIA;  Surgeon: Greer Pickerel, MD;  [facility];  Service:  General;  Laterality: Right;   INSERTION OF MESH Right 05/27/2015   Procedure: INSERTION OF MESH;  Surgeon: Greer Pickerel, MD;  Location: [facility];  Service: General;  Laterality: Right;   LUMBAR DISC SURGERY     TONSILLECTOMY AND ADENOIDECTOMY     TOTAL KNEE ARTHROPLASTY Right 07/09/2018   Procedure: RIGHT TOTAL KNEE ARTHROPLASTY;  Surgeon: Garald Balding, MD;  Location: [facility];  Service: Orthopedics;  Laterality: Right;   Family History  Problem Relation Age of Onset   Kidney disease Sister    Colon cancer Sister 60   Colon cancer Brother    Bone cancer Brother    Arthritis Mother    Breast cancer Mother    Heart disease Mother    Early death Father    Arthritis Son    Esophageal cancer Sister    Bladder Cancer Sister    Rectal cancer Neg Hx    Stomach cancer Neg Hx    Allergies as of 02/08/2022   No Known Allergies      Medication List         Accurate as of February 08, 2022 11:59 PM. If you have any questions, ask your nurse or doctor.          amLODipine 5 MG tablet Commonly known as: NORVASC Take 1 tablet (5 mg total) by mouth daily.   aspirin EC 81 MG tablet Take 81 mg by mouth daily.   atorvastatin 40 MG tablet Commonly known as: LIPITOR Take 1 tablet (40 mg total) by mouth daily.   diclofenac Sodium 1 % Gel Commonly known as: VOLTAREN APPLY 2 GRAMS TO AFFECTED AREA 4 TIMES A DAY   esomeprazole 20 MG capsule Commonly known as: NEXIUM Take 20 mg by mouth every morning.   ferrous sulfate 324 MG Tbec Take 324 mg by mouth daily.   furosemide 20 MG tablet Commonly known as: LASIX Take 0.5 tablets (10 mg total) by mouth daily as needed. Started by: Howard Pouch, DO   gabapentin 100 MG capsule Commonly known as: NEURONTIN Take 1-3 capsules (100-300 mg total) by mouth at bedtime. Started by: Howard Pouch, DO   lisinopril 40 MG tablet Commonly known as: ZESTRIL Take 1 tablet (40 mg total) by mouth daily.   metFORMIN 500 MG 24 hr tablet Commonly known as: GLUCOPHAGE-XR TAKE 1 TABLET BY MOUTH EVERY MONDAY, WEDNESDAY,AND FRIDAY WITH EVENING MEAL   metoprolol tartrate 25 MG tablet Commonly known as: LOPRESSOR Take 0.5 tablets (12.5 mg total) by mouth 2 (two) times daily.   nitroGLYCERIN 0.4 MG SL tablet Commonly known as: NITROSTAT Place 1 tablet (0.4 mg total) under the tongue every 5 (five) minutes as needed for chest pain.   traMADol 50 MG tablet Commonly known as: ULTRAM Take 1 tablet (50 mg total) by mouth every 8 (eight) hours as needed for moderate pain or severe pain.        All past medical history, surgical history, allergies, family history, immunizations andmedications were updated in the EMR today and reviewed under the history and medication portions of their EMR.     ROS Negative, with the exception of above mentioned in HPI   Objective:  BP 139/65   Pulse 60   Temp 98.1 F (36.7  C)   Ht '5\' 8"'$  (1.727 m)   Wt 176 lb 12.8 oz (80.2 kg)   SpO2 98%   BMI 26.88 kg/m  Body mass index is 26.88 kg/m. Physical Exam Vitals and nursing note reviewed.  Constitutional:      General: He is not in acute distress.    Appearance: Normal appearance. He is not ill-appearing, toxic-appearing or diaphoretic.  HENT:     Head: Normocephalic and atraumatic.  Eyes:     General: No scleral icterus.       Right eye: No discharge.        Left eye: No discharge.     Extraocular Movements: Extraocular movements intact.     Pupils: Pupils are equal, round, and reactive to light.  Cardiovascular:     Rate and Rhythm: Normal rate and regular rhythm.  Pulmonary:     Effort: Pulmonary effort is normal. No respiratory distress.     Breath sounds: Rhonchi present. No wheezing or rales.  Musculoskeletal:     Right lower leg: Edema (Trace) present.     Left lower leg: Edema (Trace) present.  Skin:    General: Skin is warm and dry.     Coloration: Skin is not jaundiced or pale.     Findings: No rash.  Neurological:     Mental Status: He is alert and oriented to person, place, and time. Mental status is at baseline.  Psychiatric:        Mood and Affect: Mood normal.        Behavior: Behavior normal.        Thought Content: Thought content normal.        Judgment: Judgment normal.      No results found. No results found. No results found for this or any previous visit (from the past 24 hour(s)).  Assessment/Plan: Marcus Taylor is a 81 y.o. male present for OV for  Peripheral edema Trace edema today.  Does not appear euvolemic.  He has gained 4.8 pounds, however he does admit he has not been exercising or watching his diet as well.  He is currently not taking prednisone but has had some injections of steroids recently which may have caused some of the edema. Discussed Lasix half a tab once daily if needed only for edema, or noticing 3 to 5 pounds weight gain overnight.  Patient  was encouraged to eat a potassium rich diet if needing Lasix that day.  Cervical radiculopathy Gabapentin refilled for him.  B12 deficiency Due for his schedule nurse visit today.  B12 administered. - cyanocobalamin ((VITAMIN B-12)) injection 1,000 mcg  Reviewed expectations re: course of current medical issues. Discussed self-management of symptoms. Outlined signs and symptoms indicating need for more acute intervention. Patient verbalized understanding and all questions were answered. Patient received an After-Visit Summary.    No orders of the defined types were placed in this encounter.  Meds ordered this encounter  Medications   gabapentin (NEURONTIN) 100 MG capsule    Sig: Take 1-3 capsules (100-300 mg total) by mouth at bedtime.    Dispense:  90 capsule    Refill:  5   furosemide (LASIX) 20 MG tablet    Sig: Take 0.5 tablets (10 mg total) by mouth daily as needed.    Dispense:  45 tablet    Refill:  0   cyanocobalamin ((VITAMIN B-12)) injection 1,000 mcg   Referral Orders  No referral(s) requested today     Note is dictated utilizing voice recognition software. Although note has been proof read prior to signing, occasional typographical errors still can be missed. If any questions arise, please do not hesitate to call for verification.   electronically signed by:  Howard Pouch, DO  [facility]

## 2022-02-08 NOTE — Patient Instructions (Signed)
1/2 tab of lasix  once daily with 3-5 lb weight gain over night.  Make sure to eat potassium rich diet if you do need a lasix.   Bananas, oranges, cantaloupe, grapefruit ,prunes, raisins, and dates,Cooked spinach., Cooked broccoli.,Potatoes.,Sweet potatoes,Mushrooms, Peas,Cucumbers

## 2022-02-20 ENCOUNTER — Encounter: Payer: Self-pay | Admitting: Physical Medicine and Rehabilitation

## 2022-02-20 ENCOUNTER — Ambulatory Visit: Payer: Self-pay

## 2022-02-20 ENCOUNTER — Ambulatory Visit: Payer: Medicare Other | Admitting: Physical Medicine and Rehabilitation

## 2022-02-20 VITALS — BP 111/58 | HR 78

## 2022-02-20 DIAGNOSIS — M5412 Radiculopathy, cervical region: Secondary | ICD-10-CM

## 2022-02-20 MED ORDER — METHYLPREDNISOLONE ACETATE 80 MG/ML IJ SUSP
80.0000 mg | Freq: Once | INTRAMUSCULAR | Status: AC
  Administered 2022-02-20: 80 mg

## 2022-02-20 NOTE — Progress Notes (Signed)
Pt state neck pain. Pt state any movement with his neck. Pt state he uses heat and over the counter pain meds to help ease his pain.  Numeric Pain Rating Scale and Functional Assessment Average Pain 3   In the last MONTH (on 0-10 scale) has pain interfered with the following?  1. General activity like being  able to carry out your everyday physical activities such as walking, climbing stairs, carrying groceries, or moving a chair?  Rating(10)   +Driver, -BT, -Dye Allergies.

## 2022-02-20 NOTE — Procedures (Signed)
Cervical Epidural Steroid Injection - Interlaminar Approach with Fluoroscopic Guidance  Patient: Marcus Taylor      Date of Birth: October 03, 1940 MRN: [id number] PCP: [name], DO      Visit Date: 02/20/2022   Universal Protocol:    Date/Time: 07/10/234:11 PM  Consent Given By: the patient  Position: PRONE  Additional Comments: Vital signs were monitored before and after the procedure. Patient was prepped and draped in the usual sterile fashion. The correct patient, procedure, and site was verified.   Injection Procedure Details:   Procedure diagnoses: Cervical radiculopathy [M54.12]    Meds Administered:  Meds ordered this encounter  Medications   methylPREDNISolone acetate (DEPO-MEDROL) injection 80 mg     Laterality: Right  Location/Site: C7-T1  Needle: 3.5 in., 20 ga. Tuohy  Needle Placement: Paramedian epidural space  Findings:  -Comments: Excellent flow of contrast into the epidural space.  Procedure Details: Using a paramedian approach from the side mentioned above, the region overlying the inferior lamina was localized under fluoroscopic visualization and the soft tissues overlying this structure were infiltrated with 4 ml. of 1% Lidocaine without Epinephrine. A # 20 gauge, Tuohy needle was inserted into the epidural space using a paramedian approach.  The epidural space was localized using loss of resistance along with contralateral oblique bi-planar fluoroscopic views.  After negative aspirate for air, blood, and CSF, a 2 ml. volume of Isovue-250 was injected into the epidural space and the flow of contrast was observed. Radiographs were obtained for documentation purposes.   The injectate was administered into the level noted above.  Additional Comments:  The patient tolerated the procedure well Dressing: 2 x 2 sterile gauze and Band-Aid    Post-procedure details: Patient was observed during the procedure. Post-procedure instructions were  reviewed.  Patient left the clinic in stable condition.

## 2022-02-20 NOTE — Patient Instructions (Signed)

## 2022-02-20 NOTE — Progress Notes (Signed)
Marcus Taylor - 80 y.o. male MRN [id number]  Date of birth: 02/28/1941  Office Visit Note: Visit Date: 02/20/2022 PCP: [name], DO Referred by: [name], DO  Subjective: Chief Complaint  Patient presents with   Neck - Pain   HPI:  Marcus Taylor is a 81 y.o. male who comes in today at the request of Barnet Pall, FNP for planned Right C7-T1 Cervical Interlaminar epidural steroid injection with fluoroscopic guidance.  The patient has failed conservative care including home exercise, medications, time and activity modification.  This injection will be diagnostic and hopefully therapeutic.  Please see requesting physician notes for further details and justification.   ROS Otherwise per HPI.  Assessment & Plan: Visit Diagnoses:    ICD-10-CM   1. Cervical radiculopathy  M54.12 XR C-ARM NO REPORT    Epidural Steroid injection    methylPREDNISolone acetate (DEPO-MEDROL) injection 80 mg      Plan: No additional findings.   Meds & Orders:  Meds ordered this encounter  Medications   methylPREDNISolone acetate (DEPO-MEDROL) injection 80 mg    Orders Placed This Encounter  Procedures   XR C-ARM NO REPORT   Epidural Steroid injection    Follow-up: Return if symptoms worsen or fail to improve.   Procedures: No procedures performed  Cervical Epidural Steroid Injection - Interlaminar Approach with Fluoroscopic Guidance  Patient: Marcus Taylor      Date of Birth: 1940-11-30 MRN: [id number] PCP: [name], DO      Visit Date: 02/20/2022   Universal Protocol:    Date/Time: 07/10/234:11 PM  Consent Given By: the patient  Position: PRONE  Additional Comments: Vital signs were monitored before and after the procedure. Patient was prepped and draped in the usual sterile fashion. The correct patient, procedure, and site was verified.   Injection Procedure Details:   Procedure diagnoses: Cervical radiculopathy [M54.12]    Meds Administered:   Meds ordered this encounter  Medications   methylPREDNISolone acetate (DEPO-MEDROL) injection 80 mg     Laterality: Right  Location/Site: C7-T1  Needle: 3.5 in., 20 ga. Tuohy  Needle Placement: Paramedian epidural space  Findings:  -Comments: Excellent flow of contrast into the epidural space.  Procedure Details: Using a paramedian approach from the side mentioned above, the region overlying the inferior lamina was localized under fluoroscopic visualization and the soft tissues overlying this structure were infiltrated with 4 ml. of 1% Lidocaine without Epinephrine. A # 20 gauge, Tuohy needle was inserted into the epidural space using a paramedian approach.  The epidural space was localized using loss of resistance along with contralateral oblique bi-planar fluoroscopic views.  After negative aspirate for air, blood, and CSF, a 2 ml. volume of Isovue-250 was injected into the epidural space and the flow of contrast was observed. Radiographs were obtained for documentation purposes.   The injectate was administered into the level noted above.  Additional Comments:  The patient tolerated the procedure well Dressing: 2 x 2 sterile gauze and Band-Aid    Post-procedure details: Patient was observed during the procedure. Post-procedure instructions were reviewed.  Patient left the clinic in stable condition.   Clinical History: EXAM: MRI CERVICAL SPINE WITHOUT CONTRAST   TECHNIQUE: Multiplanar, multisequence MR imaging of the cervical spine was performed. No intravenous contrast was administered.   COMPARISON:  Cervical spine radiographs 02/19/2018.   FINDINGS: Alignment: Stable from the recent radiographs. Moderate degenerative appearing anterolisthesis of C7 on T1. Mild degenerative anterolisthesis of C4 on C5.  Mild superimposed straightening of cervical lordosis.   Vertebrae: Visualized bone marrow signal is within normal limits. Intermittent degenerative endplate  marrow signal changes, maximal at C6-C7 and C7-T1. No marrow edema or evidence of acute osseous abnormality.   Cord: No definite abnormal spinal cord signal despite stenosis with spinal cord mass effect at C3-C4, details below. The visible upper thoracic spinal cord is within normal limits.   Posterior Fossa, vertebral arteries, paraspinal tissues: Cervicomedullary junction is within normal limits. Negative visible brain parenchyma. Dominant and mildly dolichoectatic left vertebral artery, the right is diminutive. The major vascular flow voids in the neck are preserved.   There is lobular asymmetric and indeterminate appearing signal abnormality at the right thoracic inlet lateral to the right thyroid seen on series 6, image 30. This is not included on the sagittal images. Etiology is unclear, but these seem to be dilated venous vascular structures. No significant regional mass effect.   Disc levels:   C2-C3: Mild to moderate facet hypertrophy greater on the right. Mild broad-based posterior disc bulging. Mild ligament flavum hypertrophy. Mild spinal stenosis. No definite spinal cord mass effect. Moderate bilateral C3 foraminal stenosis.   C3-C4: Mild left but moderate to severe right side facet hypertrophy with severe ligament flavum hypertrophy. Superimposed circumferential disc bulging and endplate spurring with a broad-based posterior component. Spinal stenosis with mild to moderate spinal cord mass effect. Moderate to severe left and severe right C4 foraminal stenosis.   C4-C5: Moderate facet and ligament flavum hypertrophy greater on the right. Superimposed circumferential disc bulge and endplate spurring with a small broad-based central disc protrusion. Mild spinal stenosis with no cord mass effect. Mild to moderate left and moderate to severe right C5 foraminal stenosis.   C5-C6: Disc space loss with bulky but mostly anterior and foraminal circumferential disc  osteophyte complex. Mild facet hypertrophy. No significant spinal stenosis. Moderate to severe bilateral C6 foraminal stenosis.   C6-C7: Disc space loss with circumferential disc osteophyte complex eccentric to the left. Mild ligament flavum hypertrophy. No significant spinal stenosis. Severe left and moderate to severe right C7 foraminal stenosis.   C7-T1: Anterolisthesis with near complete disc space loss. Moderate to severe facet and ligament flavum hypertrophy bilaterally. No significant spinal stenosis, but moderate to severe bilateral C8 foraminal stenosis.   No upper thoracic spinal stenosis.   IMPRESSION: 1. Suggestion of a venous vascular malformation at the right thoracic inlet (series 6, image 30). Ordinarily clinical significance would be doubtful, but in the setting of right shoulder pain recommend further evaluation. Chest CT with IV contrast may be the simplest way to evaluate further. Alternative imaging modalities include right brachial plexus MRI (without and with contrast), right shoulder MRI (larger than standard field-of-view, without and with contrast). 2. Advanced cervical spine degeneration with multilevel spondylolisthesis. No acute osseous abnormality identified. 3. Multifactorial spinal stenosis with up to moderate spinal cord mass effect at C3-C4, but no cord signal abnormality. Mild spinal stenosis at C2-C3, C4-C5. 4. Multifactorial moderate and severe cervical neural foraminal stenosis at all levels.     Electronically Signed   By: Genevie Ann M.D.   On: 03/05/2018 09:39     Objective:  VS:  HT:    WT:   BMI:     BP:(!) 111/58  HR:78bpm  TEMP: ( )  RESP:  Physical Exam Vitals and nursing note reviewed.  Constitutional:      General: He is not in acute distress.    Appearance: Normal appearance. He is not ill-appearing.  HENT:     Head: Normocephalic and atraumatic.     Right Ear: External ear normal.     Left Ear: External ear normal.   Eyes:     Extraocular Movements: Extraocular movements intact.  Cardiovascular:     Rate and Rhythm: Normal rate.     Pulses: Normal pulses.  Abdominal:     General: There is no distension.     Palpations: Abdomen is soft.  Musculoskeletal:        General: No signs of injury.     Cervical back: Neck supple. Tenderness present. No rigidity.     Right lower leg: No edema.     Left lower leg: No edema.     Comments: Patient has good strength in the upper extremities with 5 out of 5 strength in wrist extension long finger flexion APB.  No intrinsic hand muscle atrophy.  Negative Hoffmann's test.  Lymphadenopathy:     Cervical: No cervical adenopathy.  Skin:    Findings: No erythema or rash.  Neurological:     General: No focal deficit present.     Mental Status: He is alert and oriented to person, place, and time.     Sensory: No sensory deficit.     Motor: No weakness or abnormal muscle tone.     Coordination: Coordination normal.  Psychiatric:        Mood and Affect: Mood normal.        Behavior: Behavior normal.      Imaging: XR C-ARM NO REPORT  Result Date: 02/20/2022 Please see Notes tab for imaging impression.

## 2022-03-08 ENCOUNTER — Ambulatory Visit (INDEPENDENT_AMBULATORY_CARE_PROVIDER_SITE_OTHER): Payer: Medicare Other

## 2022-03-08 DIAGNOSIS — E538 Deficiency of other specified B group vitamins: Secondary | ICD-10-CM

## 2022-03-08 MED ORDER — CYANOCOBALAMIN 1000 MCG/ML IJ SOLN
1000.0000 ug | Freq: Once | INTRAMUSCULAR | Status: AC
  Administered 2022-03-08: 1000 ug via INTRAMUSCULAR

## 2022-03-08 NOTE — Progress Notes (Signed)
   Pt here for monthly B12 injection per Dr. Raoul Pitch   B12 1043mg given IM, and pt tolerated injection well.   Next B12 injection scheduled for 1 month

## 2022-03-13 ENCOUNTER — Inpatient Hospital Stay: Payer: Medicare Other | Admitting: Hematology and Oncology

## 2022-03-13 ENCOUNTER — Other Ambulatory Visit: Payer: Self-pay

## 2022-03-13 ENCOUNTER — Encounter: Payer: Self-pay | Admitting: Hematology and Oncology

## 2022-03-13 ENCOUNTER — Telehealth: Payer: Self-pay

## 2022-03-13 ENCOUNTER — Inpatient Hospital Stay: Payer: Medicare Other | Attending: Hematology and Oncology

## 2022-03-13 VITALS — BP 126/64 | HR 85 | Temp 98.1°F | Resp 18 | Ht 68.0 in | Wt 173.0 lb

## 2022-03-13 DIAGNOSIS — C8300 Small cell B-cell lymphoma, unspecified site: Secondary | ICD-10-CM | POA: Diagnosis not present

## 2022-03-13 DIAGNOSIS — C8307 Small cell B-cell lymphoma, spleen: Secondary | ICD-10-CM | POA: Diagnosis not present

## 2022-03-13 DIAGNOSIS — D61818 Other pancytopenia: Secondary | ICD-10-CM

## 2022-03-13 DIAGNOSIS — D500 Iron deficiency anemia secondary to blood loss (chronic): Secondary | ICD-10-CM

## 2022-03-13 DIAGNOSIS — D696 Thrombocytopenia, unspecified: Secondary | ICD-10-CM

## 2022-03-13 LAB — CMP (CANCER CENTER ONLY)
ALT: 16 U/L (ref 0–44)
AST: 27 U/L (ref 15–41)
Albumin: 4.2 g/dL (ref 3.5–5.0)
Alkaline Phosphatase: 57 U/L (ref 38–126)
Anion gap: 3 — ABNORMAL LOW (ref 5–15)
BUN: 28 mg/dL — ABNORMAL HIGH (ref 8–23)
CO2: 29 mmol/L (ref 22–32)
Calcium: 8.9 mg/dL (ref 8.9–10.3)
Chloride: 111 mmol/L (ref 98–111)
Creatinine: 0.77 mg/dL (ref 0.61–1.24)
GFR, Estimated: >60 mL/min (ref >60)
Glucose, Bld: 104 mg/dL — ABNORMAL HIGH (ref 70–99)
Potassium: 4 mmol/L (ref 3.5–5.1)
Sodium: 143 mmol/L (ref 135–145)
Total Bilirubin: 0.4 mg/dL (ref 0.3–1.2)
Total Protein: 6 g/dL — ABNORMAL LOW (ref 6.5–8.1)

## 2022-03-13 LAB — CBC WITH DIFFERENTIAL (CANCER CENTER ONLY)
Abs Immature Granulocytes: 0.03 10*3/uL (ref 0.00–0.07)
Basophils Absolute: 0.1 10*3/uL (ref 0.0–0.1)
Basophils Relative: 0 %
Eosinophils Absolute: 0.2 10*3/uL (ref 0.0–0.5)
Eosinophils Relative: 1 %
HCT: 36.1 % — ABNORMAL LOW (ref 39.0–52.0)
Hemoglobin: 11.8 g/dL — ABNORMAL LOW (ref 13.0–17.0)
Immature Granulocytes: 0 %
Lymphocytes Relative: 88 %
Lymphs Abs: 21.7 10*3/uL — ABNORMAL HIGH (ref 0.7–4.0)
MCH: 28.8 pg (ref 26.0–34.0)
MCHC: 32.7 g/dL (ref 30.0–36.0)
MCV: 88 fL (ref 80.0–100.0)
Monocytes Absolute: 0.5 10*3/uL (ref 0.1–1.0)
Monocytes Relative: 2 %
Neutro Abs: 2.3 10*3/uL (ref 1.7–7.7)
Neutrophils Relative %: 9 %
Platelet Count: 111 10*3/uL — ABNORMAL LOW (ref 150–400)
RBC: 4.1 MIL/uL — ABNORMAL LOW (ref 4.22–5.81)
RDW: 16.8 % — ABNORMAL HIGH (ref 11.5–15.5)
Smear Review: NORMAL
WBC Count: 24.7 10*3/uL — ABNORMAL HIGH (ref 4.0–10.5)
nRBC: 0 % (ref 0.0–0.2)

## 2022-03-13 LAB — IRON AND IRON BINDING CAPACITY (CC-WL,HP ONLY)
Iron: 72 ug/dL (ref 45–182)
Saturation Ratios: 28 % (ref 17.9–39.5)
TIBC: 259 ug/dL (ref 250–450)
UIBC: 187 ug/dL (ref 117–376)

## 2022-03-13 LAB — FERRITIN: Ferritin: 45 ng/mL (ref 24–336)

## 2022-03-13 NOTE — Telephone Encounter (Signed)
Called and given below message. He verbalized understanding and appreciated the call. 

## 2022-03-13 NOTE — Assessment & Plan Note (Signed)
He has mild intermittent thrombocytopenia but not symptomatic Observe closely There is no contraindication for him to take antiplatelet agents From the anemia standpoint, suspect this is anemia of chronic illness Iron studies are pending and will call the patient with test results He does not need vigilant surveillance in these regards

## 2022-03-13 NOTE — Assessment & Plan Note (Signed)
He has fluctuation of his white blood cell count and lymphocyte count but overall asymptomatic He had very mild thrombocytopenia but not symptomatic For now, I recommend close observation The patient is educated to watch for signs and symptoms of cancer recurrence I will see him again in 9 months for further follow-up

## 2022-03-13 NOTE — Progress Notes (Signed)
[facility] OFFICE PROGRESS NOTE  Patient Care Team: [name], DO as PCP - General (Family Medicine) [name], MD (Inactive) as Consulting Physician (Gastroenterology) [name], MD as Consulting Physician (Hematology and Oncology) [name], MD as Referring Physician (Cardiology) [name], MD as Consulting Physician (Neurosurgery) [name], OD as Referring Physician Garald Balding, MD as Consulting Physician (Orthopedic Surgery) Trula Slade, DPM as Consulting Physician (Podiatry)  ASSESSMENT & PLAN:  Splenic marginal zone b-cell lymphoma [facility]) He has fluctuation of his white blood cell count and lymphocyte count but overall asymptomatic He had very mild thrombocytopenia but not symptomatic For now, I recommend close observation The patient is educated to watch for signs and symptoms of cancer recurrence I will see him again in 9 months for further follow-up  Pancytopenia, acquired [facility]) He has mild intermittent thrombocytopenia but not symptomatic Observe closely There is no contraindication for him to take antiplatelet agents From the anemia standpoint, suspect this is anemia of chronic illness Iron studies are pending and will call the patient with test results He does not need vigilant surveillance in these regards  Orders Placed This Encounter  Procedures   CMP ([facility] only)    Standing Status:   Future    Standing Expiration Date:   03/14/2023   CBC with Differential (Cancer Center Only)    Standing Status:   Future    Standing Expiration Date:   03/14/2023    All questions were answered. The patient knows to call the clinic with any problems, questions or concerns. The total time spent in the appointment was 20 minutes encounter with patients including review of chart and various tests results, discussions about plan of care and coordination of care plan   [name], MD 03/13/2022 9:50 AM  INTERVAL  HISTORY: Please see below for problem oriented charting. he returns for active surveillance for low-grade lymphoma and anemia He is doing well No recent bleeding No new lymphadenopathy Denies recent infection  REVIEW OF SYSTEMS:   Constitutional: Denies fevers, chills or abnormal weight loss Eyes: Denies blurriness of vision Ears, nose, mouth, throat, and face: Denies mucositis or sore throat Respiratory: Denies cough, dyspnea or wheezes Cardiovascular: Denies palpitation, chest discomfort or lower extremity swelling Gastrointestinal:  Denies nausea, heartburn or change in bowel habits Skin: Denies abnormal skin rashes Lymphatics: Denies new lymphadenopathy or easy bruising Neurological:Denies numbness, tingling or new weaknesses Behavioral/Psych: Mood is stable, no new changes  All other systems were reviewed with the patient and are negative.  I have reviewed the past medical history, past surgical history, social history and family history with the patient and they are unchanged from previous note.  ALLERGIES:  has No Known Allergies.  MEDICATIONS:  Current Outpatient Medications  Medication Sig Dispense Refill   amLODipine (NORVASC) 5 MG tablet Take 1 tablet (5 mg total) by mouth daily. 90 tablet 1   aspirin EC 81 MG tablet Take 81 mg by mouth daily.     atorvastatin (LIPITOR) 40 MG tablet Take 1 tablet (40 mg total) by mouth daily. 90 tablet 3   diclofenac Sodium (VOLTAREN) 1 % GEL APPLY 2 GRAMS TO AFFECTED AREA 4 TIMES A DAY 600 g 1   esomeprazole (NEXIUM) 20 MG capsule Take 20 mg by mouth every morning.     ferrous sulfate 324 MG TBEC Take 324 mg by mouth daily.     furosemide (LASIX) 20 MG tablet Take 0.5 tablets (10 mg total) by  mouth daily as needed. 45 tablet 0   gabapentin (NEURONTIN) 100 MG capsule Take 1-3 capsules (100-300 mg total) by mouth at bedtime. 90 capsule 5   lisinopril (ZESTRIL) 40 MG tablet Take 1 tablet (40 mg total) by mouth daily. 90 tablet 1    metFORMIN (GLUCOPHAGE-XR) 500 MG 24 hr tablet TAKE 1 TABLET BY MOUTH EVERY MONDAY, WEDNESDAY,AND FRIDAY WITH EVENING MEAL 40 tablet 3   metoprolol tartrate (LOPRESSOR) 25 MG tablet Take 0.5 tablets (12.5 mg total) by mouth 2 (two) times daily. 90 tablet 1   nitroGLYCERIN (NITROSTAT) 0.4 MG SL tablet Place 1 tablet (0.4 mg total) under the tongue every 5 (five) minutes as needed for chest pain. 10 tablet 0   traMADol (ULTRAM) 50 MG tablet Take 1 tablet (50 mg total) by mouth every 8 (eight) hours as needed for moderate pain or severe pain. 20 tablet 0   No current facility-administered medications for this visit.    SUMMARY OF ONCOLOGIC HISTORY: Oncology History  Splenic marginal zone b-cell lymphoma ([place])  12/18/2011 Initial Diagnosis   Splenic marginal zone b-cell lymphoma ([place])   11/08/2020 Cancer Staging   Staging form: Hodgkin and Non-Hodgkin Lymphoma, AJCC 7th Edition - Clinical: S - Spleen - Signed by [name], MD on 11/08/2020 Staged by: Managing physician Stage prefix: Initial diagnosis     PHYSICAL EXAMINATION: ECOG PERFORMANCE STATUS: 0 - Asymptomatic  Vitals:   03/13/22 0831  BP: 126/64  Pulse: 85  Resp: 18  Temp: 98.1 F (36.7 C)  SpO2: 98%   Filed Weights   03/13/22 0831  Weight: 173 lb (78.5 kg)    GENERAL:alert, no distress and comfortable NEURO: alert & oriented x 3 with fluent speech, no focal motor/sensory deficits  LABORATORY DATA:  I have reviewed the data as listed    Component Value Date/Time   NA 143 03/13/2022 0825   NA 144 05/31/2015 1404   K 4.0 03/13/2022 0825   K 4.7 05/31/2015 1404   CL 111 03/13/2022 0825   CL 102 10/07/2012 1013   CO2 29 03/13/2022 0825   CO2 29 05/31/2015 1404   GLUCOSE 104 (H) 03/13/2022 0825   GLUCOSE 99 05/31/2015 1404   GLUCOSE 199 (H) 10/07/2012 1013   BUN 28 (H) 03/13/2022 0825   BUN 18.3 05/31/2015 1404   CREATININE 0.77 03/13/2022 0825   CREATININE 0.86 03/13/2017 0759   CREATININE 0.9 05/31/2015 1404    CALCIUM 8.9 03/13/2022 0825   CALCIUM 9.2 05/31/2015 1404   PROT 6.0 (L) 03/13/2022 0825   PROT 6.3 (L) 05/31/2015 1404   ALBUMIN 4.2 03/13/2022 0825   ALBUMIN 3.9 05/31/2015 1404   AST 27 03/13/2022 0825   AST 28 05/31/2015 1404   ALT 16 03/13/2022 0825   ALT 23 05/31/2015 1404   ALKPHOS 57 03/13/2022 0825   ALKPHOS 79 05/31/2015 1404   BILITOT 0.4 03/13/2022 0825   BILITOT 0.46 05/31/2015 1404   GFRNONAA >60 03/13/2022 0825   GFRNONAA 85 03/13/2017 0759   GFRAA >60 05/03/2020 0857   GFRAA >89 03/13/2017 0759    No results found for: "SPEP", "UPEP"  Lab Results  Component Value Date   WBC 24.7 (H) 03/13/2022   NEUTROABS 2.3 03/13/2022   HGB 11.8 (L) 03/13/2022   HCT 36.1 (L) 03/13/2022   MCV 88.0 03/13/2022   PLT 111 (L) 03/13/2022      Chemistry      Component Value Date/Time   NA 143 03/13/2022 0825   NA 144 05/31/2015  1404   K 4.0 03/13/2022 0825   K 4.7 05/31/2015 1404   CL 111 03/13/2022 0825   CL 102 10/07/2012 1013   CO2 29 03/13/2022 0825   CO2 29 05/31/2015 1404   BUN 28 (H) 03/13/2022 0825   BUN 18.3 05/31/2015 1404   CREATININE 0.77 03/13/2022 0825   CREATININE 0.86 03/13/2017 0759   CREATININE 0.9 05/31/2015 1404      Component Value Date/Time   CALCIUM 8.9 03/13/2022 0825   CALCIUM 9.2 05/31/2015 1404   ALKPHOS 57 03/13/2022 0825   ALKPHOS 79 05/31/2015 1404   AST 27 03/13/2022 0825   AST 28 05/31/2015 1404   ALT 16 03/13/2022 0825   ALT 23 05/31/2015 1404   BILITOT 0.4 03/13/2022 0825   BILITOT 0.46 05/31/2015 1404

## 2022-03-13 NOTE — Telephone Encounter (Signed)
-----   Message from [name], MD sent at 03/13/2022  3:59 PM EDT ----- His iron studies are ok, please let him know Continue iron supplement daily

## 2022-03-31 ENCOUNTER — Ambulatory Visit: Payer: Medicare Other | Admitting: Podiatry

## 2022-03-31 DIAGNOSIS — B351 Tinea unguium: Secondary | ICD-10-CM | POA: Diagnosis not present

## 2022-03-31 DIAGNOSIS — D689 Coagulation defect, unspecified: Secondary | ICD-10-CM

## 2022-03-31 DIAGNOSIS — M79676 Pain in unspecified toe(s): Secondary | ICD-10-CM

## 2022-04-01 NOTE — Progress Notes (Signed)
Subjective: 81 y.o. returns the office today for painful, elongated, thickened toenails which he cannot trim himself. Denies any redness or drainage around the nails.  No new concerns.  No open lesions.  PCP: Howard Pouch A, DO Last seen 02/08/2022 Last A1c he reports was 5.4 on December 05, 2021  Objective: AAO 3, NAD DP/PT pulses palpable, CRT less than 3 seconds  Nails hypertrophic, dystrophic, elongated, brittle, discolored 10. There is tenderness overlying the nails 1-5 bilaterally. There is no surrounding erythema or drainage along the nail sites. No pain with calf compression, swelling, warmth, erythema.  Assessment: Patient presents with symptomatic onychomycosis  Plan: -Treatment options including alternatives, risks, complications were discussed -Nails sharply debrided 10 without complication/bleeding. -Discussed daily foot inspection. If there are any changes, to call the office immediately.  -Follow-up in 3 months or sooner if any problems are to arise. In the meantime, encouraged to call the office with any questions, concerns, changes symptoms.  Celesta Gentile, DPM

## 2022-04-05 ENCOUNTER — Ambulatory Visit (INDEPENDENT_AMBULATORY_CARE_PROVIDER_SITE_OTHER): Payer: Medicare Other

## 2022-04-05 DIAGNOSIS — E538 Deficiency of other specified B group vitamins: Secondary | ICD-10-CM | POA: Diagnosis not present

## 2022-04-05 MED ORDER — CYANOCOBALAMIN 1000 MCG/ML IJ SOLN
1000.0000 ug | Freq: Once | INTRAMUSCULAR | Status: AC
  Administered 2022-04-05: 1000 ug via INTRAMUSCULAR

## 2022-04-05 NOTE — Progress Notes (Signed)
Pt here for monthly B12 injection per Dr.Kuneff   B12 1000mcg given IM left deltoid , and pt tolerated injection well.   Next B12 injection scheduled for 1 month.     

## 2022-05-08 ENCOUNTER — Ambulatory Visit (INDEPENDENT_AMBULATORY_CARE_PROVIDER_SITE_OTHER): Payer: Medicare Other

## 2022-05-08 DIAGNOSIS — E538 Deficiency of other specified B group vitamins: Secondary | ICD-10-CM

## 2022-05-08 DIAGNOSIS — Z23 Encounter for immunization: Secondary | ICD-10-CM

## 2022-05-08 MED ORDER — CYANOCOBALAMIN 1000 MCG/ML IJ SOLN
1000.0000 ug | Freq: Once | INTRAMUSCULAR | Status: AC
  Administered 2022-05-08: 1000 ug via INTRAMUSCULAR

## 2022-05-09 ENCOUNTER — Other Ambulatory Visit: Payer: Self-pay | Admitting: Family Medicine

## 2022-05-09 NOTE — Progress Notes (Signed)
Pt here for monthly B12 injection per Dr.Kuneff   B12 1000mcg given IM left deltoid , and pt tolerated injection well.   Next B12 injection scheduled for 1 month.     

## 2022-05-26 ENCOUNTER — Encounter: Payer: Self-pay | Admitting: Family Medicine

## 2022-05-26 ENCOUNTER — Ambulatory Visit (INDEPENDENT_AMBULATORY_CARE_PROVIDER_SITE_OTHER): Payer: Medicare Other | Admitting: Family Medicine

## 2022-05-26 VITALS — BP 126/67 | HR 69 | Temp 97.4°F | Wt 171.4 lb

## 2022-05-26 DIAGNOSIS — D696 Thrombocytopenia, unspecified: Secondary | ICD-10-CM

## 2022-05-26 DIAGNOSIS — R200 Anesthesia of skin: Secondary | ICD-10-CM

## 2022-05-26 DIAGNOSIS — I252 Old myocardial infarction: Secondary | ICD-10-CM

## 2022-05-26 DIAGNOSIS — C8307 Small cell B-cell lymphoma, spleen: Secondary | ICD-10-CM | POA: Diagnosis not present

## 2022-05-26 DIAGNOSIS — E785 Hyperlipidemia, unspecified: Secondary | ICD-10-CM

## 2022-05-26 DIAGNOSIS — M79641 Pain in right hand: Secondary | ICD-10-CM | POA: Diagnosis not present

## 2022-05-26 DIAGNOSIS — E1169 Type 2 diabetes mellitus with other specified complication: Secondary | ICD-10-CM

## 2022-05-26 DIAGNOSIS — I2510 Atherosclerotic heart disease of native coronary artery without angina pectoris: Secondary | ICD-10-CM

## 2022-05-26 DIAGNOSIS — I10 Essential (primary) hypertension: Secondary | ICD-10-CM

## 2022-05-26 DIAGNOSIS — D630 Anemia in neoplastic disease: Secondary | ICD-10-CM

## 2022-05-26 DIAGNOSIS — R202 Paresthesia of skin: Secondary | ICD-10-CM

## 2022-05-26 LAB — POCT GLYCOSYLATED HEMOGLOBIN (HGB A1C)
HbA1c POC (<> result, manual entry): 5.3 % (ref 4.0–5.6)
HbA1c, POC (controlled diabetic range): 5.3 % (ref 0.0–7.0)
HbA1c, POC (prediabetic range): 5.3 % — AB (ref 5.7–6.4)
Hemoglobin A1C: 5.3 % (ref 4.0–5.6)

## 2022-05-26 MED ORDER — ATORVASTATIN CALCIUM 40 MG PO TABS: 40.0000 mg | ORAL_TABLET | Freq: Every day | ORAL | 3 refills | Status: DC

## 2022-05-26 MED ORDER — AMLODIPINE BESYLATE 5 MG PO TABS: 5.0000 mg | ORAL_TABLET | Freq: Every day | ORAL | 1 refills | Status: DC

## 2022-05-26 MED ORDER — LISINOPRIL 40 MG PO TABS: 40.0000 mg | ORAL_TABLET | Freq: Every day | ORAL | 1 refills | Status: DC

## 2022-05-26 MED ORDER — FUROSEMIDE 20 MG PO TABS: 10.0000 mg | ORAL_TABLET | Freq: Every day | ORAL | 0 refills | Status: DC | PRN

## 2022-05-26 MED ORDER — METOPROLOL TARTRATE 25 MG PO TABS: 12.5000 mg | ORAL_TABLET | Freq: Two times a day (BID) | ORAL | 1 refills | Status: DC

## 2022-05-26 NOTE — Patient Instructions (Signed)
No follow-ups on file.        Great to see you today.  I have refilled the medication(s) we provide.   If labs were collected, we will inform you of lab results once received either by echart message or telephone call.   - echart message- for normal results that have been seen by the patient already.   - telephone call: abnormal results or if patient has not viewed results in their echart.  

## 2022-05-26 NOTE — Progress Notes (Unsigned)
Patient ID: Marcus Taylor, male  DOB: 11/24/1940, 81 y.o.   MRN: [id number] Patient Care Team    Relationship Specialty Notifications Start End  Marcus Taylor PCP - General Family Medicine  01/24/22   Marcus Castle, MD (Inactive) Consulting Physician Gastroenterology  01/26/16   Marcus Lark, MD Consulting Physician Hematology and Oncology  01/26/16   Marcus Records, MD Referring Physician Cardiology  01/26/16   Marcus Chimera, MD Consulting Physician Neurosurgery  01/27/16   Marcus Taylor, OD Referring Physician   05/03/16    Comment: opthalmology   Marcus Balding, MD Consulting Physician Orthopedic Surgery  06/14/18   Marcus Taylor, DPM Consulting Physician Podiatry  06/14/18     Chief Complaint  Patient presents with   Diabetes    Pt is fasting    Subjective:  Marcus Taylor is a 81 y.o. male present for [facility]. All past medical history, surgical history, allergies, family history, immunizations, medications and social history were updated in the electronic medical record today. All recent labs, ED visits and hospitalizations within the last year were reviewed.  Hypertension/H/o MI/CAD/stentsx3/iron deficiency anemia: Pt routinely follows with cardiology at [place], Marcus Taylor. Pt has had stent placement and MI in 2015. He is compliant with Lipitor, lisinopril 40 mg, lopressor 12.5 (BID), amlodipine 5 mg qd. He takes a daily baby asa. Lasix 10-20 mg qd prn.  He is prescribed nitro, he -has not needed it. Plavix  stopped (> 1yrsince stent)  . Patient denies chest pain,shortness of breath, dizziness or lower extremity edema.  Diet: low sodium   Diabetes type 2: Patient reports compliance with  metformin 500 mg Monday Wednesday and Friday. Patient denies dizziness, hyperglycemic or hypoglycemic events. Patient denies numbness, tingling in the extremities or nonhealing wounds of feet.    iron deficiency anemia:  Iron UTD   Hand discomfort: Patient reports the numbness  in his thumb index and middle finger is becoming more troublesome in his right hand.     08/17/2021    9:33 AM 07/04/2021    8:53 AM 12/21/2020    9:57 AM 11/02/2020   10:59 AM 08/11/2020    1:37 PM  Depression screen PHQ 2/9  Decreased Interest 0 0 0 0 0  Down, Depressed, Hopeless 0 0 0 0 0  PHQ - 2 Score 0 0 0 0 0       No data to display                09/14/2021    9:42 AM 08/17/2021    9:35 AM 07/04/2021    8:53 AM 12/21/2020    9:57 AM 11/02/2020   10:59 AM  Fall Risk   Falls in the past year? 0 0 0 0 0  Number falls in past yr: 0 0 0 0 0  Injury with Fall? 0 0 0 0 0  Risk for fall due to :  Impaired vision No Fall Risks No Fall Risks   Follow up Falls evaluation completed Falls prevention discussed Falls evaluation completed     Immunization History  Administered Date(s) Administered   Fluad Quad(high Dose 65+) 04/28/2019, 05/10/2020, 07/04/2021, 05/08/2022   Influenza Split 05/07/2012   Influenza, High Dose Seasonal PF 05/03/2016, 07/02/2017, 05/20/2018   Influenza,inj,Quad PF,6+ Mos 09/22/2013, 05/25/2014, 05/31/2015   PFIZER(Purple Top)SARS-COV-2 Vaccination 10/05/2019, 10/29/2019   Pneumococcal Conjugate-13 09/28/2014   Pneumococcal Polysaccharide-23 10/05/2011   Tdap 03/24/2014   Zoster Recombinat (Shingrix) 05/30/2018, 09/17/2018  Past Medical History:  Diagnosis Date   Anemia    Arthritis    Cervical radiculopathy 12/22/2019   Colonic polyp    Concussion 03/05/2018   Coronary artery disease    stents x 3 (03-2014)   Diabetes mellitus    Diverticulosis    throughout entire colon   History of total right knee replacement 07/09/2018   HTN (hypertension)    Hypertriglyceridemia    Ingrown toenail 10/21/2019   Kidney stone    Left hamstring injury 11/02/2020   Lymphocytosis    Metatarsal fracture left   left 5th proximal phalanx    Neck pain 02/19/2018   OSA (obstructive sleep apnea)    "Mild" by Taylor   PONV (postoperative nausea and vomiting)     first time, no problem since then   RBBB 01/2016   Right inguinal hernia 02/03/2016   ? Noted 02/2015 sent to surgery    Splenic marginal zone b-cell lymphoma ([place]) 12/18/2011   Splenomegaly    STEMI (ST elevation myocardial infarction) [facility])    August 2015   Subarachnoid hematoma ([place]) 03/12/2018   Subarachnoid hemorrhage ([place]) 03/05/2018   No Known Allergies Past Surgical History:  Procedure Laterality Date   CARDIAC CATHETERIZATION     stents x 3 (03/2014)   CHOLECYSTECTOMY     INGUINAL HERNIA REPAIR Right 05/27/2015   Procedure: LAPAROSCOPIC REPAIR RIGHT  INGUINAL HERNIA;  Surgeon: Greer Pickerel, MD;  Location: [facility];  Service: General;  Laterality: Right;   INSERTION OF MESH Right 05/27/2015   Procedure: INSERTION OF MESH;  Surgeon: Greer Pickerel, MD;  Location: [facility];  Service: General;  Laterality: Right;   LUMBAR DISC SURGERY     TONSILLECTOMY AND ADENOIDECTOMY     TOTAL KNEE ARTHROPLASTY Right 07/09/2018   Procedure: RIGHT TOTAL KNEE ARTHROPLASTY;  Surgeon: Marcus Balding, MD;  Location: [place];  Service: Orthopedics;  Laterality: Right;   Family History  Problem Relation Age of Onset   Kidney disease Sister    Colon cancer Sister 55   Colon cancer Brother    Bone cancer Brother    Arthritis Mother    Breast cancer Mother    Heart disease Mother    Early death Father    Arthritis Son    Esophageal cancer Sister    Bladder Cancer Sister    Rectal cancer Neg Hx    Stomach cancer Neg Hx    Social History   Social History Narrative   Married to Marcus Taylor.    HS education. Retired.    Former smoker. No drugs or ETOH.   Wears seatbelt.   Wears dentures.   Smoke detector in the home. Firearms  In the home.    Feels safe in his relationships.     Allergies as of 05/26/2022   No Known Allergies      Medication List        Accurate as of May 26, 2022 11:59 PM. If you have any questions, ask your nurse or doctor.          STOP taking these medications     metFORMIN 500 MG 24 hr tablet Commonly known as: GLUCOPHAGE-XR Stopped by: Marcus Pouch, Taylor   traMADol 50 MG tablet Commonly known as: ULTRAM Stopped by: Marcus Pouch, Taylor       TAKE these medications    amLODipine 5 MG tablet Commonly known as: NORVASC Take 1 tablet (5 mg total) by mouth daily.   aspirin EC 81 MG tablet  Take 81 mg by mouth daily.   atorvastatin 40 MG tablet Commonly known as: LIPITOR Take 1 tablet (40 mg total) by mouth daily.   diclofenac Sodium 1 % Gel Commonly known as: VOLTAREN APPLY 2 GRAMS TO AFFECTED AREA 4 TIMES A DAY   esomeprazole 20 MG capsule Commonly known as: NEXIUM Take 20 mg by mouth every morning.   ferrous sulfate 324 MG Tbec Take 324 mg by mouth daily.   furosemide 20 MG tablet Commonly known as: LASIX Take 0.5 tablets (10 mg total) by mouth daily as needed.   gabapentin 100 MG capsule Commonly known as: NEURONTIN Take 1-3 capsules (100-300 mg total) by mouth at bedtime.   lisinopril 40 MG tablet Commonly known as: ZESTRIL Take 1 tablet (40 mg total) by mouth daily.   metoprolol tartrate 25 MG tablet Commonly known as: LOPRESSOR Take 0.5 tablets (12.5 mg total) by mouth 2 (two) times daily.   nitroGLYCERIN 0.4 MG SL tablet Commonly known as: NITROSTAT Place 1 tablet (0.4 mg total) under the tongue every 5 (five) minutes as needed for chest pain.       All past medical history, surgical history, allergies, family history, immunizations andmedications were updated in the EMR today and reviewed under the history and medication portions of their EMR.       ROS 14 pt review of systems performed and negative (unless mentioned in an HPI)  Objective: BP 126/67   Pulse 69   Temp (!) 97.4 F (36.3 C)   Wt 171 lb 6.4 oz (77.7 kg)   SpO2 97%   BMI 26.06 kg/m  Physical Exam Vitals and nursing note reviewed.  Constitutional:      General: He is not in acute distress.    Appearance: Normal appearance. He is not  ill-appearing, toxic-appearing or diaphoretic.  HENT:     Head: Normocephalic and atraumatic.  Eyes:     General: No scleral icterus.       Right eye: No discharge.        Left eye: No discharge.     Extraocular Movements: Extraocular movements intact.     Pupils: Pupils are equal, round, and reactive to light.  Cardiovascular:     Rate and Rhythm: Normal rate and regular rhythm.  Pulmonary:     Effort: Pulmonary effort is normal. No respiratory distress.     Breath sounds: Normal breath sounds. No wheezing, rhonchi or rales.  Musculoskeletal:     Cervical back: Neck supple.     Right lower leg: No edema.     Left lower leg: No edema.  Lymphadenopathy:     Cervical: No cervical adenopathy.  Skin:    General: Skin is warm and dry.     Coloration: Skin is not jaundiced or pale.     Findings: No rash.  Neurological:     Mental Status: He is alert and oriented to person, place, and time. Mental status is at baseline.  Psychiatric:        Mood and Affect: Mood normal.        Behavior: Behavior normal.        Thought Content: Thought content normal.        Judgment: Judgment normal.     No results found.  Assessment/plan: Marcus Taylor is a 81 y.o. male present for [facility] Iron deficiency anemia:  Follows with hematology   Controlled type 2 diabetes mellitus with complication, without long-term current use of insulin (HCC) Good to discontinue metformin  today since he was only taking low-dose 3 times a week and his A1c is 5.3. A1c: 6.2 --> 6.0>5.7>> 5.9>5.8>6.0> 5.9> 5.3> 5.4> 5.3  today  - Would not advise much more weight loss . - Opthalmology: 11/2021 [place] Dr. [name].  - pna series UTD/completed  - flu shot up-to-date 2023 - foot exam completed 05/26/2022 - urine micro collected UTD   Essential hypertension/Coronary artery disease involving native coronary artery of native heart without angina pectoris/History of ST elevation myocardial infarction (STEMI) Stable -  Follows yearly with cardiology.  Continue lopressor 12.5 twice daily Continue lisinopril 40 mg daily Continue amlodipine 5 mg qd Continue statin  - Low sodium diet encouraged. - F/U q 5.5  months   Splenic Margicnal Zone B-cell lymphoma/thrombocytopenia/anemia of chronic disease/pancytopenia/lymphocytosis: - continue to  follow up with heme/onc.  - cbc - baseline 29> following with heme. No need to call critical lab (also written on lab order) Thrombocytopenia ([place]) Cbc UTD B12 deficiency - B12 UTD  Right hand pain/numbness tingling right hand: Patient has numbness tingling in the fingertips in the distribution of the median nerve of his right hand.  He states it is getting more difficult to handle smaller objects since he is right-handed. He is interested in surgical consult for possible carpal tunnel treatment. Referral to hand surgery placed for eval  Return in about 24 weeks (around 11/10/2022).   Orders Placed This Encounter  Procedures   Ambulatory referral to Hand Surgery   POCT glycosylated hemoglobin (Hb A1C)   Meds ordered this encounter  Medications   amLODipine (NORVASC) 5 MG tablet    Sig: Take 1 tablet (5 mg total) by mouth daily.    Dispense:  90 tablet    Refill:  1   atorvastatin (LIPITOR) 40 MG tablet    Sig: Take 1 tablet (40 mg total) by mouth daily.    Dispense:  90 tablet    Refill:  3    DC prior scripts for lipitor   furosemide (LASIX) 20 MG tablet    Sig: Take 0.5 tablets (10 mg total) by mouth daily as needed.    Dispense:  45 tablet    Refill:  0   lisinopril (ZESTRIL) 40 MG tablet    Sig: Take 1 tablet (40 mg total) by mouth daily.    Dispense:  90 tablet    Refill:  1   metoprolol tartrate (LOPRESSOR) 25 MG tablet    Sig: Take 0.5 tablets (12.5 mg total) by mouth 2 (two) times daily.    Dispense:  90 tablet    Refill:  1   Referral Orders         Ambulatory referral to Hand Surgery       Note is dictated utilizing voice recognition  software. Although note has been proof read prior to signing, occasional typographical errors still can be missed. If any questions arise, please Taylor not hesitate to call for verification.  Electronically signed by: Marcus Pouch, [place]

## 2022-06-09 ENCOUNTER — Ambulatory Visit (INDEPENDENT_AMBULATORY_CARE_PROVIDER_SITE_OTHER): Payer: Medicare Other

## 2022-06-09 DIAGNOSIS — E538 Deficiency of other specified B group vitamins: Secondary | ICD-10-CM | POA: Diagnosis not present

## 2022-06-09 MED ORDER — CYANOCOBALAMIN 1000 MCG/ML IJ SOLN
1000.0000 ug | Freq: Once | INTRAMUSCULAR | Status: AC
  Administered 2022-06-09: 1000 ug via INTRAMUSCULAR

## 2022-06-09 NOTE — Progress Notes (Signed)
Pt here for monthly B12 injection per Dr.Kuneff   B12 1000mcg given IM left deltoid , and pt tolerated injection well.   Next B12 injection scheduled for 1 month.     

## 2022-06-14 DIAGNOSIS — G5601 Carpal tunnel syndrome, right upper limb: Secondary | ICD-10-CM | POA: Diagnosis not present

## 2022-06-30 ENCOUNTER — Ambulatory Visit: Payer: Medicare Other | Admitting: Podiatry

## 2022-06-30 DIAGNOSIS — M79676 Pain in unspecified toe(s): Secondary | ICD-10-CM | POA: Diagnosis not present

## 2022-06-30 DIAGNOSIS — D689 Coagulation defect, unspecified: Secondary | ICD-10-CM | POA: Diagnosis not present

## 2022-06-30 DIAGNOSIS — B351 Tinea unguium: Secondary | ICD-10-CM

## 2022-07-01 NOTE — Progress Notes (Signed)
Subjective: 81 y.o. returns the office today for painful, elongated, thickened toenails which he cannot trim himself. Denies any redness or drainage around the nails.  No new concerns.  No open lesions.  PCP: [name], DO Last seen May 26, 2022 Last A1c: 5.3 on May 26, 2022  Objective: AAO 3, NAD DP/PT pulses palpable, CRT less than 3 seconds  Nails hypertrophic, dystrophic, elongated, brittle, discolored 10. There is tenderness overlying the nails 1-5 bilaterally. There is no surrounding erythema or drainage along the nail sites. No pain with calf compression, swelling, warmth, erythema.  Assessment: Patient presents with symptomatic onychomycosis  Plan: -Treatment options including alternatives, risks, complications were discussed -Nails sharply debrided 10 without complication/bleeding. -Discussed daily foot inspection. If there are any changes, to call the office immediately.  -Follow-up in 3 months or sooner if any problems are to arise. In the meantime, encouraged to call the office with any questions, concerns, changes symptoms.  Celesta Gentile, DPM

## 2022-07-03 DIAGNOSIS — G5601 Carpal tunnel syndrome, right upper limb: Secondary | ICD-10-CM | POA: Diagnosis not present

## 2022-07-10 ENCOUNTER — Ambulatory Visit: Payer: Medicare Other

## 2022-07-10 DIAGNOSIS — G5601 Carpal tunnel syndrome, right upper limb: Secondary | ICD-10-CM | POA: Diagnosis not present

## 2022-07-11 ENCOUNTER — Ambulatory Visit (INDEPENDENT_AMBULATORY_CARE_PROVIDER_SITE_OTHER): Payer: Medicare Other

## 2022-07-11 DIAGNOSIS — E538 Deficiency of other specified B group vitamins: Secondary | ICD-10-CM

## 2022-07-11 MED ORDER — CYANOCOBALAMIN 1000 MCG/ML IJ SOLN
1000.0000 ug | Freq: Once | INTRAMUSCULAR | Status: AC
  Administered 2022-07-11: 1000 ug via INTRAMUSCULAR

## 2022-07-11 NOTE — Progress Notes (Signed)
Pt here for monthly B12 injection per Dr.Kuneff   B12 1000mcg given IM left deltoid , and pt tolerated injection well.   Next B12 injection scheduled for 1 month.     

## 2022-07-19 DIAGNOSIS — G5601 Carpal tunnel syndrome, right upper limb: Secondary | ICD-10-CM | POA: Diagnosis not present

## 2022-08-08 ENCOUNTER — Ambulatory Visit (INDEPENDENT_AMBULATORY_CARE_PROVIDER_SITE_OTHER): Payer: Medicare Other

## 2022-08-08 DIAGNOSIS — E538 Deficiency of other specified B group vitamins: Secondary | ICD-10-CM

## 2022-08-08 MED ORDER — CYANOCOBALAMIN 1000 MCG/ML IJ SOLN
1000.0000 ug | Freq: Once | INTRAMUSCULAR | Status: AC
  Administered 2022-08-08: 1000 ug via INTRAMUSCULAR

## 2022-08-08 NOTE — Progress Notes (Signed)
Pt here for monthly B12 injection per Dr.Kuneff  B12 1072mg given IM, and pt tolerated injection well.  Next B12 injection scheduled for 09/05/22   Please sign off in PCP absence.

## 2022-08-23 ENCOUNTER — Ambulatory Visit (INDEPENDENT_AMBULATORY_CARE_PROVIDER_SITE_OTHER): Payer: Medicare Other

## 2022-08-23 VITALS — BP 139/74 | HR 67 | Temp 98.1°F | Wt 174.6 lb

## 2022-08-23 DIAGNOSIS — Z0000 Encounter for general adult medical examination without abnormal findings: Secondary | ICD-10-CM

## 2022-08-23 NOTE — Patient Instructions (Signed)
Health Maintenance, Male Adopting a healthy lifestyle and getting preventive care are important in promoting health and wellness. Ask your health care provider about: The right schedule for you to have regular tests and exams. Things you can do on your own to prevent diseases and keep yourself healthy. What should I know about diet, weight, and exercise? Eat a healthy diet  Eat a diet that includes plenty of vegetables, fruits, low-fat dairy products, and lean protein. Do not eat a lot of foods that are high in solid fats, added sugars, or sodium. Maintain a healthy weight Body mass index (BMI) is a measurement that can be used to identify possible weight problems. It estimates body fat based on height and weight. Your health care provider can help determine your BMI and help you achieve or maintain a healthy weight. Get regular exercise Get regular exercise. This is one of the most important things you can do for your health. Most adults should: Exercise for at least 150 minutes each week. The exercise should increase your heart rate and make you sweat (moderate-intensity exercise). Do strengthening exercises at least twice a week. This is in addition to the moderate-intensity exercise. Spend less time sitting. Even light physical activity can be beneficial. Watch cholesterol and blood lipids Have your blood tested for lipids and cholesterol at 82 years of age, then have this test every 5 years. You may need to have your cholesterol levels checked more often if: Your lipid or cholesterol levels are high. You are older than 82 years of age. You are at high risk for heart disease. What should I know about cancer screening? Many types of cancers can be detected early and may often be prevented. Depending on your health history and family history, you may need to have cancer screening at various ages. This may include screening for: Colorectal cancer. Prostate cancer. Skin cancer. Lung  cancer. What should I know about heart disease, diabetes, and high blood pressure? Blood pressure and heart disease High blood pressure causes heart disease and increases the risk of stroke. This is more likely to develop in people who have high blood pressure readings or are overweight. Talk with your health care provider about your target blood pressure readings. Have your blood pressure checked: Every 3-5 years if you are 18-39 years of age. Every year if you are 40 years old or older. If you are between the ages of 65 and 75 and are a current or former smoker, ask your health care provider if you should have a one-time screening for abdominal aortic aneurysm (AAA). Diabetes Have regular diabetes screenings. This checks your fasting blood sugar level. Have the screening done: Once every three years after age 45 if you are at a normal weight and have a low risk for diabetes. More often and at a younger age if you are overweight or have a high risk for diabetes. What should I know about preventing infection? Hepatitis B If you have a higher risk for hepatitis B, you should be screened for this virus. Talk with your health care provider to find out if you are at risk for hepatitis B infection. Hepatitis C Blood testing is recommended for: Everyone born from 1945 through 1965. Anyone with known risk factors for hepatitis C. Sexually transmitted infections (STIs) You should be screened each year for STIs, including gonorrhea and chlamydia, if: You are sexually active and are younger than 82 years of age. You are older than 82 years of age and your   health care provider tells you that you are at risk for this type of infection. Your sexual activity has changed since you were last screened, and you are at increased risk for chlamydia or gonorrhea. Ask your health care provider if you are at risk. Ask your health care provider about whether you are at high risk for HIV. Your health care provider  may recommend a prescription medicine to help prevent HIV infection. If you choose to take medicine to prevent HIV, you should first get tested for HIV. You should then be tested every 3 months for as long as you are taking the medicine. Follow these instructions at home: Alcohol use Do not drink alcohol if your health care provider tells you not to drink. If you drink alcohol: Limit how much you have to 0-2 drinks a day. Know how much alcohol is in your drink. In the [place] one drink equals one 12 oz bottle of beer (355 mL), one 5 oz glass of wine (148 mL), or one 1 oz glass of hard liquor (44 mL). Lifestyle Do not use any products that contain nicotine or tobacco. These products include cigarettes, chewing tobacco, and vaping devices, such as e-cigarettes. If you need help quitting, ask your health care provider. Do not use street drugs. Do not share needles. Ask your health care provider for help if you need support or information about quitting drugs. General instructions Schedule regular health, dental, and eye exams. Stay current with your vaccines. Tell your health care provider if: You often feel depressed. You have ever been abused or do not feel safe at home. Summary Adopting a healthy lifestyle and getting preventive care are important in promoting health and wellness. Follow your health care provider's instructions about healthy diet, exercising, and getting tested or screened for diseases. Follow your health care provider's instructions on monitoring your cholesterol and blood pressure. This information is not intended to replace advice given to you by your health care provider. Make sure you discuss any questions you have with your health care provider. Document Revised: 12/20/2020 Document Reviewed: 12/20/2020 Elsevier Patient Education  2023 [employer]  

## 2022-08-23 NOTE — Progress Notes (Signed)
Subjective:   Marcus Taylor is a 82 y.o. male who presents for Medicare Annual/Subsequent preventive examination.  Review of Systems    Defer to PCP Cardiac Risk Factors include: advanced age (>62mn, >>53women);diabetes mellitus;hypertension;male gender     Objective:    Today's Vitals   08/23/22 0801  BP: 139/74  Pulse: 67  Temp: 98.1 F (36.7 C)  SpO2: 99%  Weight: 174 lb 9.6 oz (79.2 kg)   Body mass index is 26.55 kg/m.     08/23/2022    8:05 AM 08/17/2021    9:34 AM 08/11/2020    1:35 PM 01/09/2020    8:16 AM 01/02/2020    8:16 AM 09/11/2019    2:48 PM 07/09/2018    8:56 AM  Advanced Directives  Does Patient Have a Medical Advance Directive? Yes Yes Yes Yes Yes Yes No  Type of AParamedicof [place]uncieLiving will Healthcare Power of [place]rockmortonLiving will HR[place]ierLiving will [place]tow[place]Living will [place]Living will   Does patient want to make changes to medical advance directive? No - Patient declined        Copy of [place]in Chart? No - copy requested No - copy requested No - copy requested No - copy requested No - copy requested No - copy requested   Would patient like information on creating a medical advance directive?       No - Patient declined    Current Medications (verified) Outpatient Encounter Medications as of 08/23/2022  Medication Sig   amLODipine (NORVASC) 5 MG tablet Take 1 tablet (5 mg total) by mouth daily.   aspirin EC 81 MG tablet Take 81 mg by mouth daily.   atorvastatin (LIPITOR) 40 MG tablet Take 1 tablet (40 mg total) by mouth daily.   diclofenac Sodium (VOLTAREN) 1 % GEL APPLY 2 GRAMS TO AFFECTED AREA 4 TIMES A DAY   esomeprazole (NEXIUM) 20 MG capsule Take 20 mg by mouth every morning.   ferrous sulfate 324 MG TBEC Take 324 mg by mouth daily.   furosemide (LASIX) 20 MG tablet Take 0.5 tablets (10 mg total)  by mouth daily as needed.   gabapentin (NEURONTIN) 100 MG capsule Take 1-3 capsules (100-300 mg total) by mouth at bedtime.   lisinopril (ZESTRIL) 40 MG tablet Take 1 tablet (40 mg total) by mouth daily.   metoprolol tartrate (LOPRESSOR) 25 MG tablet Take 0.5 tablets (12.5 mg total) by mouth 2 (two) times daily.   nitroGLYCERIN (NITROSTAT) 0.4 MG SL tablet Place 1 tablet (0.4 mg total) under the tongue every 5 (five) minutes as needed for chest pain.   No facility-administered encounter medications on file as of 08/23/2022.    Allergies (verified) Patient has no known allergies.   History: Past Medical History:  Diagnosis Date   Anemia    Arthritis    Cervical radiculopathy 12/22/2019   Colonic polyp    Concussion 03/05/2018   Coronary artery disease    stents x 3 (03-2014)   Diabetes mellitus    Diverticulosis    throughout entire colon   History of total right knee replacement 07/09/2018   HTN (hypertension)    Hypertriglyceridemia    Ingrown toenail 10/21/2019   Kidney stone    Left hamstring injury 11/02/2020   Lymphocytosis    Metatarsal fracture left   left 5th proximal phalanx    Neck pain 02/19/2018   OSA (obstructive  sleep apnea)    "Mild" by records   PONV (postoperative nausea and vomiting)    first time, no problem since then   RBBB 01/2016   Right inguinal hernia 02/03/2016   ? Noted 02/2015 sent to surgery    Splenic marginal zone b-cell lymphoma ([place]) 12/18/2011   Splenomegaly    STEMI (ST elevation myocardial infarction) [facility])    August 2015   Subarachnoid hematoma ([place]) 03/12/2018   Subarachnoid hemorrhage ([place]) 03/05/2018   Past Surgical History:  Procedure Laterality Date   CARDIAC CATHETERIZATION     stents x 3 (03/2014)   CHOLECYSTECTOMY     INGUINAL HERNIA REPAIR Right 05/27/2015   Procedure: LAPAROSCOPIC REPAIR RIGHT  INGUINAL HERNIA;  Surgeon: Greer Pickerel, MD;  Location: [facility];  Service: General;  Laterality: Right;   INSERTION OF MESH Right  05/27/2015   Procedure: INSERTION OF MESH;  Surgeon: Greer Pickerel, MD;  Location: [facility];  Service: General;  Laterality: Right;   LUMBAR DISC SURGERY     TONSILLECTOMY AND ADENOIDECTOMY     TOTAL KNEE ARTHROPLASTY Right 07/09/2018   Procedure: RIGHT TOTAL KNEE ARTHROPLASTY;  Surgeon: Garald Balding, MD;  Location: [place];  Service: Orthopedics;  Laterality: Right;   Family History  Problem Relation Age of Onset   Kidney disease Sister    Colon cancer Sister 44   Colon cancer Brother    Bone cancer Brother    Arthritis Mother    Breast cancer Mother    Heart disease Mother    Early death Father    Arthritis Son    Esophageal cancer Sister    Bladder Cancer Sister    Rectal cancer Neg Hx    Stomach cancer Neg Hx    Social History   Socioeconomic History   Marital status: Married    Spouse name: Not on file   Number of children: 2   Years of education: Not on file   Highest education level: Not on file  Occupational History   Occupation: retired    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    HS education. Retired.    Former smoker. No drugs or ETOH.   Wears seatbelt.   Wears dentures.   Smoke detector in the home. Firearms  In the home.    Feels safe in his relationships.    Social Determinants of Health   Financial Resource Strain: Low Risk  (08/23/2022)   Overall Financial Resource Strain (CARDIA)    Difficulty of Paying Living Expenses: Not hard at all  Food Insecurity: No Food Insecurity (08/23/2022)    Hunger Vital Sign    Worried About Running Out of Food in the Last Year: Never true    Ran Out of Food in the Last Year: Never true  Transportation Needs: No Transportation Needs (08/23/2022)   PRAPARE - Hydrologist (  Medical): No    Lack of Transportation (Non-Medical): No  Physical Activity: Insufficiently Active (08/23/2022)   Exercise Vital Sign    Days of Exercise per Week: 5 days    Minutes of Exercise per Session: 20 min  Stress: No Stress Concern Present (08/23/2022)   [place]    Feeling of Stress : Not at all  Social Connections: Moderately Integrated (08/23/2022)   Social Connection and Isolation Panel [NHANES]    Frequency of Communication with Friends and Family: More than three times a week    Frequency of Social Gatherings with Friends and Family: Three times a week    Attends Religious Services: More than 4 times per year    Active Member of Clubs or Organizations: No    Attends Archivist Meetings: Never    Marital Status: Married    Tobacco Counseling Counseling given: Not Answered   Clinical Intake:  Pre-visit preparation completed: No  Pain : No/denies pain     Nutritional Risks: None Diabetes: No  How often do you need to have someone help you when you read instructions, pamphlets, or other written materials from your doctor or pharmacy?: 1 - Never What is the last grade level you completed in school?: 12th  Diabetic?no         Activities of Daily Living    08/23/2022    8:02 AM  In your present state of health, do you have any difficulty performing the following activities:  Hearing? 1  Vision? 0  Difficulty concentrating or making decisions? 0  Walking or climbing stairs? 0  Dressing or bathing? 0  Doing errands, shopping? 0  Preparing Food and eating ? N  Using the Toilet? N  In the past six months, have you accidently leaked  urine? N  Do you have problems with loss of bowel control? N  Managing your Medications? N  Managing your Finances? N  Housekeeping or managing your Housekeeping? N    Patient Care Team: [name], DO as PCP - General (Family Medicine) [name], MD (Inactive) as Consulting Physician (Gastroenterology) [name], MD as Consulting Physician (Hematology and Oncology) [name], MD as Referring Physician (Cardiology) [name], MD as Consulting Physician (Neurosurgery) [name], OD as Referring Physician Garald Balding, MD as Consulting Physician (Orthopedic Surgery) Trula Slade, DPM as Consulting Physician (Podiatry)  Indicate any recent Medical Services you may have received from other than [facility] providers in the past year (date may be approximate).     Assessment:   This is a routine wellness examination for Marcus Taylor.  Hearing/Vision screen No results found.  Dietary issues and exercise activities discussed: Current Exercise Habits: Home exercise routine, Type of exercise: walking, Time (Minutes): 20, Frequency (Times/Week): 5, Weekly Exercise (Minutes/Week): 100, Exercise limited by: None identified   Goals Addressed   None   Depression Screen    08/23/2022    8:02 AM 08/17/2021    9:33 AM 07/04/2021    8:53 AM 12/21/2020    9:57 AM 11/02/2020   10:59 AM 08/11/2020    1:37 PM 06/04/2020    8:53 AM  PHQ 2/9 Scores  PHQ - 2 Score 0 0 0 0 0 0 0    Fall Risk    08/23/2022    8:02 AM 09/14/2021    9:42 AM 08/17/2021    9:35 AM 07/04/2021    8:53 AM 12/21/2020    9:57 AM  Fall Risk   Falls in the past year? 0 0 0 0 0  Number falls in past yr: 0 0 0 0 0  Injury with Fall? 0 0 0 0 0  Risk for fall due to :   Impaired vision No Fall Risks No Fall Risks  Follow up Falls evaluation completed Falls evaluation completed Falls prevention discussed Falls evaluation completed     FALL RISK PREVENTION PERTAINING TO THE HOME:  Any stairs in or  around the home? No  If so, are there any without handrails? No  Home free of loose throw rugs in walkways, pet beds, electrical cords, etc? Yes  Adequate lighting in your home to reduce risk of falls? Yes   ASSISTIVE DEVICES UTILIZED TO PREVENT FALLS:  Life alert? No  Use of a cane, walker or w/c? No  Grab bars in the bathroom? No  Shower chair or bench in shower? No  Elevated toilet seat or a handicapped toilet? No   TIMED UP AND GO:  Was the test performed? Yes .  Length of time to ambulate 10 feet: 6 sec.   Gait steady and fast without use of assistive device  Cognitive Function:    06/14/2018    8:45 AM  MMSE - Mini Mental State Exam  Orientation to time 5  Orientation to Place 5  Registration 3  Attention/ Calculation 5  Recall 0  Language- name 2 objects 2  Language- repeat 1  Language- follow 3 step command 3  Language- read & follow direction 1  Write a sentence 1  Copy design 1  Total score 27        08/23/2022    8:05 AM 08/17/2021    9:37 AM  6CIT Screen  What Year? 0 points 0 points  What month? 0 points 0 points  What time? 0 points   Count back from 20 0 points 0 points  Months in reverse 0 points 0 points  Repeat phrase 6 points 6 points  Total Score 6 points     Immunizations Immunization History  Administered Date(s) Administered   Fluad Quad(high Dose 65+) 04/28/2019, 05/10/2020, 07/04/2021, 05/08/2022   Influenza Split 05/07/2012   Influenza, High Dose Seasonal PF 05/03/2016, 07/02/2017, 05/20/2018   Influenza,inj,Quad PF,6+ Mos 09/22/2013, 05/25/2014, 05/31/2015   PFIZER(Purple Top)SARS-COV-2 Vaccination 10/05/2019, 10/29/2019   Pneumococcal Conjugate-13 09/28/2014   Pneumococcal Polysaccharide-23 10/05/2011   Tdap 03/24/2014   Zoster Recombinat (Shingrix) 05/30/2018, 09/17/2018    TDAP status: Up to date  Flu Vaccine status: Up to date  Pneumococcal vaccine status: Up to date  Covid-19 vaccine status: Completed  vaccines  Qualifies for Shingles Vaccine? Yes   Zostavax completed No   Shingrix Completed?: Yes  Screening Tests Health Maintenance  Topic Date Due   HEMOGLOBIN A1C  11/25/2022   OPHTHALMOLOGY EXAM  12/06/2022   Diabetic kidney evaluation - Urine ACR  12/22/2022   FOOT EXAM  12/31/2022   Diabetic kidney evaluation - eGFR measurement  03/14/2023   Medicare Annual Wellness (AWV)  08/24/2023   DTaP/Tdap/Td (2 - Td or Tdap) 03/24/2024   Pneumonia Vaccine 4+ Years old  Completed   INFLUENZA VACCINE  Completed   Zoster Vaccines- Shingrix  Completed   HPV VACCINES  Aged Out   COLONOSCOPY (Pts 45-32yr Insurance coverage will need to be confirmed)  Discontinued   COVID-19 Vaccine  Discontinued    Health Maintenance  There are no preventive care reminders to display for this patient.   Colorectal cancer  screening: No longer required.   Lung Cancer Screening: (Low Dose CT Chest recommended if Age 62-80 years, 30 pack-year currently smoking OR have quit w/in 15years.) does not qualify.   Lung Cancer Screening Referral: n/a  Additional Screening:  Hepatitis C Screening: does not qualify; Completed n/a  Vision Screening: Recommended annual ophthalmology exams for early detection of glaucoma and other disorders of the eye. Is the patient up to date with their annual eye exam?  No  Who is the provider or what is the name of the office in which the patient attends annual eye exams? Dr. Blair Heys If pt is not established with a provider, would they like to be referred to a provider to establish care? No .   Dental Screening: Recommended annual dental exams for proper oral hygiene  Community Resource Referral / Chronic Care Management: CRR required this visit?  No   CCM required this visit?  No      Plan:     I have personally reviewed and noted the following in the patient's chart:   Medical and social history Use of alcohol, tobacco or illicit drugs  Current  medications and supplements including opioid prescriptions. Patient is not currently taking opioid prescriptions. Functional ability and status Nutritional status Physical activity Advanced directives List of other physicians Hospitalizations, surgeries, and ER visits in previous 12 months Vitals Screenings to include cognitive, depression, and falls Referrals and appointments  In addition, I have reviewed and discussed with patient certain preventive protocols, quality metrics, and best practice recommendations. A written personalized care plan for preventive services as well as general preventive health recommendations were provided to patient.     Beatrix Fetters, CMA   08/23/2022   Nurse Notes: Non-Face to Face or Face to Face 15 minute visit Encounter   Marcus Taylor , Thank you for taking time to come for your Medicare Wellness Visit. I appreciate your ongoing commitment to your health goals. Please review the following plan we discussed and let me know if I can assist you in the future.   These are the goals we discussed:  Goals      Patient Stated     Maintain current health     Patient Stated     None at this time        This is a list of the screening recommended for you and due dates:  Health Maintenance  Topic Date Due   Hemoglobin A1C  11/25/2022   Eye exam for diabetics  12/06/2022   Yearly kidney health urinalysis for diabetes  12/22/2022   Complete foot exam   12/31/2022   Yearly kidney function blood test for diabetes  03/14/2023   Medicare Annual Wellness Visit  08/24/2023   DTaP/Tdap/Td vaccine (2 - Td or Tdap) 03/24/2024   Pneumonia Vaccine  Completed   Flu Shot  Completed   Zoster (Shingles) Vaccine  Completed   HPV Vaccine  Aged Out   Colon Cancer Screening  Discontinued   COVID-19 Vaccine  Discontinued

## 2022-09-05 ENCOUNTER — Ambulatory Visit (INDEPENDENT_AMBULATORY_CARE_PROVIDER_SITE_OTHER): Payer: Medicare Other

## 2022-09-05 DIAGNOSIS — E538 Deficiency of other specified B group vitamins: Secondary | ICD-10-CM

## 2022-09-05 MED ORDER — CYANOCOBALAMIN 1000 MCG/ML IJ SOLN
1000.0000 ug | Freq: Once | INTRAMUSCULAR | Status: AC
  Administered 2022-09-05: 1000 ug via INTRAMUSCULAR

## 2022-09-05 NOTE — Progress Notes (Signed)
Pt here for monthly B12 injection per Dr.Kuneff   B12 1000mcg given IM left deltoid , and pt tolerated injection well.   Next B12 injection scheduled for 1 month.     

## 2022-09-08 ENCOUNTER — Ambulatory Visit (INDEPENDENT_AMBULATORY_CARE_PROVIDER_SITE_OTHER): Payer: Medicare Other | Admitting: Family Medicine

## 2022-09-08 ENCOUNTER — Encounter: Payer: Self-pay | Admitting: Family Medicine

## 2022-09-08 VITALS — BP 144/65 | HR 52 | Temp 98.2°F | Wt 171.0 lb

## 2022-09-08 DIAGNOSIS — M5431 Sciatica, right side: Secondary | ICD-10-CM

## 2022-09-08 MED ORDER — CYCLOBENZAPRINE HCL 5 MG PO TABS: 5.0000 mg | ORAL_TABLET | Freq: Two times a day (BID) | ORAL | 0 refills | Status: DC | PRN

## 2022-09-08 MED ORDER — PREDNISONE 20 MG PO TABS: ORAL_TABLET | ORAL | 0 refills | Status: DC

## 2022-09-08 NOTE — Progress Notes (Signed)
Marcus Taylor , 09/05/1940, 82 y.o., male MRN: [id number] Patient Care Team    Relationship Specialty Notifications Start End  [name], DO PCP - General Family Medicine  01/24/22   [name], MD (Inactive) Consulting Physician Gastroenterology  01/26/16   [name], MD Consulting Physician Hematology and Oncology  01/26/16   [name], MD Referring Physician Cardiology  01/26/16   [name], MD Consulting Physician Neurosurgery  01/27/16   [name], OD Referring Physician   05/03/16    Comment: opthalmology   Garald Balding, MD Consulting Physician Orthopedic Surgery  06/14/18   Trula Slade, DPM Consulting Physician Podiatry  06/14/18     Chief Complaint  Patient presents with   Back Pain    Pt c/o of pain on the right back/hip area. States it just started hurting w/o injury     Subjective: Pt presents for an OV with complaints of right sided buttock pain of 5 days duration. He states it is dull ache type of pain. It does not radiate. Its worse when laying down and when walking. Better if sitting. Has not had pain in this location prior. No rash, fever or skin changes noted. No injury or over activity he can recall.      08/23/2022    8:02 AM 08/17/2021    9:33 AM 07/04/2021    8:53 AM 12/21/2020    9:57 AM 11/02/2020   10:59 AM  Depression screen PHQ 2/9  Decreased Interest 0 0 0 0 0  Down, Depressed, Hopeless 0 0 0 0 0  PHQ - 2 Score 0 0 0 0 0    No Known Allergies Social History   Social History Narrative   Married to Marcus Taylor.    HS education. Retired.    Former smoker. No drugs or ETOH.   Wears seatbelt.   Wears dentures.   Smoke detector in the home. Firearms  In the home.    Feels safe in his relationships.    Past Medical History:  Diagnosis Date   Anemia    Arthritis    Cervical radiculopathy 12/22/2019   Colonic polyp    Concussion 03/05/2018   Coronary artery disease    stents x 3 (03-2014)   Diabetes mellitus     Diverticulosis    throughout entire colon   History of total right knee replacement 07/09/2018   HTN (hypertension)    Hypertriglyceridemia    Ingrown toenail 10/21/2019   Kidney stone    Left hamstring injury 11/02/2020   Lymphocytosis    Metatarsal fracture left   left 5th proximal phalanx    Neck pain 02/19/2018   OSA (obstructive sleep apnea)    "Mild" by records   PONV (postoperative nausea and vomiting)    first time, no problem since then   RBBB 01/2016   Right inguinal hernia 02/03/2016   ? Noted 02/2015 sent to surgery    Splenic marginal zone b-cell lymphoma ([place]) 12/18/2011   Splenomegaly    STEMI (ST elevation myocardial infarction) [facility])    August 2015   Subarachnoid hematoma ([place]) 03/12/2018   Subarachnoid hemorrhage ([place]) 03/05/2018   Past Surgical History:  Procedure Laterality Date   CARDIAC CATHETERIZATION     stents x 3 (03/2014)   CHOLECYSTECTOMY     INGUINAL HERNIA REPAIR Right 05/27/2015   Procedure: LAPAROSCOPIC REPAIR RIGHT  INGUINAL HERNIA;  Surgeon: Greer Pickerel, MD;  [facility];  Service:  General;  Laterality: Right;   INSERTION OF MESH Right 05/27/2015   Procedure: INSERTION OF MESH;  Surgeon: Greer Pickerel, MD;  [facility];  Service: General;  Laterality: Right;   LUMBAR DISC SURGERY     TONSILLECTOMY AND ADENOIDECTOMY     TOTAL KNEE ARTHROPLASTY Right 07/09/2018   Procedure: RIGHT TOTAL KNEE ARTHROPLASTY;  Surgeon: Garald Balding, MD;  Location: [place];  Service: Orthopedics;  Laterality: Right;   Family History  Problem Relation Age of Onset   Kidney disease Sister    Colon cancer Sister 72   Colon cancer Brother    Bone cancer Brother    Arthritis Mother    Breast cancer Mother    Heart disease Mother    Early death Father    Arthritis Son    Esophageal cancer Sister    Bladder Cancer Sister    Rectal cancer Neg Hx    Stomach cancer Neg Hx    Allergies as of 09/08/2022   No Known Allergies      Medication List         Accurate as of September 08, 2022  4:03 PM. If you have any questions, ask your nurse or doctor.          amLODipine 5 MG tablet Commonly known as: NORVASC Take 1 tablet (5 mg total) by mouth daily.   aspirin EC 81 MG tablet Take 81 mg by mouth daily.   atorvastatin 40 MG tablet Commonly known as: LIPITOR Take 1 tablet (40 mg total) by mouth daily.   diclofenac Sodium 1 % Gel Commonly known as: VOLTAREN APPLY 2 GRAMS TO AFFECTED AREA 4 TIMES A DAY   esomeprazole 20 MG capsule Commonly known as: NEXIUM Take 20 mg by mouth every morning.   ferrous sulfate 324 MG Tbec Take 324 mg by mouth daily.   furosemide 20 MG tablet Commonly known as: LASIX Take 0.5 tablets (10 mg total) by mouth daily as needed.   gabapentin 100 MG capsule Commonly known as: NEURONTIN Take 1-3 capsules (100-300 mg total) by mouth at bedtime.   lisinopril 40 MG tablet Commonly known as: ZESTRIL Take 1 tablet (40 mg total) by mouth daily.   metoprolol tartrate 25 MG tablet Commonly known as: LOPRESSOR Take 0.5 tablets (12.5 mg total) by mouth 2 (two) times daily.   nitroGLYCERIN 0.4 MG SL tablet Commonly known as: NITROSTAT Place 1 tablet (0.4 mg total) under the tongue every 5 (five) minutes as needed for chest pain.        All past medical history, surgical history, allergies, family history, immunizations andmedications were updated in the EMR today and reviewed under the history and medication portions of their EMR.     ROS Negative, with the exception of above mentioned in HPI   Objective:  BP (!) 144/65   Pulse (!) 52   Temp 98.2 F (36.8 C)   Wt 171 lb (77.6 kg)   SpO2 97%   BMI 26.00 kg/m  Body mass index is 26 kg/m. Physical Exam Vitals and nursing note reviewed.  Constitutional:      General: He is not in acute distress.    Appearance: Normal appearance. He is not ill-appearing, toxic-appearing or diaphoretic.  HENT:     Head: Normocephalic and atraumatic.  Eyes:      General: No scleral icterus.       Right eye: No discharge.        Left eye: No discharge.  Extraocular Movements: Extraocular movements intact.     Pupils: Pupils are equal, round, and reactive to light.  Musculoskeletal:       Legs:     Comments: No skin changes. No rash, erythema or abscess. TTP over right sciatica/ piriformis location. Discomfort with hip flexion with resistance at this lx. MS 5/5 bilateral LE.Mild discomfort at lx with right SLR. NV intact distally.   Skin:    General: Skin is warm and dry.     Coloration: Skin is not jaundiced or pale.     Findings: No rash.  Neurological:     Mental Status: He is alert and oriented to person, place, and time. Mental status is at baseline.  Psychiatric:        Mood and Affect: Mood normal.        Behavior: Behavior normal.        Thought Content: Thought content normal.        Judgment: Judgment normal.      No results found. No results found. No results found for this or any previous visit (from the past 24 hour(s)).  Assessment/Plan: Marcus Taylor is a 82 y.o. male present for OV for  Sciatic nerve pain, right Focal lx of pain over sciatic nerve/piriformis area.  Encouraged light stretches and heat application Flexeril 5 mg BID prn Prednisone taper F/u 2 weeks if not appreciating improvement, sooner if needed  Reviewed expectations re: course of current medical issues. Discussed self-management of symptoms. Outlined signs and symptoms indicating need for more acute intervention. Patient verbalized understanding and all questions were answered. Patient received an After-Visit Summary.    No orders of the defined types were placed in this encounter.  No orders of the defined types were placed in this encounter.  Referral Orders  No referral(s) requested today     Note is dictated utilizing voice recognition software. Although note has been proof read prior to signing, occasional typographical errors  still can be missed. If any questions arise, please do not hesitate to call for verification.   electronically signed by:  Howard Pouch, DO  [place]

## 2022-09-08 NOTE — Patient Instructions (Addendum)
Return in about 2 weeks (around 09/22/2022), or if symptoms worsen or fail to improve.        Great to see you today.  I have refilled the medication(s) we provide.   If labs were collected, we will inform you of lab results once received either by echart message or telephone call.   - echart message- for normal results that have been seen by the patient already.   - telephone call: abnormal results or if patient has not viewed results in their echart.

## 2022-10-06 ENCOUNTER — Ambulatory Visit: Payer: Medicare Other

## 2022-10-06 ENCOUNTER — Ambulatory Visit: Payer: Medicare Other | Admitting: Podiatry

## 2022-10-06 VITALS — BP 163/85 | HR 77

## 2022-10-06 DIAGNOSIS — M79676 Pain in unspecified toe(s): Secondary | ICD-10-CM

## 2022-10-06 DIAGNOSIS — L84 Corns and callosities: Secondary | ICD-10-CM

## 2022-10-06 DIAGNOSIS — B351 Tinea unguium: Secondary | ICD-10-CM | POA: Diagnosis not present

## 2022-10-06 NOTE — Progress Notes (Signed)
Subjective: Chief Complaint  Patient presents with   Nail Problem    Routine Foot Care     82 y.o. returns the office today for painful, elongated, thickened toenails which he cannot trim himself. Denies any redness or drainage around the nails.  He has been getting pain to the right big toe.  He states that typically feels better after he has them trimmed. No new concerns.  No open lesions.  PCP: Howard Pouch A, DO Last seen 09/08/2021 Last A1c: 5.3 on May 26, 2022  Objective: AAO 3, NAD DP/PT pulses palpable, CRT less than 3 seconds  Nails hypertrophic, dystrophic, elongated, brittle, discolored 10. There is tenderness overlying the nails 1-5 bilaterally. There is no surrounding erythema or drainage along the nail sites. Hyperkeratotic lesion along the lateral aspect hallux, medial second toe complete the toes are rubbing and there is some dried blood but upon debridement no underlying ulceration drainage or signs of infection No pain with calf compression, swelling, warmth, erythema.  Assessment: Patient presents with symptomatic onychomycosis; hyperkeratotic lesions  Plan: -Treatment options including alternatives, risks, complications were discussed -Nails sharply debrided 10 without complication/bleeding. -Debrided hyperkeratotic lesions x 2 without any complications or bleeding.  Continue offloading. -Discussed daily foot inspection. If there are any changes, to call the office immediately.  -Follow-up in 3 months or sooner if any problems are to arise. In the meantime, encouraged to call the office with any questions, concerns, changes symptoms.  Celesta Gentile, DPM

## 2022-10-09 ENCOUNTER — Ambulatory Visit (INDEPENDENT_AMBULATORY_CARE_PROVIDER_SITE_OTHER): Payer: Medicare Other

## 2022-10-09 DIAGNOSIS — E538 Deficiency of other specified B group vitamins: Secondary | ICD-10-CM | POA: Diagnosis not present

## 2022-10-09 MED ORDER — CYANOCOBALAMIN 1000 MCG/ML IJ SOLN
1000.0000 ug | INTRAMUSCULAR | Status: AC
  Administered 2022-10-09 – 2024-08-11 (×18): 1000 ug via INTRAMUSCULAR

## 2022-10-09 NOTE — Progress Notes (Signed)
Pt here for monthly B12 injection per Dr.Kuneff   B12 1061mg given IM left deltoid, and pt tolerated injection well.  Next B12 injection scheduled for 1 month.

## 2022-11-07 ENCOUNTER — Ambulatory Visit (INDEPENDENT_AMBULATORY_CARE_PROVIDER_SITE_OTHER): Payer: Medicare Other

## 2022-11-07 DIAGNOSIS — E538 Deficiency of other specified B group vitamins: Secondary | ICD-10-CM

## 2022-11-07 MED ORDER — CYANOCOBALAMIN 1000 MCG/ML IJ SOLN
1000.0000 ug | Freq: Once | INTRAMUSCULAR | Status: AC
  Administered 2022-11-07: 1000 ug via INTRAMUSCULAR

## 2022-11-07 NOTE — Progress Notes (Signed)
Pt here for monthly B12 injection per   B12 1073mcg given IM, and pt tolerated injection well.  Next B12 injection scheduled for 12/05/22

## 2022-11-09 ENCOUNTER — Encounter: Payer: Self-pay | Admitting: Family Medicine

## 2022-11-09 ENCOUNTER — Ambulatory Visit (INDEPENDENT_AMBULATORY_CARE_PROVIDER_SITE_OTHER): Payer: Medicare Other | Admitting: Family Medicine

## 2022-11-09 VITALS — BP 128/67 | HR 52 | Temp 98.1°F | Wt 171.2 lb

## 2022-11-09 DIAGNOSIS — D539 Nutritional anemia, unspecified: Secondary | ICD-10-CM

## 2022-11-09 DIAGNOSIS — E118 Type 2 diabetes mellitus with unspecified complications: Secondary | ICD-10-CM | POA: Diagnosis not present

## 2022-11-09 DIAGNOSIS — I252 Old myocardial infarction: Secondary | ICD-10-CM

## 2022-11-09 DIAGNOSIS — C8307 Small cell B-cell lymphoma, spleen: Secondary | ICD-10-CM

## 2022-11-09 DIAGNOSIS — I2510 Atherosclerotic heart disease of native coronary artery without angina pectoris: Secondary | ICD-10-CM | POA: Diagnosis not present

## 2022-11-09 DIAGNOSIS — E538 Deficiency of other specified B group vitamins: Secondary | ICD-10-CM

## 2022-11-09 DIAGNOSIS — E1169 Type 2 diabetes mellitus with other specified complication: Secondary | ICD-10-CM | POA: Diagnosis not present

## 2022-11-09 DIAGNOSIS — D696 Thrombocytopenia, unspecified: Secondary | ICD-10-CM | POA: Diagnosis not present

## 2022-11-09 DIAGNOSIS — E785 Hyperlipidemia, unspecified: Secondary | ICD-10-CM | POA: Diagnosis not present

## 2022-11-09 DIAGNOSIS — I10 Essential (primary) hypertension: Secondary | ICD-10-CM

## 2022-11-09 LAB — VITAMIN B12: Vitamin B-12: 920 pg/mL — ABNORMAL HIGH (ref 211–911)

## 2022-11-09 LAB — BASIC METABOLIC PANEL
BUN: 24 mg/dL — ABNORMAL HIGH (ref 6–23)
CO2: 31 mEq/L (ref 19–32)
Calcium: 8.9 mg/dL (ref 8.4–10.5)
Chloride: 107 mEq/L (ref 96–112)
Creatinine, Ser: 0.7 mg/dL (ref 0.40–1.50)
GFR: 86.46 mL/min (ref >60.00)
Glucose, Bld: 110 mg/dL — ABNORMAL HIGH (ref 70–99)
Potassium: 3.7 mEq/L (ref 3.5–5.1)
Sodium: 143 mEq/L (ref 135–145)

## 2022-11-09 LAB — HEMOGLOBIN A1C: Hgb A1c MFr Bld: 5.5 % (ref 4.6–6.5)

## 2022-11-09 MED ORDER — ATORVASTATIN CALCIUM 40 MG PO TABS: 40.0000 mg | ORAL_TABLET | Freq: Every day | ORAL | 3 refills | Status: DC

## 2022-11-09 MED ORDER — FUROSEMIDE 20 MG PO TABS: 10.0000 mg | ORAL_TABLET | Freq: Every day | ORAL | 0 refills | Status: DC | PRN

## 2022-11-09 MED ORDER — GABAPENTIN 100 MG PO CAPS: 100.0000 mg | ORAL_CAPSULE | Freq: Every day | ORAL | 5 refills | Status: DC

## 2022-11-09 MED ORDER — METOPROLOL TARTRATE 25 MG PO TABS: 12.5000 mg | ORAL_TABLET | Freq: Two times a day (BID) | ORAL | 1 refills | Status: DC

## 2022-11-09 MED ORDER — LISINOPRIL 40 MG PO TABS: 40.0000 mg | ORAL_TABLET | Freq: Every day | ORAL | 1 refills | Status: DC

## 2022-11-09 MED ORDER — AMLODIPINE BESYLATE 5 MG PO TABS: 5.0000 mg | ORAL_TABLET | Freq: Every day | ORAL | 1 refills | Status: DC

## 2022-11-09 NOTE — Patient Instructions (Addendum)
Return in about 24 weeks (around 04/26/2023) for Routine chronic condition follow-up.        Great to see you today.  I have refilled the medication(s) we provide.   If labs were collected, we will inform you of lab results once received either by echart message or telephone call.   - echart message- for normal results that have been seen by the patient already.   - telephone call: abnormal results or if patient has not viewed results in their echart.

## 2022-11-09 NOTE — Progress Notes (Signed)
Patient ID: Marcus Taylor, male  DOB: August 31, 1940, 82 y.o.   MRN: [id number] Patient Care Team    Relationship Specialty Notifications Start End  [name], DO PCP - General Family Medicine  01/24/22   [name], MD (Inactive) Consulting Physician Gastroenterology  01/26/16   [name], MD Consulting Physician Hematology and Oncology  01/26/16   [name], MD Referring Physician Cardiology  01/26/16   [name], MD Consulting Physician Neurosurgery  01/27/16   [name], OD Referring Physician   05/03/16    Comment: opthalmology   Garald Balding, MD (Inactive) Consulting Physician Orthopedic Surgery  06/14/18   Trula Slade, DPM Consulting Physician Podiatry  06/14/18     Chief Complaint  Patient presents with   Hypertension    [facility]; pt is fasting    Subjective:  Marcus Taylor is a 82 y.o. male present for [facility]. All past medical history, surgical history, allergies, family history, immunizations, medications and social history were updated in the electronic medical record today. All recent labs, ED visits and hospitalizations within the last year were reviewed.  Hypertension/H/o MI/CAD/stentsx3/iron deficiency anemia: Pt routinely follows with cardiology at [place], Dr. [name]. Pt has had stent placement and MI in 2015. He is compliant with Lipitor, lisinopril 40 mg, lopressor 12.5 (BID), amlodipine 5 mg qd. He takes a daily baby asa. Lasix 10-20 mg qd prn.  He is prescribed nitro, he -has not needed it. Plavix  stopped (> 75yr since stent)  .  Patient denies chest pain, shortness of breath, dizziness or lower extremity edema.   Diabetes type 2: Patient reports compliance with  metformin 500 mg Monday Wednesday and Friday.  Patient denies dizziness, hyperglycemic or hypoglycemic events. Patient denies numbness, tingling in the extremities or nonhealing wounds of feet.    iron deficiency anemia:  Iron UTD        11/09/2022    7:58 AM 08/23/2022     8:02 AM 08/17/2021    9:33 AM 07/04/2021    8:53 AM 12/21/2020    9:57 AM  Depression screen PHQ 2/9  Decreased Interest 0 0 0 0 0  Down, Depressed, Hopeless 0 0 0 0 0  PHQ - 2 Score 0 0 0 0 0       No data to display                11/09/2022    7:58 AM 08/23/2022    8:02 AM 09/14/2021    9:42 AM 08/17/2021    9:35 AM 07/04/2021    8:53 AM  Fall Risk   Falls in the past year? 0 0 0 0 0  Number falls in past yr: 0 0 0 0 0  Injury with Fall? 0 0 0 0 0  Risk for fall due to :    Impaired vision No Fall Risks  Follow up Falls evaluation completed Falls evaluation completed Falls evaluation completed Falls prevention discussed Falls evaluation completed   Immunization History  Administered Date(s) Administered   Fluad Quad(high Dose 65+) 04/28/2019, 05/10/2020, 07/04/2021, 05/08/2022   Influenza Split 05/07/2012   Influenza, High Dose Seasonal PF 05/03/2016, 07/02/2017, 05/20/2018   Influenza,inj,Quad PF,6+ Mos 09/22/2013, 05/25/2014, 05/31/2015   PFIZER(Purple Top)SARS-COV-2 Vaccination 10/05/2019, 10/29/2019   Pneumococcal Conjugate-13 09/28/2014   Pneumococcal Polysaccharide-23 10/05/2011   Tdap 03/24/2014   Zoster Recombinat (Shingrix) 05/30/2018, 09/17/2018    Past Medical History:  Diagnosis Date   Anemia  Arthritis    Cervical radiculopathy 12/22/2019   Colonic polyp    Concussion 03/05/2018   Coronary artery disease    stents x 3 (03-2014)   Diabetes mellitus    Diverticulosis    throughout entire colon   History of total right knee replacement 07/09/2018   HTN (hypertension)    Hypertriglyceridemia    Ingrown toenail 10/21/2019   Kidney stone    Left hamstring injury 11/02/2020   Lymphocytosis    Metatarsal fracture left   left 5th proximal phalanx    Neck pain 02/19/2018   OSA (obstructive sleep apnea)    "Mild" by records   PONV (postoperative nausea and vomiting)    first time, no problem since then   RBBB 01/2016   Right inguinal hernia 02/03/2016    ? Noted 02/2015 sent to surgery    Splenic marginal zone b-cell lymphoma ([place]) 12/18/2011   Splenomegaly    STEMI (ST elevation myocardial infarction) [facility])    August 2015   Subarachnoid hematoma ([place]) 03/12/2018   Subarachnoid hemorrhage ([place]) 03/05/2018   No Known Allergies Past Surgical History:  Procedure Laterality Date   CARDIAC CATHETERIZATION     stents x 3 (03/2014)   CHOLECYSTECTOMY     INGUINAL HERNIA REPAIR Right 05/27/2015   Procedure: LAPAROSCOPIC REPAIR RIGHT  INGUINAL HERNIA;  Surgeon: Greer Pickerel, MD;  Location: [facility];  Service: General;  Laterality: Right;   INSERTION OF MESH Right 05/27/2015   Procedure: INSERTION OF MESH;  Surgeon: Greer Pickerel, MD;  Location: [facility];  Service: General;  Laterality: Right;   LUMBAR DISC SURGERY     TONSILLECTOMY AND ADENOIDECTOMY     TOTAL KNEE ARTHROPLASTY Right 07/09/2018   Procedure: RIGHT TOTAL KNEE ARTHROPLASTY;  Surgeon: Garald Balding, MD;  Location: [place];  Service: Orthopedics;  Laterality: Right;   Family History  Problem Relation Age of Onset   Kidney disease Sister    Colon cancer Sister 68   Colon cancer Brother    Bone cancer Brother    Arthritis Mother    Breast cancer Mother    Heart disease Mother    Early death Father    Arthritis Son    Esophageal cancer Sister    Bladder Cancer Sister    Rectal cancer Neg Hx    Stomach cancer Neg Hx    Social History   Social History Narrative   Married to [place].    HS education. Retired.    Former smoker. No drugs or ETOH.   Wears seatbelt.   Wears dentures.   Smoke detector in the home. Firearms  In the home.    Feels safe in his relationships.     Allergies as of 11/09/2022   No Known Allergies      Medication List        Accurate as of November 09, 2022  8:15 AM. If you have any questions, ask your nurse or doctor.          STOP taking these medications    cyclobenzaprine 5 MG tablet Commonly known as: FLEXERIL Stopped by: Howard Pouch,  DO   predniSONE 20 MG tablet Commonly known as: DELTASONE Stopped by: Howard Pouch, DO       TAKE these medications    amLODipine 5 MG tablet Commonly known as: NORVASC Take 1 tablet (5 mg total) by mouth daily.   aspirin EC 81 MG tablet Take 81 mg by mouth daily.   atorvastatin 40 MG tablet Commonly  known as: LIPITOR Take 1 tablet (40 mg total) by mouth daily.   diclofenac Sodium 1 % Gel Commonly known as: VOLTAREN APPLY 2 GRAMS TO AFFECTED AREA 4 TIMES A DAY   esomeprazole 20 MG capsule Commonly known as: NEXIUM Take 20 mg by mouth every morning.   ferrous sulfate 324 MG Tbec Take 324 mg by mouth daily.   furosemide 20 MG tablet Commonly known as: LASIX Take 0.5 tablets (10 mg total) by mouth daily as needed.   gabapentin 100 MG capsule Commonly known as: NEURONTIN Take 1-3 capsules (100-300 mg total) by mouth at bedtime.   lisinopril 40 MG tablet Commonly known as: ZESTRIL Take 1 tablet (40 mg total) by mouth daily.   metoprolol tartrate 25 MG tablet Commonly known as: LOPRESSOR Take 0.5 tablets (12.5 mg total) by mouth 2 (two) times daily.   nitroGLYCERIN 0.4 MG SL tablet Commonly known as: NITROSTAT Place 1 tablet (0.4 mg total) under the tongue every 5 (five) minutes as needed for chest pain.       All past medical history, surgical history, allergies, family history, immunizations andmedications were updated in the EMR today and reviewed under the history and medication portions of their EMR.       ROS 14 pt review of systems performed and negative (unless mentioned in an HPI)  Objective: BP 128/67   Pulse (!) 52   Temp 98.1 F (36.7 C)   Wt 171 lb 3.2 oz (77.7 kg)   SpO2 99%   BMI 26.03 kg/m  Physical Exam Vitals and nursing note reviewed.  Constitutional:      General: He is not in acute distress.    Appearance: Normal appearance. He is not ill-appearing, toxic-appearing or diaphoretic.  HENT:     Head: Normocephalic and  atraumatic.  Eyes:     General: No scleral icterus.       Right eye: No discharge.        Left eye: No discharge.     Extraocular Movements: Extraocular movements intact.     Pupils: Pupils are equal, round, and reactive to light.  Cardiovascular:     Rate and Rhythm: Normal rate and regular rhythm.  Pulmonary:     Effort: Pulmonary effort is normal. No respiratory distress.     Breath sounds: Normal breath sounds. No wheezing, rhonchi or rales.  Musculoskeletal:     Cervical back: Neck supple.     Right lower leg: No edema.     Left lower leg: No edema.  Lymphadenopathy:     Cervical: No cervical adenopathy.  Skin:    General: Skin is warm and dry.     Coloration: Skin is not jaundiced or pale.     Findings: No rash.  Neurological:     Mental Status: He is alert and oriented to person, place, and time. Mental status is at baseline.  Psychiatric:        Mood and Affect: Mood normal.        Behavior: Behavior normal.        Thought Content: Thought content normal.        Judgment: Judgment normal.     No results found.  Assessment/plan: Marcus Taylor is a 82 y.o. male present for [facility] Iron deficiency anemia:  Follows with hematology   Controlled type 2 diabetes mellitus with complication, without long-term current use of insulin ([place]) Diet controlled A1c: 6.2 --> 6.0>5.7>> 5.9>5.8>6.0> 5.9> 5.3> 5.4> 5.3 > off meds> A1c collected today  -  Would not advise much more weight loss . - Opthalmology: 11/2021 [place] Dr. [name].  - pna series UTD/completed  - flu shot up-to-date 2023 - foot exam completed 05/26/2022 - urine micro collected UTD   Essential hypertension/Coronary artery disease involving native coronary artery of native heart without angina pectoris/History of ST elevation myocardial infarction (STEMI) Stable - Follows yearly with cardiology.  Continue lopressor 12.5 twice daily> monitor HR, if 40s, decrease to 1/2 tab once daily Continue lisinopril 40  mg daily Continue amlodipine 5 mg qd Continue statin  - Low sodium diet encouraged. BMP collected today   Splenic Margicnal Zone B-cell lymphoma/thrombocytopenia/anemia of chronic disease/pancytopenia/lymphocytosis: - continue to  follow up with heme/onc.  - cbc - baseline 29> following with heme. No need to call critical lab (also written on lab order) Thrombocytopenia ([place]) Cbc UTD- has heme appt soon B12 deficiency - B12 collected today Continue B12 injections by nurse visit 4 weeks controls   Return in about 24 weeks (around 04/26/2023) for Routine chronic condition follow-up.   Orders Placed This Encounter  Procedures   B12   Hemoglobin [id number]   Basic Metabolic Panel (BMET)   Meds ordered this encounter  Medications   furosemide (LASIX) 20 MG tablet    Sig: Take 0.5 tablets (10 mg total) by mouth daily as needed.    Dispense:  45 tablet    Refill:  0   lisinopril (ZESTRIL) 40 MG tablet    Sig: Take 1 tablet (40 mg total) by mouth daily.    Dispense:  90 tablet    Refill:  1   atorvastatin (LIPITOR) 40 MG tablet    Sig: Take 1 tablet (40 mg total) by mouth daily.    Dispense:  90 tablet    Refill:  3   gabapentin (NEURONTIN) 100 MG capsule    Sig: Take 1-3 capsules (100-300 mg total) by mouth at bedtime.    Dispense:  90 capsule    Refill:  5   amLODipine (NORVASC) 5 MG tablet    Sig: Take 1 tablet (5 mg total) by mouth daily.    Dispense:  90 tablet    Refill:  1   metoprolol tartrate (LOPRESSOR) 25 MG tablet    Sig: Take 0.5 tablets (12.5 mg total) by mouth 2 (two) times daily.    Dispense:  90 tablet    Refill:  1   Referral Orders  No referral(s) requested today    Note is dictated utilizing voice recognition software. Although note has been proof read prior to signing, occasional typographical errors still can be missed. If any questions arise, please do not hesitate to call for verification.  Electronically signed by: Howard Pouch, DO [place]

## 2022-12-05 ENCOUNTER — Ambulatory Visit (INDEPENDENT_AMBULATORY_CARE_PROVIDER_SITE_OTHER): Payer: Medicare Other

## 2022-12-05 DIAGNOSIS — E538 Deficiency of other specified B group vitamins: Secondary | ICD-10-CM

## 2022-12-05 NOTE — Progress Notes (Signed)
Pt here for monthly B12 injection per    B12 1000mcg given IM, and pt tolerated injection well.   Next B12 injection scheduled for 1 month 

## 2022-12-06 DIAGNOSIS — E119 Type 2 diabetes mellitus without complications: Secondary | ICD-10-CM | POA: Diagnosis not present

## 2022-12-06 DIAGNOSIS — Z7984 Long term (current) use of oral hypoglycemic drugs: Secondary | ICD-10-CM | POA: Diagnosis not present

## 2022-12-06 LAB — HM DIABETES EYE EXAM

## 2022-12-12 ENCOUNTER — Encounter: Payer: Self-pay | Admitting: Hematology and Oncology

## 2022-12-12 ENCOUNTER — Inpatient Hospital Stay (HOSPITAL_BASED_OUTPATIENT_CLINIC_OR_DEPARTMENT_OTHER): Payer: Medicare Other | Admitting: Hematology and Oncology

## 2022-12-12 ENCOUNTER — Other Ambulatory Visit: Payer: Self-pay

## 2022-12-12 ENCOUNTER — Inpatient Hospital Stay: Payer: Medicare Other | Attending: Hematology and Oncology

## 2022-12-12 VITALS — BP 134/43 | HR 79 | Temp 97.7°F | Resp 18 | Ht 68.0 in | Wt 170.4 lb

## 2022-12-12 DIAGNOSIS — C8307 Small cell B-cell lymphoma, spleen: Secondary | ICD-10-CM | POA: Diagnosis not present

## 2022-12-12 DIAGNOSIS — D696 Thrombocytopenia, unspecified: Secondary | ICD-10-CM | POA: Diagnosis not present

## 2022-12-12 DIAGNOSIS — D500 Iron deficiency anemia secondary to blood loss (chronic): Secondary | ICD-10-CM | POA: Diagnosis not present

## 2022-12-12 DIAGNOSIS — D509 Iron deficiency anemia, unspecified: Secondary | ICD-10-CM | POA: Insufficient documentation

## 2022-12-12 DIAGNOSIS — C8300 Small cell B-cell lymphoma, unspecified site: Secondary | ICD-10-CM | POA: Diagnosis not present

## 2022-12-12 LAB — CBC WITH DIFFERENTIAL (CANCER CENTER ONLY)
Abs Immature Granulocytes: 0.04 10*3/uL (ref 0.00–0.07)
Basophils Absolute: 0.1 10*3/uL (ref 0.0–0.1)
Basophils Relative: 0 %
Eosinophils Absolute: 0.2 10*3/uL (ref 0.0–0.5)
Eosinophils Relative: 1 %
HCT: 31.1 % — ABNORMAL LOW (ref 39.0–52.0)
Hemoglobin: 10 g/dL — ABNORMAL LOW (ref 13.0–17.0)
Immature Granulocytes: 0 %
Lymphocytes Relative: 87 %
Lymphs Abs: 22.4 10*3/uL — ABNORMAL HIGH (ref 0.7–4.0)
MCH: 28.1 pg (ref 26.0–34.0)
MCHC: 32.2 g/dL (ref 30.0–36.0)
MCV: 87.4 fL (ref 80.0–100.0)
Monocytes Absolute: 0.9 10*3/uL (ref 0.1–1.0)
Monocytes Relative: 4 %
Neutro Abs: 2.1 10*3/uL (ref 1.7–7.7)
Neutrophils Relative %: 8 %
Platelet Count: 135 10*3/uL — ABNORMAL LOW (ref 150–400)
RBC: 3.56 MIL/uL — ABNORMAL LOW (ref 4.22–5.81)
RDW: 16.9 % — ABNORMAL HIGH (ref 11.5–15.5)
Smear Review: NORMAL
WBC Count: 25.7 10*3/uL — ABNORMAL HIGH (ref 4.0–10.5)
nRBC: 0 % (ref 0.0–0.2)

## 2022-12-12 LAB — CMP (CANCER CENTER ONLY)
ALT: 17 U/L (ref 0–44)
AST: 34 U/L (ref 15–41)
Albumin: 4.2 g/dL (ref 3.5–5.0)
Alkaline Phosphatase: 58 U/L (ref 38–126)
Anion gap: 3 — ABNORMAL LOW (ref 5–15)
BUN: 23 mg/dL (ref 8–23)
CO2: 32 mmol/L (ref 22–32)
Calcium: 9.1 mg/dL (ref 8.9–10.3)
Chloride: 107 mmol/L (ref 98–111)
Creatinine: 0.79 mg/dL (ref 0.61–1.24)
GFR, Estimated: >60 mL/min (ref >60)
Glucose, Bld: 97 mg/dL (ref 70–99)
Potassium: 4.1 mmol/L (ref 3.5–5.1)
Sodium: 142 mmol/L (ref 135–145)
Total Bilirubin: 0.3 mg/dL (ref 0.3–1.2)
Total Protein: 6 g/dL — ABNORMAL LOW (ref 6.5–8.1)

## 2022-12-12 NOTE — Progress Notes (Signed)
[facility] OFFICE PROGRESS NOTE  Patient Care Team: Natalia Leatherwood, DO as PCP - General (Family Medicine) Louis Meckel, MD (Inactive) as Consulting Physician (Gastroenterology) Artis Delay, MD as Consulting Physician (Hematology and Oncology) Naoma Diener, MD as Referring Physician (Cardiology) Aliene Beams, MD as Consulting Physician (Neurosurgery) Jill Side, OD as Referring Physician Valeria Batman, MD (Inactive) as Consulting Physician (Orthopedic Surgery) Vivi Barrack, DPM as Consulting Physician (Podiatry)  ASSESSMENT & PLAN:  Splenic marginal zone b-cell lymphoma [facility]) He has fluctuation of his white blood cell count and lymphocyte count but overall asymptomatic He had very mild thrombocytopenia but not symptomatic For now, I recommend close observation The patient is educated to watch for signs and symptoms of cancer recurrence I will see him again in 12 months for further follow-up  IDA (iron deficiency anemia) I recommend oral iron supplement daily I plan to recheck iron studies again next year  Thrombocytopenia [facility]) He has mild intermittent thrombocytopenia but not symptomatic Observe closely There is no contraindication for him to take antiplatelet agents  Orders Placed This Encounter  Procedures   CBC with Differential (Cancer Center Only)    Standing Status:   Future    Standing Expiration Date:   12/12/2023   CMP (Cancer Center only)    Standing Status:   Future    Standing Expiration Date:   12/12/2023   Iron and Iron Binding Capacity ([facility] only)    Standing Status:   Future    Standing Expiration Date:   12/12/2023   Ferritin    Standing Status:   Future    Standing Expiration Date:   12/12/2023    All questions were answered. The patient knows to call the clinic with any problems, questions or concerns. The total time spent in the appointment was 20 minutes encounter with patients including review of chart and  various tests results, discussions about plan of care and coordination of care plan   Artis Delay, MD 12/12/2022 11:40 AM  INTERVAL HISTORY: Please see below for problem oriented charting. he returns for surveillance follow-up for iron deficiency anemia, splenic marginal zone lymphoma and chronic thrombocytopenia He is doing well Denies recent abnormal weight loss no new lymphadenopathy The patient denies any recent signs or symptoms of bleeding such as spontaneous epistaxis, hematuria or hematochezia.  REVIEW OF SYSTEMS:   Constitutional: Denies fevers, chills or abnormal weight loss Eyes: Denies blurriness of vision Ears, nose, mouth, throat, and face: Denies mucositis or sore throat Respiratory: Denies cough, dyspnea or wheezes Cardiovascular: Denies palpitation, chest discomfort or lower extremity swelling Gastrointestinal:  Denies nausea, heartburn or change in bowel habits Skin: Denies abnormal skin rashes Lymphatics: Denies new lymphadenopathy or easy bruising Neurological:Denies numbness, tingling or new weaknesses Behavioral/Psych: Mood is stable, no new changes  All other systems were reviewed with the patient and are negative.  I have reviewed the past medical history, past surgical history, social history and family history with the patient and they are unchanged from previous note.  ALLERGIES:  has No Known Allergies.  MEDICATIONS:  Current Outpatient Medications  Medication Sig Dispense Refill   amLODipine (NORVASC) 5 MG tablet Take 1 tablet (5 mg total) by mouth daily. 90 tablet 1   aspirin EC 81 MG tablet Take 81 mg by mouth daily.     atorvastatin (LIPITOR) 40 MG tablet Take 1 tablet (40 mg total) by mouth daily. 90 tablet 3   diclofenac Sodium (VOLTAREN) 1 % GEL APPLY 2  GRAMS TO AFFECTED AREA 4 TIMES A DAY 600 g 1   esomeprazole (NEXIUM) 20 MG capsule Take 20 mg by mouth every morning.     ferrous sulfate 324 MG TBEC Take 324 mg by mouth daily.     furosemide  (LASIX) 20 MG tablet Take 0.5 tablets (10 mg total) by mouth daily as needed. 45 tablet 0   gabapentin (NEURONTIN) 100 MG capsule Take 1-3 capsules (100-300 mg total) by mouth at bedtime. 90 capsule 5   lisinopril (ZESTRIL) 40 MG tablet Take 1 tablet (40 mg total) by mouth daily. 90 tablet 1   metoprolol tartrate (LOPRESSOR) 25 MG tablet Take 0.5 tablets (12.5 mg total) by mouth 2 (two) times daily. 90 tablet 1   nitroGLYCERIN (NITROSTAT) 0.4 MG SL tablet Place 1 tablet (0.4 mg total) under the tongue every 5 (five) minutes as needed for chest pain. 10 tablet 0   Current Facility-Administered Medications  Medication Dose Route Frequency Provider Last Rate Last Admin   cyanocobalamin (VITAMIN B12) injection 1,000 mcg  1,000 mcg Intramuscular Q30 days Kuneff, Renee A, DO   1,000 mcg at 12/05/22 1610    SUMMARY OF ONCOLOGIC HISTORY: Oncology History  Splenic marginal zone b-cell lymphoma (H[facility])  12/18/2011 Initial Diagnosis   Splenic marginal zone b-cell lymphoma (HCC)   11/08/2020 Cancer Staging   Staging form: Hodgkin and Non-Hodgkin Lymphoma, AJCC 7th Edition - Clinical: S - Spleen - Signed by Artis Delay, MD on 11/08/2020 Staged by: Managing physician Stage prefix: Initial diagnosis     PHYSICAL EXAMINATION: ECOG PERFORMANCE STATUS: 0 - Asymptomatic  Vitals:   12/12/22 0900  BP: (!) 134/43  Pulse: 79  Resp: 18  Temp: 97.7 F (36.5 C)  SpO2: 100%   Filed Weights   12/12/22 0900  Weight: 170 lb 6.4 oz (77.3 kg)    GENERAL:alert, no distress and comfortable  NEURO: alert & oriented x 3 with fluent speech, no focal motor/sensory deficits  LABORATORY DATA:  I have reviewed the data as listed    Component Value Date/Time   NA 142 12/12/2022 0818   NA 144 05/31/2015 1404   K 4.1 12/12/2022 0818   K 4.7 05/31/2015 1404   CL 107 12/12/2022 0818   CL 102 10/07/2012 1013   CO2 32 12/12/2022 0818   CO2 29 05/31/2015 1404   GLUCOSE 97 12/12/2022 0818   GLUCOSE 99 05/31/2015  1404   GLUCOSE 199 (H) 10/07/2012 1013   BUN 23 12/12/2022 0818   BUN 18.3 05/31/2015 1404   CREATININE 0.79 12/12/2022 0818   CREATININE 0.86 03/13/2017 0759   CREATININE 0.9 05/31/2015 1404   CALCIUM 9.1 12/12/2022 0818   CALCIUM 9.2 05/31/2015 1404   PROT 6.0 (L) 12/12/2022 0818   PROT 6.3 (L) 05/31/2015 1404   ALBUMIN 4.2 12/12/2022 0818   ALBUMIN 3.9 05/31/2015 1404   AST 34 12/12/2022 0818   AST 28 05/31/2015 1404   ALT 17 12/12/2022 0818   ALT 23 05/31/2015 1404   ALKPHOS 58 12/12/2022 0818   ALKPHOS 79 05/31/2015 1404   BILITOT 0.3 12/12/2022 0818   BILITOT 0.46 05/31/2015 1404   GFRNONAA >60 12/12/2022 0818   GFRNONAA 85 03/13/2017 0759   GFRAA >60 05/03/2020 0857   GFRAA >89 03/13/2017 0759    No results found for: "SPEP", "UPEP"  Lab Results  Component Value Date   WBC 25.7 (H) 12/12/2022   NEUTROABS 2.1 12/12/2022   HGB 10.0 (L) 12/12/2022   HCT 31.1 (L) 12/12/2022  MCV 87.4 12/12/2022   PLT 135 (L) 12/12/2022      Chemistry      Component Value Date/Time   NA 142 12/12/2022 0818   NA 144 05/31/2015 1404   K 4.1 12/12/2022 0818   K 4.7 05/31/2015 1404   CL 107 12/12/2022 0818   CL 102 10/07/2012 1013   CO2 32 12/12/2022 0818   CO2 29 05/31/2015 1404   BUN 23 12/12/2022 0818   BUN 18.3 05/31/2015 1404   CREATININE 0.79 12/12/2022 0818   CREATININE 0.86 03/13/2017 0759   CREATININE 0.9 05/31/2015 1404      Component Value Date/Time   CALCIUM 9.1 12/12/2022 0818   CALCIUM 9.2 05/31/2015 1404   ALKPHOS 58 12/12/2022 0818   ALKPHOS 79 05/31/2015 1404   AST 34 12/12/2022 0818   AST 28 05/31/2015 1404   ALT 17 12/12/2022 0818   ALT 23 05/31/2015 1404   BILITOT 0.3 12/12/2022 0818   BILITOT 0.46 05/31/2015 1404

## 2022-12-12 NOTE — Assessment & Plan Note (Signed)
He has fluctuation of his white blood cell count and lymphocyte count but overall asymptomatic He had very mild thrombocytopenia but not symptomatic For now, I recommend close observation The patient is educated to watch for signs and symptoms of cancer recurrence I will see him again in 12 months for further follow-up

## 2022-12-12 NOTE — Assessment & Plan Note (Signed)
I recommend oral iron supplement daily I plan to recheck iron studies again next year

## 2022-12-12 NOTE — Assessment & Plan Note (Signed)
He has mild intermittent thrombocytopenia but not symptomatic Observe closely There is no contraindication for him to take antiplatelet agents 

## 2023-01-02 ENCOUNTER — Ambulatory Visit (INDEPENDENT_AMBULATORY_CARE_PROVIDER_SITE_OTHER): Payer: Medicare Other

## 2023-01-02 DIAGNOSIS — E538 Deficiency of other specified B group vitamins: Secondary | ICD-10-CM | POA: Diagnosis not present

## 2023-01-02 NOTE — Progress Notes (Signed)
Pt here for monthly B12 injection per   B12 given IM, and pt tolerated injection well.  Next B12 injection scheduled for 4 weeks from today.

## 2023-01-05 ENCOUNTER — Ambulatory Visit: Payer: Medicare Other | Admitting: Podiatry

## 2023-01-15 ENCOUNTER — Other Ambulatory Visit: Payer: Self-pay | Admitting: Family Medicine

## 2023-01-26 ENCOUNTER — Ambulatory Visit: Payer: Medicare Other | Admitting: Podiatry

## 2023-01-26 DIAGNOSIS — M79676 Pain in unspecified toe(s): Secondary | ICD-10-CM | POA: Diagnosis not present

## 2023-01-26 DIAGNOSIS — B351 Tinea unguium: Secondary | ICD-10-CM | POA: Diagnosis not present

## 2023-01-28 NOTE — Progress Notes (Signed)
Subjective: Chief Complaint  Patient presents with   Nail Problem    RFC   82 y.o. returns the office today for painful, elongated, thickened toenails which he cannot trim himself. Denies any redness or drainage around the nails.  No new concerns.   PCP: Felix Pacini A, DO Last seen 11/09/2022 Last A1c: 5.5 on 11/09/2022  Objective: AAO 3, NAD DP/PT pulses palpable, CRT less than 3 seconds  Nails hypertrophic, dystrophic, elongated, brittle, discolored 10. There is tenderness overlying the nails 1-5 bilaterally. There is no surrounding erythema or drainage along the nail sites. No pain with calf compression, swelling, warmth, erythema.  Assessment: Patient presents with symptomatic onychomycosis; hyperkeratotic lesions  Plan: -Treatment options including alternatives, risks, complications were discussed -Nails sharply debrided 10 without complication/bleeding. -Discussed daily foot inspection. If there are any changes, to call the office immediately.  -Follow-up in 3 months or sooner if any problems are to arise. In the meantime, encouraged to call the office with any questions, concerns, changes symptoms.  Ovid Curd, DPM

## 2023-01-30 ENCOUNTER — Ambulatory Visit (INDEPENDENT_AMBULATORY_CARE_PROVIDER_SITE_OTHER): Payer: Medicare Other

## 2023-01-30 DIAGNOSIS — E538 Deficiency of other specified B group vitamins: Secondary | ICD-10-CM

## 2023-01-30 NOTE — Progress Notes (Signed)
Pt here for monthly B12 injection per   B12 1000mcg given IM, and pt tolerated injection well.  Next B12 injection scheduled for 4 weeks from today. 

## 2023-03-01 ENCOUNTER — Ambulatory Visit (INDEPENDENT_AMBULATORY_CARE_PROVIDER_SITE_OTHER): Payer: Medicare Other

## 2023-03-01 DIAGNOSIS — E538 Deficiency of other specified B group vitamins: Secondary | ICD-10-CM

## 2023-03-01 NOTE — Progress Notes (Signed)
Pt here for monthly B12 injection per Dr.Kuneff   B12 1000mcg given IM, and pt tolerated injection well.   Next B12 injection scheduled for 1 month.       

## 2023-03-14 ENCOUNTER — Encounter (INDEPENDENT_AMBULATORY_CARE_PROVIDER_SITE_OTHER): Payer: Self-pay

## 2023-03-29 ENCOUNTER — Encounter (INDEPENDENT_AMBULATORY_CARE_PROVIDER_SITE_OTHER): Payer: Self-pay

## 2023-03-29 ENCOUNTER — Ambulatory Visit (INDEPENDENT_AMBULATORY_CARE_PROVIDER_SITE_OTHER): Payer: Medicare Other

## 2023-03-29 DIAGNOSIS — E538 Deficiency of other specified B group vitamins: Secondary | ICD-10-CM

## 2023-03-29 NOTE — Progress Notes (Signed)
Pt here for monthly B12 injection per Dr.Kuneff   B12 1000mcg given IM, and pt tolerated injection well.   Next B12 injection scheduled for 1 month.       

## 2023-04-05 ENCOUNTER — Ambulatory Visit (INDEPENDENT_AMBULATORY_CARE_PROVIDER_SITE_OTHER): Payer: Medicare Other

## 2023-04-05 DIAGNOSIS — Z23 Encounter for immunization: Secondary | ICD-10-CM | POA: Diagnosis not present

## 2023-04-11 NOTE — Addendum Note (Signed)
Addended by: Filomena Jungling on: 04/11/2023 02:54 PM   Modules accepted: Orders

## 2023-04-17 ENCOUNTER — Encounter: Payer: Self-pay | Admitting: Family Medicine

## 2023-04-18 ENCOUNTER — Other Ambulatory Visit: Payer: Self-pay | Admitting: Family Medicine

## 2023-04-26 ENCOUNTER — Ambulatory Visit (INDEPENDENT_AMBULATORY_CARE_PROVIDER_SITE_OTHER): Payer: Medicare Other | Admitting: Family Medicine

## 2023-04-26 ENCOUNTER — Encounter: Payer: Self-pay | Admitting: Family Medicine

## 2023-04-26 ENCOUNTER — Telehealth: Payer: Self-pay

## 2023-04-26 VITALS — BP 119/52 | HR 65 | Temp 97.5°F | Wt 165.6 lb

## 2023-04-26 DIAGNOSIS — E785 Hyperlipidemia, unspecified: Secondary | ICD-10-CM | POA: Diagnosis not present

## 2023-04-26 DIAGNOSIS — I10 Essential (primary) hypertension: Secondary | ICD-10-CM

## 2023-04-26 DIAGNOSIS — I2510 Atherosclerotic heart disease of native coronary artery without angina pectoris: Secondary | ICD-10-CM | POA: Diagnosis not present

## 2023-04-26 DIAGNOSIS — D630 Anemia in neoplastic disease: Secondary | ICD-10-CM

## 2023-04-26 DIAGNOSIS — E538 Deficiency of other specified B group vitamins: Secondary | ICD-10-CM | POA: Diagnosis not present

## 2023-04-26 DIAGNOSIS — C8307 Small cell B-cell lymphoma, spleen: Secondary | ICD-10-CM

## 2023-04-26 DIAGNOSIS — E1169 Type 2 diabetes mellitus with other specified complication: Secondary | ICD-10-CM | POA: Diagnosis not present

## 2023-04-26 DIAGNOSIS — D696 Thrombocytopenia, unspecified: Secondary | ICD-10-CM | POA: Diagnosis not present

## 2023-04-26 DIAGNOSIS — I252 Old myocardial infarction: Secondary | ICD-10-CM

## 2023-04-26 LAB — LIPID PANEL
Cholesterol: 98 mg/dL (ref 0–200)
HDL: 36 mg/dL — ABNORMAL LOW (ref >39.00)
LDL Cholesterol: 46 mg/dL (ref 0–99)
NonHDL: 61.84
Total CHOL/HDL Ratio: 3
Triglycerides: 79 mg/dL (ref 0.0–149.0)
VLDL: 15.8 mg/dL (ref 0.0–40.0)

## 2023-04-26 LAB — CBC WITH DIFFERENTIAL/PLATELET
Basophils Absolute: 0.1 10*3/uL (ref 0.0–0.1)
Basophils Relative: 0.2 % (ref 0.0–3.0)
Eosinophils Absolute: 0.2 10*3/uL (ref 0.0–0.7)
Eosinophils Relative: 0.6 % (ref 0.0–5.0)
HCT: 32.2 % — ABNORMAL LOW (ref 39.0–52.0)
Hemoglobin: 10 g/dL — ABNORMAL LOW (ref 13.0–17.0)
Lymphocytes Relative: 86.8 % — ABNORMAL HIGH (ref 12.0–46.0)
Lymphs Abs: 24.7 10*3/uL — ABNORMAL HIGH (ref 0.7–4.0)
MCHC: 31.1 g/dL (ref 30.0–36.0)
MCV: 85 fl (ref 78.0–100.0)
Monocytes Absolute: 0.6 10*3/uL (ref 0.1–1.0)
Monocytes Relative: 2.1 % — ABNORMAL LOW (ref 3.0–12.0)
Neutro Abs: 2.9 10*3/uL (ref 1.4–7.7)
Neutrophils Relative %: 10.3 % — ABNORMAL LOW (ref 43.0–77.0)
Platelets: 180 10*3/uL (ref 150.0–400.0)
RBC: 3.79 Mil/uL — ABNORMAL LOW (ref 4.22–5.81)
RDW: 17.3 % — ABNORMAL HIGH (ref 11.5–15.5)
WBC: 28.5 10*3/uL (ref 4.0–10.5)

## 2023-04-26 LAB — COMPREHENSIVE METABOLIC PANEL
ALT: 14 U/L (ref 0–53)
AST: 33 U/L (ref 0–37)
Albumin: 3.9 g/dL (ref 3.5–5.2)
Alkaline Phosphatase: 67 U/L (ref 39–117)
BUN: 23 mg/dL (ref 6–23)
CO2: 32 meq/L (ref 19–32)
Calcium: 9.2 mg/dL (ref 8.4–10.5)
Chloride: 105 meq/L (ref 96–112)
Creatinine, Ser: 0.73 mg/dL (ref 0.40–1.50)
GFR: 85.09 mL/min (ref >60.00)
Glucose, Bld: 89 mg/dL (ref 70–99)
Potassium: 4.1 meq/L (ref 3.5–5.1)
Sodium: 143 meq/L (ref 135–145)
Total Bilirubin: 0.5 mg/dL (ref 0.2–1.2)
Total Protein: 5.9 g/dL — ABNORMAL LOW (ref 6.0–8.3)

## 2023-04-26 LAB — MICROALBUMIN / CREATININE URINE RATIO
Creatinine,U: 106.9 mg/dL
Microalb Creat Ratio: 31.8 mg/g — ABNORMAL HIGH (ref 0.0–30.0)
Microalb, Ur: 34 mg/dL — ABNORMAL HIGH (ref 0.0–1.9)

## 2023-04-26 LAB — HEMOGLOBIN A1C: Hgb A1c MFr Bld: 5.7 % (ref 4.6–6.5)

## 2023-04-26 LAB — TSH: TSH: 1.52 u[IU]/mL (ref 0.35–5.50)

## 2023-04-26 MED ORDER — AMLODIPINE BESYLATE 5 MG PO TABS: 5.0000 mg | ORAL_TABLET | Freq: Every day | ORAL | 1 refills | Status: DC

## 2023-04-26 MED ORDER — METOPROLOL TARTRATE 25 MG PO TABS: 12.5000 mg | ORAL_TABLET | Freq: Two times a day (BID) | ORAL | 1 refills | Status: DC

## 2023-04-26 MED ORDER — GABAPENTIN 100 MG PO CAPS: 100.0000 mg | ORAL_CAPSULE | Freq: Every day | ORAL | 5 refills | Status: DC

## 2023-04-26 MED ORDER — LISINOPRIL 40 MG PO TABS: 40.0000 mg | ORAL_TABLET | Freq: Every day | ORAL | 1 refills | Status: DC

## 2023-04-26 MED ORDER — FUROSEMIDE 20 MG PO TABS: 10.0000 mg | ORAL_TABLET | Freq: Every day | ORAL | 1 refills | Status: DC | PRN

## 2023-04-26 NOTE — Telephone Encounter (Signed)
[facility] called to report critical lab of WBC 28.5, sending to PCP and current provider in office. Please advise of any recommendations

## 2023-04-26 NOTE — Telephone Encounter (Signed)
Noted. Reviewed patient's chart, provider note from today. This CBC is the patient's baseline.

## 2023-04-26 NOTE — Patient Instructions (Addendum)
Return in about 24 weeks (around 10/11/2023) for cpe (20 min), Routine chronic condition follow-up.        Great to see you today.  I have refilled the medication(s) we provide.   If labs were collected or images ordered, we will inform you of  results once we have received them and reviewed. We will contact you either by echart message, or telephone call.  Please give ample time to the testing facility, and our office to run,  receive and review results. Please do not call inquiring of results, even if you can see them in your chart. We will contact you as soon as we are able. If it has been over 1 week since the test was completed, and you have not yet heard from [place], then please call us.    - echart message- for normal results that have been seen by the patient already.   - telephone call: abnormal results or if patient has not viewed results in their echart.  If a referral to a specialist was entered for you, please call us in 2 weeks if you have not heard from the specialist office to schedule.

## 2023-04-26 NOTE — Progress Notes (Signed)
Patient ID: Marcus Taylor, male  DOB: [birth year], 82 y.o.   MRN: [id number] Patient Care Team    Relationship Specialty Notifications Start End  Natalia Leatherwood, DO PCP - General Family Medicine  01/24/22   Louis Meckel, MD (Inactive) Consulting Physician Gastroenterology  01/26/16   Artis Delay, MD Consulting Physician Hematology and Oncology  01/26/16   Naoma Diener, MD Referring Physician Cardiology  01/26/16   Aliene Beams, MD Consulting Physician Neurosurgery  01/27/16   Jill Side, OD Referring Physician   05/03/16    Comment: opthalmology   Valeria Batman, MD (Inactive) Consulting Physician Orthopedic Surgery  06/14/18   Vivi Barrack, DPM Consulting Physician Podiatry  06/14/18     Chief Complaint  Patient presents with   Hypertension    cmc    Subjective: Marcus Taylor is a 82 y.o. male present for [facility]. All past medical history, surgical history, allergies, family history, immunizations, medications and social history were updated in the electronic medical record today. All recent labs, ED visits and hospitalizations within the last year were reviewed.  Hypertension/H/o MI/CAD/stentsx3/iron deficiency anemia: Pt routinely follows with cardiology at [facility], Dr. Dion Body. Pt has had stent placement and MI in 2015. He is compliant with Lipitor 40 mg, lisinopril 40 mg, lopressor 12.5 (BID), amlodipine 5 mg qd. He takes a daily baby asa. Lasix 10-20 mg qd prn.  He is prescribed nitro, he -has not needed it. Plavix  stopped (> 41yr since stent)  .  Patient denies chest pain, shortness of breath, dizziness.  He has noticed occasional increase in his lower extremity edema and has used his Lasix on a few occasions recently.   Diabetes type 2: Patient  has been diet controlled for almost a year.  He had been on metformin in the past couple times a week only. Patient denies dizziness, hyperglycemic or hypoglycemic events. Patient denies numbness, tingling in the  extremities or nonhealing wounds of feet.  Patient reports he is still using the gabapentin 100-300 mg nightly  iron deficiency anemia:  Iron UTD       11/09/2022    7:58 AM 08/23/2022    8:02 AM 08/17/2021    9:33 AM 07/04/2021    8:53 AM 12/21/2020    9:57 AM  Depression screen PHQ 2/9  Decreased Interest 0 0 0 0 0  Down, Depressed, Hopeless 0 0 0 0 0  PHQ - 2 Score 0 0 0 0 0       No data to display                11/09/2022    7:58 AM 08/23/2022    8:02 AM 09/14/2021    9:42 AM 08/17/2021    9:35 AM 07/04/2021    8:53 AM  Fall Risk   Falls in the past year? 0 0 0 0 0  Number falls in past yr: 0 0 0 0 0  Injury with Fall? 0 0 0 0 0  Risk for fall due to :    Impaired vision No Fall Risks  Follow up Falls evaluation completed Falls evaluation completed Falls evaluation completed Falls prevention discussed Falls evaluation completed   Immunization History  Administered Date(s) Administered   Fluad Quad(high Dose 65+) 04/28/2019, 05/10/2020, 07/04/2021, 05/08/2022   Fluad Trivalent(High Dose 65+) 04/05/2023   Influenza Split 05/07/2012   Influenza, High Dose Seasonal PF 05/03/2016, 07/02/2017, 05/20/2018   Influenza,inj,Quad PF,6+ Mos 09/22/2013, 05/25/2014, 05/31/2015  PFIZER(Purple Top)SARS-COV-2 Vaccination 10/05/2019, 10/29/2019   Pneumococcal Conjugate-13 09/28/2014   Pneumococcal Polysaccharide-23 10/05/2011   Tdap 03/24/2014   Zoster Recombinant(Shingrix) 05/30/2018, 09/17/2018    Past Medical History:  Diagnosis Date   Anemia    Arthritis    Cervical radiculopathy 12/22/2019   Colonic polyp    Concussion 03/05/2018   Coronary artery disease    stents x 3 (03-2014)   Diabetes mellitus    Diverticulosis    throughout entire colon   History of colon polyps 01/27/2016   04/2017:  Three sessile polyps were found in the ascending colon. The polyps were 3 mm in size.  These polyps were removed with a cold biopsy forceps. Resection and retrieval were  complete.   - Two sessile polyps were found in the ascending colon. The polyps were 5 to 6 mm in size.  These polyps were removed with a cold snare. Resection and retrieval were complete.  - A 3 mm polyp was found in the    History of total right knee replacement 07/09/2018   HTN (hypertension)    Hypertriglyceridemia    Ingrown toenail 10/21/2019   Kidney stone    Left hamstring injury 11/02/2020   Lymphocytosis    Metatarsal fracture left   left 5th proximal phalanx    Neck pain 02/19/2018   OSA (obstructive sleep apnea)    "Mild" by records   PONV (postoperative nausea and vomiting)    first time, no problem since then   RBBB 01/2016   Right inguinal hernia 02/03/2016   ? 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   Splenomegaly    STEMI (ST elevation myocardial infarction) [facility])    August 2015   Subarachnoid hematoma (HCC) 03/12/2018   Subarachnoid hemorrhage (HCC) 03/05/2018   No Known Allergies Past Surgical History:  Procedure Laterality Date   CARDIAC CATHETERIZATION     stents x 3 (03/2014)   CHOLECYSTECTOMY     INGUINAL HERNIA REPAIR Right 05/27/2015   Procedure: LAPAROSCOPIC REPAIR RIGHT  INGUINAL HERNIA;  Surgeon: Gaynelle Adu, MD;  Location: [facility] ORS;  Service: General;  Laterality: Right;   INSERTION OF MESH Right 05/27/2015   Procedure: INSERTION OF MESH;  Surgeon: Gaynelle Adu, MD;  Location: [facility] ORS;  Service: General;  Laterality: Right;   LUMBAR DISC SURGERY     TONSILLECTOMY AND ADENOIDECTOMY     TOTAL KNEE ARTHROPLASTY Right 07/09/2018   Procedure: RIGHT TOTAL KNEE ARTHROPLASTY;  Surgeon: Valeria Batman, MD;  Location: [facility] OR;  Service: Orthopedics;  Laterality: Right;   Family History  Problem Relation Age of Onset   Kidney disease Sister    Colon cancer Sister 66   Colon cancer Brother    Bone cancer Brother    Arthritis Mother    Breast cancer Mother    Heart disease Mother    Early death Father    Arthritis Son     Esophageal cancer Sister    Bladder Cancer Sister    Rectal cancer Neg Hx    Stomach cancer Neg Hx    Social History   Social History Narrative   Married to [place].    HS education. Retired.    Former smoker. No drugs or ETOH.   Wears seatbelt.   Wears dentures.   Smoke detector in the home. Firearms  In the home.    Feels safe in his relationships.     Allergies as of 04/26/2023   No  Known Allergies      Medication List        Accurate as of April 26, 2023 12:37 PM. If you have any questions, ask your nurse or doctor.          amLODipine 5 MG tablet Commonly known as: NORVASC Take 1 tablet (5 mg total) by mouth daily.   aspirin EC 81 MG tablet Take 81 mg by mouth daily.   atorvastatin 40 MG tablet Commonly known as: LIPITOR Take 1 tablet (40 mg total) by mouth daily.   diclofenac Sodium 1 % Gel Commonly known as: VOLTAREN APPLY 2 GRAMS TO AFFECTED AREA 4 TIMES A DAY   esomeprazole 20 MG capsule Commonly known as: NEXIUM Take 20 mg by mouth every morning.   ferrous sulfate 324 MG Tbec Take 324 mg by mouth daily.   furosemide 20 MG tablet Commonly known as: LASIX Take 0.5-1 tablets (10-20 mg total) by mouth daily as needed. What changed: See the new instructions. Changed by: Felix Pacini   gabapentin 100 MG capsule Commonly known as: NEURONTIN Take 1-3 capsules (100-300 mg total) by mouth at bedtime.   lisinopril 40 MG tablet Commonly known as: ZESTRIL Take 1 tablet (40 mg total) by mouth daily.   metoprolol tartrate 25 MG tablet Commonly known as: LOPRESSOR Take 0.5 tablets (12.5 mg total) by mouth 2 (two) times daily.   nitroGLYCERIN 0.4 MG SL tablet Commonly known as: NITROSTAT Place 1 tablet (0.4 mg total) under the tongue every 5 (five) minutes as needed for chest pain.       All past medical history, surgical history, allergies, family history, immunizations andmedications were updated in the EMR today and reviewed under the  history and medication portions of their EMR.      ROS 14 pt review of systems performed and negative (unless mentioned in an HPI)  Objective: BP (!) 119/52   Pulse 65   Temp (!) 97.5 F (36.4 C)   Wt 165 lb 9.6 oz (75.1 kg)   SpO2 100%   BMI 25.18 kg/m  Physical Exam Vitals and nursing note reviewed.  Constitutional:      General: He is not in acute distress.    Appearance: Normal appearance. He is not ill-appearing, toxic-appearing or diaphoretic.  HENT:     Head: Normocephalic and atraumatic.  Eyes:     General: No scleral icterus.       Right eye: No discharge.        Left eye: No discharge.     Extraocular Movements: Extraocular movements intact.     Pupils: Pupils are equal, round, and reactive to light.  Cardiovascular:     Rate and Rhythm: Normal rate and regular rhythm.  Pulmonary:     Effort: Pulmonary effort is normal. No respiratory distress.     Breath sounds: Normal breath sounds. No wheezing, rhonchi or rales.  Musculoskeletal:     Cervical back: Neck supple.     Right lower leg: No edema.     Left lower leg: No edema.  Lymphadenopathy:     Cervical: No cervical adenopathy.  Skin:    General: Skin is warm and dry.     Coloration: Skin is not jaundiced or pale.     Findings: No rash.  Neurological:     Mental Status: He is alert and oriented to person, place, and time. Mental status is at baseline.  Psychiatric:        Mood and Affect: Mood normal.  Behavior: Behavior normal.        Thought Content: Thought content normal.        Judgment: Judgment normal.    Diabetic Foot Exam - Simple   Simple Foot Form Diabetic Foot exam was performed with the following findings: Yes 04/26/2023  8:38 AM  Visual Inspection No deformities, no ulcerations, no other skin breakdown bilaterally: Yes Sensation Testing Intact to touch and monofilament testing bilaterally: Yes Pulse Check Posterior Tibialis and Dorsalis pulse intact bilaterally: Yes Comments      No results found.  Assessment/plan: Marcus Taylor is a 82 y.o. male present for [facility] Iron deficiency anemia:  Follows with hematology   Controlled type 2 diabetes mellitus with complication, without long-term current use of insulin (HCC) Diet controlled A1c: 6.2 --> 6.0>5.7>> 5.9>5.8>6.0> 5.9> 5.3> 5.4> 5.3 > off meds> 5.5> A1c collected today  - Would not advise much more weight loss . - Opthalmology: 11/2022 [place] Dr. Cherlynn Polo.  - pna series UTD/completed  - flu shot up-to-date 2024 - foot exam completed 04/26/2023 - urine micro collected 04/26/2023   Essential hypertension/Coronary artery disease involving native coronary artery of native heart without angina pectoris/History of ST elevation myocardial infarction (STEMI) Stable - Follows yearly with cardiology.  Continue lopressor 12.5 twice daily> monitor HR, if 40s, decrease to 1/2 tab once daily Continue lisinopril 40 mg daily Continue amlodipine 5 mg qd Continue statin  - Low sodium diet encouraged. CBC, CMP, TSH and lipids collected today   Splenic Margicnal Zone B-cell lymphoma/thrombocytopenia/anemia of chronic disease/pancytopenia/lymphocytosis: - continue to  follow up with heme/onc.  - cbc collected today- baseline 25- 29> following with heme. No need to call critical lab  Thrombocytopenia (HCC) CBC collected today B12 deficiency B12 within normal range with every 4-week injection. Continue B12 injections by nurse visit 4 weeks controls B12 injection provided today  Return in about 24 weeks (around 10/11/2023) for cpe (20 min), Routine chronic condition follow-up.   Orders Placed This Encounter  Procedures   Microalbumin / creatinine urine ratio   CBC w/Diff   Comp Met (CMET)   Lipid panel   TSH   Hemoglobin A1c   Meds ordered this encounter  Medications   amLODipine (NORVASC) 5 MG tablet    Sig: Take 1 tablet (5 mg total) by mouth daily.    Dispense:  90 tablet    Refill:  1   gabapentin  (NEURONTIN) 100 MG capsule    Sig: Take 1-3 capsules (100-300 mg total) by mouth at bedtime.    Dispense:  90 capsule    Refill:  5   lisinopril (ZESTRIL) 40 MG tablet    Sig: Take 1 tablet (40 mg total) by mouth daily.    Dispense:  90 tablet    Refill:  1   metoprolol tartrate (LOPRESSOR) 25 MG tablet    Sig: Take 0.5 tablets (12.5 mg total) by mouth 2 (two) times daily.    Dispense:  90 tablet    Refill:  1   furosemide (LASIX) 20 MG tablet    Sig: Take 0.5-1 tablets (10-20 mg total) by mouth daily as needed.    Dispense:  90 tablet    Refill:  1   Referral Orders  No referral(s) requested today    Note is dictated utilizing voice recognition software. Although note has been proof read prior to signing, occasional typographical errors still can be missed. If any questions arise, please do not hesitate to call for verification.  Electronically signed  by: Felix Pacini, DO [facility] [place]

## 2023-05-04 ENCOUNTER — Ambulatory Visit: Payer: Medicare Other | Admitting: Podiatrist

## 2023-05-04 ENCOUNTER — Encounter: Payer: Self-pay | Admitting: Podiatrist

## 2023-05-04 ENCOUNTER — Ambulatory Visit: Payer: Medicare Other | Admitting: Podiatry

## 2023-05-04 DIAGNOSIS — B351 Tinea unguium: Secondary | ICD-10-CM | POA: Diagnosis not present

## 2023-05-04 DIAGNOSIS — M79676 Pain in unspecified toe(s): Secondary | ICD-10-CM | POA: Diagnosis not present

## 2023-05-04 NOTE — Progress Notes (Signed)
Chief Complaint  Patient presents with   Debridement    Trim toenails    Subjective:  82 y.o. returns the office today for painful, elongated, thickened toenails which he cannot trim himself. Denies any redness or drainage around the nails.  He is diabetic-  is experiencing bilateral foot pain dorsally worse at night.  He takes gabapentin currently for neuropathy.   PCP: Felix Pacini A, DO Last seen 04/26/2023 Last A1c: 5.8 on 04/26/2023  Objective: AAO 3, NAD DP/PT pulses palpable, CRT less than 3 seconds  Nails hypertrophic, dystrophic, elongated, brittle, discolored 10. There is tenderness overlying the nails 1-5 bilaterally. There is no surrounding erythema or drainage along the nail sites. No pain with calf compression, swelling, warmth, erythema. Contracture digits is noted.  No pinpoint area of discomfort dorsally on either foot.   Assessment: Patient presents with symptomatic onychomycosis; neuropathic foot pain Plan: -Treatment options including alternatives, risks, complications were discussed -Nails sharply debrided 10 without complication/bleeding. - discussed the foot pain he is experiencing.  He states he can take 2-3 of his gabapentin and he has been on 2 at night.  I suggested he try 3 before bed to see if this is helpful for his pain.  Also discussed shoegear and diabetic shoes as an option if interested. -Discussed daily foot inspection. If there are any changes, to call the office immediately.  -Follow-up in 3 months or sooner if any problems are to arise. In the meantime, encouraged to call the office with any questions, concerns, changes symptoms.  Marlowe Aschoff DPM

## 2023-05-24 ENCOUNTER — Ambulatory Visit: Payer: Medicare Other

## 2023-05-28 ENCOUNTER — Ambulatory Visit (INDEPENDENT_AMBULATORY_CARE_PROVIDER_SITE_OTHER): Payer: Medicare Other

## 2023-05-28 DIAGNOSIS — E538 Deficiency of other specified B group vitamins: Secondary | ICD-10-CM | POA: Diagnosis not present

## 2023-05-28 NOTE — Progress Notes (Signed)
Pt here for monthly B12 injection per Dr. Claiborne Billings  B12 given IM, and pt tolerated injection well.  Next B12 injection scheduled for 1 month

## 2023-06-13 ENCOUNTER — Other Ambulatory Visit: Payer: Self-pay | Admitting: Family Medicine

## 2023-06-25 ENCOUNTER — Ambulatory Visit (INDEPENDENT_AMBULATORY_CARE_PROVIDER_SITE_OTHER): Payer: Medicare Other

## 2023-06-25 DIAGNOSIS — E538 Deficiency of other specified B group vitamins: Secondary | ICD-10-CM

## 2023-06-25 NOTE — Progress Notes (Signed)
Pt here for monthly B12 injection per Dr.Kuneff   B12 1000mcg given IM, and pt tolerated injection well.   Next B12 injection scheduled for 1 month.       

## 2023-06-27 ENCOUNTER — Encounter: Payer: Self-pay | Admitting: Family Medicine

## 2023-06-27 ENCOUNTER — Telehealth (INDEPENDENT_AMBULATORY_CARE_PROVIDER_SITE_OTHER): Payer: Medicare Other | Admitting: Family Medicine

## 2023-06-27 DIAGNOSIS — U071 COVID-19: Secondary | ICD-10-CM

## 2023-06-27 DIAGNOSIS — J208 Acute bronchitis due to other specified organisms: Secondary | ICD-10-CM

## 2023-06-27 MED ORDER — BENZONATATE 200 MG PO CAPS: 200.0000 mg | ORAL_CAPSULE | Freq: Two times a day (BID) | ORAL | 0 refills | Status: DC

## 2023-06-27 MED ORDER — AMOXICILLIN-POT CLAVULANATE 875-125 MG PO TABS: 1.0000 | ORAL_TABLET | Freq: Two times a day (BID) | ORAL | 0 refills | Status: DC

## 2023-06-27 NOTE — Progress Notes (Signed)
VIRTUAL VISIT VIA VIDEO  I connected with Marcus Taylor on 06/29/23 at  2:40 PM EST by a video enabled telemedicine application and verified that I am speaking with the correct person using two identifiers. Location patient: Home Location provider: [facility], Office Persons participating in the virtual visit: Patient, Dr. Claiborne Billings and Ivonne Andrew, CMA  I discussed the limitations of evaluation and management by telemedicine and the availability of in person appointments. The patient expressed understanding and agreed to proceed.    Marcus Taylor , 02-Nov-1940, 82 y.o., male MRN: [id number] Patient Care Team    Relationship Specialty Notifications Start End  Natalia Leatherwood, DO PCP - General Family Medicine  01/24/22   Louis Meckel, MD (Inactive) Consulting Physician Gastroenterology  01/26/16   Artis Delay, MD Consulting Physician Hematology and Oncology  01/26/16   Naoma Diener, MD Referring Physician Cardiology  01/26/16   Aliene Beams, MD Consulting Physician Neurosurgery  01/27/16   Jill Side, OD Referring Physician   05/03/16    Comment: opthalmology   Valeria Batman, MD (Inactive) Consulting Physician Orthopedic Surgery  06/14/18   Vivi Barrack, DPM Consulting Physician Podiatry  06/14/18     Chief Complaint  Patient presents with   Covid Exposure    Chills,cough, congestion; onset monday     Subjective: Marcus Taylor is a 82 y.o. Pt presents for an OV with complaints of chills, cough, nasal congestion and fatigue of 4-5 days duration.  Associated symptoms include wife recently diagnosed with COVID-19.  Pt has tried Coricidin to ease their symptoms.      11/09/2022    7:58 AM 08/23/2022    8:02 AM 08/17/2021    9:33 AM 07/04/2021    8:53 AM 12/21/2020    9:57 AM  Depression screen PHQ 2/9  Decreased Interest 0 0 0 0 0  Down, Depressed, Hopeless 0 0 0 0 0  PHQ - 2 Score 0 0 0 0 0    No Known Allergies Social History   Social  History Narrative   Married to [place].    HS education. Retired.    Former smoker. No drugs or ETOH.   Wears seatbelt.   Wears dentures.   Smoke detector in the home. Firearms  In the home.    Feels safe in his relationships.    Past Medical History:  Diagnosis Date   Anemia    Arthritis    Cervical radiculopathy 12/22/2019   Colonic polyp    Concussion 03/05/2018   Coronary artery disease    stents x 3 (03-2014)   Diabetes mellitus    Diverticulosis    throughout entire colon   History of colon polyps 01/27/2016   04/2017:  Three sessile polyps were found in the ascending colon. The polyps were 3 mm in size.  These polyps were removed with a cold biopsy forceps. Resection and retrieval were  complete.  - Two sessile polyps were found in the ascending colon. The polyps were 5 to 6 mm in size.  These polyps were removed with a cold snare. Resection and retrieval were complete.  - A 3 mm polyp was found in the    History of total right knee replacement 07/09/2018   HTN (hypertension)    Hypertriglyceridemia    Ingrown toenail 10/21/2019   Kidney stone    Left hamstring injury 11/02/2020   Lymphocytosis    Metatarsal fracture left   left  5th proximal phalanx    Neck pain 02/19/2018   OSA (obstructive sleep apnea)    "Mild" by records   PONV (postoperative nausea and vomiting)    first time, no problem since then   RBBB 01/2016   Right inguinal hernia 02/03/2016   ? 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   Splenomegaly    STEMI (ST elevation myocardial infarction) [facility])    August 2015   Subarachnoid hematoma (HCC) 03/12/2018   Subarachnoid hemorrhage (HCC) 03/05/2018   Past Surgical History:  Procedure Laterality Date   CARDIAC CATHETERIZATION     stents x 3 (03/2014)   CHOLECYSTECTOMY     INGUINAL HERNIA REPAIR Right 05/27/2015   Procedure: LAPAROSCOPIC REPAIR RIGHT  INGUINAL HERNIA;  Surgeon: Gaynelle Adu, MD;  Location:  [facility] ORS;  Service: General;  Laterality: Right;   INSERTION OF MESH Right 05/27/2015   Procedure: INSERTION OF MESH;  Surgeon: Gaynelle Adu, MD;  Location: [facility]S;  Service: General;  Laterality: Right;   LUMBAR DISC SURGERY     TONSILLECTOMY AND ADENOIDECTOMY     TOTAL KNEE ARTHROPLASTY Right 07/09/2018   Procedure: RIGHT TOTAL KNEE ARTHROPLASTY;  Surgeon: Valeria Batman, MD;  Location: [facility] OR;  Service: Orthopedics;  Laterality: Right;   Family History  Problem Relation Age of Onset   Kidney disease Sister    Colon cancer Sister 54   Colon cancer Brother    Bone cancer Brother    Arthritis Mother    Breast cancer Mother    Heart disease Mother    Early death Father    Arthritis Son    Esophageal cancer Sister    Bladder Cancer Sister    Rectal cancer Neg Hx    Stomach cancer Neg Hx    Allergies as of 06/27/2023   No Known Allergies      Medication List        Accurate as of June 27, 2023 11:59 PM. If you have any questions, ask your nurse or doctor.          amLODipine 5 MG tablet Commonly known as: NORVASC Take 1 tablet (5 mg total) by mouth daily.   amoxicillin-clavulanate 875-125 MG tablet Commonly known as: AUGMENTIN Take 1 tablet by mouth 2 (two) times daily. Started by: Felix Pacini   aspirin EC 81 MG tablet Take 81 mg by mouth daily.   atorvastatin 40 MG tablet Commonly known as: LIPITOR Take 1 tablet (40 mg total) by mouth daily.   benzonatate 200 MG capsule Commonly known as: TESSALON Take 1 capsule (200 mg total) by mouth 2 (two) times daily. Started by: Felix Pacini   diclofenac Sodium 1 % Gel Commonly known as: VOLTAREN APPLY 2 GRAMS TO AFFECTED AREA 4 TIMES A DAY   esomeprazole 20 MG capsule Commonly known as: NEXIUM Take 20 mg by mouth every morning.   ferrous sulfate 324 MG Tbec Take 324 mg by mouth daily.   furosemide 20 MG tablet Commonly known as: LASIX Take 0.5-1 tablets (10-20 mg total) by mouth daily as needed.    gabapentin 100 MG capsule Commonly known as: NEURONTIN Take 1-3 capsules (100-300 mg total) by mouth at bedtime.   lisinopril 40 MG tablet Commonly known as: ZESTRIL Take 1 tablet (40 mg total) by mouth daily.   metoprolol tartrate 25 MG tablet Commonly known as: LOPRESSOR Take 0.5 tablets (12.5 mg total) by mouth 2 (two) times daily.   nitroGLYCERIN  0.4 MG SL tablet Commonly known as: NITROSTAT Place 1 tablet (0.4 mg total) under the tongue every 5 (five) minutes as needed for chest pain.        All past medical history, surgical history, allergies, family history, immunizations andmedications were updated in the EMR today and reviewed under the history and medication portions of their EMR.     Review of Systems  Constitutional:  Positive for chills and malaise/fatigue. Negative for fever.  HENT:  Positive for congestion and sinus pain. Negative for ear discharge, ear pain, nosebleeds and sore throat.   Eyes:  Negative for discharge.  Respiratory:  Positive for cough and sputum production. Negative for shortness of breath and wheezing.   Gastrointestinal:  Negative for abdominal pain, diarrhea, nausea and vomiting.  Musculoskeletal:  Negative for myalgias.  Skin:  Negative for rash.  Neurological:  Positive for headaches. Negative for dizziness.   Negative, with the exception of above mentioned in HPI   Objective:  There were no vitals taken for this visit. There is no height or weight on file to calculate BMI.  Physical Exam Vitals and nursing note reviewed.  Constitutional:      General: He is not in acute distress.    Appearance: Normal appearance. He is not toxic-appearing.  HENT:     Head: Normocephalic and atraumatic.     Nose: Congestion present.  Eyes:     General: No scleral icterus.       Right eye: No discharge.        Left eye: No discharge.     Conjunctiva/sclera: Conjunctivae normal.  Pulmonary:     Effort: Pulmonary effort is normal.   Musculoskeletal:     Cervical back: Normal range of motion.  Skin:    Findings: No rash.  Neurological:     Mental Status: He is alert and oriented to person, place, and time. Mental status is at baseline.  Psychiatric:        Mood and Affect: Mood normal.        Behavior: Behavior normal.        Thought Content: Thought content normal.        Judgment: Judgment normal.      No results found. No results found. No results found for this or any previous visit (from the past 24 hour(s)).  Assessment/Plan: Marcus Taylor is a 82 y.o. male present for OV for  COVID-19 bronchitis High risk for complications, outside the window to consider Paxlovid. Rest, hydrate.  +/- flonase, mucinex (DM if cough), nettie pot or nasal saline.  Amoxicillin twice daily prescribed, take until completed.  Tessalon Perles prescribed for cough If cough present it can last up to 6-8 weeks.  F/U 2 weeks of not improved.    Reviewed expectations re: course of current medical issues. Discussed self-management of symptoms. Outlined signs and symptoms indicating need for more acute intervention. Patient verbalized understanding and all questions were answered. Patient received an After-Visit Summary.    No orders of the defined types were placed in this encounter.  Meds ordered this encounter  Medications   amoxicillin-clavulanate (AUGMENTIN) 875-125 MG tablet    Sig: Take 1 tablet by mouth 2 (two) times daily.    Dispense:  20 tablet    Refill:  0   benzonatate (TESSALON) 200 MG capsule    Sig: Take 1 capsule (200 mg total) by mouth 2 (two) times daily.    Dispense:  30 capsule    Refill:  0   Referral Orders  No referral(s) requested today     Note is dictated utilizing voice recognition software. Although note has been proof read prior to signing, occasional typographical errors still can be missed. If any questions arise, please do not hesitate to call for verification.    electronically signed by:  Felix Pacini, DO  [facility] - OR

## 2023-07-10 ENCOUNTER — Ambulatory Visit: Payer: Medicare Other | Admitting: Urgent Care

## 2023-07-10 ENCOUNTER — Other Ambulatory Visit: Payer: Self-pay | Admitting: Urgent Care

## 2023-07-10 ENCOUNTER — Ambulatory Visit (HOSPITAL_BASED_OUTPATIENT_CLINIC_OR_DEPARTMENT_OTHER)
Admission: RE | Admit: 2023-07-10 | Discharge: 2023-07-10 | Disposition: A | Discharge status: Home / Self Care | Payer: Medicare Other | Source: Ambulatory Visit | Attending: Urgent Care | Admitting: Urgent Care

## 2023-07-10 ENCOUNTER — Encounter: Payer: Self-pay | Admitting: Urgent Care

## 2023-07-10 VITALS — BP 129/71 | HR 53 | Wt 173.8 lb

## 2023-07-10 DIAGNOSIS — S96911A Strain of unspecified muscle and tendon at ankle and foot level, right foot, initial encounter: Secondary | ICD-10-CM | POA: Diagnosis not present

## 2023-07-10 DIAGNOSIS — M79671 Pain in right foot: Secondary | ICD-10-CM

## 2023-07-10 DIAGNOSIS — M7989 Other specified soft tissue disorders: Secondary | ICD-10-CM | POA: Diagnosis not present

## 2023-07-10 DIAGNOSIS — M7731 Calcaneal spur, right foot: Secondary | ICD-10-CM | POA: Diagnosis not present

## 2023-07-10 DIAGNOSIS — M19071 Primary osteoarthritis, right ankle and foot: Secondary | ICD-10-CM | POA: Diagnosis not present

## 2023-07-10 MED ORDER — DICLOFENAC SODIUM 1 % EX GEL: 2.0000 g | Freq: Four times a day (QID) | CUTANEOUS | 1 refills | Status: AC

## 2023-07-10 NOTE — Progress Notes (Signed)
Established Patient Office Visit  Subjective:  Patient ID: Marcus Taylor, male    DOB: 1941-06-30  Age: 82 y.o. MRN: [id number]  Chief Complaint  Patient presents with   Foot Swelling    Right foot pain and swelling.    Pleasant 82yo male with a history of diabetes, presented with a two-month history of pain on the top of his foot. The pain was initially localized to the top of the foot, with no pain on the sides. The patient reported a sudden 'pop' sound from the foot while turning in bed on Sunday, followed by immediate swelling and bruising. The patient also reported tingling in the entire foot since the incident. The pain is exacerbated by movement, particularly when flexing the ankle or plantar flexion of the great R toe. The patient reported significant difficulty in walking due to the pain.  The patient has been managing the pain with Tylenol, which he reports as effective. Sitting in office today he is in 0/10 pain. He has also been applying ice to the foot. The patient has been on amoxicillin-clavulanate for a recent COVID-19 infection, but denies any fluoroquinolone use. He also takes furosemide, a diuretic, daily. The patient denies any history of swelling in the foot prior to this incident.  The patient has been under the care of a podiatrist for his foot pain, who had previously recommended diabetic shoes for the patient's foot pain. The patient was unable to secure an appointment with the podiatrist following the recent incident.    Patient Active Problem List   Diagnosis Date Noted   B12 deficiency 04/26/2023   Carpal tunnel syndrome of right wrist 06/14/2022   Type 2 diabetes mellitus with hyperlipidemia (HCC) 07/04/2021   Liver cirrhosis (HCC) 07/25/2018   Pancytopenia, acquired (HCC) 04/19/2018   Primary osteoarthritis of right knee 04/19/2018   Decreased hearing of both ears 03/20/2018   Splenomegaly 03/12/2018   Enlarged prostate without lower urinary tract  symptoms (luts) 03/12/2018   IDA (iron deficiency anemia) 08/09/2016   Thrombocytopenia (HCC) 05/30/2016   History of ST elevation myocardial infarction (STEMI) 01/27/2016   CAD (coronary artery disease) 01/27/2016   Anemia in neoplastic disease 05/25/2014   Splenic marginal zone b-cell lymphoma (HCC) 12/18/2011   HTN (hypertension)    Past Medical History:  Diagnosis Date   Anemia    Arthritis    Cervical radiculopathy 12/22/2019   Colonic polyp    Concussion 03/05/2018   Coronary artery disease    stents x 3 (03-2014)   Diabetes mellitus    Diverticulosis    throughout entire colon   History of colon polyps 01/27/2016   04/2017:  Three sessile polyps were found in the ascending colon. The polyps were 3 mm in size.  These polyps were removed with a cold biopsy forceps. Resection and retrieval were  complete.  - Two sessile polyps were found in the ascending colon. The polyps were 5 to 6 mm in size.  These polyps were removed with a cold snare. Resection and retrieval were complete.  - A 3 mm polyp was found in the    History of total right knee replacement 07/09/2018   HTN (hypertension)    Hypertriglyceridemia    Ingrown toenail 10/21/2019   Kidney stone    Left hamstring injury 11/02/2020   Lymphocytosis    Metatarsal fracture left   left 5th proximal phalanx    Neck pain 02/19/2018   OSA (obstructive sleep apnea)    "Mild" by  records   PONV (postoperative nausea and vomiting)    first time, no problem since then   RBBB 01/2016   Right inguinal hernia 02/03/2016   ? 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   Splenomegaly    STEMI (ST [facility] infarction) [facility])    August 2015   Subarachnoid hematoma (HCC) 03/12/2018   Subarachnoid hemorrhage (HCC) 03/05/2018   Social History   Tobacco Use   Smoking status: Former    Current packs/day: 0.00    Average packs/day: 1 pack/day for 20.0 years (20.0 ttl pk-yrs)     Types: Cigarettes    Start date: 08/14/1968    Quit date: 08/14/1988    Years since quitting: 34.9   Smokeless tobacco: Never  Vaping Use   Vaping status: Never Used  Substance Use Topics   Alcohol use: No   Drug use: No      ROS: as noted in HPI  Objective:     BP 129/71   Pulse (!) 53   Wt 173 lb 12.8 oz (78.8 kg)   SpO2 97%   BMI 26.43 kg/m  BP Readings from Last 3 Encounters:  07/10/23 129/71  04/26/23 (!) 119/52  12/12/22 (!) 134/43   Wt Readings from Last 3 Encounters:  07/10/23 173 lb 12.8 oz (78.8 kg)  04/26/23 165 lb 9.6 oz (75.1 kg)  12/12/22 170 lb 6.4 oz (77.3 kg)      Physical Exam Vitals and nursing note reviewed.  Constitutional:      General: He is not in acute distress.    Appearance: Normal appearance. He is normal weight. He is not ill-appearing, toxic-appearing or diaphoretic.  HENT:     Head: Normocephalic and atraumatic.  Cardiovascular:     Rate and Rhythm: Normal rate.     Pulses: Normal pulses.  Pulmonary:     Effort: Pulmonary effort is normal. No respiratory distress.  Musculoskeletal:        General: Tenderness and signs of injury (bruising noted primarily to medial aspect of R heel) present.     Right lower leg: Edema (1-2+ pitting edema to R foot) present.     Left lower leg: No edema.     Right ankle: Swelling present. No deformity. No tenderness. Normal range of motion (FROM normal however pain with flexion at ankle level). Anterior drawer test negative. Normal pulse.     Right Achilles Tendon: Normal. No tenderness or defects. Thompson's test negative.     Left ankle: Normal. No swelling or deformity. No tenderness. Normal range of motion. Anterior drawer test negative. Normal pulse.     Left Achilles Tendon: Normal. No tenderness or defects. Thompson's test negative.     Right foot: Decreased range of motion (plantar flexion of R great toe absent; normal dorsiflexion). Swelling and tenderness present. No bony tenderness.     Left  foot: Normal. Normal range of motion. No swelling, deformity, tenderness or bony tenderness.       Feet:  Feet:     Right foot:     Skin integrity: Callus present. No skin breakdown, erythema or warmth.     Toenail Condition: Right toenails are abnormally thick. Fungal disease present.    Left foot:     Skin integrity: Skin integrity normal.     Toenail Condition: Left toenails are abnormally thick. Fungal disease present. Neurological:     Mental Status: He is alert.     X-RAY R  FOOT: RIGHT FOOT COMPLETE - 3+ VIEW   COMPARISON:  None Available.   FINDINGS: There is no evidence of fracture or dislocation. Severe degenerative changes seen involving the first metatarsophalangeal joint. Moderate posterior calcaneal spurring is noted. Soft tissues are unremarkable.   IMPRESSION: Severe osteoarthritis of first metatarsophalangeal joint. No acute abnormality seen.     Electronically Signed   By: Lupita Raider M.D.   On: 07/10/2023 11:08   The ASCVD Risk score (Arnett DK, et al., 2019) failed to calculate for the following reasons:   The 2019 ASCVD risk score is only valid for ages 32 to 14   The patient has a prior MI or stroke diagnosis  Assessment & Plan:  Right foot pain -     Diclofenac Sodium; Apply 2 g topically 4 (four) times daily.  Dispense: 100 g; Refill: 1 -     Ambulatory referral to Orthopedic Surgery  Tendon tear, foot, right, initial encounter -     Diclofenac Sodium; Apply 2 g topically 4 (four) times daily.  Dispense: 100 g; Refill: 1 -     Ambulatory referral to Orthopedic Surgery  Foot Pain and Swelling Sudden onset of pain and swelling in the foot following a popping sensation. X-ray showed no fracture or dislocation, but did show severe OA. Significant bruising and inability to flex the big toe suggestive of a possible tendon rupture. -Refer to foot and ankle specialist for further evaluation, including possible ultrasound or MRI to assess soft  tissue status. -Prescribe topical diclofenac for pain and inflammation. -Advise patient to avoid strenuous activity and wear supportive footwear. -Continue Tylenol as needed for pain control.  Edema in Foot New onset of swelling in the foot following the same incident. -Continue furosemide 0.5 tablet daily as currently prescribed. -This will be evaluated further by the foot and ankle specialist.    No follow-ups on file.   Maretta Bees, PA

## 2023-07-10 NOTE — Patient Instructions (Addendum)
Please continue taking tylenol as needed for pain in your foot. Wear your shoes at all times, even in doors, to protect the foot and provide extra support. Avoid excessive use of the foot until seen by the specialist. Ice the foot. Please call [facility] at [phone] to schedule an appointment with the specialist. I have called in topical Voltaren gel for you to rub over the area.  Your xray does not show a fracture or dislocation.

## 2023-07-16 DIAGNOSIS — M25571 Pain in right ankle and joints of right foot: Secondary | ICD-10-CM | POA: Diagnosis not present

## 2023-07-24 ENCOUNTER — Ambulatory Visit (INDEPENDENT_AMBULATORY_CARE_PROVIDER_SITE_OTHER): Payer: Medicare Other

## 2023-07-24 DIAGNOSIS — E538 Deficiency of other specified B group vitamins: Secondary | ICD-10-CM

## 2023-07-24 NOTE — Progress Notes (Signed)
Pt here for monthly B12 injection per [facility]  B12 given IM, and pt tolerated injection well.  Next B12 injection scheduled for 1 month

## 2023-08-03 ENCOUNTER — Ambulatory Visit: Payer: Medicare Other | Admitting: Podiatry

## 2023-08-10 ENCOUNTER — Encounter: Payer: Self-pay | Admitting: Podiatry

## 2023-08-10 ENCOUNTER — Ambulatory Visit: Payer: Medicare Other | Admitting: Podiatry

## 2023-08-10 DIAGNOSIS — M66871 Spontaneous rupture of other tendons, right ankle and foot: Secondary | ICD-10-CM

## 2023-08-10 DIAGNOSIS — D689 Coagulation defect, unspecified: Secondary | ICD-10-CM | POA: Diagnosis not present

## 2023-08-10 DIAGNOSIS — B351 Tinea unguium: Secondary | ICD-10-CM

## 2023-08-10 DIAGNOSIS — M79676 Pain in unspecified toe(s): Secondary | ICD-10-CM

## 2023-08-10 NOTE — Progress Notes (Unsigned)
°  Chief Complaint  Patient presents with   [facility]    RM#[id number] DFC  A1C 5.7 04/26/2023    Subjective:  82 y.o. returns the office today for painful, elongated, thickened toenails which he cannot trim himself. Went to ortho for rupture of extensor tenon on right.  States that he has difficulty bringing his foot up to walk on the right side.  PCP: Felix Pacini A, DO Last seen 04/26/2023 Last A1c: 5.8 on 04/26/2023  Objective: AAO 3, NAD DP/PT pulses palpable, CRT less than 3 seconds  Nails hypertrophic, dystrophic, elongated, brittle, discolored 10. There is tenderness overlying the nails 1-5 bilaterally. There is no surrounding erythema or drainage along the nail sites. There is to be a rupture of the extensor tendon on the right foot on the EHL.  He is not able to dorsiflex his hallux.  Not this present on the anterior aspect ankle likely from but the tendon retracted. No pain with calf compression, swelling, warmth, erythema. Contracture digits is noted.  No pinpoint area of discomfort dorsally on either foot.   Assessment: Patient presents with symptomatic onychomycosis; neuropathic foot pain; consider tendon rupture  Plan: -Treatment options including alternatives, risks, complications were discussed -Nails sharply debrided 10 without complication/bleeding. -Regards to his extensor tendon rupture we discussed both conservative as well as surgical options.  Given how far prior the retraction is clinically I do not think we will be able to repair this.  Consider advanced imaging or start physical therapy and if no improvement consider surgical intervention. -Discussed daily foot inspection. If there are any changes, to call the office immediately.  -Follow-up in 3 months or sooner if any problems are to arise. In the meantime, encouraged to call the office with any questions, concerns, changes symptoms.  Return in about 3 months (around 11/08/2023).  Vivi Barrack DPM

## 2023-08-17 ENCOUNTER — Ambulatory Visit (INDEPENDENT_AMBULATORY_CARE_PROVIDER_SITE_OTHER): Payer: Medicare Other | Admitting: Family Medicine

## 2023-08-17 ENCOUNTER — Ambulatory Visit: Payer: Medicare Other | Admitting: Family Medicine

## 2023-08-17 VITALS — BP 120/64 | HR 65 | Temp 98.0°F | Ht 70.0 in | Wt 175.4 lb

## 2023-08-17 DIAGNOSIS — H6122 Impacted cerumen, left ear: Secondary | ICD-10-CM | POA: Diagnosis not present

## 2023-08-17 NOTE — Patient Instructions (Addendum)

## 2023-08-17 NOTE — Progress Notes (Signed)
 Marcus Taylor , 03-25-1941, 83 y.o., male MRN: [id number] Patient Care Team    Relationship Specialty Notifications Start End  Catherine Charlies LABOR, DO PCP - General Family Medicine  01/24/22   Debrah Lamar BIRCH, MD (Inactive) Consulting Physician Gastroenterology  01/26/16   Lonn Hicks, MD Consulting Physician Hematology and Oncology  01/26/16   Maryelizabeth Lenis, MD Referring Physician Cardiology  01/26/16   Carles Gear, MD Consulting Physician Neurosurgery  01/27/16   Elspeth Lauraine DEL, OD Referring Physician   05/03/16    Comment: opthalmology   Anderson Maude LELON, MD (Inactive) Consulting Physician Orthopedic Surgery  06/14/18   Gershon Donnice SAUNDERS, DPM Consulting Physician Podiatry  06/14/18     Chief Complaint  Patient presents with   Ear Fullness    Pt states can't hear and ears feels impacted.      Subjective: Marcus Taylor is a 83 y.o. Pt presents for an OV with complaints of LEFT ear feeling clogged. He has experienced decreased hearing in his ears. His wife has noticed he has the TV up louder.      11/09/2022    7:58 AM 08/23/2022    8:02 AM 08/17/2021    9:33 AM 07/04/2021    8:53 AM 12/21/2020    9:57 AM  Depression screen PHQ 2/9  Decreased Interest 0 0 0 0 0  Down, Depressed, Hopeless 0 0 0 0 0  PHQ - 2 Score 0 0 0 0 0    No Known Allergies Social History   Social History Narrative   Married to [place].    HS education. Retired.    Former smoker. No drugs or ETOH.   Wears seatbelt.   Wears dentures.   Smoke detector in the home. Firearms  In the home.    Feels safe in his relationships.    Past Medical History:  Diagnosis Date   Anemia    Arthritis    Cervical radiculopathy 12/22/2019   Colonic polyp    Concussion 03/05/2018   Coronary artery disease    stents x 3 (03-2014)   Diabetes mellitus    Diverticulosis    throughout entire colon   History of colon polyps 01/27/2016   04/2017:  Three sessile polyps were found in the ascending colon. The  polyps were 3 mm in size.  These polyps were removed with a cold biopsy forceps. Resection and retrieval were  complete.  - Two sessile polyps were found in the ascending colon. The polyps were 5 to 6 mm in size.  These polyps were removed with a cold snare. Resection and retrieval were complete.  - A 3 mm polyp was found in the    History of total right knee replacement 07/09/2018   HTN (hypertension)    Hypertriglyceridemia    Ingrown toenail 10/21/2019   Kidney stone    Left hamstring injury 11/02/2020   Lymphocytosis    Metatarsal fracture left   left 5th proximal phalanx    Neck pain 02/19/2018   OSA (obstructive sleep apnea)    Mild by records   PONV (postoperative nausea and vomiting)    first time, no problem since then   RBBB 01/2016   Right inguinal hernia 02/03/2016   ? 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   Splenomegaly    STEMI (ST elevation myocardial infarction) [facility])    August 2015   Subarachnoid hematoma (HCC) 03/12/2018  Subarachnoid hemorrhage (HCC) 03/05/2018   Past Surgical History:  Procedure Laterality Date   CARDIAC CATHETERIZATION     stents x 3 (03/2014)   CHOLECYSTECTOMY     INGUINAL HERNIA REPAIR Right 05/27/2015   Procedure: LAPAROSCOPIC REPAIR RIGHT  INGUINAL HERNIA;  Surgeon: Camellia Blush, MD;  Location: [facility];  Service: General;  Laterality: Right;   INSERTION OF MESH Right 05/27/2015   Procedure: INSERTION OF MESH;  Surgeon: Camellia Blush, MD;  Location: [facility];  Service: General;  Laterality: Right;   LUMBAR DISC SURGERY     TONSILLECTOMY AND ADENOIDECTOMY     TOTAL KNEE ARTHROPLASTY Right 07/09/2018   Procedure: RIGHT TOTAL KNEE ARTHROPLASTY;  Surgeon: Anderson Maude ORN, MD;  Location: [facility] OR;  Service: Orthopedics;  Laterality: Right;   Family History  Problem Relation Age of Onset   Kidney disease Sister    Colon cancer Sister 33   Colon cancer Brother    Bone cancer Brother     Arthritis Mother    Breast cancer Mother    Heart disease Mother    Early death Father    Arthritis Son    Esophageal cancer Sister    Bladder Cancer Sister    Rectal cancer Neg Hx    Stomach cancer Neg Hx    Allergies as of 08/17/2023   No Known Allergies      Medication List        Accurate as of August 17, 2023 11:26 AM. If you have any questions, ask your nurse or doctor.          amLODipine  5 MG tablet Commonly known as: NORVASC  Take 1 tablet (5 mg total) by mouth daily.   amoxicillin -clavulanate 875-125 MG tablet Commonly known as: AUGMENTIN  Take 1 tablet by mouth 2 (two) times daily.   aspirin  EC 81 MG tablet Take 81 mg by mouth daily.   atorvastatin  40 MG tablet Commonly known as: LIPITOR Take 1 tablet (40 mg total) by mouth daily.   benzonatate  200 MG capsule Commonly known as: TESSALON  Take 1 capsule (200 mg total) by mouth 2 (two) times daily.   diclofenac  Sodium 1 % Gel Commonly known as: VOLTAREN  Apply 2 g topically 4 (four) times daily.   esomeprazole 20 MG capsule Commonly known as: NEXIUM Take 20 mg by mouth every morning.   ferrous sulfate  324 MG Tbec Take 324 mg by mouth daily.   furosemide  20 MG tablet Commonly known as: LASIX  Take 0.5-1 tablets (10-20 mg total) by mouth daily as needed.   gabapentin  100 MG capsule Commonly known as: NEURONTIN  Take 1-3 capsules (100-300 mg total) by mouth at bedtime.   lisinopril  40 MG tablet Commonly known as: ZESTRIL  Take 1 tablet (40 mg total) by mouth daily.   metoprolol  tartrate 25 MG tablet Commonly known as: LOPRESSOR  Take 0.5 tablets (12.5 mg total) by mouth 2 (two) times daily.   nitroGLYCERIN  0.4 MG SL tablet Commonly known as: NITROSTAT  Place 1 tablet (0.4 mg total) under the tongue every 5 (five) minutes as needed for chest pain.        All past medical history, surgical history, allergies, family history, immunizations andmedications were updated in the EMR today and reviewed  under the history and medication portions of their EMR.     ROS Negative, with the exception of above mentioned in HPI   Objective:  BP 120/64   Pulse 65   Temp 98 F (36.7 C)   Ht 5' 10 (1.778 m)  Wt 175 lb 6.4 oz (79.6 kg)   SpO2 98%   BMI 25.17 kg/m  Body mass index is 25.17 kg/m.  Physical Exam Vitals and nursing note reviewed.  Constitutional:      General: He is not in acute distress.    Appearance: Normal appearance. He is not ill-appearing, toxic-appearing or diaphoretic.  HENT:     Head: Normocephalic and atraumatic.     Right Ear: Tympanic membrane, ear canal and external ear normal. There is no impacted cerumen.     Left Ear: There is impacted cerumen.  Eyes:     General: No scleral icterus.       Right eye: No discharge.        Left eye: No discharge.     Extraocular Movements: Extraocular movements intact.     Pupils: Pupils are equal, round, and reactive to light.  Skin:    General: Skin is warm and dry.     Coloration: Skin is not jaundiced or pale.     Findings: No rash.  Neurological:     Mental Status: He is alert and oriented to person, place, and time. Mental status is at baseline.  Psychiatric:        Mood and Affect: Mood normal.        Behavior: Behavior normal.        Thought Content: Thought content normal.        Judgment: Judgment normal.     No results found. No results found. No results found for this or any previous visit (from the past 24 hours).  Assessment/Plan: Marcus Taylor is a 83 y.o. male present for OV for  Hearing loss due to cerumen impaction, left (Primary) Procedure: Cerumen disimpaction Patient was verbally consented to procedure. Water-peroxide solution was applied and gentle ear lavage performed on LEFT ear(s).  There were no complications.  Tympanic membrane was visualized after disimpaction.  Tympanic membrane(s) intact.  Auditory canal(s) WNL. Patient tolerated procedure well.  Patient reported relief of  symptoms after removal of cerumen.   Reviewed expectations re: course of current medical issues. Discussed self-management of symptoms. Outlined signs and symptoms indicating need for more acute intervention. Patient verbalized understanding and all questions were answered. Patient received an After-Visit Summary.    No orders of the defined types were placed in this encounter.  No orders of the defined types were placed in this encounter.  Referral Orders  No referral(s) requested today     Note is dictated utilizing voice recognition software. Although note has been proof read prior to signing, occasional typographical errors still can be missed. If any questions arise, please do not hesitate to call for verification.   electronically signed by:  Charlies Bellini, DO  [facility] - [place]

## 2023-08-21 ENCOUNTER — Ambulatory Visit: Payer: Medicare Other | Admitting: Family Medicine

## 2023-08-21 ENCOUNTER — Encounter: Payer: Self-pay | Admitting: Family Medicine

## 2023-08-21 ENCOUNTER — Ambulatory Visit: Payer: Medicare Other

## 2023-08-21 VITALS — BP 128/62 | HR 72 | Temp 97.7°F | Wt 174.6 lb

## 2023-08-21 DIAGNOSIS — H9193 Unspecified hearing loss, bilateral: Secondary | ICD-10-CM | POA: Diagnosis not present

## 2023-08-21 DIAGNOSIS — B9689 Other specified bacterial agents as the cause of diseases classified elsewhere: Secondary | ICD-10-CM

## 2023-08-21 DIAGNOSIS — J329 Chronic sinusitis, unspecified: Secondary | ICD-10-CM | POA: Diagnosis not present

## 2023-08-21 MED ORDER — CEFDINIR 300 MG PO CAPS: 300.0000 mg | ORAL_CAPSULE | Freq: Two times a day (BID) | ORAL | 0 refills | Status: DC

## 2023-08-21 MED ORDER — FLUTICASONE PROPIONATE 50 MCG/ACT NA SUSP: 1.0000 | Freq: Two times a day (BID) | NASAL | 6 refills | Status: DC

## 2023-08-21 MED ORDER — PREDNISONE 20 MG PO TABS: ORAL_TABLET | ORAL | 0 refills | Status: DC

## 2023-08-21 NOTE — Patient Instructions (Signed)

## 2023-08-21 NOTE — Progress Notes (Signed)
 Marcus Taylor , 08-17-1940, 83 y.o., male MRN: [id number] Patient Care Team    Relationship Specialty Notifications Start End  Catherine Charlies LABOR, DO PCP - General Family Medicine  01/24/22   Debrah Lamar BIRCH, MD (Inactive) Consulting Physician Gastroenterology  01/26/16   Lonn Hicks, MD Consulting Physician Hematology and Oncology  01/26/16   Maryelizabeth Lenis, MD Referring Physician Cardiology  01/26/16   Carles Gear, MD Consulting Physician Neurosurgery  01/27/16   Elspeth Lauraine DEL, OD Referring Physician   05/03/16    Comment: opthalmology   Anderson Maude LELON, MD (Inactive) Consulting Physician Orthopedic Surgery  06/14/18   Gershon Donnice SAUNDERS, DPM Consulting Physician Podiatry  06/14/18     Chief Complaint  Patient presents with   Hearing Problem    Started around christmas     Subjective: Marcus Taylor is a 83 y.o. Pt presents for an OV with complaints of decreased hearing from his left ear.  Patient was seen 5 days ago with cerumen impaction of left ear.  Gentle ear lavage was completed and successful.  Exam was completed after cerumen removal and tympanic membrane was intact, erythema was not present and normal ear canal present.  Today he presents for concerns of continued decreased hearing despite clearance of cerumen.  He feels hearing was reduced over Christmas time. He also endorses head congestion started at that time as well.       11/09/2022    7:58 AM 08/23/2022    8:02 AM 08/17/2021    9:33 AM 07/04/2021    8:53 AM 12/21/2020    9:57 AM  Depression screen PHQ 2/9  Decreased Interest 0 0 0 0 0  Down, Depressed, Hopeless 0 0 0 0 0  PHQ - 2 Score 0 0 0 0 0    No Known Allergies Social History   Social History Narrative   Married to [place].    HS education. Retired.    Former smoker. No drugs or ETOH.   Wears seatbelt.   Wears dentures.   Smoke detector in the home. Firearms  In the home.    Feels safe in his relationships.    Past Medical History:   Diagnosis Date   Anemia    Arthritis    Cervical radiculopathy 12/22/2019   Colonic polyp    Concussion 03/05/2018   Coronary artery disease    stents x 3 (03-2014)   Diabetes mellitus    Diverticulosis    throughout entire colon   History of colon polyps 01/27/2016   04/2017:  Three sessile polyps were found in the ascending colon. The polyps were 3 mm in size.  These polyps were removed with a cold biopsy forceps. Resection and retrieval were  complete.  - Two sessile polyps were found in the ascending colon. The polyps were 5 to 6 mm in size.  These polyps were removed with a cold snare. Resection and retrieval were complete.  - A 3 mm polyp was found in the    History of total right knee replacement 07/09/2018   HTN (hypertension)    Hypertriglyceridemia    Ingrown toenail 10/21/2019   Kidney stone    Left hamstring injury 11/02/2020   Lymphocytosis    Metatarsal fracture left   left 5th proximal phalanx    Neck pain 02/19/2018   OSA (obstructive sleep apnea)    Mild by records   PONV (postoperative nausea and vomiting)    first time, no problem  since then   RBBB 01/2016   Right inguinal hernia 02/03/2016   ? 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   Splenomegaly    STEMI (ST elevation myocardial infarction) [facility])    August 2015   Subarachnoid hematoma (HCC) 03/12/2018   Subarachnoid hemorrhage (HCC) 03/05/2018   Past Surgical History:  Procedure Laterality Date   CARDIAC CATHETERIZATION     stents x 3 (03/2014)   CHOLECYSTECTOMY     INGUINAL HERNIA REPAIR Right 05/27/2015   Procedure: LAPAROSCOPIC REPAIR RIGHT  INGUINAL HERNIA;  Surgeon: Camellia Blush, MD;  Location: [facility];  Service: General;  Laterality: Right;   INSERTION OF MESH Right 05/27/2015   Procedure: INSERTION OF MESH;  Surgeon: Camellia Blush, MD;  Location: [facility];  Service: General;  Laterality: Right;   LUMBAR DISC SURGERY     TONSILLECTOMY AND  ADENOIDECTOMY     TOTAL KNEE ARTHROPLASTY Right 07/09/2018   Procedure: RIGHT TOTAL KNEE ARTHROPLASTY;  Surgeon: Anderson Maude ORN, MD;  Location: [facility];  Service: Orthopedics;  Laterality: Right;   Family History  Problem Relation Age of Onset   Kidney disease Sister    Colon cancer Sister 83   Colon cancer Brother    Bone cancer Brother    Arthritis Mother    Breast cancer Mother    Heart disease Mother    Early death Father    Arthritis Son    Esophageal cancer Sister    Bladder Cancer Sister    Rectal cancer Neg Hx    Stomach cancer Neg Hx    Allergies as of 08/21/2023   No Known Allergies      Medication List        Accurate as of August 21, 2023  2:34 PM. If you have any questions, ask your nurse or doctor.          STOP taking these medications    amoxicillin -clavulanate 875-125 MG tablet Commonly known as: AUGMENTIN  Stopped by: Charlies Bellini   benzonatate  200 MG capsule Commonly known as: TESSALON  Stopped by: Charlies Bellini       TAKE these medications    amLODipine  5 MG tablet Commonly known as: NORVASC  Take 1 tablet (5 mg total) by mouth daily.   aspirin  EC 81 MG tablet Take 81 mg by mouth daily.   atorvastatin  40 MG tablet Commonly known as: LIPITOR Take 1 tablet (40 mg total) by mouth daily.   cefdinir  300 MG capsule Commonly known as: OMNICEF  Take 1 capsule (300 mg total) by mouth 2 (two) times daily. Started by: Nathaneil Feagans   diclofenac  Sodium 1 % Gel Commonly known as: VOLTAREN  Apply 2 g topically 4 (four) times daily.   esomeprazole 20 MG capsule Commonly known as: NEXIUM Take 20 mg by mouth every morning.   ferrous sulfate  324 MG Tbec Take 324 mg by mouth daily.   fluticasone  50 MCG/ACT nasal spray Commonly known as: FLONASE  Place 1 spray into both nostrils in the morning and at bedtime. Started by: Charlies Bellini   furosemide  20 MG tablet Commonly known as: LASIX  Take 0.5-1 tablets (10-20 mg total) by mouth daily as  needed.   gabapentin  100 MG capsule Commonly known as: NEURONTIN  Take 1-3 capsules (100-300 mg total) by mouth at bedtime.   lisinopril  40 MG tablet Commonly known as: ZESTRIL  Take 1 tablet (40 mg total) by mouth daily.   metoprolol  tartrate 25 MG tablet Commonly known as: LOPRESSOR  Take 0.5  tablets (12.5 mg total) by mouth 2 (two) times daily.   nitroGLYCERIN  0.4 MG SL tablet Commonly known as: NITROSTAT  Place 1 tablet (0.4 mg total) under the tongue every 5 (five) minutes as needed for chest pain.   predniSONE  20 MG tablet Commonly known as: DELTASONE  60 mg x3d, 40 mg x3d, 20 mg x2d, 10 mg x2d Started by: Charlies Bellini        All past medical history, surgical history, allergies, family history, immunizations andmedications were updated in the EMR today and reviewed under the history and medication portions of their EMR.     ROS Negative, with the exception of above mentioned in HPI   Objective:  BP 128/62   Pulse 72   Temp 97.7 F (36.5 C)   Wt 174 lb 9.6 oz (79.2 kg)   SpO2 95%   BMI 25.05 kg/m  Body mass index is 25.05 kg/m. Physical Exam Vitals and nursing note reviewed.  Constitutional:      General: He is not in acute distress.    Appearance: Normal appearance. He is not ill-appearing, toxic-appearing or diaphoretic.  HENT:     Head: Normocephalic and atraumatic.     Right Ear: Tympanic membrane and ear canal normal. There is no impacted cerumen.     Left Ear: Tympanic membrane and ear canal normal. There is no impacted cerumen.     Nose: Congestion and rhinorrhea present.     Right Sinus: Frontal sinus tenderness present.     Left Sinus: Frontal sinus tenderness present.     Comments: Postnasal drip present    Mouth/Throat:     Mouth: Mucous membranes are moist.     Pharynx: No oropharyngeal exudate or posterior oropharyngeal erythema.  Eyes:     General: No scleral icterus.       Right eye: No discharge.        Left eye: No discharge.      Extraocular Movements: Extraocular movements intact.     Pupils: Pupils are equal, round, and reactive to light.  Skin:    General: Skin is warm and dry.     Coloration: Skin is not jaundiced or pale.     Findings: No rash.  Neurological:     Mental Status: He is alert and oriented to person, place, and time. Mental status is at baseline.  Psychiatric:        Mood and Affect: Mood normal.        Behavior: Behavior normal.        Thought Content: Thought content normal.        Judgment: Judgment normal.      Hearing Screening   500Hz  1000Hz  2000Hz  4000Hz   Right ear 30 60 85 90  Left ear 30 100 75 80   No results found. No results found for this or any previous visit (from the past 24 hours).  Assessment/Plan: Marcus Taylor is a 83 y.o. male present for OV for  Decreased hearing of both ears (Primary) Rather severe hearing loss present in his left ear and moderate hearing loss of his right ear.  We discussed audiology referral today and the likelihood of needing hearing aids.  He is agreeable to referral today. - Ambulatory referral to Audiology  Bacterial sinusitis He does have evidence of a mild sinus infection with congestion, frontal sinus tenderness today.  I do not believe his decreased hearing can be slightly contributed to his inflamed sinuses, but it may be contributing enough that he recognized  the decrease in hearing. Rest and hydrate. Prednisone  taper prescribed Omnicef  twice daily prescribed. Flonase  nasal spray twice daily prescribed. Follow-up as needed   Reviewed expectations re: course of current medical issues. Discussed self-management of symptoms. Outlined signs and symptoms indicating need for more acute intervention. Patient verbalized understanding and all questions were answered. Patient received an After-Visit Summary.    Orders Placed This Encounter  Procedures   Ambulatory referral to Audiology   Meds ordered this encounter  Medications    predniSONE  (DELTASONE ) 20 MG tablet    Sig: 60 mg x3d, 40 mg x3d, 20 mg x2d, 10 mg x2d    Dispense:  18 tablet    Refill:  0   cefdinir  (OMNICEF ) 300 MG capsule    Sig: Take 1 capsule (300 mg total) by mouth 2 (two) times daily.    Dispense:  20 capsule    Refill:  0   fluticasone  (FLONASE ) 50 MCG/ACT nasal spray    Sig: Place 1 spray into both nostrils in the morning and at bedtime.    Dispense:  16 g    Refill:  6   Referral Orders         Ambulatory referral to Audiology       Note is dictated utilizing voice recognition software. Although note has been proof read prior to signing, occasional typographical errors still can be missed. If any questions arise, please do not hesitate to call for verification.   electronically signed by:  Charlies Bellini, DO   [facility] - [place]

## 2023-08-27 ENCOUNTER — Ambulatory Visit: Payer: Medicare Other | Admitting: Family Medicine

## 2023-09-05 ENCOUNTER — Ambulatory Visit: Payer: Medicare Other | Admitting: *Deleted

## 2023-09-05 DIAGNOSIS — Z0000 Encounter for general adult medical examination without abnormal findings: Secondary | ICD-10-CM

## 2023-09-05 NOTE — Patient Instructions (Signed)
Marcus Taylor , Thank you for taking time to come for your Medicare Wellness Visit. I appreciate your ongoing commitment to your health goals. Please review the following plan we discussed and let me know if I can assist you in the future.   Screening recommendations/referrals: Colonoscopy: no longer required Recommended yearly ophthalmology/optometry visit for glaucoma screening and checkup Recommended yearly dental visit for hygiene and checkup  Vaccinations: Influenza vaccine: up to date Pneumococcal vaccine: up to date Tdap vaccine: up to date Shingles vaccine: up to date    Advanced directives: Education provid   Preventive Care 65 Years and Older, Male Preventive care refers to lifestyle choices and visits with your health care provider that can promote health and wellness. What does preventive care include? A yearly physical exam. This is also called an annual well check. Dental exams once or twice a year. Routine eye exams. Ask your health care provider how often you should have your eyes checked. Personal lifestyle choices, including: Daily care of your teeth and gums. Regular physical activity. Eating a healthy diet. Avoiding tobacco and drug use. Limiting alcohol use. Practicing safe sex. Taking low doses of aspirin every day. Taking vitamin and mineral supplements as recommended by your health care provider. What happens during an annual well check? The services and screenings done by your health care provider during your annual well check will depend on your age, overall health, lifestyle risk factors, and family history of disease. Counseling  Your health care provider may ask you questions about your: Alcohol use. Tobacco use. Drug use. Emotional well-being. Home and relationship well-being. Sexual activity. Eating habits. History of falls. Memory and ability to understand (cognition). Work and work Astronomer. Screening  You may have the following tests  or measurements: Height, weight, and BMI. Blood pressure. Lipid and cholesterol levels. These may be checked every 5 years, or more frequently if you are over 26 years old. Skin check. Lung cancer screening. You may have this screening every year starting at age 50 if you have a 30-pack-year history of smoking and currently smoke or have quit within the past 15 years. Fecal occult blood test (FOBT) of the stool. You may have this test every year starting at age 52. Flexible sigmoidoscopy or colonoscopy. You may have a sigmoidoscopy every 5 years or a colonoscopy every 10 years starting at age 55. Prostate cancer screening. Recommendations will vary depending on your family history and other risks. Hepatitis C blood test. Hepatitis B blood test. Sexually transmitted disease (STD) testing. Diabetes screening. This is done by checking your blood sugar (glucose) after you have not eaten for a while (fasting). You may have this done every 1-3 years. Abdominal aortic aneurysm (AAA) screening. You may need this if you are a current or former smoker. Osteoporosis. You may be screened starting at age 73 if you are at high risk. Talk with your health care provider about your test results, treatment options, and if necessary, the need for more tests. Vaccines  Your health care provider may recommend certain vaccines, such as: Influenza vaccine. This is recommended every year. Tetanus, diphtheria, and acellular pertussis (Tdap, Td) vaccine. You may need a Td booster every 10 years. Zoster vaccine. You may need this after age 69. Pneumococcal 13-valent conjugate (PCV13) vaccine. One dose is recommended after age 95. Pneumococcal polysaccharide (PPSV23) vaccine. One dose is recommended after age 33. Talk to your health care provider about which screenings and vaccines you need and how often you need them. This  information is not intended to replace advice given to you by your health care provider. Make  sure you discuss any questions you have with your health care provider. Document Released: 08/27/2015 Document Revised: 04/19/2016 Document Reviewed: 06/01/2015 Elsevier Interactive Patient Education  2017 [employer].  Fall Prevention in the Home Falls can cause injuries. They can happen to people of all ages. There are many things you can do to make your home safe and to help prevent falls. What can I do on the outside of my home? Regularly fix the edges of walkways and driveways and fix any cracks. Remove anything that might make you trip as you walk through a door, such as a raised step or threshold. Trim any bushes or trees on the path to your home. Use bright outdoor lighting. Clear any walking paths of anything that might make someone trip, such as rocks or tools. Regularly check to see if handrails are loose or broken. Make sure that both sides of any steps have handrails. Any raised decks and porches should have guardrails on the edges. Have any leaves, snow, or ice cleared regularly. Use sand or salt on walking paths during winter. Clean up any spills in your garage right away. This includes oil or grease spills. What can I do in the bathroom? Use night lights. Install grab bars by the toilet and in the tub and shower. Do not use towel bars as grab bars. Use non-skid mats or decals in the tub or shower. If you need to sit down in the shower, use a plastic, non-slip stool. Keep the floor dry. Clean up any water that spills on the floor as soon as it happens. Remove soap buildup in the tub or shower regularly. Attach bath mats securely with double-sided non-slip rug tape. Do not have throw rugs and other things on the floor that can make you trip. What can I do in the bedroom? Use night lights. Make sure that you have a light by your bed that is easy to reach. Do not use any sheets or blankets that are too big for your bed. They should not hang down onto the floor. Have a firm  chair that has side arms. You can use this for support while you get dressed. Do not have throw rugs and other things on the floor that can make you trip. What can I do in the kitchen? Clean up any spills right away. Avoid walking on wet floors. Keep items that you use a lot in easy-to-reach places. If you need to reach something above you, use a strong step stool that has a grab bar. Keep electrical cords out of the way. Do not use floor polish or wax that makes floors slippery. If you must use wax, use non-skid floor wax. Do not have throw rugs and other things on the floor that can make you trip. What can I do with my stairs? Do not leave any items on the stairs. Make sure that there are handrails on both sides of the stairs and use them. Fix handrails that are broken or loose. Make sure that handrails are as long as the stairways. Check any carpeting to make sure that it is firmly attached to the stairs. Fix any carpet that is loose or worn. Avoid having throw rugs at the top or bottom of the stairs. If you do have throw rugs, attach them to the floor with carpet tape. Make sure that you have a light switch at the top  of the stairs and the bottom of the stairs. If you do not have them, ask someone to add them for you. What else can I do to help prevent falls? Wear shoes that: Do not have high heels. Have rubber bottoms. Are comfortable and fit you well. Are closed at the toe. Do not wear sandals. If you use a stepladder: Make sure that it is fully opened. Do not climb a closed stepladder. Make sure that both sides of the stepladder are locked into place. Ask someone to hold it for you, if possible. Clearly mark and make sure that you can see: Any grab bars or handrails. First and last steps. Where the edge of each step is. Use tools that help you move around (mobility aids) if they are needed. These include: Canes. Walkers. Scooters. Crutches. Turn on the lights when you go  into a dark area. Replace any light bulbs as soon as they burn out. Set up your furniture so you have a clear path. Avoid moving your furniture around. If any of your floors are uneven, fix them. If there are any pets around you, be aware of where they are. Review your medicines with your doctor. Some medicines can make you feel dizzy. This can increase your chance of falling. Ask your doctor what other things that you can do to help prevent falls. This information is not intended to replace advice given to you by your health care provider. Make sure you discuss any questions you have with your health care provider. Document Released: 05/27/2009 Document Revised: 01/06/2016 Document Reviewed: 09/04/2014 Elsevier Interactive Patient Education  2017 [employer].

## 2023-09-05 NOTE — Progress Notes (Signed)
Subjective:   Marcus Taylor is a 83 y.o. male who presents for Medicare Annual/Subsequent preventive examination.  Visit Complete: Virtual I connected with  Stephanie Coup on 09/05/23 by a audio enabled telemedicine application and verified that I am speaking with the correct person using two identifiers.  Patient Location: Home  Provider Location: Home Office  I discussed the limitations of evaluation and management by telemedicine. The patient expressed understanding and agreed to proceed.  Vital Signs: Because this visit was a virtual/telehealth visit, some criteria may be missing or patient reported. Any vitals not documented were not able to be obtained and vitals that have been documented are patient report  Unable to connect to video  Cardiac Risk Factors include: advanced age (>105men, >71 women);diabetes mellitus;hypertension;male gender;family history of premature cardiovascular disease     Objective:    There were no vitals filed for this visit. There is no height or weight on file to calculate BMI.     08/23/2022    8:05 AM 08/17/2021    9:34 AM 08/11/2020    1:35 PM 01/09/2020    8:16 AM 01/02/2020    8:16 AM 09/11/2019    2:48 PM 07/09/2018    8:56 AM  Advanced Directives  Does Patient Have a Medical Advance Directive? Yes Yes Yes Yes Yes Yes No  Type of Estate agent of [place];Living will Healthcare Power of [employer] of [place];Living will Healthcare Power of [place];Living will Healthcare Power of [place];Living will Healthcare Power of [place];Living will   Does patient want to make changes to medical advance directive? No - Patient declined        Copy of Healthcare Power of Attorney in Chart? No - copy requested No - copy requested No - copy requested No - copy requested No - copy requested No - copy requested   Would patient like information on creating a medical advance directive?       No - Patient declined     Current Medications (verified) Outpatient Encounter Medications as of 09/05/2023  Medication Sig   amLODipine (NORVASC) 5 MG tablet Take 1 tablet (5 mg total) by mouth daily.   aspirin EC 81 MG tablet Take 81 mg by mouth daily.   atorvastatin (LIPITOR) 40 MG tablet Take 1 tablet (40 mg total) by mouth daily.   diclofenac Sodium (VOLTAREN) 1 % GEL Apply 2 g topically 4 (four) times daily.   esomeprazole (NEXIUM) 20 MG capsule Take 20 mg by mouth every morning.   ferrous sulfate 324 MG TBEC Take 324 mg by mouth daily.   fluticasone (FLONASE) 50 MCG/ACT nasal spray Place 1 spray into both nostrils in the morning and at bedtime.   furosemide (LASIX) 20 MG tablet Take 0.5-1 tablets (10-20 mg total) by mouth daily as needed.   lisinopril (ZESTRIL) 40 MG tablet Take 1 tablet (40 mg total) by mouth daily.   metoprolol tartrate (LOPRESSOR) 25 MG tablet Take 0.5 tablets (12.5 mg total) by mouth 2 (two) times daily.   nitroGLYCERIN (NITROSTAT) 0.4 MG SL tablet Place 1 tablet (0.4 mg total) under the tongue every 5 (five) minutes as needed for chest pain.   cefdinir (OMNICEF) 300 MG capsule Take 1 capsule (300 mg total) by mouth 2 (two) times daily.   gabapentin (NEURONTIN) 100 MG capsule Take 1-3 capsules (100-300 mg total) by mouth at bedtime.   predniSONE (DELTASONE) 20 MG tablet 60 mg x3d, 40 mg x3d, 20 mg x2d, 10 mg x2d (  Patient not taking: Reported on 09/05/2023)   Facility-Administered Encounter Medications as of 09/05/2023  Medication   cyanocobalamin (VITAMIN B12) injection 1,000 mcg    Allergies (verified) Patient has no known allergies.   History: Past Medical History:  Diagnosis Date   Anemia    Arthritis    Cervical radiculopathy 12/22/2019   Colonic polyp    Concussion 03/05/2018   Coronary artery disease    stents x 3 (03-2014)   Diabetes mellitus    Diverticulosis    throughout entire colon   History of colon polyps 01/27/2016   04/2017:  Three sessile polyps were found  in the ascending colon. The polyps were 3 mm in size.  These polyps were removed with a cold biopsy forceps. Resection and retrieval were  complete.  - Two sessile polyps were found in the ascending colon. The polyps were 5 to 6 mm in size.  These polyps were removed with a cold snare. Resection and retrieval were complete.  - A 3 mm polyp was found in the    History of total right knee replacement 07/09/2018   HTN (hypertension)    Hypertriglyceridemia    Ingrown toenail 10/21/2019   Kidney stone    Left hamstring injury 11/02/2020   Lymphocytosis    Metatarsal fracture left   left 5th proximal phalanx    Neck pain 02/19/2018   OSA (obstructive sleep apnea)    "Mild" by records   PONV (postoperative nausea and vomiting)    first time, no problem since then   RBBB 01/2016   Right inguinal hernia 02/03/2016   ? 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   Splenomegaly    STEMI (ST elevation myocardial infarction) [facility])    August 2015   Subarachnoid hematoma (HCC) 03/12/2018   Subarachnoid hemorrhage (HCC) 03/05/2018   Past Surgical History:  Procedure Laterality Date   CARDIAC CATHETERIZATION     stents x 3 (03/2014)   CHOLECYSTECTOMY     INGUINAL HERNIA REPAIR Right 05/27/2015   Procedure: LAPAROSCOPIC REPAIR RIGHT  INGUINAL HERNIA;  Surgeon: Gaynelle Adu, MD;  Location: [facility];  Service: General;  Laterality: Right;   INSERTION OF MESH Right 05/27/2015   Procedure: INSERTION OF MESH;  Surgeon: Gaynelle Adu, MD;  Location: [facility];  Service: General;  Laterality: Right;   LUMBAR DISC SURGERY     TONSILLECTOMY AND ADENOIDECTOMY     TOTAL KNEE ARTHROPLASTY Right 07/09/2018   Procedure: RIGHT TOTAL KNEE ARTHROPLASTY;  Surgeon: Valeria Batman, MD;  Location: [facility] OR;  Service: Orthopedics;  Laterality: Right;   Family History  Problem Relation Age of Onset   Kidney disease Sister    Colon cancer Sister 34   Colon cancer Brother    Bone  cancer Brother    Arthritis Mother    Breast cancer Mother    Heart disease Mother    Early death Father    Arthritis Son    Esophageal cancer Sister    Bladder Cancer Sister    Rectal cancer Neg Hx    Stomach cancer Neg Hx    Social History   Socioeconomic History   Marital status: Married    Spouse name: Not on file   Number of children: 2   Years of education: Not on file   Highest education level: Not on file  Occupational History   Occupation: retired    Comment: used to work as an Chief Technology Officer; now  retired  Tobacco Use   Smoking status: Former    Current packs/day: 0.00    Average packs/day: 1 pack/day for 20.0 years (20.0 ttl pk-yrs)    Types: Cigarettes    Start date: 08/14/1968    Quit date: 08/14/1988    Years since quitting: 35.0   Smokeless tobacco: Never  Vaping Use   Vaping status: Never Used  Substance and Sexual Activity   Alcohol use: No   Drug use: No   Sexual activity: Yes    Birth control/protection: None  Other Topics Concern   Not on file  Social History Narrative   Married to [place].    HS education. Retired.    Former smoker. No drugs or ETOH.   Wears seatbelt.   Wears dentures.   Smoke detector in the home. Firearms  In the home.    Feels safe in his relationships.    Social Drivers of Corporate investment banker Strain: Low Risk  (09/05/2023)   Overall Financial Resource Strain (CARDIA)    Difficulty of Paying Living Expenses: Not hard at all  Food Insecurity: No Food Insecurity (09/05/2023)   Hunger Vital Sign    Worried About Running Out of Food in the Last Year: Never true    Ran Out of Food in the Last Year: Never true  Transportation Needs: No Transportation Needs (09/05/2023)   PRAPARE - Administrator, Civil Service (Medical): No    Lack of Transportation (Non-Medical): No  Physical Activity: Insufficiently Active (08/23/2022)   Exercise Vital Sign    Days of Exercise per Week: 5 days    Minutes of Exercise per  Session: 20 min  Stress: No Stress Concern Present (09/05/2023)   [employer] of Occupational Health - Occupational Stress Questionnaire    Feeling of Stress : Not at all  Social Connections: Moderately Isolated (09/05/2023)   Social Connection and Isolation Panel [NHANES]    Frequency of Communication with Friends and Family: Three times a week    Frequency of Social Gatherings with Friends and Family: Twice a week    Attends Religious Services: Never    Database administrator or Organizations: No    Attends Engineer, structural: Never    Marital Status: Married    Tobacco Counseling Counseling given: Not Answered   Clinical Intake:  Pre-visit preparation completed: Yes  Pain : No/denies pain     Diabetes: Yes CBG done?: No Did pt. bring in CBG monitor from home?: No  How often do you need to have someone help you when you read instructions, pamphlets, or other written materials from your doctor or pharmacy?: 1 - Never  Interpreter Needed?: No  Information entered by :: Remi Haggard LPN   Activities of Daily Living    09/05/2023    8:17 AM  In your present state of health, do you have any difficulty performing the following activities:  Hearing? 1  Vision? 0  Difficulty concentrating or making decisions? 0  Walking or climbing stairs? 0  Dressing or bathing? 0  Doing errands, shopping? 0  Preparing Food and eating ? N  Using the Toilet? N  In the past six months, have you accidently leaked urine? N  Do you have problems with loss of bowel control? N  Managing your Medications? N  Managing your Finances? N  Housekeeping or managing your Housekeeping? N    Patient Care Team: Natalia Leatherwood, DO as PCP - General (Family Medicine) Arlyce Dice,  Barbette Hair, MD (Inactive) as Consulting Physician (Gastroenterology) Artis Delay, MD as Consulting Physician (Hematology and Oncology) Naoma Diener, MD as Referring Physician (Cardiology) Aliene Beams, MD as  Consulting Physician (Neurosurgery) Jill Side, OD as Referring Physician Valeria Batman, MD (Inactive) as Consulting Physician (Orthopedic Surgery) Vivi Barrack, DPM as Consulting Physician (Podiatry)  Indicate any recent Medical Services you may have received from other than [facility] providers in the past year (date may be approximate).     Assessment:   This is a routine wellness examination for [place].  Hearing/Vision screen Hearing Screening - Comments:: Some hearing trouble Does not wear hearing aids Vision Screening - Comments:: [facility] Up to date   Goals Addressed             This Visit's Progress    Patient Stated       Continue current lifestyle        Depression Screen    11/09/2022    7:58 AM 08/23/2022    8:02 AM 08/17/2021    9:33 AM 07/04/2021    8:53 AM 12/21/2020    9:57 AM 11/02/2020   10:59 AM 08/11/2020    1:37 PM  PHQ 2/9 Scores  PHQ - 2 Score 0 0 0 0 0 0 0    Fall Risk    09/05/2023    8:16 AM 11/09/2022    7:58 AM 08/23/2022    8:02 AM 09/14/2021    9:42 AM 08/17/2021    9:35 AM  Fall Risk   Falls in the past year? 0 0 0 0 0  Number falls in past yr: 0 0 0 0 0  Injury with Fall? 0 0 0 0 0  Risk for fall due to :     Impaired vision  Follow up Falls evaluation completed;Education provided;Falls prevention discussed Falls evaluation completed Falls evaluation completed Falls evaluation completed Falls prevention discussed    MEDICARE RISK AT HOME: Medicare Risk at Home Any stairs in or around the home?: No If so, are there any without handrails?: No Home free of loose throw rugs in walkways, pet beds, electrical cords, etc?: Yes Adequate lighting in your home to reduce risk of falls?: Yes Life alert?: No Use of a cane, walker or w/c?: No Grab bars in the bathroom?: Yes Shower chair or bench in shower?: No Elevated toilet seat or a handicapped toilet?: No  TIMED UP AND GO:  Was the test performed?  No    Cognitive  Function:    06/14/2018    8:45 AM  MMSE - Mini Mental State Exam  Orientation to time 5  Orientation to Place 5  Registration 3  Attention/ Calculation 5  Recall 0  Language- name 2 objects 2  Language- repeat 1  Language- follow 3 step command 3  Language- read & follow direction 1  Write a sentence 1  Copy design 1  Total score 27        09/05/2023    8:19 AM 08/23/2022    8:05 AM 08/17/2021    9:37 AM  6CIT Screen  What Year? 0 points 0 points 0 points  What month? 0 points 0 points 0 points  What time? 0 points 0 points   Count back from 20 0 points 0 points 0 points  Months in reverse 0 points 0 points 0 points  Repeat phrase 0 points 6 points 6 points  Total Score 0 points 6 points     Immunizations Immunization  History  Administered Date(s) Administered   Fluad Quad(high Dose 65+) 04/28/2019, 05/10/2020, 07/04/2021, 05/08/2022   Fluad Trivalent(High Dose 65+) 04/05/2023   Influenza Split 05/07/2012   Influenza, High Dose Seasonal PF 05/03/2016, 07/02/2017, 05/20/2018   Influenza,inj,Quad PF,6+ Mos 09/22/2013, 05/25/2014, 05/31/2015   PFIZER(Purple Top)SARS-COV-2 Vaccination 10/05/2019, 10/29/2019   Pneumococcal Conjugate-13 09/28/2014   Pneumococcal Polysaccharide-23 10/05/2011   Tdap 03/24/2014   Zoster Recombinant(Shingrix) 05/30/2018, 09/17/2018    TDAP status: Up to date  Flu Vaccine status: Up to date  Pneumococcal vaccine status: Up to date  Covid-19 vaccine status: Information provided on how to obtain vaccines.   Qualifies for Shingles Vaccine? No   Zostavax completed Yes   Shingrix Completed?: Yes  Screening Tests Health Maintenance  Topic Date Due   HEMOGLOBIN A1C  10/24/2023   OPHTHALMOLOGY EXAM  12/06/2023   DTaP/Tdap/Td (2 - Td or Tdap) 03/24/2024   Diabetic kidney evaluation - eGFR measurement  04/25/2024   Diabetic kidney evaluation - Urine ACR  04/25/2024   FOOT EXAM  04/25/2024   Medicare Annual Wellness (AWV)  09/04/2024    Pneumonia Vaccine 90+ Years old  Completed   INFLUENZA VACCINE  Completed   Zoster Vaccines- Shingrix  Completed   HPV VACCINES  Aged Out   Colonoscopy  Discontinued   COVID-19 Vaccine  Discontinued    Health Maintenance  There are no preventive care reminders to display for this patient.   Colorectal cancer screening: No longer required.   Lung Cancer Screening: (Low Dose CT Chest recommended if Age 51-80 years, 20 pack-year currently smoking OR have quit w/in 15years.) does not qualify.   Lung Cancer Screening Referral:   Additional Screening:  Hepatitis C Screening: does not qualify;  Vision Screening: Recommended annual ophthalmology exams for early detection of glaucoma and other disorders of the eye. Is the patient up to date with their annual eye exam?  Yes  Who is the provider or what is the name of the office in which the patient attends annual eye exams? [facility] If pt is not established with a provider, would they like to be referred to a provider to establish care? No .   Dental Screening: Recommended annual dental exams for proper oral hygiene  Nutrition Risk Assessment:  Has the patient had any N/V/D within the last 2 months?  No  Does the patient have any non-healing wounds?  No  Has the patient had any unintentional weight loss or weight gain?  No   Diabetes:  Is the patient diabetic?  Yes  If diabetic, was a CBG obtained today?  No  Did the patient bring in their glucometer from home?  No  How often do you monitor your CBG's? Does not check.   Financial Strains and Diabetes Management:  Are you having any financial strains with the device, your supplies or your medication? No .  Does the patient want to be seen by Chronic Care Management for management of their diabetes?  No  Would the patient like to be referred to a Nutritionist or for Diabetic Management?  No   Diabetic Exams:  Diabetic Eye Exam: Completed .  Pt has been advised about the  importance in completing this exam.  Diabetic Foot Exam: . Pt has been advised about the importance in completing this exam. .    Community Resource Referral / Chronic Care Management: CRR required this visit?  No   CCM required this visit?  No     Plan:  I have personally reviewed and noted the following in the patient's chart:   Medical and social history Use of alcohol, tobacco or illicit drugs  Current medications and supplements including opioid prescriptions. Patient is not currently taking opioid prescriptions. Functional ability and status Nutritional status Physical activity Advanced directives List of other physicians Hospitalizations, surgeries, and ER visits in previous 12 months Vitals Screenings to include cognitive, depression, and falls Referrals and appointments  In addition, I have reviewed and discussed with patient certain preventive protocols, quality metrics, and best practice recommendations. A written personalized care plan for preventive services as well as general preventive health recommendations were provided to patient.     Remi Haggard, LPN   [id number]/[id number]   After Visit Summary: (MyChart) Due to this being a telephonic visit, the after visit summary with patients personalized plan was offered to patient via MyChart   Nurse Notes:

## 2023-09-12 ENCOUNTER — Ambulatory Visit: Payer: Medicare Other | Admitting: Family Medicine

## 2023-09-12 ENCOUNTER — Encounter: Payer: Self-pay | Admitting: Family Medicine

## 2023-09-12 VITALS — BP 120/60 | HR 50 | Temp 98.0°F | Wt 174.4 lb

## 2023-09-12 DIAGNOSIS — J329 Chronic sinusitis, unspecified: Secondary | ICD-10-CM

## 2023-09-12 DIAGNOSIS — B9689 Other specified bacterial agents as the cause of diseases classified elsewhere: Secondary | ICD-10-CM | POA: Diagnosis not present

## 2023-09-12 MED ORDER — BENZONATATE 200 MG PO CAPS: 200.0000 mg | ORAL_CAPSULE | Freq: Two times a day (BID) | ORAL | 0 refills | Status: DC | PRN

## 2023-09-12 MED ORDER — DM-GUAIFENESIN ER 30-600 MG PO TB12: 2.0000 | ORAL_TABLET | Freq: Two times a day (BID) | ORAL | 0 refills | Status: DC

## 2023-09-12 MED ORDER — LEVOFLOXACIN 500 MG PO TABS: 500.0000 mg | ORAL_TABLET | Freq: Every day | ORAL | 0 refills | Status: AC

## 2023-09-12 MED ORDER — PREDNISONE 20 MG PO TABS: ORAL_TABLET | ORAL | 0 refills | Status: DC

## 2023-09-12 NOTE — Patient Instructions (Signed)

## 2023-09-12 NOTE — Progress Notes (Signed)
Marcus Taylor , 04/29/1941, 83 y.o., male MRN: [id number] Patient Care Team    Relationship Specialty Notifications Start End  Natalia Leatherwood, DO PCP - General Family Medicine  01/24/22   Louis Meckel, MD (Inactive) Consulting Physician Gastroenterology  01/26/16   Artis Delay, MD Consulting Physician Hematology and Oncology  01/26/16   Naoma Diener, MD Referring Physician Cardiology  01/26/16   Aliene Beams, MD Consulting Physician Neurosurgery  01/27/16   Jill Side, OD Referring Physician   05/03/16    Comment: opthalmology   Valeria Batman, MD (Inactive) Consulting Physician Orthopedic Surgery  06/14/18   Vivi Barrack, DPM Consulting Physician Podiatry  06/14/18     Chief Complaint  Patient presents with   Cough    Pt has been sick for almost a month with cough, runny nose and head congestion. He states the cough is the worse thing.     Subjective: Marcus Taylor is a 83 y.o. Pt presents for an OV with complaints of persistent sinus infection symptoms.  He was treated with prednisone taper and Omnicef 3 weeks ago.  He states the symptoms improved for about a week, and then came back and are now about the same they were when he was seen at the beginning of the month.  He is using Flonase nasal spray.   Patient has a history of splenic marginal zone B-cell lymphoma and has been on chronic steroids     09/05/2023   10:01 AM 11/09/2022    7:58 AM 08/23/2022    8:02 AM 08/17/2021    9:33 AM 07/04/2021    8:53 AM  Depression screen PHQ 2/9  Decreased Interest 0 0 0 0 0  Down, Depressed, Hopeless 0 0 0 0 0  PHQ - 2 Score 0 0 0 0 0  Altered sleeping 0      Tired, decreased energy 0      Change in appetite 0      Feeling bad or failure about yourself  0      Trouble concentrating 0      Moving slowly or fidgety/restless 0      Suicidal thoughts 0      PHQ-9 Score 0      Difficult doing work/chores Not difficult at all        No Known Allergies Social  History   Social History Narrative   Married to Smolan.    HS education. Retired.    Former smoker. No drugs or ETOH.   Wears seatbelt.   Wears dentures.   Smoke detector in the home. Firearms  In the home.    Feels safe in his relationships.    Past Medical History:  Diagnosis Date   Anemia    Arthritis    Cervical radiculopathy 12/22/2019   Colonic polyp    Concussion 03/05/2018   Coronary artery disease    stents x 3 (03-2014)   Diabetes mellitus    Diverticulosis    throughout entire colon   History of colon polyps 01/27/2016   04/2017:  Three sessile polyps were found in the ascending colon. The polyps were 3 mm in size.  These polyps were removed with a cold biopsy forceps. Resection and retrieval were  complete.  - Two sessile polyps were found in the ascending colon. The polyps were 5 to 6 mm in size.  These polyps were removed with a cold snare. Resection and retrieval were complete.  -  A 3 mm polyp was found in the    History of total right knee replacement 07/09/2018   HTN (hypertension)    Hypertriglyceridemia    Ingrown toenail 10/21/2019   Kidney stone    Left hamstring injury 11/02/2020   Lymphocytosis    Metatarsal fracture left   left 5th proximal phalanx    Neck pain 02/19/2018   OSA (obstructive sleep apnea)    "Mild" by records   PONV (postoperative nausea and vomiting)    first time, no problem since then   RBBB 01/2016   Right inguinal hernia 02/03/2016   ? 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   Splenomegaly    STEMI (ST elevation myocardial infarction) [facility])    August 2015   Subarachnoid hematoma (HCC) 03/12/2018   Subarachnoid hemorrhage (HCC) 03/05/2018   Past Surgical History:  Procedure Laterality Date   CARDIAC CATHETERIZATION     stents x 3 (03/2014)   CHOLECYSTECTOMY     INGUINAL HERNIA REPAIR Right 05/27/2015   Procedure: LAPAROSCOPIC REPAIR RIGHT  INGUINAL HERNIA;  Surgeon: Gaynelle Adu, MD;  Location: [facility];  Service: General;  Laterality: Right;   INSERTION OF MESH Right 05/27/2015   Procedure: INSERTION OF MESH;  Surgeon: Gaynelle Adu, MD;  Location: [facility];  Service: General;  Laterality: Right;   LUMBAR DISC SURGERY     TONSILLECTOMY AND ADENOIDECTOMY     TOTAL KNEE ARTHROPLASTY Right 07/09/2018   Procedure: RIGHT TOTAL KNEE ARTHROPLASTY;  Surgeon: Valeria Batman, MD;  Location: [facility] OR;  Service: Orthopedics;  Laterality: Right;   Family History  Problem Relation Age of Onset   Kidney disease Sister    Colon cancer Sister 79   Colon cancer Brother    Bone cancer Brother    Arthritis Mother    Breast cancer Mother    Heart disease Mother    Early death Father    Arthritis Son    Esophageal cancer Sister    Bladder Cancer Sister    Rectal cancer Neg Hx    Stomach cancer Neg Hx    Allergies as of 09/12/2023   No Known Allergies      Medication List        Accurate as of September 12, 2023 12:49 PM. If you have any questions, ask your nurse or doctor.          STOP taking these medications    cefdinir 300 MG capsule Commonly known as: OMNICEF Stopped by: Felix Pacini       TAKE these medications    amLODipine 5 MG tablet Commonly known as: NORVASC Take 1 tablet (5 mg total) by mouth daily.   aspirin EC 81 MG tablet Take 81 mg by mouth daily.   atorvastatin 40 MG tablet Commonly known as: LIPITOR Take 1 tablet (40 mg total) by mouth daily.   benzonatate 200 MG capsule Commonly known as: TESSALON Take 1 capsule (200 mg total) by mouth 2 (two) times daily as needed for cough. Started by: Felix Pacini   dextromethorphan-guaiFENesin 30-600 MG 12hr tablet Commonly known as: MUCINEX DM Take 2 tablets by mouth 2 (two) times daily. Started by: Felix Pacini   diclofenac Sodium 1 % Gel Commonly known as: VOLTAREN Apply 2 g topically 4 (four) times daily.   esomeprazole 20 MG capsule Commonly known as: NEXIUM Take 20 mg by  mouth every morning.   ferrous sulfate 324 MG Tbec Take 324  mg by mouth daily.   fluticasone 50 MCG/ACT nasal spray Commonly known as: FLONASE Place 1 spray into both nostrils in the morning and at bedtime.   furosemide 20 MG tablet Commonly known as: LASIX Take 0.5-1 tablets (10-20 mg total) by mouth daily as needed.   gabapentin 100 MG capsule Commonly known as: NEURONTIN Take 1-3 capsules (100-300 mg total) by mouth at bedtime.   levofloxacin 500 MG tablet Commonly known as: LEVAQUIN Take 1 tablet (500 mg total) by mouth daily for 7 days. Started by: Felix Pacini   lisinopril 40 MG tablet Commonly known as: ZESTRIL Take 1 tablet (40 mg total) by mouth daily.   metoprolol tartrate 25 MG tablet Commonly known as: LOPRESSOR Take 0.5 tablets (12.5 mg total) by mouth 2 (two) times daily.   nitroGLYCERIN 0.4 MG SL tablet Commonly known as: NITROSTAT Place 1 tablet (0.4 mg total) under the tongue every 5 (five) minutes as needed for chest pain.   predniSONE 20 MG tablet Commonly known as: DELTASONE 60 mg x3d, 40 mg x3d, 20 mg x2d, 10 mg x2d        All past medical history, surgical history, allergies, family history, immunizations andmedications were updated in the EMR today and reviewed under the history and medication portions of their EMR.     Review of Systems  Constitutional:  Positive for malaise/fatigue. Negative for chills and fever.  HENT:  Positive for congestion and sinus pain. Negative for ear pain.   Respiratory:  Positive for cough. Negative for sputum production and shortness of breath.   Gastrointestinal:  Positive for constipation. Negative for nausea and vomiting.  Musculoskeletal:  Negative for myalgias.  Neurological:  Positive for headaches. Negative for dizziness.   Negative, with the exception of above mentioned in HPI   Objective:  BP 120/60   Pulse (!) 50   Temp 98 F (36.7 C) (Oral)   Wt 174 lb 6.4 oz (79.1 kg)   SpO2 96%   BMI 25.02  kg/m  Body mass index is 25.02 kg/m. Physical Exam Vitals and nursing note reviewed.  Constitutional:      General: He is not in acute distress.    Appearance: Normal appearance. He is not ill-appearing, toxic-appearing or diaphoretic.  HENT:     Head: Normocephalic and atraumatic.     Right Ear: Tympanic membrane, ear canal and external ear normal. There is no impacted cerumen.     Left Ear: Tympanic membrane, ear canal and external ear normal. There is no impacted cerumen.     Nose: Congestion and rhinorrhea present.     Comments: Postnasal drip present.  Sinus pressure present    Mouth/Throat:     Mouth: Mucous membranes are moist.     Pharynx: No oropharyngeal exudate or posterior oropharyngeal erythema.  Eyes:     General: No scleral icterus.       Right eye: No discharge.        Left eye: No discharge.     Extraocular Movements: Extraocular movements intact.     Pupils: Pupils are equal, round, and reactive to light.  Cardiovascular:     Rate and Rhythm: Normal rate and regular rhythm.  Pulmonary:     Effort: Pulmonary effort is normal. No respiratory distress.     Breath sounds: Normal breath sounds. No wheezing, rhonchi or rales.  Musculoskeletal:     Cervical back: Neck supple.     Right lower leg: No edema.     Left lower leg:  No edema.  Lymphadenopathy:     Cervical: Cervical adenopathy present.  Skin:    General: Skin is warm.     Findings: No rash.  Neurological:     Mental Status: He is alert and oriented to person, place, and time. Mental status is at baseline.  Psychiatric:        Mood and Affect: Mood normal.        Behavior: Behavior normal.        Thought Content: Thought content normal.        Judgment: Judgment normal.     No results found. No results found. No results found for this or any previous visit (from the past 24 hours).  Assessment/Plan: Marcus Taylor is a 83 y.o. male present for OV for  Bacterial sinusitis Lung exam is  reassuring that likely pneumonia, but he does have rather significant nasal congestion and sinusitis today. Rest, hydrate.  flonase, mucinex (DM if cough),  nasal saline.  Levaquin prescribed, take until completed.  Start Allegra daily Mucinex DM prescribed.  Tessalon Perles prescribed. If cough present it can last up to 6-8 weeks.  F/U 2 weeks if not improved.   Reviewed expectations re: course of current medical issues. Discussed self-management of symptoms. Outlined signs and symptoms indicating need for more acute intervention. Patient verbalized understanding and all questions were answered. Patient received an After-Visit Summary.    No orders of the defined types were placed in this encounter.  Meds ordered this encounter  Medications   levofloxacin (LEVAQUIN) 500 MG tablet    Sig: Take 1 tablet (500 mg total) by mouth daily for 7 days.    Dispense:  7 tablet    Refill:  0   dextromethorphan-guaiFENesin (MUCINEX DM) 30-600 MG 12hr tablet    Sig: Take 2 tablets by mouth 2 (two) times daily.    Dispense:  120 tablet    Refill:  0   benzonatate (TESSALON) 200 MG capsule    Sig: Take 1 capsule (200 mg total) by mouth 2 (two) times daily as needed for cough.    Dispense:  20 capsule    Refill:  0   predniSONE (DELTASONE) 20 MG tablet    Sig: 60 mg x3d, 40 mg x3d, 20 mg x2d, 10 mg x2d    Dispense:  18 tablet    Refill:  0   Referral Orders  No referral(s) requested today     Note is dictated utilizing voice recognition software. Although note has been proof read prior to signing, occasional typographical errors still can be missed. If any questions arise, please do not hesitate to call for verification.   electronically signed by:  Felix Pacini, DO  [facility] - OR

## 2023-09-17 ENCOUNTER — Encounter (HOSPITAL_BASED_OUTPATIENT_CLINIC_OR_DEPARTMENT_OTHER): Payer: Self-pay | Admitting: Physical Therapy

## 2023-09-17 ENCOUNTER — Ambulatory Visit (HOSPITAL_BASED_OUTPATIENT_CLINIC_OR_DEPARTMENT_OTHER): Payer: Medicare Other | Attending: Podiatry | Admitting: Physical Therapy

## 2023-09-17 DIAGNOSIS — M66871 Spontaneous rupture of other tendons, right ankle and foot: Secondary | ICD-10-CM | POA: Insufficient documentation

## 2023-09-17 DIAGNOSIS — R2689 Other abnormalities of gait and mobility: Secondary | ICD-10-CM | POA: Insufficient documentation

## 2023-09-17 NOTE — Therapy (Signed)
OUTPATIENT PHYSICAL THERAPY LOWER EXTREMITY EVALUATION   Patient Name: Marcus Taylor MRN: [id number] DOB:Dec 26, 1940, 83 y.o., male Today's Date: 09/17/2023  END OF SESSION:  PT End of Session - 09/17/23 1048     Visit Number 1    Number of Visits 6    Date for PT Re-Evaluation 10/29/23    PT Start Time 0845    PT Stop Time 0925    PT Time Calculation (min) 40 min    Activity Tolerance Patient tolerated treatment well    Behavior During Therapy [facility] for tasks assessed/performed             Past Medical History:  Diagnosis Date   Anemia    Arthritis    Cervical radiculopathy 12/22/2019   Colonic polyp    Concussion 03/05/2018   Coronary artery disease    stents x 3 (03-2014)   Diabetes mellitus    Diverticulosis    throughout entire colon   History of colon polyps 01/27/2016   04/2017:  Three sessile polyps were found in the ascending colon. The polyps were 3 mm in size.  These polyps were removed with a cold biopsy forceps. Resection and retrieval were  complete.  - Two sessile polyps were found in the ascending colon. The polyps were 5 to 6 mm in size.  These polyps were removed with a cold snare. Resection and retrieval were complete.  - A 3 mm polyp was found in the    History of total right knee replacement 07/09/2018   HTN (hypertension)    Hypertriglyceridemia    Ingrown toenail 10/21/2019   Kidney stone    Left hamstring injury 11/02/2020   Lymphocytosis    Metatarsal fracture left   left 5th proximal phalanx    Neck pain 02/19/2018   OSA (obstructive sleep apnea)    "Mild" by records   PONV (postoperative nausea and vomiting)    first time, no problem since then   RBBB 01/2016   Right inguinal hernia 02/03/2016   ? 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   Splenomegaly    STEMI (ST elevation myocardial infarction) [facility])    August 2015   Subarachnoid hematoma (HCC) 03/12/2018   Subarachnoid hemorrhage  (HCC) 03/05/2018   Past Surgical History:  Procedure Laterality Date   CARDIAC CATHETERIZATION     stents x 3 (03/2014)   CHOLECYSTECTOMY     INGUINAL HERNIA REPAIR Right 05/27/2015   Procedure: LAPAROSCOPIC REPAIR RIGHT  INGUINAL HERNIA;  Surgeon: Gaynelle Adu, MD;  Location: [facility];  Service: General;  Laterality: Right;   INSERTION OF MESH Right 05/27/2015   Procedure: INSERTION OF MESH;  Surgeon: Gaynelle Adu, MD;  Location: [facility];  Service: General;  Laterality: Right;   LUMBAR DISC SURGERY     TONSILLECTOMY AND ADENOIDECTOMY     TOTAL KNEE ARTHROPLASTY Right 07/09/2018   Procedure: RIGHT TOTAL KNEE ARTHROPLASTY;  Surgeon: Valeria Batman, MD;  Location: [facility] OR;  Service: Orthopedics;  Laterality: Right;   Patient Active Problem List   Diagnosis Date Noted   B12 deficiency 04/26/2023   Carpal tunnel syndrome of right wrist 06/14/2022   Type 2 diabetes mellitus with hyperlipidemia (HCC) 07/04/2021   Liver cirrhosis (HCC) 07/25/2018   Pancytopenia, acquired (HCC) 04/19/2018   Primary osteoarthritis of right knee 04/19/2018   Decreased hearing of both ears 03/20/2018   Splenomegaly 03/12/2018   Enlarged prostate without lower urinary tract symptoms (luts) 03/12/2018  IDA (iron deficiency anemia) 08/09/2016   Thrombocytopenia (HCC) 05/30/2016   History of ST elevation myocardial infarction (STEMI) 01/27/2016   CAD (coronary artery disease) 01/27/2016   Anemia in neoplastic disease 05/25/2014   Splenic marginal zone b-cell lymphoma (HCC) 12/18/2011   HTN (hypertension)     PCP: Felix Pacini DO   REFERRING PROVIDER: Sarina Ill DO  REFERRING DIAG: Right foot tendon rupture.   THERAPY DIAG:  Other abnormalities of gait and mobility  Rationale for Evaluation and Treatment: Rehabilitation  ONSET DATE:   SUBJECTIVE:   SUBJECTIVE STATEMENT: About 4 months ago the patient was turning over in bed and he felt a pop in the top of his foot. He was found to have a rupture  of one of his foot extensors. He has had difficulty lifting his foot since that point. He feels as though when he is walking his foot slaps the floor.  PERTINENT HISTORY: Arthritis in multiple joints including hands, CAD, DM2, bilateral peripheral neuropathy, past history of cervical radiculopathy, lumbar disc surgery, total knee arthroplasty 2019, past history of subarachnoid hematoma 03/12/2018 no residual effects PAIN:  Are you having pain? Yes: NPRS scale: 0/10 at this time  Pain location: was in the top of the foot Pain description: aching  Aggravating factors: walking and activity  Relieving factors: hasn't hurt in some time  PRECAUTIONS: None  RED FLAGS: None   WEIGHT BEARING RESTRICTIONS: No  FALLS:  Has patient fallen in last 6 months? No  LIVING ENVIRONMENT: No steps  OCCUPATION:  Retired   Hobbies:    PLOF: Independent  PATIENT GOALS: To have better control of his foot  NEXT MD VISIT: Nothing scheduled  OBJECTIVE:  Note: Objective measures were completed at Evaluation unless otherwise noted.  DIAGNOSTIC FINDINGS: Patient had x-rays.  Severe OA of first MTP joint  PATIENT SURVEYS:  LEFS 67/80  COGNITION: Overall cognitive status: Within functional limits for tasks assessed     SENSATION: Peripheral neuropathy   EDEMA:  Selling on the top of the foot near the talus   MUSCLE LENGTH:  POSTURE: No Significant postural limitations  PALPATION: No unexpected TTP  LOWER EXTREMITY ROM:  {AROM/PROM:27142} ROM Right eval Left eval  Hip flexion    Hip extension    Hip abduction    Hip adduction    Hip internal rotation    Hip external rotation    Knee flexion    Knee extension    Ankle dorsiflexion    Ankle plantarflexion    Ankle inversion    Ankle eversion     (Blank rows = not tested)  LOWER EXTREMITY MMT:  MMT Right eval Left eval  Hip flexion    Hip extension    Hip abduction    Hip adduction    Hip internal rotation    Hip  external rotation    Knee flexion    Knee extension    Ankle dorsiflexion    Ankle plantarflexion    Ankle inversion    Ankle eversion     (Blank rows = not tested)  LOWER EXTREMITY SPECIAL TESTS:  {LEspecialtests:26242}  FUNCTIONAL TESTS:  {Functional tests:24029}  GAIT: Distance walked: *** Assistive device utilized: {Assistive devices:23999} Level of assistance: {Levels of assistance:24026} Comments: ***  TREATMENT DATE: ***    PATIENT EDUCATION:  Education details: *** Person educated: {Person educated:25204} Education method: {Education Method:25205} Education comprehension: {Education Comprehension:25206}  HOME EXERCISE PROGRAM: ***  ASSESSMENT:  CLINICAL IMPRESSION: Patient is a *** y.o. *** who was seen today for physical therapy evaluation and treatment for ***.   OBJECTIVE IMPAIRMENTS: {opptimpairments:25111}.   ACTIVITY LIMITATIONS: {activitylimitations:27494}  PARTICIPATION LIMITATIONS: {participationrestrictions:25113}  PERSONAL FACTORS: {Personal factors:25162} are also affecting patient's functional outcome.   REHAB POTENTIAL: {rehabpotential:25112}  CLINICAL DECISION MAKING: {clinical decision making:25114}  EVALUATION COMPLEXITY: {Evaluation complexity:25115}   GOALS: Goals reviewed with patient? {yes/no:20286}  SHORT TERM GOALS: Target date: *** *** Baseline: Goal status: INITIAL  2.  *** Baseline:  Goal status: INITIAL  3.  *** Baseline:  Goal status: INITIAL  4.  *** Baseline:  Goal status: INITIAL  5.  *** Baseline:  Goal status: INITIAL  6.  *** Baseline:  Goal status: INITIAL  LONG TERM GOALS: Target date: ***  *** Baseline:  Goal status: INITIAL  2.  *** Baseline:  Goal status: INITIAL  3.  *** Baseline:  Goal status: INITIAL  4.  *** Baseline:  Goal status: INITIAL  5.   *** Baseline:  Goal status: INITIAL  6.  *** Baseline:  Goal status: INITIAL   PLAN:  PT FREQUENCY: {rehab frequency:25116}  PT DURATION: {rehab duration:25117}  PLANNED INTERVENTIONS: {rehab planned interventions:25118::"97110-Therapeutic exercises","97530- Therapeutic [phone]- Neuromuscular re-education","97535- Self HQIO","96295- Manual therapy"}  PLAN FOR NEXT SESSION: ***   Dessie Coma, PT 09/17/2023, 10:57 AM

## 2023-09-18 ENCOUNTER — Encounter (HOSPITAL_BASED_OUTPATIENT_CLINIC_OR_DEPARTMENT_OTHER): Payer: Self-pay | Admitting: Physical Therapy

## 2023-09-24 ENCOUNTER — Ambulatory Visit (INDEPENDENT_AMBULATORY_CARE_PROVIDER_SITE_OTHER): Payer: Medicare Other

## 2023-09-24 DIAGNOSIS — E538 Deficiency of other specified B group vitamins: Secondary | ICD-10-CM | POA: Diagnosis not present

## 2023-09-24 NOTE — Progress Notes (Signed)
 Pt here for monthly B12 injection per kuneff  B12 1000mcg given IM, and pt tolerated injection well.  Next B12 injection scheduled for 1 month

## 2023-09-25 ENCOUNTER — Ambulatory Visit: Payer: Medicare Other | Attending: Family Medicine | Admitting: Audiologist

## 2023-09-25 DIAGNOSIS — H906 Mixed conductive and sensorineural hearing loss, bilateral: Secondary | ICD-10-CM | POA: Diagnosis not present

## 2023-09-25 NOTE — Procedures (Signed)
Outpatient Audiology and [facility] [address] [place], [place]  [address] [phone]  AUDIOLOGICAL  EVALUATION  NAME: Marcus Taylor     DOB:   [birth year]      MRN: [id number]                                                                                     DATE: 09/25/2023     REFERENT: Natalia Leatherwood, DO STATUS: Outpatient DIAGNOSIS: Mixed Hearing Loss Bilateral    History: Gerik was seen for an audiological evaluation due to difficulty hearing for many years. Harjit has had a hearing test previously, years ago. This test showed he had hearing loss. He does not hear as well from the right ear. He feels the left ear has changed. 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  Medical history shows some risk for hearing loss due to type 2 diabetes. No other relevant case history reported.  Note from PCP last month stated that [facility] presented for concerns of continued decreased hearing despite clearance of cerumen in the left ear. He feels hearing was reduced over Christmas time. He also endorses head congestion started at that time as well.     After testing Tye reports that his head feel stuffy. He has been seen three times for the stuffy feeling and given antibiotics. They help for a while and he hears better, but once the congested feeling returns he cannot hear again.   Evaluation:  Otoscopy showed a clear view of the tympanic membranes, bilaterally Tympanometry results were consistent with normal middle ear function, bilaterally.  Audiometric testing was completed using Conventional Audiometry techniques with insert earphones and supraural headphones. Test results are consistent with predominantly sensorineural mild sloping to severe hearing loss with a conductive components throughout bilaterally. See below. Speech Recognition Thresholds were obtained at 45 dB  HL in the right ear and at 35 dB HL in the left ear. Word Recognition Testing was completed at  40dB SL and Judas scored 80% bilaterally.    Results:  The test results were reviewed with Mosetta Putt. Tsugio has a mild to severe sensorineural hearing loss bilaterally with conductive component in the right ear. Hearing aids are recommended for bilaterally pending ENT clearance for conductive component. Audiogram printed and provided to [facility] as well as local hearing aid and ENT providers.     Recommendations: Hearing aids recommended for both ears. Patient given list of local hearing aid providers and an [employer]. He can call the number to be set up with a hearing aid appointment with partial coverage from [facility] for the aids. See Otolaryngology due to chronic feeling of congestion and mixed hearing loss and slight asymmetry.  Annual audiometric testing recommended to monitor hearing loss for progression. This can be performed where he purchases aids.   40 minutes spent testing and counseling on results.   If you have any questions please feel free to contact me at [phone].  Ammie Ferrier Au.D.  Audiologist   09/25/2023  9:03 AM  Cc: Felix Pacini A, DO

## 2023-10-11 ENCOUNTER — Ambulatory Visit (HOSPITAL_BASED_OUTPATIENT_CLINIC_OR_DEPARTMENT_OTHER): Payer: Medicare Other | Admitting: Physical Therapy

## 2023-10-11 ENCOUNTER — Ambulatory Visit: Payer: Medicare Other | Admitting: Family Medicine

## 2023-10-11 ENCOUNTER — Telehealth: Payer: Self-pay

## 2023-10-11 ENCOUNTER — Encounter: Payer: Self-pay | Admitting: Family Medicine

## 2023-10-11 VITALS — BP 118/66 | HR 57 | Temp 98.0°F | Ht 70.0 in | Wt 171.2 lb

## 2023-10-11 DIAGNOSIS — E785 Hyperlipidemia, unspecified: Secondary | ICD-10-CM

## 2023-10-11 DIAGNOSIS — Z5181 Encounter for therapeutic drug level monitoring: Secondary | ICD-10-CM | POA: Insufficient documentation

## 2023-10-11 DIAGNOSIS — E1169 Type 2 diabetes mellitus with other specified complication: Secondary | ICD-10-CM | POA: Diagnosis not present

## 2023-10-11 DIAGNOSIS — K219 Gastro-esophageal reflux disease without esophagitis: Secondary | ICD-10-CM | POA: Diagnosis not present

## 2023-10-11 DIAGNOSIS — I252 Old myocardial infarction: Secondary | ICD-10-CM | POA: Diagnosis not present

## 2023-10-11 DIAGNOSIS — D500 Iron deficiency anemia secondary to blood loss (chronic): Secondary | ICD-10-CM | POA: Diagnosis not present

## 2023-10-11 DIAGNOSIS — I10 Essential (primary) hypertension: Secondary | ICD-10-CM

## 2023-10-11 DIAGNOSIS — E538 Deficiency of other specified B group vitamins: Secondary | ICD-10-CM

## 2023-10-11 DIAGNOSIS — D696 Thrombocytopenia, unspecified: Secondary | ICD-10-CM

## 2023-10-11 DIAGNOSIS — Z0000 Encounter for general adult medical examination without abnormal findings: Secondary | ICD-10-CM | POA: Diagnosis not present

## 2023-10-11 DIAGNOSIS — Z79899 Other long term (current) drug therapy: Secondary | ICD-10-CM

## 2023-10-11 DIAGNOSIS — C8307 Small cell B-cell lymphoma, spleen: Secondary | ICD-10-CM

## 2023-10-11 DIAGNOSIS — I2510 Atherosclerotic heart disease of native coronary artery without angina pectoris: Secondary | ICD-10-CM

## 2023-10-11 DIAGNOSIS — R161 Splenomegaly, not elsewhere classified: Secondary | ICD-10-CM | POA: Diagnosis not present

## 2023-10-11 DIAGNOSIS — D630 Anemia in neoplastic disease: Secondary | ICD-10-CM

## 2023-10-11 DIAGNOSIS — R809 Proteinuria, unspecified: Secondary | ICD-10-CM

## 2023-10-11 LAB — COMPREHENSIVE METABOLIC PANEL
ALT: 16 U/L (ref 0–53)
AST: 32 U/L (ref 0–37)
Albumin: 4.1 g/dL (ref 3.5–5.2)
Alkaline Phosphatase: 54 U/L (ref 39–117)
BUN: 23 mg/dL (ref 6–23)
CO2: 33 meq/L — ABNORMAL HIGH (ref 19–32)
Calcium: 8.8 mg/dL (ref 8.4–10.5)
Chloride: 105 meq/L (ref 96–112)
Creatinine, Ser: 0.79 mg/dL (ref 0.40–1.50)
GFR: 82.82 mL/min (ref >60.00)
Glucose, Bld: 93 mg/dL (ref 70–99)
Potassium: 3.9 meq/L (ref 3.5–5.1)
Sodium: 143 meq/L (ref 135–145)
Total Bilirubin: 0.4 mg/dL (ref 0.2–1.2)
Total Protein: 5.9 g/dL — ABNORMAL LOW (ref 6.0–8.3)

## 2023-10-11 LAB — TSH: TSH: 1.72 u[IU]/mL (ref 0.35–5.50)

## 2023-10-11 LAB — CBC
HCT: 32.2 % — ABNORMAL LOW (ref 39.0–52.0)
Hemoglobin: 10.2 g/dL — ABNORMAL LOW (ref 13.0–17.0)
MCHC: 31.6 g/dL (ref 30.0–36.0)
MCV: 86 fL (ref 78.0–100.0)
Platelets: 177 10*3/uL (ref 150.0–400.0)
RBC: 3.74 Mil/uL — ABNORMAL LOW (ref 4.22–5.81)
RDW: 19.6 % — ABNORMAL HIGH (ref 11.5–15.5)
WBC: 25.7 10*3/uL (ref 4.0–10.5)

## 2023-10-11 LAB — VITAMIN D 25 HYDROXY (VIT D DEFICIENCY, FRACTURES): VITD: 32.09 ng/mL (ref 30.00–100.00)

## 2023-10-11 LAB — IBC + FERRITIN
Ferritin: 18.8 ng/mL — ABNORMAL LOW (ref 22.0–322.0)
Iron: 69 ug/dL (ref 42–165)
Saturation Ratios: 21.9 % (ref 20.0–50.0)
TIBC: 315 ug/dL (ref 250.0–450.0)
Transferrin: 225 mg/dL (ref 212.0–360.0)

## 2023-10-11 LAB — MICROALBUMIN / CREATININE URINE RATIO
Creatinine,U: 97.6 mg/dL
Microalb Creat Ratio: 89.2 mg/g — ABNORMAL HIGH (ref 0.0–30.0)
Microalb, Ur: 8.7 mg/dL — ABNORMAL HIGH (ref 0.0–1.9)

## 2023-10-11 LAB — B12 AND FOLATE PANEL
Folate: 22.2 ng/mL (ref >5.9)
Vitamin B-12: 668 pg/mL (ref 211–911)

## 2023-10-11 LAB — MAGNESIUM: Magnesium: 2 mg/dL (ref 1.5–2.5)

## 2023-10-11 LAB — HEMOGLOBIN A1C: Hgb A1c MFr Bld: 6 % (ref 4.6–6.5)

## 2023-10-11 MED ORDER — FUROSEMIDE 20 MG PO TABS
10.0000 mg | ORAL_TABLET | Freq: Every day | ORAL | 1 refills | Status: DC | PRN
Start: 2023-10-11 — End: 2024-03-24

## 2023-10-11 MED ORDER — LISINOPRIL 40 MG PO TABS: 40.0000 mg | ORAL_TABLET | Freq: Every day | ORAL | 1 refills | Status: DC

## 2023-10-11 MED ORDER — GABAPENTIN 100 MG PO CAPS
100.0000 mg | ORAL_CAPSULE | Freq: Every day | ORAL | 5 refills | Status: DC
Start: 2023-10-11 — End: 2024-03-27

## 2023-10-11 MED ORDER — AMLODIPINE BESYLATE 5 MG PO TABS: 5.0000 mg | ORAL_TABLET | Freq: Every day | ORAL | 1 refills | Status: DC

## 2023-10-11 MED ORDER — TETANUS-DIPHTH-ACELL PERTUSSIS 5-2.5-18.5 LF-MCG/0.5 IM SUSP: 0.5000 mL | Freq: Once | INTRAMUSCULAR | 0 refills | Status: AC

## 2023-10-11 MED ORDER — ATORVASTATIN CALCIUM 40 MG PO TABS: 40.0000 mg | ORAL_TABLET | Freq: Every day | ORAL | 3 refills | Status: DC

## 2023-10-11 MED ORDER — METOPROLOL TARTRATE 25 MG PO TABS: 12.5000 mg | ORAL_TABLET | Freq: Two times a day (BID) | ORAL | 1 refills | Status: DC

## 2023-10-11 NOTE — Telephone Encounter (Signed)
 The lab called with a critical white blood cell count of 25.7 for pt

## 2023-10-11 NOTE — Progress Notes (Signed)
 Patient ID: Marcus Taylor, male  DOB: 1940/08/20, 83 y.o.   MRN: [id number] Patient Care Team    Relationship Specialty Notifications Start End  Natalia Leatherwood, DO PCP - General Family Medicine  01/24/22   Artis Delay, MD Consulting Physician Hematology and Oncology  01/26/16   Naoma Diener, MD Referring Physician Cardiology  01/26/16   Aliene Beams, MD Consulting Physician Neurosurgery  01/27/16   Jill Side, OD Referring Physician   05/03/16    Comment: opthalmology   Vivi Barrack, DPM Consulting Physician Podiatry  06/14/18   Gomez Cleverly, MD Consulting Physician Orthopedic Surgery  10/11/23     Chief Complaint  Patient presents with   Annual Exam    Chronic Conditions/illness Management Pt is fasting.     Subjective: Marcus Taylor is a 83 y.o. male present for Chronic Conditions/illness Management  All past medical history, surgical history, allergies, family history, immunizations, medications and social history were updated in the electronic medical record today. All recent labs, ED visits and hospitalizations within the last year were reviewed.  Health maintenance:  Colonoscopy: last screen 2022 Completed by Dr. Adela Lank.no further screenings d/t age Immunizations:  tdap due> printed, influenza UTD (encouraged yearly), PNA series completed. shingrix completed.  PSA: > 80 not indicated  Hypertension/H/o MI/CAD/stentsx3/iron deficiency anemia: Pt routinely follows with cardiology at [facility], Dr. Dion Body. Pt has had stent placement and MI in 2015. Last seen 2022- reports he was told he does not need to come back unless he has a problem He is compliant with Lipitor 40 mg, lisinopril 40 mg, lopressor 12.5 (BID), amlodipine 5 mg qd.  He takes a daily baby asa.  Lasix 10-20 mg qd prn.  He is prescribed nitro, he -has not needed it.  Plavix  stopped (> 97yr since stent)  .  Patient denies chest pain, shortness of breath, dizziness or lower extremity edema.    Diabetes type 2/microalbuminuria: Patient  has been diet controlled for almost a year.  He had been on metformin in the past couple times a week only. Patient denies dizziness, hyperglycemic or hypoglycemic events. Patient denies numbness, tingling in the extremities or nonhealing wounds of feet.    Patient reports he is still using the gabapentin 100-300 mg nightly  iron deficiency anemia:  Iron UTD due today.  Supplementing      10/11/2023    8:06 AM 09/05/2023   10:01 AM 11/09/2022    7:58 AM 08/23/2022    8:02 AM 08/17/2021    9:33 AM  Depression screen PHQ 2/9  Decreased Interest 0 0 0 0 0  Down, Depressed, Hopeless 0 0 0 0 0  PHQ - 2 Score 0 0 0 0 0  Altered sleeping 0 0     Tired, decreased energy 0 0     Change in appetite 0 0     Feeling bad or failure about yourself  0 0     Trouble concentrating 0 0     Moving slowly or fidgety/restless 0 0     Suicidal thoughts 0 0     PHQ-9 Score 0 0     Difficult doing work/chores Not difficult at all Not difficult at all         10/11/2023    8:06 AM  GAD 7 : Generalized Anxiety Score  Nervous, Anxious, on Edge 0  Control/stop worrying 0  Worry too much - different things 0  Trouble relaxing 0  Restless 0  Easily annoyed or irritable 0  Afraid - awful might happen 0  Total GAD 7 Score 0  Anxiety Difficulty Not difficult at all          10/11/2023    8:04 AM 09/05/2023    8:16 AM 11/09/2022    7:58 AM 08/23/2022    8:02 AM 09/14/2021    9:42 AM  Fall Risk   Falls in the past year? 0 0 0 0 0  Number falls in past yr:  0 0 0 0  Injury with Fall?  0 0 0 0  Follow up Falls evaluation completed Falls evaluation completed;Education provided;Falls prevention discussed Falls evaluation completed Falls evaluation completed Falls evaluation completed   Immunization History  Administered Date(s) Administered   Fluad Quad(high Dose 65+) 04/28/2019, 05/10/2020, 07/04/2021, 05/08/2022   Fluad Trivalent(High Dose 65+) 04/05/2023    Influenza Split 05/07/2012   Influenza, High Dose Seasonal PF 05/03/2016, 07/02/2017, 05/20/2018   Influenza,inj,Quad PF,6+ Mos 09/22/2013, 05/25/2014, 05/31/2015   PFIZER(Purple Top)SARS-COV-2 Vaccination 10/05/2019, 10/29/2019   Pneumococcal Conjugate-13 09/28/2014   Pneumococcal Polysaccharide-23 10/05/2011   Tdap 03/24/2014   Zoster Recombinant(Shingrix) 05/30/2018, 09/17/2018    Past Medical History:  Diagnosis Date   Anemia    Arthritis    Cervical radiculopathy 12/22/2019   Colonic polyp    Concussion 03/05/2018   Coronary artery disease    stents x 3 (03-2014)   Diabetes mellitus    Diverticulosis    throughout entire colon   History of colon polyps 01/27/2016   04/2017:  Three sessile polyps were found in the ascending colon. The polyps were 3 mm in size.  These polyps were removed with a cold biopsy forceps. Resection and retrieval were  complete.  - Two sessile polyps were found in the ascending colon. The polyps were 5 to 6 mm in size.  These polyps were removed with a cold snare. Resection and retrieval were complete.  - A 3 mm polyp was found in the    History of total right knee replacement 07/09/2018   HTN (hypertension)    Hypertriglyceridemia    Ingrown toenail 10/21/2019   Kidney stone    Left hamstring injury 11/02/2020   Lymphocytosis    Metatarsal fracture left   left 5th proximal phalanx    Neck pain 02/19/2018   OSA (obstructive sleep apnea)    "Mild" by records   PONV (postoperative nausea and vomiting)    first time, no problem since then   RBBB 01/2016   Right inguinal hernia 02/03/2016   ? 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   Splenomegaly    STEMI (ST elevation myocardial infarction) [facility])    August 2015   Subarachnoid hematoma (HCC) 03/12/2018   Subarachnoid hemorrhage (HCC) 03/05/2018   No Known Allergies Past Surgical History:  Procedure Laterality Date   CARDIAC CATHETERIZATION      stents x 3 (03/2014)   CHOLECYSTECTOMY     INGUINAL HERNIA REPAIR Right 05/27/2015   Procedure: LAPAROSCOPIC REPAIR RIGHT  INGUINAL HERNIA;  Surgeon: Gaynelle Adu, MD;  Location: [facility];  Service: General;  Laterality: Right;   INSERTION OF MESH Right 05/27/2015   Procedure: INSERTION OF MESH;  Surgeon: Gaynelle Adu, MD;  Location: [facility];  Service: General;  Laterality: Right;   LUMBAR DISC SURGERY     TONSILLECTOMY AND ADENOIDECTOMY     TOTAL KNEE ARTHROPLASTY Right 07/09/2018   Procedure: RIGHT TOTAL KNEE ARTHROPLASTY;  Surgeon:  Valeria Batman, MD;  Location: [facility] OR;  Service: Orthopedics;  Laterality: Right;   Family History  Problem Relation Age of Onset   Kidney disease Sister    Colon cancer Sister 88   Colon cancer Brother    Bone cancer Brother    Arthritis Mother    Breast cancer Mother    Heart disease Mother    Early death Father    Arthritis Son    Esophageal cancer Sister    Bladder Cancer Sister    Rectal cancer Neg Hx    Stomach cancer Neg Hx    Social History   Social History Narrative   Married to [place].    HS education. Retired.    Former smoker. No drugs or ETOH.   Wears seatbelt.   Wears dentures.   Smoke detector in the home. Firearms  In the home.    Feels safe in his relationships.     Allergies as of 10/11/2023   No Known Allergies      Medication List        Accurate as of October 11, 2023  8:25 AM. If you have any questions, ask your nurse or doctor.          STOP taking these medications    benzonatate 200 MG capsule Commonly known as: TESSALON Stopped by: Felix Pacini   dextromethorphan-guaiFENesin 30-600 MG 12hr tablet Commonly known as: MUCINEX DM Stopped by: Felix Pacini   predniSONE 20 MG tablet Commonly known as: DELTASONE Stopped by: Felix Pacini       TAKE these medications    amLODipine 5 MG tablet Commonly known as: NORVASC Take 1 tablet (5 mg total) by mouth daily.   aspirin EC 81 MG tablet Take 81  mg by mouth daily.   atorvastatin 40 MG tablet Commonly known as: LIPITOR Take 1 tablet (40 mg total) by mouth daily.   diclofenac Sodium 1 % Gel Commonly known as: VOLTAREN Apply 2 g topically 4 (four) times daily.   esomeprazole 20 MG capsule Commonly known as: NEXIUM Take 20 mg by mouth every morning.   ferrous sulfate 324 MG Tbec Take 324 mg by mouth daily.   fluticasone 50 MCG/ACT nasal spray Commonly known as: FLONASE Place 1 spray into both nostrils in the morning and at bedtime.   furosemide 20 MG tablet Commonly known as: LASIX Take 0.5-1 tablets (10-20 mg total) by mouth daily as needed.   gabapentin 100 MG capsule Commonly known as: NEURONTIN Take 1-3 capsules (100-300 mg total) by mouth at bedtime.   lisinopril 40 MG tablet Commonly known as: ZESTRIL Take 1 tablet (40 mg total) by mouth daily.   metoprolol tartrate 25 MG tablet Commonly known as: LOPRESSOR Take 0.5 tablets (12.5 mg total) by mouth 2 (two) times daily.   nitroGLYCERIN 0.4 MG SL tablet Commonly known as: NITROSTAT Place 1 tablet (0.4 mg total) under the tongue every 5 (five) minutes as needed for chest pain.   Tdap 5-2.5-18.5 LF-MCG/0.5 injection Commonly known as: BOOSTRIX Inject 0.5 mLs into the muscle once for 1 dose. Started by: Felix Pacini       All past medical history, surgical history, allergies, family history, immunizations andmedications were updated in the EMR today and reviewed under the history and medication portions of their EMR.      ROS 14 pt review of systems performed and negative (unless mentioned in an HPI)  Objective: BP 118/66   Pulse (!) 57   Temp 98 F (  36.7 C)   Ht 5\' 10"  (1.778 m)   Wt 171 lb 3.2 oz (77.7 kg)   SpO2 96%   BMI 24.56 kg/m  Physical Exam Vitals and nursing note reviewed.  Constitutional:      General: He is not in acute distress.    Appearance: Normal appearance. He is not ill-appearing, toxic-appearing or diaphoretic.  HENT:      Head: Normocephalic and atraumatic.  Eyes:     General: No scleral icterus.       Right eye: No discharge.        Left eye: No discharge.     Extraocular Movements: Extraocular movements intact.     Pupils: Pupils are equal, round, and reactive to light.  Cardiovascular:     Rate and Rhythm: Normal rate and regular rhythm.  Pulmonary:     Effort: Pulmonary effort is normal. No respiratory distress.     Breath sounds: Normal breath sounds. No wheezing, rhonchi or rales.  Musculoskeletal:     Cervical back: Neck supple.     Right lower leg: No edema.     Left lower leg: No edema.  Lymphadenopathy:     Cervical: No cervical adenopathy.  Skin:    General: Skin is warm and dry.     Coloration: Skin is not jaundiced or pale.     Findings: No rash.  Neurological:     Mental Status: He is alert and oriented to person, place, and time. Mental status is at baseline.  Psychiatric:        Mood and Affect: Mood normal.        Behavior: Behavior normal.        Thought Content: Thought content normal.        Judgment: Judgment normal.    Diabetic Foot Exam - Simple   Simple Foot Form Diabetic Foot exam was performed with the following findings: Yes 10/11/2023  8:22 AM  Visual Inspection No deformities, no ulcerations, no other skin breakdown bilaterally: Yes Sensation Testing Intact to touch and monofilament testing bilaterally: Yes Pulse Check Posterior Tibialis and Dorsalis pulse intact bilaterally: Yes Comments     No results found.  Assessment/plan: BUNNY LOWDERMILK is a 83 y.o. male present for [facility] Iron deficiency anemia:  Follows with hematology   Controlled type 2 diabetes mellitus with complication, without long-term current use of insulin (HCC) Diet controlled A1c: 6.2 --> 6.0>5.7>> 5.9>5.8>6.0> 5.9> 5.3> 5.4> 5.3 > off meds> 5.5> 5.7> A1c collected today  - Would not advise much more weight loss . - Opthalmology: 11/2022 [place] Dr. Cherlynn Polo.  Reminded patient eye  exam is due in April - pna series UTD/completed  - flu shot up-to-date 2024 - foot exam completed 04/26/2023 - urine micro collected 04/26/2023-with elevation, repeat today if still elevated consider adding [place] or [place] to management.   Essential hypertension/Coronary artery disease involving native coronary artery of native heart without angina pectoris/History of ST elevation myocardial infarction (STEMI) Stable Continue lopressor 12.5 twice daily> monitor HR, if 40s, decrease to 1/2 tab once daily or discontinue Continue lisinopril 40 mg daily Continue amlodipine 5 mg qd Continue statin  - Low sodium diet encouraged. CBC, CMP, TSH and lipids collected today   Splenic Margicnal Zone B-cell lymphoma/thrombocytopenia/anemia of chronic disease/pancytopenia/lymphocytosis: - continue to  follow up with heme/onc.  - cbc collected today- baseline 25- 29> following with heme. No need to call critical lab  Thrombocytopenia (HCC) CBC collected today B12 deficiency B12 within normal range with every  4-week injection. Continue B12 injections by nurse visit 4 weeks controls B12 levels collected today B12 injection provided today  GERD/long-term PPI use -Prescribed Nexium by specialty team. -B12, magnesium and vitamin D levels collected today  Microalbuminuria Urine microalbumin collected if still above normal start farxiga, pt is on an arb  Routine general medical examination at a health care facility (Primary) Patient was encouraged to exercise greater than 150 minutes a week. Patient was encouraged to choose a diet filled with fresh fruits and vegetables, and lean meats. AVS provided to patient today for education/recommendation on gender specific health and safety maintenance. Colonoscopy: last screen 2022 Completed by Dr. Adela Lank.no further screenings d/t age Immunizations:  tdap due> printed, influenza UTD (encouraged yearly), PNA series completed. shingrix completed.  PSA:  > 80 not indicated  Return in about 24 weeks (around 03/27/2024) for Routine chronic condition follow-up.   Orders Placed This Encounter  Procedures   CBC   Comprehensive metabolic panel   Hemoglobin A1c   TSH   Urine Microalbumin w/creat. ratio   IBC + Ferritin   Vitamin D (25 hydroxy)   B12 and Folate Panel   Magnesium   Meds ordered this encounter  Medications   amLODipine (NORVASC) 5 MG tablet    Sig: Take 1 tablet (5 mg total) by mouth daily.    Dispense:  90 tablet    Refill:  1   atorvastatin (LIPITOR) 40 MG tablet    Sig: Take 1 tablet (40 mg total) by mouth daily.    Dispense:  90 tablet    Refill:  3   furosemide (LASIX) 20 MG tablet    Sig: Take 0.5-1 tablets (10-20 mg total) by mouth daily as needed.    Dispense:  90 tablet    Refill:  1   lisinopril (ZESTRIL) 40 MG tablet    Sig: Take 1 tablet (40 mg total) by mouth daily.    Dispense:  90 tablet    Refill:  1   metoprolol tartrate (LOPRESSOR) 25 MG tablet    Sig: Take 0.5 tablets (12.5 mg total) by mouth 2 (two) times daily.    Dispense:  90 tablet    Refill:  1   gabapentin (NEURONTIN) 100 MG capsule    Sig: Take 1-3 capsules (100-300 mg total) by mouth at bedtime.    Dispense:  90 capsule    Refill:  5   Tdap (BOOSTRIX) 5-2.5-18.5 LF-MCG/0.5 injection    Sig: Inject 0.5 mLs into the muscle once for 1 dose.    Dispense:  0.5 mL    Refill:  0   Referral Orders  No referral(s) requested today    Note is dictated utilizing voice recognition software. Although note has been proof read prior to signing, occasional typographical errors still can be missed. If any questions arise, please do not hesitate to call for verification.  Electronically signed by: Felix Pacini, DO [facility] [place]

## 2023-10-11 NOTE — Patient Instructions (Signed)

## 2023-10-12 ENCOUNTER — Telehealth: Payer: Self-pay | Admitting: Family Medicine

## 2023-10-12 DIAGNOSIS — R809 Proteinuria, unspecified: Secondary | ICD-10-CM

## 2023-10-12 MED ORDER — DAPAGLIFLOZIN PROPANEDIOL 10 MG PO TABS: 10.0000 mg | ORAL_TABLET | Freq: Every day | ORAL | 5 refills | Status: DC

## 2023-10-12 NOTE — Telephone Encounter (Signed)
 Please call patient Blood cell counts are stable at 25.7 Electrolytes, liver function and kidney function are normal Iron levels in the blood are normal with low iron storage, continue iron supplement Vitamin D and B12 levels are good Thyroid function is normal A1c/diabetes went up to 6.0. Lastly the protein in his urine is increasing now up to 89.2.  This is in the level of microalbuminuria.  I have called in the [place] for him to start which covers both A1c and it provides kidney protection in patients to have microalbuminuria.  There is also added cardiac benefits to this medicine.  -Lastly I did place a referral to nephrology, which is a kidney specialist.  Would like for him to establish just for them to evaluate due to the increasing protein in the urine.

## 2023-10-12 NOTE — Telephone Encounter (Signed)
 Pt given results/recommendations.

## 2023-10-12 NOTE — Telephone Encounter (Signed)
 Result c/w his usual.

## 2023-10-17 ENCOUNTER — Ambulatory Visit (HOSPITAL_BASED_OUTPATIENT_CLINIC_OR_DEPARTMENT_OTHER): Payer: Medicare Other | Attending: Podiatry | Admitting: Physical Therapy

## 2023-10-17 DIAGNOSIS — R2689 Other abnormalities of gait and mobility: Secondary | ICD-10-CM | POA: Diagnosis not present

## 2023-10-17 NOTE — Therapy (Signed)
 OUTPATIENT PHYSICAL THERAPY LOWER EXTREMITY EVALUATION   Patient Name: Marcus Taylor MRN: [id number] DOB:10-Jul-1941, 83 y.o., male Today's Date: 10/17/2023  END OF SESSION:  PT End of Session - 10/17/23 1444     Visit Number 2    Number of Visits 6    Date for PT Re-Evaluation 10/29/23    PT Start Time 0930    PT Stop Time 0950    PT Time Calculation (min) 20 min    Activity Tolerance Patient tolerated treatment well    Behavior During Therapy [facility] for tasks assessed/performed             Past Medical History:  Diagnosis Date   Anemia    Arthritis    Cervical radiculopathy 12/22/2019   Colonic polyp    Concussion 03/05/2018   Coronary artery disease    stents x 3 (03-2014)   Diabetes mellitus    Diverticulosis    throughout entire colon   History of colon polyps 01/27/2016   04/2017:  Three sessile polyps were found in the ascending colon. The polyps were 3 mm in size.  These polyps were removed with a cold biopsy forceps. Resection and retrieval were  complete.  - Two sessile polyps were found in the ascending colon. The polyps were 5 to 6 mm in size.  These polyps were removed with a cold snare. Resection and retrieval were complete.  - A 3 mm polyp was found in the    History of total right knee replacement 07/09/2018   HTN (hypertension)    Hypertriglyceridemia    Ingrown toenail 10/21/2019   Kidney stone    Left hamstring injury 11/02/2020   Lymphocytosis    Metatarsal fracture left   left 5th proximal phalanx    Neck pain 02/19/2018   OSA (obstructive sleep apnea)    "Mild" by records   PONV (postoperative nausea and vomiting)    first time, no problem since then   RBBB 01/2016   Right inguinal hernia 02/03/2016   ? 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   Splenomegaly    STEMI (ST elevation myocardial infarction) [facility])    August 2015   Subarachnoid hematoma (HCC) 03/12/2018   Subarachnoid hemorrhage  (HCC) 03/05/2018   Past Surgical History:  Procedure Laterality Date   CARDIAC CATHETERIZATION     stents x 3 (03/2014)   CHOLECYSTECTOMY     INGUINAL HERNIA REPAIR Right 05/27/2015   Procedure: LAPAROSCOPIC REPAIR RIGHT  INGUINAL HERNIA;  Surgeon: Gaynelle Adu, MD;  Location: [facility] ORS;  Service: General;  Laterality: Right;   INSERTION OF MESH Right 05/27/2015   Procedure: INSERTION OF MESH;  Surgeon: Gaynelle Adu, MD;  Location: [facility];  Service: General;  Laterality: Right;   LUMBAR DISC SURGERY     TONSILLECTOMY AND ADENOIDECTOMY     TOTAL KNEE ARTHROPLASTY Right 07/09/2018   Procedure: RIGHT TOTAL KNEE ARTHROPLASTY;  Surgeon: Valeria Batman, MD;  Location: [facility] OR;  Service: Orthopedics;  Laterality: Right;   Patient Active Problem List   Diagnosis Date Noted   Encounter for monitoring long-term proton pump inhibitor therapy 10/11/2023   Gastroesophageal reflux disease without esophagitis 10/11/2023   Microalbuminuria 10/11/2023   B12 deficiency 04/26/2023   Carpal tunnel syndrome of right wrist 06/14/2022   Type 2 diabetes mellitus with hyperlipidemia (HCC) 07/04/2021   Liver cirrhosis (HCC) 07/25/2018   Pancytopenia, acquired (HCC) 04/19/2018   Primary osteoarthritis of right knee 04/19/2018  Decreased hearing of both ears 03/20/2018   Splenomegaly 03/12/2018   Enlarged prostate without lower urinary tract symptoms (luts) 03/12/2018   IDA (iron deficiency anemia) 08/09/2016   Thrombocytopenia (HCC) 05/30/2016   History of ST elevation myocardial infarction (STEMI) 01/27/2016   CAD (coronary artery disease) 01/27/2016   Anemia in neoplastic disease 05/25/2014   Splenic marginal zone b-cell lymphoma (HCC) 12/18/2011   HTN (hypertension)     PCP: Felix Pacini DO   REFERRING PROVIDER: Sarina Ill DO  REFERRING DIAG: Right foot tendon rupture.   THERAPY DIAG:  Other abnormalities of gait and mobility  Rationale for Evaluation and Treatment: Rehabilitation  ONSET  DATE:   SUBJECTIVE:   SUBJECTIVE STATEMENT: Patient reports that his foot has not improved at all and now his ankle Is hurting. He has been working on his exercises for 3 weeks.    Eval: About 4 months ago the patient was turning over in bed and he felt a pop in the top of his foot. He was found to have a rupture of one of his foot extensors. He has had difficulty lifting his foot since that point. He feels as though when he is walking his foot slaps the floor.  PERTINENT HISTORY: Arthritis in multiple joints including hands, CAD, DM2, bilateral peripheral neuropathy, past history of cervical radiculopathy, lumbar disc surgery, total knee arthroplasty 2019, past history of subarachnoid hematoma 03/12/2018 no residual effects PAIN:  Are you having pain? Yes: NPRS scale: 0/10 at this time  Pain location: was in the top of the foot Pain description: aching  Aggravating factors: walking and activity  Relieving factors: hasn't hurt in some time  PRECAUTIONS: None  RED FLAGS: None   WEIGHT BEARING RESTRICTIONS: No  FALLS:  Has patient fallen in last 6 months? No  LIVING ENVIRONMENT: No steps  OCCUPATION:  Retired   Hobbies:    PLOF: Independent  PATIENT GOALS: To have better control of his foot  NEXT MD VISIT: Nothing scheduled  OBJECTIVE:  Note: Objective measures were completed at Evaluation unless otherwise noted.  DIAGNOSTIC FINDINGS: Patient had x-rays.  Severe OA of first MTP joint  PATIENT SURVEYS:  LEFS 67/80  COGNITION: Overall cognitive status: Within functional limits for tasks assessed     SENSATION: Peripheral neuropathy   EDEMA:  Selling on the top of the foot near the talus   MUSCLE LENGTH:  POSTURE: No Significant postural limitations  PALPATION: No unexpected TTP  LOWER EXTREMITY ROM:  Active ROM Right eval Left eval  Hip flexion    Hip extension    Hip abduction    Hip adduction    Hip internal rotation    Hip external rotation     Knee flexion    Knee extension    Ankle dorsiflexion Neutral  5  Ankle plantarflexion WNL WNL  Ankle inversion    Ankle eversion     (Blank rows = not tested)  LOWER EXTREMITY MMT:  MMT Right eval Left eval  Hip flexion    Hip extension    Hip abduction    Hip adduction    Hip internal rotation    Hip external rotation    Knee flexion    Knee extension    Ankle dorsiflexion 3+ 5  Ankle plantarflexion 5 5  Ankle inversion 5 5  Ankle eversion 5 5   (Blank rows = not tested)   FUNCTIONAL TESTS:   GAIT: Mild foot slap noted with ambulation. No toe catching noted.  TREATMENT DATE:    Self care: reviewed HEP. No exercises exacerbated pain. He reports the pain is only at night. Therapy palpated patients ankle. He had no TTP. We reviewed ptions for ASO's and AFO's.   Eval:  Reviewed POC over the next few weeks. We will have the patient work on his exercises for 2 weeks.   Exercises - Standing Gastroc Stretch at Counter  - 1 x daily - 7 x weekly - 3 sets - 3 reps - 20 hold - Seated Gastroc Stretch with Strap  - 1 x daily - 7 x weekly - 3 sets - 3 reps - 20 hold - Seated Toe Raise  - 1 x daily - 7 x weekly - 3 sets - 10 reps - Seated Great Toe Extension  - 1 x daily - 7 x weekly - 3 sets - 10 reps - Ankle Dorsiflexion with Resistance  - 1 x daily - 7 x weekly - 3 sets - 10 reps   PATIENT EDUCATION:  Education details: HEP, symptom management  Person educated: Patient Education method: Explanation, Demonstration, Tactile cues, Verbal cues, and Handouts Education comprehension: verbalized understanding, returned demonstration, verbal cues required, tactile cues required, and needs further education  HOME EXERCISE PROGRAM: Access Code: [email or web address] URL: [email or web address] Date: 09/18/2023 Prepared by: Lorayne Bender  ASSESSMENT:  CLINICAL IMPRESSION: Patient has been working on his exercises consistently. He has had no improvement. He is now having pain. There are really no other exercises to gave him at this point. He has no active movement into DF. He was shown AFO options. He will go back to the MD soon.   Eval: Patient is a 83 year old male who presents with right functional foot drop.  He is able to dorsiflex his foot against gravity.  When he walks he feels like his foot slaps the ground.  He suffered a tendon tear approximately 4 months ago.  For the first month he had pain.  At this time he has no pain.  He has difficulty lifting his great toe compared to his nonaffected side.  He has had any falls.  He just feels like at this time he would like to improve the control of his foot.  He would benefit from skilled therapy to improve dorsiflexion strength and improve safety with gait..   OBJECTIVE IMPAIRMENTS: Abnormal gait, decreased activity tolerance, and decreased strength.   ACTIVITY LIMITATIONS: stairs, transfers, and locomotion level  PARTICIPATION LIMITATIONS: cleaning, shopping, community activity, occupation, and yard work  PERSONAL FACTORS: 3+ comorbidities: Peripheral neuropathy, lumbar surgery , Knee replacement   are also affecting patient's functional outcome.   REHAB POTENTIAL: Excellent  CLINICAL DECISION MAKING: Stable/uncomplicated  EVALUATION COMPLEXITY: Low   GOALS: Goals reviewed with patient? Yes  SHORT TERM GOALS: Target date: 10/09/2023   Patient will increase active dorsiflexion range of motion to 5 degrees Baseline: Goal status: INITIAL  2.  Patient will increase dorsiflexion strength to 4+ out of 5 Baseline:  Goal status: INITIAL  3.  Patient will demonstrate within normal limit Baseline:  Goal s patient will be independent with basic HEP tatus: INITIAL  LONG TERM GOALS: Target date: 10/30/2023    Patient will ambulate community distances  without feeling like he has a foot drop. Baseline:  Goal status: INITIAL  2.  Patient will have complete HEP to promote dorsiflexion strength and overall gait mobility Baseline:  Goal status: INITIAL    PLAN:  PT FREQUENCY: 1x/week  PT DURATION: 6 weeks  PLANNED INTERVENTIONS:  Therapeutic exercises, Therapeutic activity, Neuromuscular re-education, Balance training, Gait training, Patient/Family education, Self Care, Joint mobilization, Stair training, DME instructions, Aquatic Therapy, Dry Needling, Electrical stimulation, Cryotherapy, Moist heat, Taping, Manual therapy, and Re-evaluation.   PLAN FOR NEXT SESSION:  D/C to HEP  Dessie Coma, PT 10/17/2023, 2:46 PM

## 2023-10-24 ENCOUNTER — Ambulatory Visit (INDEPENDENT_AMBULATORY_CARE_PROVIDER_SITE_OTHER): Payer: Medicare Other

## 2023-10-24 ENCOUNTER — Encounter (HOSPITAL_BASED_OUTPATIENT_CLINIC_OR_DEPARTMENT_OTHER): Payer: Medicare Other | Admitting: Physical Therapy

## 2023-10-24 DIAGNOSIS — E538 Deficiency of other specified B group vitamins: Secondary | ICD-10-CM

## 2023-10-24 NOTE — Progress Notes (Signed)
Pt here for monthly B12 injection per [facility]  B12 given IM, and pt tolerated injection well.  Next B12 injection scheduled for 1 month

## 2023-11-08 ENCOUNTER — Encounter: Payer: Self-pay | Admitting: Podiatry

## 2023-11-08 ENCOUNTER — Ambulatory Visit: Payer: Medicare Other | Admitting: Podiatry

## 2023-11-08 DIAGNOSIS — D689 Coagulation defect, unspecified: Secondary | ICD-10-CM | POA: Diagnosis not present

## 2023-11-08 DIAGNOSIS — M79676 Pain in unspecified toe(s): Secondary | ICD-10-CM

## 2023-11-08 DIAGNOSIS — B351 Tinea unguium: Secondary | ICD-10-CM | POA: Diagnosis not present

## 2023-11-08 DIAGNOSIS — M66871 Spontaneous rupture of other tendons, right ankle and foot: Secondary | ICD-10-CM | POA: Diagnosis not present

## 2023-11-08 NOTE — Progress Notes (Signed)
  Chief Complaint  Patient presents with   [facility]    RM#11 [facility]     Subjective:  83 y.o. returns the office today for painful, elongated, thickened toenails which he cannot trim himself.    He also completed physical therapy for the tender option right side but he states it feels like it made the pain worse.  Still feels like he slaps his foot when he walks.  PCP: Felix Pacini A, DO Last seen 10/11/2023 Last A1c: 6.0 on 11/08/2023  Objective: AAO 3, NAD DP/PT pulses palpable, CRT less than 3 seconds  Nails hypertrophic, dystrophic, elongated, brittle, discolored 10. There is tenderness overlying the nails 1-5 bilaterally. There is no surrounding erythema or drainage along the nail sites. There is to be a rupture of the extensor tendon on the right foot on the EHL.  He is not able to dorsiflex his hallux.  Tenderness is localized.  No area pinpoint tenderness. No pain with calf compression, swelling, warmth, erythema. Contracture digits is noted.  No pinpoint area of discomfort dorsally on either foot.   Assessment: Patient presents with symptomatic onychomycosis; neuropathic foot pain; consider tendon rupture  Plan: Symptomatic otomycosis -Nails sharply debrided 10 without complication/bleeding.  Tendon rupture -Regards to his extensor tendon rupture we discussed both conservative as well as surgical options.  He completed physical therapy without any improvement.  We discussed surgical invention.  MRI ordered.   Vivi Barrack DPM

## 2023-11-13 ENCOUNTER — Ambulatory Visit
Admission: RE | Admit: 2023-11-13 | Discharge: 2023-11-13 | Discharge status: Home / Self Care | Source: Ambulatory Visit | Attending: Podiatry

## 2023-11-13 DIAGNOSIS — M19071 Primary osteoarthritis, right ankle and foot: Secondary | ICD-10-CM | POA: Diagnosis not present

## 2023-11-13 DIAGNOSIS — M25571 Pain in right ankle and joints of right foot: Secondary | ICD-10-CM | POA: Diagnosis not present

## 2023-11-13 DIAGNOSIS — M66871 Spontaneous rupture of other tendons, right ankle and foot: Secondary | ICD-10-CM

## 2023-11-13 DIAGNOSIS — M7731 Calcaneal spur, right foot: Secondary | ICD-10-CM | POA: Diagnosis not present

## 2023-11-13 DIAGNOSIS — M65861 Other synovitis and tenosynovitis, right lower leg: Secondary | ICD-10-CM | POA: Diagnosis not present

## 2023-11-21 ENCOUNTER — Ambulatory Visit (INDEPENDENT_AMBULATORY_CARE_PROVIDER_SITE_OTHER)

## 2023-11-21 DIAGNOSIS — E538 Deficiency of other specified B group vitamins: Secondary | ICD-10-CM

## 2023-11-21 MED ORDER — CYANOCOBALAMIN 1000 MCG/ML IJ SOLN
1000.0000 ug | Freq: Once | INTRAMUSCULAR | Status: AC
  Administered 2023-11-21: 1000 ug via INTRAMUSCULAR

## 2023-11-21 NOTE — Progress Notes (Signed)
Pt here for monthly B12 injection per [facility]  B12 given IM, and pt tolerated injection well.  Next B12 injection scheduled for 1 month

## 2023-11-29 ENCOUNTER — Encounter: Payer: Self-pay | Admitting: Podiatry

## 2023-12-04 NOTE — Telephone Encounter (Signed)
 Dr. Clydia Dart,  Marcus Taylor got your message regarding surgery. He would like to come in a talk with you about it instead of communicating thru MyChart. Your 1st aval is not until 5/19. Can you work him in sooner or he said it is ok, if you can call him to further discuss.

## 2023-12-10 DIAGNOSIS — H2513 Age-related nuclear cataract, bilateral: Secondary | ICD-10-CM | POA: Diagnosis not present

## 2023-12-10 DIAGNOSIS — Z7984 Long term (current) use of oral hypoglycemic drugs: Secondary | ICD-10-CM | POA: Diagnosis not present

## 2023-12-10 DIAGNOSIS — E113292 Type 2 diabetes mellitus with mild nonproliferative diabetic retinopathy without macular edema, left eye: Secondary | ICD-10-CM | POA: Diagnosis not present

## 2023-12-10 DIAGNOSIS — H524 Presbyopia: Secondary | ICD-10-CM | POA: Diagnosis not present

## 2023-12-10 LAB — HM DIABETES EYE EXAM

## 2023-12-13 ENCOUNTER — Encounter: Payer: Self-pay | Admitting: Hematology and Oncology

## 2023-12-13 ENCOUNTER — Other Ambulatory Visit: Payer: Self-pay | Admitting: *Deleted

## 2023-12-13 ENCOUNTER — Inpatient Hospital Stay: Payer: Medicare Other | Admitting: Hematology and Oncology

## 2023-12-13 ENCOUNTER — Inpatient Hospital Stay: Payer: Medicare Other | Attending: Hematology and Oncology

## 2023-12-13 ENCOUNTER — Other Ambulatory Visit: Payer: Self-pay | Admitting: Hematology and Oncology

## 2023-12-13 VITALS — BP 111/49 | HR 81 | Temp 98.1°F | Resp 18 | Ht 70.0 in | Wt 171.2 lb

## 2023-12-13 DIAGNOSIS — D509 Iron deficiency anemia, unspecified: Secondary | ICD-10-CM | POA: Insufficient documentation

## 2023-12-13 DIAGNOSIS — C8307 Small cell B-cell lymphoma, spleen: Secondary | ICD-10-CM

## 2023-12-13 DIAGNOSIS — D696 Thrombocytopenia, unspecified: Secondary | ICD-10-CM

## 2023-12-13 DIAGNOSIS — D500 Iron deficiency anemia secondary to blood loss (chronic): Secondary | ICD-10-CM | POA: Diagnosis not present

## 2023-12-13 DIAGNOSIS — C8300 Small cell B-cell lymphoma, unspecified site: Secondary | ICD-10-CM | POA: Diagnosis not present

## 2023-12-13 LAB — CBC WITH DIFFERENTIAL (CANCER CENTER ONLY)
Abs Immature Granulocytes: 0.02 10*3/uL (ref 0.00–0.07)
Basophils Absolute: 0.1 10*3/uL (ref 0.0–0.1)
Basophils Relative: 0 %
Eosinophils Absolute: 0.3 10*3/uL (ref 0.0–0.5)
Eosinophils Relative: 1 %
HCT: 31.6 % — ABNORMAL LOW (ref 39.0–52.0)
Hemoglobin: 9.8 g/dL — ABNORMAL LOW (ref 13.0–17.0)
Immature Granulocytes: 0 %
Lymphocytes Relative: 82 %
Lymphs Abs: 16.3 10*3/uL — ABNORMAL HIGH (ref 0.7–4.0)
MCH: 26.7 pg (ref 26.0–34.0)
MCHC: 31 g/dL (ref 30.0–36.0)
MCV: 86.1 fL (ref 80.0–100.0)
Monocytes Absolute: 1.1 10*3/uL — ABNORMAL HIGH (ref 0.1–1.0)
Monocytes Relative: 6 %
Neutro Abs: 2.1 10*3/uL (ref 1.7–7.7)
Neutrophils Relative %: 11 %
Platelet Count: 144 10*3/uL — ABNORMAL LOW (ref 150–400)
RBC: 3.67 MIL/uL — ABNORMAL LOW (ref 4.22–5.81)
RDW: 16 % — ABNORMAL HIGH (ref 11.5–15.5)
Smear Review: NORMAL
WBC Count: 19.9 10*3/uL — ABNORMAL HIGH (ref 4.0–10.5)
WBC Morphology: ABNORMAL
nRBC: 0 % (ref 0.0–0.2)

## 2023-12-13 LAB — CMP (CANCER CENTER ONLY)
ALT: 12 U/L (ref 0–44)
AST: 28 U/L (ref 15–41)
Albumin: 4.3 g/dL (ref 3.5–5.0)
Alkaline Phosphatase: 59 U/L (ref 38–126)
Anion gap: 4 — ABNORMAL LOW (ref 5–15)
BUN: 29 mg/dL — ABNORMAL HIGH (ref 8–23)
CO2: 31 mmol/L (ref 22–32)
Calcium: 8.9 mg/dL (ref 8.9–10.3)
Chloride: 108 mmol/L (ref 98–111)
Creatinine: 0.86 mg/dL (ref 0.61–1.24)
GFR, Estimated: >60 mL/min (ref >60)
Glucose, Bld: 106 mg/dL — ABNORMAL HIGH (ref 70–99)
Potassium: 4.2 mmol/L (ref 3.5–5.1)
Sodium: 143 mmol/L (ref 135–145)
Total Bilirubin: 0.4 mg/dL (ref 0.0–1.2)
Total Protein: 6.2 g/dL — ABNORMAL LOW (ref 6.5–8.1)

## 2023-12-13 LAB — IRON AND IRON BINDING CAPACITY (CC-WL,HP ONLY)
Iron: 43 ug/dL — ABNORMAL LOW (ref 45–182)
Saturation Ratios: 13 % — ABNORMAL LOW (ref 17.9–39.5)
TIBC: 336 ug/dL (ref 250–450)
UIBC: 293 ug/dL (ref 117–376)

## 2023-12-13 LAB — FERRITIN: Ferritin: 25 ng/mL (ref 24–336)

## 2023-12-13 NOTE — Progress Notes (Signed)
 [facility] OFFICE PROGRESS NOTE  Patient Care Team: Mariel Shope, DO as PCP - General (Family Medicine) Almeda Jacobs, MD as Consulting Physician (Hematology and Oncology) Linnea Richards, MD as Referring Physician (Cardiology) Tivis Forster, MD as Consulting Physician (Neurosurgery) Ranny Bye, OD as Referring Physician Charity Conch, DPM as Consulting Physician (Podiatry) Ltanya Rummer, MD as Consulting Physician (Orthopedic Surgery)  Assessment & Plan Splenic marginal zone b-cell lymphoma [facility]) Patient was diagnosed with splenic marginal B-cell lymphoma since 2013 and has never needed treatment He has fluctuation of his white blood cell count and lymphocyte count but overall asymptomatic He had very mild thrombocytopenia but not symptomatic For now, I recommend close observation The patient is educated to watch for signs and symptoms of cancer recurrence I will see him again in 12 months for further follow-up Thrombocytopenia (HCC) He has mild intermittent thrombocytopenia but not symptomatic Observe closely There is no contraindication for him to take antiplatelet agents Iron deficiency anemia due to chronic blood loss He has been on oral iron supplement for almost 2 years Iron studies are pending He is anemic If iron studies show no improvement on oral iron supplement, I will bring him in and prescribe intravenous iron infusion  No orders of the defined types were placed in this encounter.    Almeda Jacobs, MD  INTERVAL HISTORY: he returns for surveillance follow-up for splenic marginal B cell lymphoma and iron deficiency anemia with chronic thrombocytopenia He is doing well Appetite is stable  he is not symptomatic from anemia The patient denies any recent signs or symptoms of bleeding such as spontaneous epistaxis, hematuria or hematochezia. Denies recent infection  PHYSICAL EXAMINATION: ECOG PERFORMANCE STATUS: 1 - Symptomatic but completely  ambulatory  Vitals:   12/13/23 0920  BP: (!) 111/49  Pulse: 81  Resp: 18  Temp: 98.1 F (36.7 C)  SpO2: 100%   Filed Weights   12/13/23 0920  Weight: 171 lb 3.2 oz (77.7 kg)    Relevant data reviewed during this visit included CBC, CMP and iron studies

## 2023-12-13 NOTE — Assessment & Plan Note (Addendum)
 Patient was diagnosed with splenic marginal B-cell lymphoma since 2013 and has never needed treatment He has fluctuation of his white blood cell count and lymphocyte count but overall asymptomatic He had very mild thrombocytopenia but not symptomatic For now, I recommend close observation The patient is educated to watch for signs and symptoms of cancer recurrence I will see him again in 12 months for further follow-up

## 2023-12-13 NOTE — Assessment & Plan Note (Addendum)
 He has been on oral iron supplement for almost 2 years Iron studies are pending He is anemic If iron studies show no improvement on oral iron supplement, I will bring him in and prescribe intravenous iron infusion

## 2023-12-13 NOTE — Assessment & Plan Note (Addendum)
He has mild intermittent thrombocytopenia but not symptomatic Observe closely There is no contraindication for him to take antiplatelet agents 

## 2023-12-14 ENCOUNTER — Telehealth: Payer: Self-pay | Admitting: *Deleted

## 2023-12-14 NOTE — Telephone Encounter (Signed)
-----   Message from Almeda Jacobs sent at 12/14/2023 12:51 PM EDT ----- Suella Emmer, thanks ----- Message ----- From: Reggie Caper, LPN Sent: 08/19/1094  12:51 PM EDT To: Almeda Jacobs, MD  He said he would prefer to take the oral iron 2x daily and later have you to check his labs and if it doesn't come up he will do the IV iron ----- Message ----- From: Almeda Jacobs, MD Sent: 12/14/2023   8:07 AM EDT To: Reggie Caper, LPN  His iron studies came back, still low I recommend one dose of IV iron if he agrees, If not, he can continue taking the oral iron supplement but increase it to twice daily

## 2023-12-14 NOTE — Telephone Encounter (Signed)
 Per Dr.GOrsuch, called pt with message below. Pt wants to continue oral iron 2x daily, recheck labs in a few months and if they are still low, he will do iron iv. Provider made aware and pt verbalized understanding.

## 2023-12-17 ENCOUNTER — Encounter: Payer: Self-pay | Admitting: Family Medicine

## 2023-12-20 DIAGNOSIS — C8580 Other specified types of non-Hodgkin lymphoma, unspecified site: Secondary | ICD-10-CM | POA: Diagnosis not present

## 2023-12-20 DIAGNOSIS — N182 Chronic kidney disease, stage 2 (mild): Secondary | ICD-10-CM | POA: Diagnosis not present

## 2023-12-20 DIAGNOSIS — E1122 Type 2 diabetes mellitus with diabetic chronic kidney disease: Secondary | ICD-10-CM | POA: Diagnosis not present

## 2023-12-20 DIAGNOSIS — I129 Hypertensive chronic kidney disease with stage 1 through stage 4 chronic kidney disease, or unspecified chronic kidney disease: Secondary | ICD-10-CM | POA: Diagnosis not present

## 2023-12-21 ENCOUNTER — Ambulatory Visit (INDEPENDENT_AMBULATORY_CARE_PROVIDER_SITE_OTHER)

## 2023-12-21 DIAGNOSIS — E538 Deficiency of other specified B group vitamins: Secondary | ICD-10-CM | POA: Diagnosis not present

## 2023-12-21 MED ORDER — CYANOCOBALAMIN 1000 MCG/ML IJ SOLN
1000.0000 ug | Freq: Once | INTRAMUSCULAR | Status: AC
  Administered 2023-12-21: 1000 ug via INTRAMUSCULAR

## 2023-12-21 NOTE — Progress Notes (Signed)
Pt here for monthly B12 injection per [facility]  B12 given IM, and pt tolerated injection well.  Next B12 injection scheduled for 1 month

## 2023-12-31 ENCOUNTER — Ambulatory Visit: Admitting: Podiatry

## 2024-01-21 ENCOUNTER — Ambulatory Visit (INDEPENDENT_AMBULATORY_CARE_PROVIDER_SITE_OTHER)

## 2024-01-21 DIAGNOSIS — E538 Deficiency of other specified B group vitamins: Secondary | ICD-10-CM | POA: Diagnosis not present

## 2024-01-21 MED ORDER — CYANOCOBALAMIN 1000 MCG/ML IJ SOLN
1000.0000 ug | Freq: Once | INTRAMUSCULAR | Status: AC
Start: 2024-01-21 — End: 2024-01-21
  Administered 2024-01-21: 1000 ug via INTRAMUSCULAR

## 2024-01-21 NOTE — Progress Notes (Signed)
Pt here for monthly B12 injection per [facility]  B12 given IM, and pt tolerated injection well.  Next B12 injection scheduled for 1 month

## 2024-01-27 ENCOUNTER — Other Ambulatory Visit: Payer: Self-pay | Admitting: Family Medicine

## 2024-02-08 ENCOUNTER — Ambulatory Visit: Admitting: Podiatry

## 2024-02-08 DIAGNOSIS — M79676 Pain in unspecified toe(s): Secondary | ICD-10-CM

## 2024-02-08 DIAGNOSIS — B351 Tinea unguium: Secondary | ICD-10-CM

## 2024-02-08 DIAGNOSIS — M66871 Spontaneous rupture of other tendons, right ankle and foot: Secondary | ICD-10-CM

## 2024-02-08 NOTE — Progress Notes (Signed)
 Subjective:  Chief Complaint  Patient presents with   RFC    RM#12 RFC     83 y.o. returns the office today for painful, elongated, thickened toenails which he cannot trim himself.    States in regards to the right ankle, tendon.  He gets ongoing discomfort if he has been on his feet for a long time. He does not report any falls. No increased swelling or new concerns.   PCP: Catherine Fuller A, DO Last seen 10/11/2023 Last A1c: 6.0 on 11/08/2023  Objective: AAO 3, NAD DP/PT pulses palpable, CRT less than 3 seconds  Nails hypertrophic, dystrophic, elongated, brittle, discolored 10. There is tenderness overlying the nails 1-5 bilaterally. There is no surrounding erythema or drainage along the nail sites. There is to be a rupture of the extensor tendon on the right foot on the EHL.  There does appear to be increased strength although this somewhat decreased compared to the contralateral extremity.  Unable to appreciate any tenderness on exam.  There is no edema. No pain with calf compression, swelling, warmth, erythema. Contracture digits is noted.  No pinpoint area of discomfort dorsally on either foot.   Assessment: Patient presents with symptomatic onychomycosis; neuropathic foot pain; consider tendon rupture  Plan: Symptomatic otomycosis -Nails sharply debrided 10 without complication/bleeding.  Tendon rupture -Regards to his extensor tendon rupture we discussed both conservative as well as surgical options again today.  He has completed physical therapy.  He tried using the brace but it caused more discomfort so he is worn his regular shoe.  Discussed if he continues to have any gait instability recommend surgical intervention.  He is not ready to proceed with that at this time.  Donnice JONELLE Fees DPM

## 2024-02-18 ENCOUNTER — Ambulatory Visit

## 2024-02-18 DIAGNOSIS — E538 Deficiency of other specified B group vitamins: Secondary | ICD-10-CM | POA: Diagnosis not present

## 2024-02-18 NOTE — Progress Notes (Signed)
Pt here for monthly B12 injection per Dr.Kuneff   B12 1000mcg given IM, and pt tolerated injection well.   Next B12 injection scheduled for 1 month.       

## 2024-03-17 ENCOUNTER — Ambulatory Visit (INDEPENDENT_AMBULATORY_CARE_PROVIDER_SITE_OTHER)

## 2024-03-17 DIAGNOSIS — E538 Deficiency of other specified B group vitamins: Secondary | ICD-10-CM | POA: Diagnosis not present

## 2024-03-17 NOTE — Progress Notes (Signed)
 Pt here for monthly B12 injection per Dr Catherine  Last B12 injection: 7/7   B12 1000mcg given IM, and pt tolerated injection well.  Next B12 injection scheduled for: 1 month

## 2024-03-27 ENCOUNTER — Ambulatory Visit: Payer: Medicare Other | Admitting: Family Medicine

## 2024-03-27 ENCOUNTER — Encounter: Payer: Self-pay | Admitting: Family Medicine

## 2024-03-27 ENCOUNTER — Telehealth: Payer: Self-pay

## 2024-03-27 VITALS — BP 120/74 | HR 66 | Temp 97.9°F | Wt 176.0 lb

## 2024-03-27 DIAGNOSIS — D500 Iron deficiency anemia secondary to blood loss (chronic): Secondary | ICD-10-CM

## 2024-03-27 DIAGNOSIS — I252 Old myocardial infarction: Secondary | ICD-10-CM | POA: Diagnosis not present

## 2024-03-27 DIAGNOSIS — I10 Essential (primary) hypertension: Secondary | ICD-10-CM | POA: Diagnosis not present

## 2024-03-27 DIAGNOSIS — D630 Anemia in neoplastic disease: Secondary | ICD-10-CM | POA: Diagnosis not present

## 2024-03-27 DIAGNOSIS — D61818 Other pancytopenia: Secondary | ICD-10-CM | POA: Diagnosis not present

## 2024-03-27 DIAGNOSIS — R42 Dizziness and giddiness: Secondary | ICD-10-CM | POA: Diagnosis not present

## 2024-03-27 DIAGNOSIS — M1711 Unilateral primary osteoarthritis, right knee: Secondary | ICD-10-CM

## 2024-03-27 DIAGNOSIS — R55 Syncope and collapse: Secondary | ICD-10-CM | POA: Diagnosis not present

## 2024-03-27 DIAGNOSIS — R7309 Other abnormal glucose: Secondary | ICD-10-CM | POA: Diagnosis not present

## 2024-03-27 DIAGNOSIS — R809 Proteinuria, unspecified: Secondary | ICD-10-CM

## 2024-03-27 DIAGNOSIS — K7460 Unspecified cirrhosis of liver: Secondary | ICD-10-CM | POA: Diagnosis not present

## 2024-03-27 DIAGNOSIS — I2510 Atherosclerotic heart disease of native coronary artery without angina pectoris: Secondary | ICD-10-CM | POA: Diagnosis not present

## 2024-03-27 DIAGNOSIS — C8307 Small cell B-cell lymphoma, spleen: Secondary | ICD-10-CM | POA: Diagnosis not present

## 2024-03-27 LAB — COMPREHENSIVE METABOLIC PANEL WITH GFR
ALT: 15 U/L (ref 0–53)
AST: 32 U/L (ref 0–37)
Albumin: 4.4 g/dL (ref 3.5–5.2)
Alkaline Phosphatase: 60 U/L (ref 39–117)
BUN: 23 mg/dL (ref 6–23)
CO2: 32 meq/L (ref 19–32)
Calcium: 9.3 mg/dL (ref 8.4–10.5)
Chloride: 104 meq/L (ref 96–112)
Creatinine, Ser: 0.82 mg/dL (ref 0.40–1.50)
GFR: 81.63 mL/min (ref >60.00)
Glucose, Bld: 102 mg/dL — ABNORMAL HIGH (ref 70–99)
Potassium: 4.2 meq/L (ref 3.5–5.1)
Sodium: 143 meq/L (ref 135–145)
Total Bilirubin: 0.4 mg/dL (ref 0.2–1.2)
Total Protein: 6.3 g/dL (ref 6.0–8.3)

## 2024-03-27 LAB — MICROALBUMIN / CREATININE URINE RATIO
Creatinine,U: 74.2 mg/dL
Microalb Creat Ratio: 73.9 mg/g — ABNORMAL HIGH (ref 0.0–30.0)
Microalb, Ur: 5.5 mg/dL — ABNORMAL HIGH (ref 0.0–1.9)

## 2024-03-27 LAB — CBC WITH DIFFERENTIAL/PLATELET
Basophils Absolute: 0 K/uL (ref 0.0–0.1)
Basophils Relative: 0.2 % (ref 0.0–3.0)
Eosinophils Absolute: 0.2 K/uL (ref 0.0–0.7)
Eosinophils Relative: 0.7 % (ref 0.0–5.0)
HCT: 39.4 % (ref 39.0–52.0)
Hemoglobin: 12.4 g/dL — ABNORMAL LOW (ref 13.0–17.0)
Lymphocytes Relative: 87.3 % — ABNORMAL HIGH (ref 12.0–46.0)
Lymphs Abs: 22.4 K/uL — ABNORMAL HIGH (ref 0.7–4.0)
MCHC: 31.5 g/dL (ref 30.0–36.0)
MCV: 82.6 fl (ref 78.0–100.0)
Monocytes Absolute: 0.6 K/uL (ref 0.1–1.0)
Monocytes Relative: 2.5 % — ABNORMAL LOW (ref 3.0–12.0)
Neutro Abs: 2.4 K/uL (ref 1.4–7.7)
Neutrophils Relative %: 9.3 % — ABNORMAL LOW (ref 43.0–77.0)
Platelets: 158 K/uL (ref 150.0–400.0)
RBC: 4.77 Mil/uL (ref 4.22–5.81)
RDW: 19.7 % — ABNORMAL HIGH (ref 11.5–15.5)
WBC: 25.6 K/uL (ref 4.0–10.5)

## 2024-03-27 LAB — POCT GLYCOSYLATED HEMOGLOBIN (HGB A1C)
HbA1c POC (<> result, manual entry): 5.7 % (ref 4.0–5.6)
HbA1c, POC (controlled diabetic range): 5.7 % (ref 0.0–7.0)
HbA1c, POC (prediabetic range): 5.7 % (ref 5.7–6.4)
Hemoglobin A1C: 5.7 % — AB (ref 4.0–5.6)

## 2024-03-27 MED ORDER — METOPROLOL TARTRATE 25 MG PO TABS: 12.5000 mg | ORAL_TABLET | Freq: Two times a day (BID) | ORAL | 1 refills | Status: DC

## 2024-03-27 MED ORDER — LISINOPRIL 40 MG PO TABS: 40.0000 mg | ORAL_TABLET | Freq: Every day | ORAL | 1 refills | Status: DC

## 2024-03-27 MED ORDER — AMLODIPINE BESYLATE 5 MG PO TABS: 5.0000 mg | ORAL_TABLET | Freq: Every day | ORAL | 1 refills | Status: DC

## 2024-03-27 MED ORDER — FUROSEMIDE 20 MG PO TABS: 10.0000 mg | ORAL_TABLET | Freq: Every day | ORAL | 1 refills | Status: DC | PRN

## 2024-03-27 MED ORDER — DAPAGLIFLOZIN PROPANEDIOL 10 MG PO TABS: 10.0000 mg | ORAL_TABLET | Freq: Every day | ORAL | 5 refills | Status: DC

## 2024-03-27 MED ORDER — GABAPENTIN 100 MG PO CAPS: 100.0000 mg | ORAL_CAPSULE | Freq: Every day | ORAL | 5 refills | Status: DC

## 2024-03-27 MED ORDER — ATORVASTATIN CALCIUM 40 MG PO TABS: 40.0000 mg | ORAL_TABLET | Freq: Every day | ORAL | 3 refills | Status: DC

## 2024-03-27 NOTE — Progress Notes (Signed)
 Patient ID: Marcus Taylor, male  DOB: September 24, 1940, 83 y.o.   MRN: [id number] Patient Care Team    Relationship Specialty Notifications Start End  Catherine Charlies LABOR, DO PCP - General Family Medicine  01/24/22   Lonn Hicks, MD Consulting Physician Hematology and Oncology  01/26/16   Maryelizabeth Lenis, MD Referring Physician Cardiology  01/26/16   Carles Gear, MD Consulting Physician Neurosurgery  01/27/16   Elspeth Lauraine DEL, OD Referring Physician   05/03/16    Comment: opthalmology   Gershon Donnice SAUNDERS, DPM Consulting Physician Podiatry  06/14/18   Alyse Agent, MD Consulting Physician Orthopedic Surgery  10/11/23     Chief Complaint  Patient presents with   Hypertension    Chronic condition management Pt is fasting.     Subjective: Marcus Taylor is a 83 y.o. male present for Chronic Conditions/illness Management  All past medical history, surgical history, allergies, family history, immunizations, medications and social history were updated in the electronic medical record today. All recent labs, ED visits and hospitalizations within the last year were reviewed.   Hypertension/H/o MI/CAD/stentsx3/iron deficiency anemia: Pt would like to be referred to a local cardiologist.  Pt has had stent placement and MI in 2015. Last seen 2022- reports he was told he does not need to come back unless he has a problem by doctor at [facility]. He is compliant with Lipitor 40 mg, lisinopril  40 mg, lopressor  12.5 (BID),amlodipine  5 mg qd.  He takes a daily baby asa.  Lasix  10-20 mg qd prn.  He is prescribed nitro, he -has not needed it.  Plavix   stopped (> 73yr since stent)  .  Patient denies chest pain, shortness of breath, dizziness or lower extremity edema.    Diabetes type 2/microalbuminuria: Patient  has been diet controlled for almost a year.  He had been on metformin  in the past couple times a week only. Patient denies dizziness, hyperglycemic or hypoglycemic events. Patient denies numbness,  tingling in the extremities or nonhealing wounds of feet.  He was started on Farxiga  for microalbuminuria-tolerating Patient reports he is still using the gabapentin  100-300 mg nightly  iron deficiency anemia:  Iron supplementing   Lightheadedness: Patient reports today that he has had 2 occurrences of significant lightheadedness in the last week.  He gives an example of the first occurrence he was sitting down looking at his phone, then when he looked up he became extremely lightheaded and states his head felt funny.  He reports he felt off and he had scared him a good deal.  He states symptoms lasted less than 1 minute.  He had another recurrence when he was seated and he had to sign something on an iPad that was handed to him and he looked up at the iPad sign and same symptoms occurred. He reports he has not passed out, but felt like he was going to.      10/11/2023    8:06 AM 09/05/2023   10:01 AM 11/09/2022    7:58 AM 08/23/2022    8:02 AM 08/17/2021    9:33 AM  Depression screen PHQ 2/9  Decreased Interest 0 0 0 0 0  Down, Depressed, Hopeless 0 0 0 0 0  PHQ - 2 Score 0 0 0 0 0  Altered sleeping 0 0     Tired, decreased energy 0 0     Change in appetite 0 0     Feeling bad or failure about yourself  0 0  Trouble concentrating 0 0     Moving slowly or fidgety/restless 0 0     Suicidal thoughts 0 0     PHQ-9 Score 0 0     Difficult doing work/chores Not difficult at all Not difficult at all         10/11/2023    8:06 AM  GAD 7 : Generalized Anxiety Score  Nervous, Anxious, on Edge 0  Control/stop worrying 0  Worry too much - different things 0  Trouble relaxing 0  Restless 0  Easily annoyed or irritable 0  Afraid - awful might happen 0  Total GAD 7 Score 0  Anxiety Difficulty Not difficult at all          10/11/2023    8:04 AM 09/05/2023    8:16 AM 11/09/2022    7:58 AM 08/23/2022    8:02 AM 09/14/2021    9:42 AM  Fall Risk   Falls in the past year? 0 0 0 0 0   Number falls in past yr:  0 0 0 0  Injury with Fall?  0 0 0 0  Follow up Falls evaluation completed Falls evaluation completed;Education provided;Falls prevention discussed Falls evaluation completed Falls evaluation completed  Falls evaluation completed      Data saved with a previous flowsheet row definition   Immunization History  Administered Date(s) Administered   Fluad Quad(high Dose 65+) 04/28/2019, 05/10/2020, 07/04/2021, 05/08/2022   Fluad Trivalent(High Dose 65+) 04/05/2023   Influenza Split 05/07/2012   Influenza, High Dose Seasonal PF 05/03/2016, 07/02/2017, 05/20/2018   Influenza,inj,Quad PF,6+ Mos 09/22/2013, 05/25/2014, 05/31/2015   PFIZER(Purple Top)SARS-COV-2 Vaccination 10/05/2019, 10/29/2019   Pneumococcal Conjugate-13 09/28/2014   Pneumococcal Polysaccharide-23 10/05/2011   Tdap 03/24/2014   Zoster Recombinant(Shingrix) 05/30/2018, 09/17/2018    Past Medical History:  Diagnosis Date   Anemia    Arthritis    Cervical radiculopathy 12/22/2019   Colonic polyp    Concussion 03/05/2018   Coronary artery disease    stents x 3 (03-2014)   Diabetes mellitus    Diverticulosis    throughout entire colon   History of colon polyps 01/27/2016   04/2017:  Three sessile polyps were found in the ascending colon. The polyps were 3 mm in size.  These polyps were removed with a cold biopsy forceps. Resection and retrieval were  complete.  - Two sessile polyps were found in the ascending colon. The polyps were 5 to 6 mm in size.  These polyps were removed with a cold snare. Resection and retrieval were complete.  - A 3 mm polyp was found in the    History of total right knee replacement 07/09/2018   HTN (hypertension)    Hypertriglyceridemia    Ingrown toenail 10/21/2019   Kidney stone    Left hamstring injury 11/02/2020   Lymphocytosis    Metatarsal fracture left   left 5th proximal phalanx    Neck pain 02/19/2018   OSA (obstructive sleep apnea)    Mild by records    PONV (postoperative nausea and vomiting)    first time, no problem since then   RBBB 01/2016   Right inguinal hernia 02/03/2016   ? 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   Splenomegaly    STEMI (ST elevation myocardial infarction) [facility])    August 2015   Subarachnoid hematoma (HCC) 03/12/2018   Subarachnoid hemorrhage (HCC) 03/05/2018   No Known Allergies Past Surgical History:  Procedure Laterality Date  CARDIAC CATHETERIZATION     stents x 3 (03/2014)   CHOLECYSTECTOMY     INGUINAL HERNIA REPAIR Right 05/27/2015   Procedure: LAPAROSCOPIC REPAIR RIGHT  INGUINAL HERNIA;  Surgeon: Camellia Blush, MD;  Location: [facility];  Service: General;  Laterality: Right;   INSERTION OF MESH Right 05/27/2015   Procedure: INSERTION OF MESH;  Surgeon: Camellia Blush, MD;  Location: [facility];  Service: General;  Laterality: Right;   LUMBAR DISC SURGERY     TONSILLECTOMY AND ADENOIDECTOMY     TOTAL KNEE ARTHROPLASTY Right 07/09/2018   Procedure: RIGHT TOTAL KNEE ARTHROPLASTY;  Surgeon: Anderson Maude ORN, MD;  Location: [facility] OR;  Service: Orthopedics;  Laterality: Right;   Family History  Problem Relation Age of Onset   Kidney disease Sister    Colon cancer Sister 16   Colon cancer Brother    Bone cancer Brother    Arthritis Mother    Breast cancer Mother    Heart disease Mother    Early death Father    Arthritis Son    Esophageal cancer Sister    Bladder Cancer Sister    Rectal cancer Neg Hx    Stomach cancer Neg Hx    Social History   Social History Narrative   Married to [place].    HS education. Retired.    Former smoker. No drugs or ETOH.   Wears seatbelt.   Wears dentures.   Smoke detector in the home. Firearms  In the home.    Feels safe in his relationships.     Allergies as of 03/27/2024   No Known Allergies      Medication List        Accurate as of March 27, 2024 12:11 PM. If you have any questions, ask your nurse or doctor.           amLODipine  5 MG tablet Commonly known as: NORVASC  Take 1 tablet (5 mg total) by mouth daily.   aspirin  EC 81 MG tablet Take 81 mg by mouth daily.   atorvastatin  40 MG tablet Commonly known as: LIPITOR Take 1 tablet (40 mg total) by mouth daily.   dapagliflozin  propanediol 10 MG Tabs tablet Commonly known as: Farxiga  Take 1 tablet (10 mg total) by mouth daily before breakfast.   diclofenac  Sodium 1 % Gel Commonly known as: VOLTAREN  Apply 2 g topically 4 (four) times daily.   esomeprazole 20 MG capsule Commonly known as: NEXIUM Take 20 mg by mouth every morning.   ferrous sulfate  324 MG Tbec Take 324 mg by mouth daily.   fluticasone  50 MCG/ACT nasal spray Commonly known as: FLONASE  Place 1 spray into both nostrils in the morning and at bedtime.   furosemide  20 MG tablet Commonly known as: LASIX  Take 0.5-1 tablets (10-20 mg total) by mouth daily as needed.   gabapentin  100 MG capsule Commonly known as: NEURONTIN  Take 1-3 capsules (100-300 mg total) by mouth at bedtime.   lisinopril  40 MG tablet Commonly known as: ZESTRIL  Take 1 tablet (40 mg total) by mouth daily.   metoprolol  tartrate 25 MG tablet Commonly known as: LOPRESSOR  Take 0.5 tablets (12.5 mg total) by mouth 2 (two) times daily.   nitroGLYCERIN  0.4 MG SL tablet Commonly known as: NITROSTAT  Place 1 tablet (0.4 mg total) under the tongue every 5 (five) minutes as needed for chest pain.       All past medical history, surgical history, allergies, family history, immunizations andmedications were updated in the EMR today and reviewed under  the history and medication portions of their EMR.      ROS 14 pt review of systems performed and negative (unless mentioned in an HPI)  Objective: BP 120/74   Pulse 66   Temp 97.9 F (36.6 C)   Wt 176 lb (79.8 kg)   SpO2 99%   BMI 25.25 kg/m  Physical Exam Vitals and nursing note reviewed. Exam conducted with a chaperone present.   Constitutional:      General: He is not in acute distress.    Appearance: Normal appearance. He is not ill-appearing, toxic-appearing or diaphoretic.  HENT:     Head: Normocephalic and atraumatic.  Eyes:     General: Lids are normal. Gaze aligned appropriately. No scleral icterus.       Right eye: No discharge.        Left eye: No discharge.     Extraocular Movements: Extraocular movements intact.     Pupils: Pupils are equal, round, and reactive to light.  Neck:     Vascular: No carotid bruit.  Cardiovascular:     Rate and Rhythm: Normal rate and regular rhythm.     Heart sounds: No murmur heard.    No friction rub. No gallop.  Pulmonary:     Effort: Pulmonary effort is normal. No respiratory distress.     Breath sounds: Normal breath sounds. No wheezing, rhonchi or rales.  Musculoskeletal:     Cervical back: Neck supple. No tenderness.     Right lower leg: No edema.     Left lower leg: No edema.  Lymphadenopathy:     Cervical: No cervical adenopathy.  Skin:    General: Skin is warm.     Findings: No rash.  Neurological:     Mental Status: He is alert and oriented to person, place, and time. Mental status is at baseline.     Motor: Motor function is intact.     Coordination: Coordination is intact.     Gait: Gait is intact.     Comments: Neck: Reproducible symptoms with neck extension and flexion.  Lightheadedness and facial flushing present.  Psychiatric:        Mood and Affect: Mood normal.        Behavior: Behavior normal.        Thought Content: Thought content normal.        Judgment: Judgment normal.    No results found.  Assessment/plan: Marcus Taylor is a 83 y.o. male present for [facility] Iron deficiency anemia:  Follows with hematology   Controlled type 2 diabetes mellitus with complication, without long-term current use of insulin  (HCC) Diet controlled A1c: 6.2 --> 6.0>5.7>> 5.9>5.8>6.0> 5.9> 5.3> 5.4> 5.3 > off meds> 5.5> 5.7> 6.0>5.7A1c collected today   Continue Farxiga -mostly for microalbuminuria - Would not advise much more weight loss . - Opthalmology: UTD 12/10/2023-Dr. Elspeth - flu shot up-to-date 2024 - foot exam completed 10/11/2023 - urine micro collected 10/11/2023-with elevation, started Farxiga > repeat today   Essential hypertension/Coronary artery disease involving native coronary artery of native heart without angina pectoris/History of ST elevation myocardial infarction (STEMI) Stable Continue lopressor  12.5 twice daily> monitor HR, if 40s, decrease to 1/2 tab once daily or discontinue Continue lisinopril  40 mg daily Continue amlodipine  5 mg qd Continue statin  - Low sodium diet encouraged. Referred to cardiology-patient desired [facility].   Splenic Margicnal Zone B-cell lymphoma/thrombocytopenia/anemia of chronic disease/pancytopenia/lymphocytosis: - Continue to  follow up with heme/onc.  - baseline 25- 29> following with heme. No need  to call critical lab  Thrombocytopenia (HCC) CBC UTD-due next visit B12 deficiency B12 within normal range with every 4-week injection. Continue B12 injections by nurse visit 4 weeks controls   GERD/long-term PPI use -Prescribed Nexium by specialty team. -B12, magnesium  and vitamin D  levels UTD  Positional lightheadedness/Postural dizziness with presyncope Reproducible symptoms on exam today with full neck extension to flexion.  Appreciated facial flushing with symptoms. - CT ANGIO HEAD W OR WO CONTRAST; Future - CT ANGIO NECK W OR WO CONTRAST; Future   Return in about 24 weeks (around 09/11/2024).  47 minutes spent during patient encounter covering multiple chronic conditions as well as new presyncope/lightheadedness concerning for vascular stenosis versus AVM/aneurysm requiring urgent CTA of head and neck orders and labs.  Orders Placed This Encounter  Procedures   CT ANGIO HEAD W OR WO CONTRAST   CT ANGIO NECK W OR WO CONTRAST   Urine Microalbumin w/creat. ratio    CBC w/Diff   Comp Met (CMET)   Iron, TIBC and Ferritin Panel   Ambulatory referral to Cardiology   POCT HgB A1C   Meds ordered this encounter  Medications   amLODipine  (NORVASC ) 5 MG tablet    Sig: Take 1 tablet (5 mg total) by mouth daily.    Dispense:  90 tablet    Refill:  1   dapagliflozin  propanediol (FARXIGA ) 10 MG TABS tablet    Sig: Take 1 tablet (10 mg total) by mouth daily before breakfast.    Dispense:  30 tablet    Refill:  5   furosemide  (LASIX ) 20 MG tablet    Sig: Take 0.5-1 tablets (10-20 mg total) by mouth daily as needed.    Dispense:  90 tablet    Refill:  1   lisinopril  (ZESTRIL ) 40 MG tablet    Sig: Take 1 tablet (40 mg total) by mouth daily.    Dispense:  90 tablet    Refill:  1   metoprolol  tartrate (LOPRESSOR ) 25 MG tablet    Sig: Take 0.5 tablets (12.5 mg total) by mouth 2 (two) times daily.    Dispense:  90 tablet    Refill:  1   atorvastatin  (LIPITOR) 40 MG tablet    Sig: Take 1 tablet (40 mg total) by mouth daily.    Dispense:  90 tablet    Refill:  3   gabapentin  (NEURONTIN ) 100 MG capsule    Sig: Take 1-3 capsules (100-300 mg total) by mouth at bedtime.    Dispense:  90 capsule    Refill:  5   Referral Orders         Ambulatory referral to Cardiology      Note is dictated utilizing voice recognition software. Although note has been proof read prior to signing, occasional typographical errors still can be missed. If any questions arise, please do not hesitate to call for verification.  Electronically signed by: Charlies Bellini, DO [facility] [place]

## 2024-03-27 NOTE — Telephone Encounter (Signed)
 CRITICAL VALUE STICKER  CRITICAL VALUE: WBC 25.6  RECEIVER (on-site recipient of call): Shanda  DATE & TIME NOTIFIED: 1600  MESSENGER (representative from lab): Darice   MD NOTIFIED: PCP  TIME OF NOTIFICATION:

## 2024-03-27 NOTE — Patient Instructions (Addendum)

## 2024-03-28 ENCOUNTER — Ambulatory Visit
Admission: RE | Admit: 2024-03-28 | Discharge: 2024-03-28 | Disposition: A | Discharge status: Home / Self Care | Source: Ambulatory Visit | Attending: Family Medicine | Admitting: Family Medicine

## 2024-03-28 ENCOUNTER — Other Ambulatory Visit

## 2024-03-28 ENCOUNTER — Inpatient Hospital Stay: Admission: RE | Admit: 2024-03-28 | Source: Ambulatory Visit

## 2024-03-28 DIAGNOSIS — I2510 Atherosclerotic heart disease of native coronary artery without angina pectoris: Secondary | ICD-10-CM

## 2024-03-28 DIAGNOSIS — R42 Dizziness and giddiness: Secondary | ICD-10-CM

## 2024-03-28 DIAGNOSIS — I6523 Occlusion and stenosis of bilateral carotid arteries: Secondary | ICD-10-CM | POA: Diagnosis not present

## 2024-03-28 DIAGNOSIS — I6603 Occlusion and stenosis of bilateral middle cerebral arteries: Secondary | ICD-10-CM | POA: Diagnosis not present

## 2024-03-28 LAB — IRON,TIBC AND FERRITIN PANEL
%SAT: 29 % (ref 20–48)
Ferritin: 25 ng/mL (ref 24–380)
Iron: 84 ug/dL (ref 50–180)
TIBC: 290 ug/dL (ref 250–425)

## 2024-03-28 MED ORDER — IOPAMIDOL (ISOVUE-370) INJECTION 76%
75.0000 mL | Freq: Once | INTRAVENOUS | Status: AC | PRN
  Administered 2024-03-28: 75 mL via INTRAVENOUS

## 2024-03-31 ENCOUNTER — Telehealth: Payer: Self-pay | Admitting: Family Medicine

## 2024-03-31 DIAGNOSIS — I6522 Occlusion and stenosis of left carotid artery: Secondary | ICD-10-CM | POA: Insufficient documentation

## 2024-03-31 DIAGNOSIS — R42 Dizziness and giddiness: Secondary | ICD-10-CM | POA: Insufficient documentation

## 2024-03-31 DIAGNOSIS — I6501 Occlusion and stenosis of right vertebral artery: Secondary | ICD-10-CM | POA: Insufficient documentation

## 2024-03-31 DIAGNOSIS — I6521 Occlusion and stenosis of right carotid artery: Secondary | ICD-10-CM | POA: Insufficient documentation

## 2024-03-31 NOTE — Telephone Encounter (Signed)
 CTA head and neck results completed and discussed with patient. Neurology referral placed   Positional lightheadedness - Plan: Ambulatory referral to Neurology  Postural dizziness with presyncope - Plan: Ambulatory referral to Neurology  Atherosclerosis of right carotid artery ~45%, stenosis - Plan: Ambulatory referral to Neurology  Internal carotid artery stenosis, left-mild to moderate - Plan: Ambulatory referral to Neurology  Internal carotid artery stenosis, right-mild - Plan: Ambulatory referral to Neurology  Vertebral artery stenosis, right-right - Plan: Ambulatory referral to Neurology

## 2024-04-03 ENCOUNTER — Encounter: Payer: Self-pay | Admitting: Diagnostic Neuroimaging

## 2024-04-03 ENCOUNTER — Ambulatory Visit: Admitting: Diagnostic Neuroimaging

## 2024-04-03 VITALS — BP 115/51 | HR 63 | Ht 67.0 in | Wt 171.0 lb

## 2024-04-03 DIAGNOSIS — I672 Cerebral atherosclerosis: Secondary | ICD-10-CM | POA: Diagnosis not present

## 2024-04-03 DIAGNOSIS — R42 Dizziness and giddiness: Secondary | ICD-10-CM | POA: Diagnosis not present

## 2024-04-03 NOTE — Progress Notes (Signed)
 [facility]  PATIENT: Marcus Taylor DOB: [birth year]  REFERRING CLINICIAN: Catherine, Renee A, DO HISTORY FROM: patient and Taylor  REASON FOR VISIT: new consult   HISTORICAL  CHIEF COMPLAINT:  Chief Complaint  Patient presents with   New Patient (Initial Visit)    Marcus Taylor,Marcus Taylor, he has been having some dizzy spells that intermittently comes and goes. States that most of the time comes on suddenly when he is standing up. Doesn't feel that it worsens with position changes. 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 he doesn't feel like he is going to fall.     HISTORY OF PRESENT ILLNESS:   83 year old male here for evaluation of lightheadedness and dizziness.  Symptoms started 2 to 3 weeks ago with 30  second episodes of dizzy, lightheaded feeling.  Has had about 3-4 events since that time.  No spinning or vertigo sensation.  He describes this more as a lightheadedness and swimmy headedness.  Sometimes this happens when he is sitting down or standing up.  Has occurred when he is looking at screens.    REVIEW OF SYSTEMS: Full 14 system review of systems performed and negative with exception of: as per HPI.  ALLERGIES: No Known Allergies  HOME MEDICATIONS: Outpatient Medications Prior to Visit  Medication Sig Dispense Refill   amLODipine  (NORVASC ) 5 MG tablet Take 1 tablet (5 mg total) by mouth daily. 90 tablet 1   aspirin  EC 81 MG tablet Take 81 mg by mouth daily.     atorvastatin  (LIPITOR) 40 MG tablet Take 1 tablet (40 mg total) by mouth daily. 90 tablet 3   dapagliflozin  propanediol (FARXIGA ) 10 MG TABS tablet Take 1 tablet (10 mg total) by mouth daily before breakfast. 30 tablet 5   diclofenac  Sodium (VOLTAREN ) 1 % GEL Apply 2 g topically 4 (four) times daily. 100 g 1   esomeprazole (NEXIUM) 20 MG capsule Take 20 mg by mouth every morning.     ferrous sulfate  324 MG TBEC Take 324 mg  by mouth daily.     fluticasone  (FLONASE ) 50 MCG/ACT nasal spray Place 1 spray into both nostrils in the morning and at bedtime. 16 g Taylor   furosemide  (LASIX ) 20 MG tablet Take 0.5-1 tablets (10-20 mg total) by mouth daily as needed. 90 tablet 1   gabapentin  (NEURONTIN ) 100 MG capsule Take 1-3 capsules (100-300 mg total) by mouth at bedtime. 90 capsule 5   lisinopril  (ZESTRIL ) 40 MG tablet Take 1 tablet (40 mg total) by mouth daily. 90 tablet 1   metoprolol  tartrate (LOPRESSOR ) 25 MG tablet Take 0.5 tablets (12.5 mg total) by mouth 2 (two) times daily. 90 tablet 1   nitroGLYCERIN  (NITROSTAT ) 0.4 MG SL tablet Place 1 tablet (0.4 mg total) under the tongue every 5 (five) minutes as needed for chest pain. 10 tablet 0   Facility-Administered Medications Prior to Visit  Medication Dose Route Frequency Provider Last Rate Last Admin   cyanocobalamin  (VITAMIN B12) injection 1,000 mcg  1,000 mcg Intramuscular Q30 days Kuneff, Renee A, DO   1,000 mcg at 03/17/24 0901    PAST MEDICAL HISTORY: Past Medical History:  Diagnosis Date   Anemia    Arthritis    Cervical radiculopathy 12/22/2019   Colonic polyp    Concussion 03/05/2018   Coronary artery disease    stents x 3 (03-2014)   Diabetes mellitus  Diverticulosis    throughout entire colon   History of colon polyps 01/27/2016   04/2017:  Three sessile polyps were found in the ascending colon. The polyps were 3 mm in size.  These polyps were removed with a cold biopsy forceps. Resection and retrieval were  complete.  - Two sessile polyps were found in the ascending colon. The polyps were 5 to Taylor mm in size.  These polyps were removed with a cold snare. Resection and retrieval were complete.  - A 3 mm polyp was found in the    History of total right knee replacement 07/09/2018   HTN (hypertension)    Hypertriglyceridemia    Ingrown toenail 10/21/2019   Kidney stone    Left hamstring injury 11/02/2020   Lymphocytosis    Metatarsal fracture left    left 5th proximal phalanx    Neck pain 02/19/2018   OSA (obstructive sleep apnea)    Mild by records   PONV (postoperative nausea and vomiting)    first time, no problem since then   RBBB 01/2016   Right inguinal hernia 02/03/2016   ? 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   Splenomegaly    STEMI (ST elevation myocardial infarction) [facility])    August 2015   Subarachnoid hematoma (HCC) 03/12/2018   Subarachnoid hemorrhage (HCC) 03/05/2018    PAST SURGICAL HISTORY: Past Surgical History:  Procedure Laterality Date   CARDIAC CATHETERIZATION     stents x 3 (03/2014)   CHOLECYSTECTOMY     INGUINAL HERNIA REPAIR Right 05/27/2015   Procedure: LAPAROSCOPIC REPAIR RIGHT  INGUINAL HERNIA;  Surgeon: Camellia Blush, MD;  Location: [facility];  Service: General;  Laterality: Right;   INSERTION OF MESH Right 05/27/2015   Procedure: INSERTION OF MESH;  Surgeon: Camellia Blush, MD;  Location: [facility];  Service: General;  Laterality: Right;   LUMBAR DISC SURGERY     TONSILLECTOMY AND ADENOIDECTOMY     TOTAL KNEE ARTHROPLASTY Right 07/09/2018   Procedure: RIGHT TOTAL KNEE ARTHROPLASTY;  Surgeon: Anderson Maude ORN, MD;  Location: [facility] OR;  Service: Orthopedics;  Laterality: Right;    FAMILY HISTORY: Family History  Problem Relation Age of Onset   Kidney disease Sister    Colon cancer Sister 90   Colon cancer Brother    Bone cancer Brother    Arthritis Mother    Breast cancer Mother    Heart disease Mother    Early death Father    Arthritis Son    Esophageal cancer Sister    Bladder Cancer Sister    Rectal cancer Neg Hx    Stomach cancer Neg Hx     SOCIAL HISTORY: Social History   Socioeconomic History   Marital status: Married    Spouse name: Not on file   Number of children: 2   Years of education: Not on file   Highest education level: Not on file  Occupational History   Occupation: retired    Comment: used to work as an Chief Technology Officer;  now retired  Tobacco Use   Smoking status: Former    Current packs/day: 0.00    Average packs/day: 1 pack/day for 20.0 years (20.0 ttl pk-yrs)    Types: Cigarettes    Start date: 08/14/1968    Quit date: 08/14/1988    Years since quitting: 35.Taylor   Smokeless tobacco: Never  Vaping Use   Vaping status: Never Used  Substance and Sexual Activity   Alcohol  use: No   Drug use: No   Sexual activity: Yes    Birth control/protection: None  Other Topics Concern   Not on file  Social History Narrative   Married to [place].    HS education. Retired.    Former smoker. No drugs or ETOH.   Wears seatbelt.   Wears dentures.   Smoke detector in the home. Firearms  In the home.    Feels safe in his relationships.    Social Drivers of Corporate investment banker Strain: Low Risk  (09/05/2023)   Overall Financial Resource Strain (CARDIA)    Difficulty of Paying Living Expenses: Not hard at all  Food Insecurity: No Food Insecurity (09/05/2023)   Hunger Vital Sign    Worried About Running Out of Food in the Last Year: Never true    Ran Out of Food in the Last Year: Never true  Transportation Needs: No Transportation Needs (09/05/2023)   PRAPARE - Administrator, Civil Service (Medical): No    Lack of Transportation (Non-Medical): No  Physical Activity: Insufficiently Active (08/23/2022)   Exercise Vital Sign    Days of Exercise per Week: 5 days    Minutes of Exercise per Session: 20 min  Stress: No Stress Concern Present (09/05/2023)   [employer] of Occupational Health - Occupational Stress Questionnaire    Feeling of Stress : Not at all  Social Connections: Moderately Isolated (09/05/2023)   Social Connection and Isolation Panel    Frequency of Communication with Friends and Family: Three times a week    Frequency of Social Gatherings with Friends and Family: Twice a week    Attends Religious Services: Never    Database administrator or Organizations: No    Attends Tax inspector Meetings: Never    Marital Status: Married  Catering manager Violence: Not At Risk (09/05/2023)   Humiliation, Afraid, Rape, and Kick questionnaire    Fear of Current or Ex-Partner: No    Emotionally Abused: No    Physically Abused: No    Sexually Abused: No     PHYSICAL EXAM  GENERAL EXAM/CONSTITUTIONAL: Vitals:  Vitals:   04/03/24 1037  BP: (!) 115/51  Pulse: 63  Weight: 171 lb (77.Taylor kg)  Height: 5' 7 (1.702 m)   Body mass index is 26.78 kg/m. Wt Readings from Last 3 Encounters:  04/03/24 171 lb (77.Taylor kg)  03/27/24 176 lb (79.8 kg)  12/13/23 171 lb 3.2 oz (77.7 kg)   Patient is in no distress; well developed, nourished and groomed; neck is supple  CARDIOVASCULAR: Examination of carotid arteries is normal; no carotid bruits Regular rate and rhythm, no murmurs Examination of peripheral vascular system by observation and palpation is normal  EYES: Ophthalmoscopic exam of optic discs and posterior segments is normal; no papilledema or hemorrhages No results found.  MUSCULOSKELETAL: Gait, strength, tone, movements noted in Neurologic exam below  NEUROLOGIC: MENTAL STATUS:     06/14/2018    8:45 AM  MMSE - Mini Mental State Exam  Orientation to time 5  Orientation to Place 5  Registration 3  Attention/ Calculation 5  Recall 0  Language- name 2 objects 2  Language- repeat 1  Language- follow 3 step command 3  Language- read & follow direction 1  Write a sentence 1  Copy design 1  Total score 27   awake, alert, oriented to person, place and time recent and remote memory intact normal attention and concentration language fluent, comprehension  intact, naming intact fund of knowledge appropriate  CRANIAL NERVE:  2nd - no papilledema on fundoscopic exam 2nd, 3rd, 4th, 6th - pupils 2mm and minimal reaction; visual fields full to confrontation, extraocular muscles intact, no nystagmus 5th - facial sensation symmetric 7th - facial strength  symmetric 8th - hearing intact 9th - palate elevates symmetrically, uvula midline 11th - shoulder shrug symmetric 12th - tongue protrusion midline  MOTOR:  normal bulk and tone, full strength in the BUE, BLE; BUE 4+; BLE 5, EXCEPT RIGHT FOOT DROP 2/5  SENSORY:  normal and symmetric to light touch, pinprick, temperature, vibration  COORDINATION:  finger-nose-finger, fine finger movements normal  REFLEXES:  deep tendon reflexes TRACE and symmetric  GAIT/STATION:  narrow based gait; SLOW AND CAUTIOUS     DIAGNOSTIC DATA (LABS, IMAGING, TESTING) - I reviewed patient records, labs, notes, testing and imaging myself where available.  Lab Results  Component Value Date   WBC 25.Taylor Repeated and verified X2. (HH) 03/27/2024   HGB 12.4 (L) 03/27/2024   HCT 39.4 03/27/2024   MCV 82.Taylor 03/27/2024   PLT 158.0 03/27/2024      Component Value Date/Time   NA 143 03/27/2024 0821   NA 144 05/31/2015 1404   K 4.2 03/27/2024 0821   K 4.7 05/31/2015 1404   CL 104 03/27/2024 0821   CL 102 10/07/2012 1013   CO2 32 03/27/2024 0821   CO2 29 05/31/2015 1404   GLUCOSE 102 (H) 03/27/2024 0821   GLUCOSE 99 05/31/2015 1404   GLUCOSE 199 (H) 10/07/2012 1013   BUN 23 03/27/2024 0821   BUN 18.3 05/31/2015 1404   CREATININE 0.82 03/27/2024 0821   CREATININE 0.86 12/13/2023 0841   CREATININE 0.86 03/13/2017 0759   CREATININE 0.9 05/31/2015 1404   CALCIUM  9.3 03/27/2024 0821   CALCIUM  9.2 05/31/2015 1404   PROT Taylor.3 03/27/2024 0821   PROT Taylor.3 (L) 05/31/2015 1404   ALBUMIN 4.4 03/27/2024 0821   ALBUMIN 3.9 05/31/2015 1404   AST 32 03/27/2024 0821   AST 28 12/13/2023 0841   AST 28 05/31/2015 1404   ALT 15 03/27/2024 0821   ALT 12 12/13/2023 0841   ALT 23 05/31/2015 1404   ALKPHOS 60 03/27/2024 0821   ALKPHOS 79 05/31/2015 1404   BILITOT 0.4 03/27/2024 0821   BILITOT 0.4 12/13/2023 0841   BILITOT 0.46 05/31/2015 1404   GFRNONAA >60 12/13/2023 0841   GFRNONAA 85 03/13/2017 0759   GFRAA  >60 05/03/2020 0857   GFRAA >89 03/13/2017 0759   Lab Results  Component Value Date   CHOL 98 04/26/2023   HDL 36.00 (L) 04/26/2023   LDLCALC 46 04/26/2023   TRIG 79.0 04/26/2023   CHOLHDL 3 04/26/2023   Lab Results  Component Value Date   HGBA1C 5.7 (A) 03/27/2024   HGBA1C 5.7 03/27/2024   HGBA1C 5.7 03/27/2024   HGBA1C 5.7 03/27/2024   Lab Results  Component Value Date   VITAMINB12 668 10/11/2023   Lab Results  Component Value Date   TSH 1.72 10/11/2023   03/06/18 TTE - Left ventricle: The cavity size was normal. Wall thickness was    increased in a pattern of mild LVH. There was mild concentric    hypertrophy. Systolic function was normal. The estimated ejection    fraction was in the range of 60% to 65%. Wall motion was normal;    there were no regional wall motion abnormalities. The study is    not technically sufficient to allow evaluation of LV diastolic  function.  - Aortic valve: There was mild regurgitation.  - Left atrium: The atrium was mildly dilated.  - Atrial septum: No defect or patent foramen ovale was identified.  - Impressions: Patient appeared to have 3:2 wenkebach during exam.    03/28/24 CTA head / neck 1. No large vessel occlusion, severe stenosis, aneurysm, or dissection of the arteries in the head and neck. 2. No significant irregularity of the cerebral arteries to suggest vasospasm on the current study. 3. Multifocal atherosclerosis as above. 4. Mild calcified atherosclerosis at the right carotid bifurcation resulting in approximately 45% stenosis. 5. Atherosclerosis of the carotid siphons with mild stenosis on the right and mild-to-moderate stenosis on the left involving the distal cavernous and paraclinoid segments. Taylor. Variant branching pattern of the aortic arch with aberrant right subclavian artery. Mild aortic arch atherosclerosis.    ASSESSMENT AND PLAN  82 y.o. year old male here with:   Dx:  1. Positional lightheadedness    2. Cerebral atherosclerosis     PLAN:  Intermittent lightheaded / dizziness (could be related tolow BP, low sugar) + mild intracranial atherosclerosis (asymptomatic) - continue medical mgmt (aspirin  81, statin, diabetes control, BP control) - monitor BP, sugar at home - fall precautions reviewed; caution with driving - follow up with PCP and cardiology  Return for pending if symptoms worsen or fail to improve, return to PCP.    EDUARD FABIENE HANLON, MD 04/03/2024, 11:16 AM Certified in Neurology, Neurophysiology and Neuroimaging  [facility] [address], [address] [place], [place] [address] [phone]

## 2024-04-03 NOTE — Patient Instructions (Addendum)
 Intermittent lightheaded / dizziness + mild intracranial atherosclerosis  - continue medical mgmt (aspirin  81, statin, diabetes control, BP control) - monitor BP, sugar at home - fall precautions reviewed; caution with driving

## 2024-04-15 ENCOUNTER — Ambulatory Visit (INDEPENDENT_AMBULATORY_CARE_PROVIDER_SITE_OTHER)

## 2024-04-15 DIAGNOSIS — E538 Deficiency of other specified B group vitamins: Secondary | ICD-10-CM

## 2024-04-15 MED ORDER — CYANOCOBALAMIN 1000 MCG/ML IJ SOLN
1000.0000 ug | Freq: Once | INTRAMUSCULAR | Status: AC
  Administered 2024-04-15: 1000 ug via INTRAMUSCULAR

## 2024-04-15 NOTE — Progress Notes (Signed)
 Pt here for monthly B12 injection per Dr Catherine   Last B12 injection: 03/17/24   B12 1000mcg given IM, and pt tolerated injection well.   Next B12 injection scheduled for: 1 month

## 2024-04-16 ENCOUNTER — Ambulatory Visit

## 2024-05-13 ENCOUNTER — Ambulatory Visit (INDEPENDENT_AMBULATORY_CARE_PROVIDER_SITE_OTHER)

## 2024-05-13 DIAGNOSIS — E538 Deficiency of other specified B group vitamins: Secondary | ICD-10-CM

## 2024-05-13 NOTE — Progress Notes (Signed)
 Pt here for monthly B12 injection per Dr. Catherine  Last B12 injection: 9/2  B12 1000mcg given IM, and pt tolerated injection well.  Next B12 injection scheduled for: 1 month

## 2024-05-16 ENCOUNTER — Ambulatory Visit: Admitting: Podiatry

## 2024-05-16 ENCOUNTER — Encounter: Payer: Self-pay | Admitting: Podiatry

## 2024-05-16 DIAGNOSIS — B351 Tinea unguium: Secondary | ICD-10-CM | POA: Diagnosis not present

## 2024-05-16 DIAGNOSIS — M79676 Pain in unspecified toe(s): Secondary | ICD-10-CM

## 2024-05-16 DIAGNOSIS — M66871 Spontaneous rupture of other tendons, right ankle and foot: Secondary | ICD-10-CM | POA: Diagnosis not present

## 2024-05-16 NOTE — Progress Notes (Signed)
 Subjective:  Chief Complaint  Patient presents with   Diabetes    DFC NIDDM A1C 5.7. Toenail trim. Pt. Has neuropathy. Left great toenail discolored black.    83 y.o. returns the office today for painful, elongated, thickened toenails which he cannot trim himself.    States in regards to the right ankle, tendon he does not having any pain with this but states that his foot will flop at times when trying to walk. No swelling or other new injuries. He states it is more annoying than anything.   PCP: Catherine Fuller A, DO Last seen 10/11/2023 Last A1c: 6.0 on 11/08/2023  Objective: AAO 3, NAD DP/PT pulses palpable, CRT less than 3 seconds  Nails hypertrophic, dystrophic, elongated, brittle, discolored 10. There is tenderness overlying the nails 1-5 bilaterally. There is no surrounding erythema or drainage along the nail sites. There is to be a rupture of the extensor tendon on the right foot on the EHL. Decreased dorsiflexion in the right ankle. Unable to appreciate any tenderness on exam.  There is no edema. No pain with calf compression, swelling, warmth, erythema. Contracture digits is noted.  No pinpoint area of discomfort dorsally on either foot.   Assessment: Patient presents with symptomatic onychomycosis; neuropathic foot pain; consider tendon rupture  Plan: Symptomatic otomycosis -Nails sharply debrided 10 without complication/bleeding.  Tendon rupture -Regards to his extensor tendon rupture we discussed both conservative as well as surgical options again today.  Again states he would like to avoid surgery. Will discuss with Lolita, pedorthotist, regarding possible bracing.   Return in about 3 months (around 08/16/2024).  Donnice JONELLE Fees DPM

## 2024-05-23 ENCOUNTER — Telehealth: Payer: Self-pay | Admitting: Podiatry

## 2024-05-23 NOTE — Telephone Encounter (Signed)
 This patient has a dropfoot on the right. I spoke with Lolita yesterday and she has a brace for the right side in the office. Can we schedule him an appointment to try the brace? Thanks!

## 2024-06-05 ENCOUNTER — Ambulatory Visit (INDEPENDENT_AMBULATORY_CARE_PROVIDER_SITE_OTHER)

## 2024-06-05 DIAGNOSIS — M21371 Foot drop, right foot: Secondary | ICD-10-CM

## 2024-06-05 NOTE — Progress Notes (Signed)
 Patient has no deductible and 20% coins  [phone] Notification or Prior Authorization is not required for the requested services You are not required to submit a notification/prior authorization based on the information provided. If you have general questions about the prior authorization requirements, visit [email or web address] > Clinician Resources > Advance and Admission Notification Requirements. The number above acknowledges your notification. Please write this reference number down for future reference. If you would like to request an organization determination, please call us  at [phone]. Decision ID #: [id number] The number above acknowledges your inquiry and our response. Please write this number down and refer to it for future inquiries. Coverage and payment for an item or service is governed by the member's benefit plan document, and, if applicable, the provider's participation agreement with the Health Plan.  Brace fit to patient very good fit reduction in foot drop is significant  Patient is very happy with fit an function as well

## 2024-06-10 ENCOUNTER — Ambulatory Visit (INDEPENDENT_AMBULATORY_CARE_PROVIDER_SITE_OTHER)

## 2024-06-10 DIAGNOSIS — E538 Deficiency of other specified B group vitamins: Secondary | ICD-10-CM

## 2024-06-10 NOTE — Progress Notes (Signed)
 Pt here for monthly B12 injection per Dr Catherine  Last B12 injection: 9/30    B12 1000mcg given IM, and pt tolerated injection well.  Next B12 injection scheduled for: 1 month

## 2024-07-14 ENCOUNTER — Ambulatory Visit

## 2024-07-14 DIAGNOSIS — E538 Deficiency of other specified B group vitamins: Secondary | ICD-10-CM | POA: Diagnosis not present

## 2024-07-14 NOTE — Progress Notes (Signed)
 Pt here for monthly B12 injection per Dr Catherine   Last B12 injection: 10/28    B12 1000mcg given IM, and pt tolerated injection well.   Next B12 injection scheduled for: 1 month

## 2024-07-25 ENCOUNTER — Ambulatory Visit (INDEPENDENT_AMBULATORY_CARE_PROVIDER_SITE_OTHER): Admitting: Cardiology

## 2024-07-25 ENCOUNTER — Encounter (HOSPITAL_BASED_OUTPATIENT_CLINIC_OR_DEPARTMENT_OTHER): Payer: Self-pay | Admitting: Cardiology

## 2024-07-25 VITALS — BP 102/58 | HR 50 | Ht 68.0 in | Wt 174.9 lb

## 2024-07-25 DIAGNOSIS — I2510 Atherosclerotic heart disease of native coronary artery without angina pectoris: Secondary | ICD-10-CM

## 2024-07-25 DIAGNOSIS — E1169 Type 2 diabetes mellitus with other specified complication: Secondary | ICD-10-CM

## 2024-07-25 DIAGNOSIS — I10 Essential (primary) hypertension: Secondary | ICD-10-CM | POA: Diagnosis not present

## 2024-07-25 DIAGNOSIS — I252 Old myocardial infarction: Secondary | ICD-10-CM | POA: Diagnosis not present

## 2024-07-25 DIAGNOSIS — E785 Hyperlipidemia, unspecified: Secondary | ICD-10-CM

## 2024-07-25 NOTE — Progress Notes (Signed)
 Cardiology Office Note:  .   Date:  07/25/2024  ID:  Marcus Taylor, DOB 13-Apr-1941, MRN [id number] PCP: Catherine Charlies LABOR, DO  [facility] Providers Cardiologist:  None     History of Present Illness: Marcus Taylor is a 83 y.o. male Discussed the use of AI scribe   History of Present Illness Marcus Taylor is an 83 year old male with coronary artery disease who presents for a cardiovascular follow-up.  He has a history of coronary artery disease with three stent placements following an ST elevation myocardial infarction in August 2015. Since then, he has experienced no chest pain and remains stable on his current medication regimen, which includes atorvastatin  40 mg daily, aspirin  81 mg daily, and metoprolol  12.5 mg twice a day. He also takes furosemide  20 mg as needed, usually half a tablet. He mentions being informed of additional blockages but has not experienced any symptoms related to these.  No episodes of fainting or syncope.  His past medical history includes a subarachnoid hemorrhage in 2019. An echocardiogram from 2019 showed normal pump function with an ejection fraction of 60-65% and mild aortic valve regurgitation.  He is followed by neurology for intermittent lightheadedness. Recent lab results include a hemoglobin of 12.4, LDL of 46, and A1c of 5.7. His creatinine is 0.82.  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 He quit smoking 40 years ago.     Studies Reviewed: SABRA   EKG Interpretation Date/Time:  Friday July 25 2024 13:38:32 EST Ventricular Rate:  50 PR Interval:  240 QRS Duration:  170 QT Interval:  490 QTC Calculation: 446 R Axis:   62  Text Interpretation: Sinus bradycardia with 1st degree A-V block with Premature atrial complexes Right bundle branch block When  compared with ECG of 05-Mar-2018 12:22, No significant change since last tracing Confirmed by Jeffrie Anes ([id number]) on 07/25/2024 1:42:30 PM    Results LABS Hemoglobin: 12.4 LDL: 46 A1c: 5.7 Creatinine: 0.82  DIAGNOSTIC Echocardiogram: Normal ejection fraction 60-65% with mild aortic valve regurgitation (2019) Risk Assessment/Calculations:            Physical Exam:   VS:  BP (!) 102/58 (BP Location: Left Arm, Patient Position: Sitting, Cuff Size: Normal)   Pulse (!) 50   Ht 5' 8 (1.727 m)   Wt 174 lb 14.4 oz (79.3 kg)   SpO2 99%   BMI 26.59 kg/m    Wt Readings from Last 3 Encounters:  07/25/24 174 lb 14.4 oz (79.3 kg)  04/03/24 171 lb (77.6 kg)  03/27/24 176 lb (79.8 kg)    GEN: Well nourished, well developed in no acute distress NECK: No JVD; No carotid bruits CARDIAC: RRR, no murmurs, no rubs, no gallops RESPIRATORY:  Clear to auscultation without rales, wheezing or rhonchi  ABDOMEN: Soft, non-tender, non-distended EXTREMITIES:  No edema; No deformity   ASSESSMENT AND PLAN: .    Assessment and Plan Assessment & Plan Coronary artery disease with prior ST elevation myocardial infarction and stent placement Coronary artery disease with prior ST elevation myocardial infarction in August 2015, treated with three stents. Currently asymptomatic with no chest pain. Echocardiogram shows good pump function with mild aortic valve regurgitation. LDL is well-controlled at 46. Continues on aspirin  and atorvastatin  for plaque stabilization and blood pressure management. -  Continue aspirin  81 mg daily - Continue atorvastatin  40 mg daily - Continue blood pressure management  Chronic right bundle branch block with Wenckebach phenomenon Noted on EKG. No symptoms of syncope or fainting. Decision made to discontinue metoprolol  due to potential risk of bradycardia and worsening of conduction issues, which could lead to the need for a pacemaker. - Discontinued metoprolol  12.5 mg twice  daily  Type 2 diabetes with hypertension - On Farxiga  10 mg a day.  Excellent with cardiac issues.  On lisinopril  40 mg a day for renal protection.  Creatinine 0.82.  Hemoglobin A1c 5.7.       Dispo: 1 yr  Signed, Oneil Parchment, MD

## 2024-07-25 NOTE — Patient Instructions (Signed)
 Medication Instructions:   DISCONTINUE Metoprolol  ( lopressor ).  *If you need a refill on your cardiac medications before your next appointment, please call your pharmacy*  Lab Work:  None ordered.  If you have labs (blood work) drawn today and your tests are completely normal, you will receive your results only by: MyChart Message (if you have MyChart) OR A paper copy in the mail If you have any lab test that is abnormal or we need to change your treatment, we will call you to review the results.  Testing/Procedures:  None ordered.  Follow-Up: At [facility], you and your health needs are our priority.  As part of our continuing mission to provide you with exceptional heart care, our providers are all part of one team.  This team includes your primary Cardiologist (physician) and Advanced Practice Providers or APPs (Physician Assistants and Nurse Practitioners) who all work together to provide you with the care you need, when you need it.  Your next appointment:   1 year(s)  Provider:   Oneil Parchment, MD    We recommend signing up for the patient portal called MyChart.  Sign up information is provided on this After Visit Summary.  MyChart is used to connect with patients for Virtual Visits (Telemedicine).  Patients are able to view lab/test results, encounter notes, upcoming appointments, etc.  Non-urgent messages can be sent to your provider as well.   To learn more about what you can do with MyChart, go to [email or web address].   Other Instructions  Your physician wants you to follow-up in: 1 year.  You will receive a reminder letter in the mail two months in advance. If you don't receive a letter, please call our office to schedule the follow-up appointment.

## 2024-08-11 ENCOUNTER — Ambulatory Visit

## 2024-08-11 DIAGNOSIS — E538 Deficiency of other specified B group vitamins: Secondary | ICD-10-CM

## 2024-08-11 NOTE — Progress Notes (Signed)
 Pt here for monthly B12 injection per Dr Catherine   Last B12 injection: 12/1    B12 1000mcg given IM, and pt tolerated injection well.   Next B12 injection scheduled for: 1 month

## 2024-08-13 ENCOUNTER — Telehealth: Payer: Self-pay

## 2024-08-13 NOTE — Telephone Encounter (Signed)
Completed and returned to CMA work basket ?

## 2024-08-13 NOTE — Telephone Encounter (Signed)
 [employer] Provider Attestation Form for chronic special needs plan  For provider review and signature required  [facility] inbox front office

## 2024-08-18 ENCOUNTER — Ambulatory Visit: Admitting: Podiatry

## 2024-08-18 ENCOUNTER — Encounter: Payer: Self-pay | Admitting: Podiatry

## 2024-08-18 DIAGNOSIS — M21371 Foot drop, right foot: Secondary | ICD-10-CM | POA: Diagnosis not present

## 2024-08-18 DIAGNOSIS — M79676 Pain in unspecified toe(s): Secondary | ICD-10-CM | POA: Diagnosis not present

## 2024-08-18 DIAGNOSIS — B351 Tinea unguium: Secondary | ICD-10-CM

## 2024-08-18 DIAGNOSIS — D689 Coagulation defect, unspecified: Secondary | ICD-10-CM

## 2024-08-18 LAB — HM DIABETES FOOT EXAM

## 2024-08-18 NOTE — Progress Notes (Signed)
 Subjective:  Chief Complaint  Patient presents with   [facility]    Rm11 Diabetic foot care/ A1c 5.7    84 y.o. returns the office today for painful, elongated, thickened toenails which he cannot trim himself.    He states the brace has been helping quite a bit with his stability and function of the right foot given the dropfoot.  His only concern is that seems to rub on the first MPJ causing him discomfort.  Times.  No recent injuries or changes otherwise.  PCP: Catherine Fuller A, DO Last A1c: 5.7 on March 27, 2024  Objective: AAO 3, NAD DP/PT pulses palpable, CRT less than 3 seconds  Nails hypertrophic, dystrophic, elongated, brittle, discolored 10. There is tenderness overlying the nails 1-5 bilaterally. There is no surrounding erythema or drainage along the nail sites. There is to be a rupture of the extensor tendon on the right foot on the EHL. Decreased dorsiflexion in the right ankle. Unable to appreciate any tenderness on exam.  Brace has been helping his function quite a bit.  There is arthritic changes present of the first MPJ with decreased range of motion. Contracture digits is noted.  No pinpoint area of discomfort dorsally on either foot.   Assessment: Patient presents with symptomatic onychomycosis; neuropathic foot pain; dropfoot  Plan: Symptomatic otomycosis -Nails sharply debrided 10 without complication/bleeding.  Tendon rupture - He has been doing well with a dropfoot brace.  Less tender causing discomfort on the first MPJ.  Will discuss with Sueanne to see if there can be any modification to help with the discomfort.   Return in about 3 months (around 11/16/2024).  Donnice JONELLE Fees DPM

## 2024-08-19 ENCOUNTER — Other Ambulatory Visit: Payer: Self-pay

## 2024-08-19 ENCOUNTER — Ambulatory Visit: Admitting: Physician Assistant

## 2024-08-19 DIAGNOSIS — G8929 Other chronic pain: Secondary | ICD-10-CM

## 2024-08-19 DIAGNOSIS — M1712 Unilateral primary osteoarthritis, left knee: Secondary | ICD-10-CM

## 2024-08-19 DIAGNOSIS — M25562 Pain in left knee: Secondary | ICD-10-CM

## 2024-08-19 MED ORDER — LIDOCAINE HCL 1 % IJ SOLN
2.0000 mL | INTRAMUSCULAR | Status: AC | PRN
  Administered 2024-08-19: 2 mL

## 2024-08-19 MED ORDER — BUPIVACAINE HCL 0.25 % IJ SOLN
2.0000 mL | INTRAMUSCULAR | Status: AC | PRN
  Administered 2024-08-19: 2 mL via INTRA_ARTICULAR

## 2024-08-19 MED ORDER — METHYLPREDNISOLONE ACETATE 40 MG/ML IJ SUSP
40.0000 mg | INTRAMUSCULAR | Status: AC | PRN
  Administered 2024-08-19: 40 mg via INTRA_ARTICULAR

## 2024-08-19 NOTE — Progress Notes (Signed)
 "  Office Visit Note   Patient: Marcus Taylor           Date of Birth: 1941-05-28           MRN: [id number] Visit Date: 08/19/2024              Requested by: Catherine Charlies LABOR, DO [address] [place],  [place] [address] PCP: Catherine Charlies LABOR, DO   Assessment & Plan: Visit Diagnoses:  1. Unilateral primary osteoarthritis, left knee     Plan: Impression is left tibia pain likely referred from the left knee.  Exam of the knee and tibia/fibula is unremarkable.  Does have moderate degenerative changes to the left knee.  We discussed diagnostic and hopefully therapeutic cortisone injection of the left knee for which she is agreeable to.  Follow-up with us  as needed.  Follow-Up Instructions: Return if symptoms worsen or fail to improve.   Orders:  Orders Placed This Encounter  Procedures   Large Joint Inj   XR KNEE 3 VIEW LEFT   No orders of the defined types were placed in this encounter.     Procedures: Large Joint Inj: L knee on 08/19/2024 10:24 AM Indications: pain Details: 22 G needle, anterolateral approach Medications: 2 mL lidocaine  1 %; 2 mL bupivacaine  0.25 %; 40 mg methylPREDNISolone  acetate 40 MG/ML      Clinical Data: No additional findings.   Subjective: Chief Complaint  Patient presents with   Left Knee - Pain    HPI patient is a pleasant 84 year old gentleman who comes in today with left shin pain.  Symptoms began about 2-1/2 months or so ago.  He denies any injury or change in activity.  The pain is primarily to the anterior shin and is constant.  Symptoms are worse when he is sleeping as well as occasionally when he is walking.  It is described as a throb type feeling.  He has been taking Tylenol  without relief.  No fevers or chills or other systemic symptoms.  Of note, he is status post right total knee replacement is doing well.  Review of Systems as detailed in HPI.  All others reviewed and are negative.   Objective: Vital Signs: There were no vitals  taken for this visit.  Physical Exam well-developed well-nourished gentleman in no acute distress.  Alert and oriented x 3.  Ortho Exam left knee exam: No effusion.  Range of motion 0 to 120 degrees.  No joint line tenderness.  No tenderness along the tibia or fibula or around the periosteum.  He is neurovascularly intact distally.  Specialty Comments:  EXAM: MRI CERVICAL SPINE WITHOUT CONTRAST   TECHNIQUE: Multiplanar, multisequence MR imaging of the cervical spine was performed. No intravenous contrast was administered.   COMPARISON:  Cervical spine radiographs 02/19/2018.   FINDINGS: Alignment: Stable from the recent radiographs. Moderate degenerative appearing anterolisthesis of C7 on T1. Mild degenerative anterolisthesis of C4 on C5. Mild superimposed straightening of cervical lordosis.   Vertebrae: Visualized bone marrow signal is within normal limits. Intermittent degenerative endplate marrow signal changes, maximal at C6-C7 and C7-T1. No marrow edema or evidence of acute osseous abnormality.   Cord: No definite abnormal spinal cord signal despite stenosis with spinal cord mass effect at C3-C4, details below. The visible upper thoracic spinal cord is within normal limits.   Posterior Fossa, vertebral arteries, paraspinal tissues: Cervicomedullary junction is within normal limits. Negative visible brain parenchyma. Dominant and mildly dolichoectatic left vertebral artery, the right is diminutive.  The major vascular flow voids in the neck are preserved.   There is lobular asymmetric and indeterminate appearing signal abnormality at the right thoracic inlet lateral to the right thyroid  seen on series 6, image 30. This is not included on the sagittal images. Etiology is unclear, but these seem to be dilated venous vascular structures. No significant regional mass effect.   Disc levels:   C2-C3: Mild to moderate facet hypertrophy greater on the right. Mild broad-based  posterior disc bulging. Mild ligament flavum hypertrophy. Mild spinal stenosis. No definite spinal cord mass effect. Moderate bilateral C3 foraminal stenosis.   C3-C4: Mild left but moderate to severe right side facet hypertrophy with severe ligament flavum hypertrophy. Superimposed circumferential disc bulging and endplate spurring with a broad-based posterior component. Spinal stenosis with mild to moderate spinal cord mass effect. Moderate to severe left and severe right C4 foraminal stenosis.   C4-C5: Moderate facet and ligament flavum hypertrophy greater on the right. Superimposed circumferential disc bulge and endplate spurring with a small broad-based central disc protrusion. Mild spinal stenosis with no cord mass effect. Mild to moderate left and moderate to severe right C5 foraminal stenosis.   C5-C6: Disc space loss with bulky but mostly anterior and foraminal circumferential disc osteophyte complex. Mild facet hypertrophy. No significant spinal stenosis. Moderate to severe bilateral C6 foraminal stenosis.   C6-C7: Disc space loss with circumferential disc osteophyte complex eccentric to the left. Mild ligament flavum hypertrophy. No significant spinal stenosis. Severe left and moderate to severe right C7 foraminal stenosis.   C7-T1: Anterolisthesis with near complete disc space loss. Moderate to severe facet and ligament flavum hypertrophy bilaterally. No significant spinal stenosis, but moderate to severe bilateral C8 foraminal stenosis.   No upper thoracic spinal stenosis.   IMPRESSION: 1. Suggestion of a venous vascular malformation at the right thoracic inlet (series 6, image 30). Ordinarily clinical significance would be doubtful, but in the setting of right shoulder pain recommend further evaluation. Chest CT with IV contrast may be the simplest way to evaluate further. Alternative imaging modalities include right brachial plexus MRI (without and with  contrast), right shoulder MRI (larger than standard field-of-view, without and with contrast). 2. Advanced cervical spine degeneration with multilevel spondylolisthesis. No acute osseous abnormality identified. 3. Multifactorial spinal stenosis with up to moderate spinal cord mass effect at C3-C4, but no cord signal abnormality. Mild spinal stenosis at C2-C3, C4-C5. 4. Multifactorial moderate and severe cervical neural foraminal stenosis at all levels.     Electronically Signed   By: VEAR Hurst M.D.   On: 03/05/2018 09:39  Imaging: XR KNEE 3 VIEW LEFT Result Date: 08/19/2024 Moderate tricompartmental degenerative changes    PMFS History: Patient Active Problem List   Diagnosis Date Noted   Positional lightheadedness 03/31/2024   Atherosclerosis of right carotid artery ~45%, without significant stenosis 03/31/2024   Vertebral artery stenosis, right-right 03/31/2024   Internal carotid artery stenosis, right-mild 03/31/2024   Internal carotid artery stenosis, left-mild to moderate 03/31/2024   Encounter for monitoring long-term proton pump inhibitor therapy 10/11/2023   Gastroesophageal reflux disease without esophagitis 10/11/2023   Microalbuminuria 10/11/2023   B12 deficiency 04/26/2023   Carpal tunnel syndrome of right wrist 06/14/2022   Type 2 diabetes mellitus with hyperlipidemia (HCC) 07/04/2021   Liver cirrhosis (HCC) 07/25/2018   Pancytopenia, acquired (HCC) 04/19/2018   Primary osteoarthritis of right knee 04/19/2018   Decreased hearing of both ears 03/20/2018   Splenomegaly 03/12/2018   Enlarged prostate without lower urinary tract symptoms (  luts) 03/12/2018   IDA (iron deficiency anemia) 08/09/2016   Thrombocytopenia 05/30/2016   History of ST elevation myocardial infarction (STEMI) 01/27/2016   CAD (coronary artery disease) 01/27/2016   Anemia in neoplastic disease 05/25/2014   Splenic marginal zone b-cell lymphoma (HCC) 12/18/2011   HTN (hypertension)    Past  Medical History:  Diagnosis Date   Anemia    Arthritis    Cervical radiculopathy 12/22/2019   Colonic polyp    Concussion 03/05/2018   Coronary artery disease    stents x 3 (03-2014)   Diabetes mellitus    Diverticulosis    throughout entire colon   History of colon polyps 01/27/2016   04/2017:  Three sessile polyps were found in the ascending colon. The polyps were 3 mm in size.  These polyps were removed with a cold biopsy forceps. Resection and retrieval were  complete.  - Two sessile polyps were found in the ascending colon. The polyps were 5 to 6 mm in size.  These polyps were removed with a cold snare. Resection and retrieval were complete.  - A 3 mm polyp was found in the    History of total right knee replacement 07/09/2018   HTN (hypertension)    Hypertriglyceridemia    Ingrown toenail 10/21/2019   Kidney stone    Left hamstring injury 11/02/2020   Lymphocytosis    Metatarsal fracture left   left 5th proximal phalanx    Neck pain 02/19/2018   OSA (obstructive sleep apnea)    Mild by records   PONV (postoperative nausea and vomiting)    first time, no problem since then   RBBB 01/2016   Right inguinal hernia 02/03/2016   ? 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   Splenomegaly    STEMI (ST elevation myocardial infarction) [facility])    August 2015   Subarachnoid hematoma (HCC) 03/12/2018   Subarachnoid hemorrhage (HCC) 03/05/2018    Family History  Problem Relation Age of Onset   Kidney disease Sister    Colon cancer Sister 56   Colon cancer Brother    Bone cancer Brother    Arthritis Mother    Breast cancer Mother    Heart disease Mother    Early death Father    Arthritis Son    Esophageal cancer Sister    Bladder Cancer Sister    Rectal cancer Neg Hx    Stomach cancer Neg Hx     Past Surgical History:  Procedure Laterality Date   CARDIAC CATHETERIZATION     stents x 3 (03/2014)   CHOLECYSTECTOMY     INGUINAL  HERNIA REPAIR Right 05/27/2015   Procedure: LAPAROSCOPIC REPAIR RIGHT  INGUINAL HERNIA;  Surgeon: Camellia Blush, MD;  Location: [facility];  Service: General;  Laterality: Right;   INSERTION OF MESH Right 05/27/2015   Procedure: INSERTION OF MESH;  Surgeon: Camellia Blush, MD;  Location: [facility];  Service: General;  Laterality: Right;   LUMBAR DISC SURGERY     TONSILLECTOMY AND ADENOIDECTOMY     TOTAL KNEE ARTHROPLASTY Right 07/09/2018   Procedure: RIGHT TOTAL KNEE ARTHROPLASTY;  Surgeon: Anderson Maude ORN, MD;  Location: [facility] OR;  Service: Orthopedics;  Laterality: Right;   Social History   Occupational History   Occupation: retired    Comment: used to work as an chief technology officer; now retired  Tobacco Use   Smoking status: Former    Current packs/day: 0.00    Average packs/day:  1 pack/day for 20.0 years (20.0 ttl pk-yrs)    Types: Cigarettes    Start date: 08/14/1968    Quit date: 08/14/1988    Years since quitting: 36.0    Passive exposure: Past   Smokeless tobacco: Never  Vaping Use   Vaping status: Never Used  Substance and Sexual Activity   Alcohol use: No   Drug use: No   Sexual activity: Yes    Birth control/protection: None        "

## 2024-08-25 NOTE — Telephone Encounter (Signed)
 Communication  Reason for CRM: Nathanel Masker insurance broker [id number]   - stated they are looking for the chronic condition form to be signed and sent back insurance will be canceled on 01/31 if not completed   Form printed from pt chart and refaxed.

## 2024-09-08 ENCOUNTER — Emergency Department (HOSPITAL_BASED_OUTPATIENT_CLINIC_OR_DEPARTMENT_OTHER)

## 2024-09-08 ENCOUNTER — Encounter (HOSPITAL_BASED_OUTPATIENT_CLINIC_OR_DEPARTMENT_OTHER): Payer: Self-pay | Admitting: *Deleted

## 2024-09-08 ENCOUNTER — Emergency Department (HOSPITAL_BASED_OUTPATIENT_CLINIC_OR_DEPARTMENT_OTHER)
Admission: EM | Admit: 2024-09-08 | Discharge: 2024-09-08 | Disposition: A | Discharge status: Home / Self Care | Attending: Emergency Medicine | Admitting: Emergency Medicine

## 2024-09-08 ENCOUNTER — Other Ambulatory Visit: Payer: Self-pay

## 2024-09-08 ENCOUNTER — Ambulatory Visit

## 2024-09-08 DIAGNOSIS — I10 Essential (primary) hypertension: Secondary | ICD-10-CM | POA: Insufficient documentation

## 2024-09-08 DIAGNOSIS — R10A1 Flank pain, right side: Secondary | ICD-10-CM | POA: Insufficient documentation

## 2024-09-08 DIAGNOSIS — Z7982 Long term (current) use of aspirin: Secondary | ICD-10-CM | POA: Insufficient documentation

## 2024-09-08 DIAGNOSIS — E119 Type 2 diabetes mellitus without complications: Secondary | ICD-10-CM | POA: Diagnosis not present

## 2024-09-08 DIAGNOSIS — I2510 Atherosclerotic heart disease of native coronary artery without angina pectoris: Secondary | ICD-10-CM | POA: Insufficient documentation

## 2024-09-08 DIAGNOSIS — D72829 Elevated white blood cell count, unspecified: Secondary | ICD-10-CM | POA: Insufficient documentation

## 2024-09-08 DIAGNOSIS — R319 Hematuria, unspecified: Secondary | ICD-10-CM | POA: Insufficient documentation

## 2024-09-08 LAB — CBC
HCT: 37.9 % — ABNORMAL LOW (ref 39.0–52.0)
Hemoglobin: 12.2 g/dL — ABNORMAL LOW (ref 13.0–17.0)
MCH: 29.4 pg (ref 26.0–34.0)
MCHC: 32.2 g/dL (ref 30.0–36.0)
MCV: 91.3 fL (ref 80.0–100.0)
Platelets: 121 10*3/uL — ABNORMAL LOW (ref 150–400)
RBC: 4.15 MIL/uL — ABNORMAL LOW (ref 4.22–5.81)
RDW: 17.4 % — ABNORMAL HIGH (ref 11.5–15.5)
WBC: 28.5 10*3/uL — ABNORMAL HIGH (ref 4.0–10.5)
nRBC: 0 % (ref 0.0–0.2)

## 2024-09-08 LAB — URINALYSIS, W/ REFLEX TO CULTURE (INFECTION SUSPECTED)
Bacteria, UA: NONE SEEN
Bilirubin Urine: NEGATIVE
Glucose, UA: >1000 mg/dL — AB
Ketones, ur: NEGATIVE mg/dL
Nitrite: NEGATIVE
Specific Gravity, Urine: 1.024 (ref 1.005–1.030)
pH: 5.5 (ref 5.0–8.0)

## 2024-09-08 LAB — BASIC METABOLIC PANEL WITH GFR
Anion gap: 9 (ref 5–15)
BUN: 27 mg/dL — ABNORMAL HIGH (ref 8–23)
CO2: 29 mmol/L (ref 22–32)
Calcium: 9.6 mg/dL (ref 8.9–10.3)
Chloride: 106 mmol/L (ref 98–111)
Creatinine, Ser: 0.83 mg/dL (ref 0.61–1.24)
GFR, Estimated: >60 mL/min
Glucose, Bld: 141 mg/dL — ABNORMAL HIGH (ref 70–99)
Potassium: 4.2 mmol/L (ref 3.5–5.1)
Sodium: 144 mmol/L (ref 135–145)

## 2024-09-08 NOTE — ED Triage Notes (Signed)
 C/o right flank/back pain that started at 1am. Pt felt nauseated but didn't vomit. States pain comes and goes. Describes as sharp. Took tylenol  at 2300. Denies any difficulty urinating. Denies any back injury. Remote history of kidney stone.

## 2024-09-08 NOTE — ED Provider Notes (Signed)
 " [place] EMERGENCY DEPARTMENT AT [facility] Provider Note   CSN: [id number] Arrival date & time: 09/08/24  9476     Patient presents with: Flank Pain   Marcus Taylor is a 84 y.o. male.    Flank Pain     84 year old male with medical history significant for diabetes mellitus, HTN, HLD, CAD/STEMI status post stent placement, subarachnoid hemorrhage, lymphoma, gluteal hernia status post repair with mesh, presenting to the emergency department with a chief complaint of right-sided flank pain.  Symptoms came on around 1 AM.  He describes sudden onset right-sided flank pain that was intermittent, associated with waves of nausea.  The patient was doubled over in pain, had no episodes of vomiting.  He denies any difficulty urinating, pain with urinating, frequency.  No fall or trauma to his back.  Pain was not radiating down his legs.  No chest pain or shortness of breath.  He denies any fevers or chills.  Pain is now completely resolved and he feels asymptomatic at this time.  Prior to Admission medications  Medication Sig Start Date End Date Taking? Authorizing Provider  amLODipine  (NORVASC ) 5 MG tablet Take 1 tablet (5 mg total) by mouth daily. 03/27/24   Kuneff, Renee A, DO  aspirin  EC 81 MG tablet Take 81 mg by mouth daily.    [provider]  atorvastatin  (LIPITOR) 40 MG tablet Take 1 tablet (40 mg total) by mouth daily. 03/27/24   Kuneff, Renee A, DO  dapagliflozin  propanediol (FARXIGA ) 10 MG TABS tablet Take 1 tablet (10 mg total) by mouth daily before breakfast. 03/27/24   Kuneff, Renee A, DO  diclofenac  Sodium (VOLTAREN ) 1 % GEL Apply 2 g topically 4 (four) times daily. 07/10/23   Crain, Whitney L, PA  esomeprazole (NEXIUM) 20 MG capsule Take 20 mg by mouth every morning. 05/19/21   [provider]  ferrous sulfate  324 MG TBEC Take 324 mg by mouth daily.    [provider]  fluticasone  (FLONASE ) 50 MCG/ACT nasal spray Place 1 spray into both  nostrils in the morning and at bedtime. 08/21/23   Kuneff, Renee A, DO  furosemide  (LASIX ) 20 MG tablet Take 0.5-1 tablets (10-20 mg total) by mouth daily as needed. 03/27/24   Kuneff, Renee A, DO  gabapentin  (NEURONTIN ) 100 MG capsule Take 1-3 capsules (100-300 mg total) by mouth at bedtime. 03/27/24   Kuneff, Renee A, DO  lisinopril  (ZESTRIL ) 40 MG tablet Take 1 tablet (40 mg total) by mouth daily. 03/27/24   Kuneff, Renee A, DO  nitroGLYCERIN  (NITROSTAT ) 0.4 MG SL tablet Place 1 tablet (0.4 mg total) under the tongue every 5 (five) minutes as needed for chest pain. 03/31/21   Kuneff, Renee A, DO    Allergies: Patient has no known allergies.    Review of Systems  Genitourinary:  Positive for flank pain.  All other systems reviewed and are negative.   Updated Vital Signs BP (!) 120/52 (BP Location: Right Arm)   Pulse (!) 50   Temp (!) 97.5 F (36.4 C)   Resp 20   Ht 5' 8 (1.727 m)   Wt 77.6 kg   SpO2 100%   BMI 26.00 kg/m   Physical Exam Vitals and nursing note reviewed.  Constitutional:      General: He is not in acute distress.    Appearance: He is well-developed.  HENT:     Head: Normocephalic and atraumatic.  Eyes:     Conjunctiva/sclera: Conjunctivae normal.  Cardiovascular:  Rate and Rhythm: Normal rate and regular rhythm.     Heart sounds: No murmur heard. Pulmonary:     Effort: Pulmonary effort is normal. No respiratory distress.     Breath sounds: Normal breath sounds.  Abdominal:     Palpations: Abdomen is soft.     Tenderness: There is no abdominal tenderness. There is no right CVA tenderness, left CVA tenderness or guarding.     Comments: Abdomen is soft, nontender, nondistended, no rebound or guarding, no masses palpated, no CVA tenderness  Musculoskeletal:        General: No swelling.     Cervical back: Neck supple.  Skin:    General: Skin is warm and dry.     Capillary Refill: Capillary refill takes less than 2 seconds.  Neurological:     Mental  Status: He is alert.  Psychiatric:        Mood and Affect: Mood normal.     (all labs ordered are listed, but only abnormal results are displayed) Labs Reviewed  BASIC METABOLIC PANEL WITH GFR - Abnormal; Notable for the following components:      Result Value   Glucose, Bld 141 (*)    BUN 27 (*)    All other components within normal limits  CBC - Abnormal; Notable for the following components:   WBC 28.5 (*)    RBC 4.15 (*)    Hemoglobin 12.2 (*)    HCT 37.9 (*)    RDW 17.4 (*)    Platelets 121 (*)    All other components within normal limits  URINALYSIS, W/ REFLEX TO CULTURE (INFECTION SUSPECTED) - Abnormal; Notable for the following components:   Glucose, UA >1,000 (*)    Hgb urine dipstick SMALL (*)    Protein, ur TRACE (*)    Leukocytes,Ua TRACE (*)    All other components within normal limits    EKG: None  Radiology: No results found.   Procedures   Medications Ordered in the ED - No data to display  Clinical Course as of 09/08/24 0619  Mon Sep 08, 2024  0610 Hgb urine dipstick(!): SMALL [JL]  0610 RBC / HPF: 21-50 [JL]  0610 WBC, UA: 6-10 [JL]  0610 Nitrite: NEGATIVE [JL]  0610 Bacteria, UA: NONE SEEN [JL]    Clinical Course User Index [JL] Jerrol Agent, MD                                 Medical Decision Making Amount and/or Complexity of Data Reviewed Labs: ordered. Decision-making details documented in ED Course.     84 year old male with medical history significant for diabetes mellitus, HTN, HLD, CAD/STEMI status post stent placement, subarachnoid hemorrhage, lymphoma, gluteal hernia status post repair with mesh, presenting to the emergency department with a chief complaint of right-sided flank pain.  Symptoms came on around 1 AM.  He describes sudden onset right-sided flank pain that was intermittent, associated with waves of nausea.  The patient was doubled over in pain, had no episodes of vomiting.  He denies any difficulty urinating, pain  with urinating, frequency.  No fall or trauma to his back.  Pain was not radiating down his legs.  No chest pain or shortness of breath.  He denies any fevers or chills.  Pain is now completely resolved and he feels asymptomatic at this time.  On arrival, the patient was afebrile, not tachycardic or tachypneic, hemodynamically stable, BP 120/48,  saturating 90% on room air.  On exam the patient had an abdomen that was soft, nontender, nondistended, no rebound or guarding.  He had no CVA tenderness, no evidence of rash, no abdominal masses palpated.  Patient was pain-free on my evaluation, offered screening laboratory evaluation.  Suspect nephrolithiasis that has since passed as the patient is now asymptomatic.  Low concern for other acute intra-abdominal etiology at this time, patient without pain, no active vomiting, reassuring abdominal exam.  Offered CT imaging which the patient declined.  Labs: CBC with a leukocytosis at baseline for the patient is no lymphoma at 28.5, hemoglobin at baseline at 12.2, BMP without evidence of an AKI, creatinine 0.83, no significant electrolyte abnormality, urinalysis with mild hematuria, negative for UTI.  With the finding of hematuria, suspect likely passed stone, patient remained asymptomatic over an hour in the emergency department and is comfortable with the plan for discharge.  Provided the patient with return precautions, patient overall stable for discharge.     Final diagnoses:  Right flank pain  Hematuria, unspecified type    ED Discharge Orders     None          Jerrol Agent, MD 09/08/24 [phone]  "

## 2024-09-08 NOTE — Discharge Instructions (Addendum)
 Your laboratory evaluation showed a leukocytosis at baseline for your known lymphoma, no evidence of kidney injury, normal electrolytes, evidence of blood in your urine without evidence of urinary tract infection.  Given your symptoms, it is possible that you passed a kidney stone.  You remained pain-free throughout your entire time in the emergency department, were offered CT imaging to further evaluate and declined.  Return to the emergency department for any severe worsening abdominal pain, flank pain, syncope or any other concerns or complaints.

## 2024-09-11 ENCOUNTER — Encounter: Payer: Self-pay | Admitting: Family Medicine

## 2024-09-11 ENCOUNTER — Ambulatory Visit: Admitting: Family Medicine

## 2024-09-11 VITALS — BP 108/66 | HR 59 | Temp 98.1°F | Wt 172.0 lb

## 2024-09-11 DIAGNOSIS — E785 Hyperlipidemia, unspecified: Secondary | ICD-10-CM | POA: Diagnosis not present

## 2024-09-11 DIAGNOSIS — K7460 Unspecified cirrhosis of liver: Secondary | ICD-10-CM

## 2024-09-11 DIAGNOSIS — C8307 Small cell B-cell lymphoma, spleen: Secondary | ICD-10-CM

## 2024-09-11 DIAGNOSIS — I6522 Occlusion and stenosis of left carotid artery: Secondary | ICD-10-CM | POA: Diagnosis not present

## 2024-09-11 DIAGNOSIS — I6521 Occlusion and stenosis of right carotid artery: Secondary | ICD-10-CM

## 2024-09-11 DIAGNOSIS — D630 Anemia in neoplastic disease: Secondary | ICD-10-CM

## 2024-09-11 DIAGNOSIS — E538 Deficiency of other specified B group vitamins: Secondary | ICD-10-CM

## 2024-09-11 DIAGNOSIS — I10 Essential (primary) hypertension: Secondary | ICD-10-CM | POA: Diagnosis not present

## 2024-09-11 DIAGNOSIS — D696 Thrombocytopenia, unspecified: Secondary | ICD-10-CM | POA: Diagnosis not present

## 2024-09-11 DIAGNOSIS — E1169 Type 2 diabetes mellitus with other specified complication: Secondary | ICD-10-CM

## 2024-09-11 DIAGNOSIS — K219 Gastro-esophageal reflux disease without esophagitis: Secondary | ICD-10-CM | POA: Diagnosis not present

## 2024-09-11 DIAGNOSIS — D508 Other iron deficiency anemias: Secondary | ICD-10-CM | POA: Diagnosis not present

## 2024-09-11 DIAGNOSIS — D61818 Other pancytopenia: Secondary | ICD-10-CM

## 2024-09-11 DIAGNOSIS — I2510 Atherosclerotic heart disease of native coronary artery without angina pectoris: Secondary | ICD-10-CM | POA: Diagnosis not present

## 2024-09-11 DIAGNOSIS — R809 Proteinuria, unspecified: Secondary | ICD-10-CM

## 2024-09-11 DIAGNOSIS — I252 Old myocardial infarction: Secondary | ICD-10-CM

## 2024-09-11 DIAGNOSIS — Z5181 Encounter for therapeutic drug level monitoring: Secondary | ICD-10-CM

## 2024-09-11 LAB — VITAMIN D 25 HYDROXY (VIT D DEFICIENCY, FRACTURES): VITD: 43.65 ng/mL (ref 30.00–100.00)

## 2024-09-11 LAB — LIPID PANEL
Cholesterol: 98 mg/dL (ref 28–200)
HDL: 35.5 mg/dL — ABNORMAL LOW
LDL Cholesterol: 48 mg/dL (ref 10–99)
NonHDL: 62.62
Total CHOL/HDL Ratio: 3
Triglycerides: 74 mg/dL (ref 10.0–149.0)
VLDL: 14.8 mg/dL (ref 0.0–40.0)

## 2024-09-11 LAB — TSH: TSH: 2.03 u[IU]/mL (ref 0.35–5.50)

## 2024-09-11 LAB — MICROALBUMIN / CREATININE URINE RATIO
Creatinine,U: 52.6 mg/dL
Microalb Creat Ratio: 100.5 mg/g — ABNORMAL HIGH (ref 0.0–30.0)
Microalb, Ur: 5.3 mg/dL — ABNORMAL HIGH (ref 0.7–1.9)

## 2024-09-11 LAB — POCT GLYCOSYLATED HEMOGLOBIN (HGB A1C)
HbA1c POC (<> result, manual entry): 5.6 %
HbA1c, POC (controlled diabetic range): 5.6 % (ref 0.0–7.0)
HbA1c, POC (prediabetic range): 5.6 % — AB (ref 5.7–6.4)
Hemoglobin A1C: 5.6 % (ref 4.0–5.6)

## 2024-09-11 LAB — IBC + FERRITIN
Ferritin: 46.5 ng/mL (ref 22.0–322.0)
Iron: 73 ug/dL (ref 42–165)
Saturation Ratios: 26.6 % (ref 20.0–50.0)
TIBC: 274.4 ug/dL (ref 250.0–450.0)
Transferrin: 196 mg/dL — ABNORMAL LOW (ref 212.0–360.0)

## 2024-09-11 LAB — MAGNESIUM: Magnesium: 2.2 mg/dL (ref 1.5–2.5)

## 2024-09-11 MED ORDER — GABAPENTIN 100 MG PO CAPS: 100.0000 mg | ORAL_CAPSULE | Freq: Every day | ORAL | 5 refills | Status: AC

## 2024-09-11 MED ORDER — ATORVASTATIN CALCIUM 40 MG PO TABS: 40.0000 mg | ORAL_TABLET | Freq: Every day | ORAL | 3 refills | Status: AC

## 2024-09-11 MED ORDER — FUROSEMIDE 20 MG PO TABS: 10.0000 mg | ORAL_TABLET | Freq: Every day | ORAL | 1 refills | Status: AC | PRN

## 2024-09-11 MED ORDER — DAPAGLIFLOZIN PROPANEDIOL 10 MG PO TABS: 10.0000 mg | ORAL_TABLET | Freq: Every day | ORAL | 5 refills | Status: AC

## 2024-09-11 MED ORDER — AMLODIPINE BESYLATE 5 MG PO TABS: 5.0000 mg | ORAL_TABLET | Freq: Every day | ORAL | 1 refills | Status: AC

## 2024-09-11 MED ORDER — LISINOPRIL 40 MG PO TABS: 40.0000 mg | ORAL_TABLET | Freq: Every day | ORAL | 1 refills | Status: AC

## 2024-09-11 MED ORDER — CYANOCOBALAMIN 1000 MCG/ML IJ SOLN
1000.0000 ug | Freq: Once | INTRAMUSCULAR | Status: AC
  Administered 2024-09-11: 1000 ug via INTRAMUSCULAR

## 2024-09-11 NOTE — Patient Instructions (Addendum)

## 2024-09-11 NOTE — Progress Notes (Signed)
 "    Patient ID: Marcus Taylor, male  DOB: [birth year], 84 y.o.   MRN: [id number] Patient Care Team    Relationship Specialty Notifications Start End  Catherine Charlies LABOR, DO PCP - General Family Medicine  01/24/22   Lonn Hicks, MD Consulting Physician Hematology and Oncology  01/26/16   Carles Gear, MD Consulting Physician Neurosurgery  01/27/16   Elspeth Lauraine DEL, OD Referring Physician   05/03/16    Comment: opthalmology   Gershon Donnice SAUNDERS, DPM Consulting Physician Podiatry  06/14/18   Alyse Agent, MD Consulting Physician Orthopedic Surgery  10/11/23   Margaret Eduard SAUNDERS, MD Consulting Physician Neurology  09/11/24   Jeffrie Oneil BROCKS, MD Consulting Physician Cardiology  09/11/24     Chief Complaint  Patient presents with   Hypertension    Pt is fasting.     Subjective: Marcus Taylor is a 84 y.o. male present for Chronic Conditions/illness Management All past medical history, surgical history, allergies, family history, immunizations,  and social history were updated in the electronic medical record today. All recent labs, ED visits and hospitalizations within the last year were reviewed. Medication reconciliation completed today  Hypertension/H/o MI/CAD/stentsx3/bilateral internal carotid artery stenosis/CAD: Pt has a history of cardiac stent  placement x 3 and MI in 2015. Patient reports compliance with Lipitor 40 mg, lisinopril  40 mg(BID),amlodipine  5 mg qd. He takes a daily baby asa.  Lasix  10-20 mg qd prn.  He is prescribed nitro, he -has not needed it.  Plavix   stopped (> 36yr since stent)  .  Patient denies chest pain, shortness of breath, dizziness or lower extremity edema.   He is established with cardiology-Dr. Jeffrie.  Last seen 07/25/2024  Diabetes type 2/microalbuminuria: Diet controlled.  Patient denies dizziness, hyperglycemic or hypoglycemic events. Patient denies numbness, tingling in the extremities or nonhealing wounds of feet.   He reports compliance with  taking Farxiga  for microalbuminuria-tolerating Patient reports he taking gabapentin  100-300 mg nightly and feels this is working well for him.  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/iron deficiency anemia Follows closely with heme-onc     09/11/2024    8:17 AM 10/11/2023    8:06 AM 09/05/2023   10:01 AM 11/09/2022    7:58 AM 08/23/2022    8:02 AM  Depression screen PHQ 2/9  Decreased Interest 0 0 0 0 0  Down, Depressed, Hopeless 0 0 0 0 0  PHQ - 2 Score 0 0 0 0 0  Altered sleeping 0 0 0    Tired, decreased energy 0 0 0    Change in appetite 0 0 0    Feeling bad or failure about yourself  0 0 0    Trouble concentrating 0 0 0    Moving slowly or fidgety/restless 0 0 0    Suicidal thoughts 0 0 0    PHQ-9 Score 0 0  0     Difficult doing work/chores Not difficult at all Not difficult at all Not difficult at all       Data saved with a previous flowsheet row definition        09/11/2024    8:15 AM 10/11/2023    8:04 AM 09/05/2023    8:16 AM 11/09/2022    7:58 AM 08/23/2022    8:02 AM  Fall Risk   Falls in the past year? 0 0 0 0 0  Number falls in past yr: 0  0 0 0  Injury with Fall? 0  0  0  0   Risk for fall due to : History of fall(s)      Follow up Falls evaluation completed Falls evaluation completed Falls evaluation completed;Education provided;Falls prevention discussed Falls evaluation completed Falls evaluation completed      Data saved with a previous flowsheet row definition   Immunization History  Administered Date(s) Administered   Fluad Quad(high Dose 65+) 04/28/2019, 05/10/2020, 07/04/2021, 05/08/2022   Fluad Trivalent(High Dose 65+) 04/05/2023, 07/31/2024   INFLUENZA, HIGH DOSE SEASONAL PF 05/03/2016, 07/02/2017, 05/20/2018   Influenza Split 05/07/2012   Influenza,inj,Quad PF,6+ Mos 09/22/2013, 05/25/2014, 05/31/2015   PFIZER(Purple Top)SARS-COV-2 Vaccination 10/05/2019, 10/29/2019   Pneumococcal Conjugate-13  09/28/2014   Pneumococcal Polysaccharide-23 10/05/2011   Tdap 03/24/2014   Zoster Recombinant(Shingrix) 05/30/2018, 09/17/2018    Past Medical History:  Diagnosis Date   Anemia    Arthritis    Cervical radiculopathy 12/22/2019   Colonic polyp    Concussion 03/05/2018   Coronary artery disease    stents x 3 (03-2014)   Diabetes mellitus    Diverticulosis    throughout entire colon   History of colon polyps 01/27/2016   04/2017:  Three sessile polyps were found in the ascending colon. The polyps were 3 mm in size.  These polyps were removed with a cold biopsy forceps. Resection and retrieval were  complete.  - Two sessile polyps were found in the ascending colon. The polyps were 5 to 6 mm in size.  These polyps were removed with a cold snare. Resection and retrieval were complete.  - A 3 mm polyp was found in the    History of total right knee replacement 07/09/2018   HTN (hypertension)    Hypertriglyceridemia    Ingrown toenail 10/21/2019   Kidney stone    Left hamstring injury 11/02/2020   Lymphocytosis    Metatarsal fracture left   left 5th proximal phalanx    Neck pain 02/19/2018   OSA (obstructive sleep apnea)    Mild by records   PONV (postoperative nausea and vomiting)    first time, no problem since then   RBBB 01/2016   Right inguinal hernia 02/03/2016   ? 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   Splenomegaly    STEMI (ST elevation myocardial infarction) [facility])    August 2015   Subarachnoid hematoma (HCC) 03/12/2018   Subarachnoid hemorrhage (HCC) 03/05/2018   No Known Allergies Past Surgical History:  Procedure Laterality Date   CARDIAC CATHETERIZATION     stents x 3 (03/2014)   CHOLECYSTECTOMY     INGUINAL HERNIA REPAIR Right 05/27/2015   Procedure: LAPAROSCOPIC REPAIR RIGHT  INGUINAL HERNIA;  Surgeon: Camellia Blush, MD;  Location: [facility];  Service: General;  Laterality: Right;   INSERTION OF MESH Right  05/27/2015   Procedure: INSERTION OF MESH;  Surgeon: Camellia Blush, MD;  Location: [facility];  Service: General;  Laterality: Right;   LUMBAR DISC SURGERY     TONSILLECTOMY AND ADENOIDECTOMY     TOTAL KNEE ARTHROPLASTY Right 07/09/2018   Procedure: RIGHT TOTAL KNEE ARTHROPLASTY;  Surgeon: Anderson Maude ORN, MD;  Location: [facility] OR;  Service: Orthopedics;  Laterality: Right;   Family History  Problem Relation Age of Onset   Kidney disease Sister    Colon cancer Sister 70   Colon cancer Brother    Bone cancer Brother    Arthritis Mother    Breast cancer Mother    Heart disease Mother  Early death Father    Arthritis Son    Esophageal cancer Sister    Bladder Cancer Sister    Rectal cancer Neg Hx    Stomach cancer Neg Hx    Social History   Social History Narrative   Married to [place].    HS education. Retired.    Former smoker. No drugs or ETOH.   Wears seatbelt.   Wears dentures.   Smoke detector in the home. Firearms  In the home.    Feels safe in his relationships.     Allergies as of 09/11/2024   No Known Allergies      Medication List        Accurate as of September 11, 2024  8:38 AM. If you have any questions, ask your nurse or doctor.          STOP taking these medications    fluticasone  50 MCG/ACT nasal spray Commonly known as: FLONASE  Stopped by: Charlies Bellini, DO       TAKE these medications    amLODipine  5 MG tablet Commonly known as: NORVASC  Take 1 tablet (5 mg total) by mouth daily.   aspirin  EC 81 MG tablet Take 81 mg by mouth daily.   atorvastatin  40 MG tablet Commonly known as: LIPITOR Take 1 tablet (40 mg total) by mouth daily.   dapagliflozin  propanediol 10 MG Tabs tablet Commonly known as: Farxiga  Take 1 tablet (10 mg total) by mouth daily before breakfast.   diclofenac  Sodium 1 % Gel Commonly known as: VOLTAREN  Apply 2 g topically 4 (four) times daily.   esomeprazole 20 MG capsule Commonly known as: NEXIUM Take 20 mg by mouth  Taylor morning.   ferrous sulfate  324 MG Tbec Take 324 mg by mouth daily.   furosemide  20 MG tablet Commonly known as: LASIX  Take 0.5-1 tablets (10-20 mg total) by mouth daily as needed.   gabapentin  100 MG capsule Commonly known as: NEURONTIN  Take 1-3 capsules (100-300 mg total) by mouth at bedtime.   lisinopril  40 MG tablet Commonly known as: ZESTRIL  Take 1 tablet (40 mg total) by mouth daily.   nitroGLYCERIN  0.4 MG SL tablet Commonly known as: NITROSTAT  Place 1 tablet (0.4 mg total) under the tongue Taylor 5 (five) minutes as needed for chest pain.       All past medical history, surgical history, allergies, family history, immunizations andmedications were updated in the EMR today and reviewed under the history and medication portions of their EMR.      Review of Systems  Constitutional: Negative.   HENT: Negative.    Eyes: Negative.   Respiratory: Negative.    Cardiovascular: Negative.   Gastrointestinal: Negative.   Genitourinary: Negative.   Musculoskeletal: Negative.   Skin: Negative.   Neurological: Negative.   Endo/Heme/Allergies: Negative.   Psychiatric/Behavioral: Negative.    All other systems reviewed and are negative.  14 pt review of systems performed and negative (unless mentioned in an HPI)  Objective: BP 108/66   Pulse (!) 59   Temp 98.1 F (36.7 C)   Wt 172 lb (78 kg)   SpO2 97%   BMI 26.15 kg/m  Physical Exam Vitals and nursing note reviewed. Exam conducted with a chaperone present.  Constitutional:      General: He is not in acute distress.    Appearance: Normal appearance. He is not ill-appearing, toxic-appearing or diaphoretic.  HENT:     Head: Normocephalic and atraumatic.  Eyes:     General: No scleral icterus.  Right eye: No discharge.        Left eye: No discharge.     Extraocular Movements: Extraocular movements intact.     Pupils: Pupils are equal, round, and reactive to light.  Cardiovascular:     Rate and Rhythm:  Normal rate and regular rhythm.     Heart sounds: No murmur heard. Pulmonary:     Effort: Pulmonary effort is normal. No respiratory distress.     Breath sounds: Normal breath sounds. No wheezing, rhonchi or rales.  Musculoskeletal:     Cervical back: Neck supple.     Right lower leg: No edema.     Left lower leg: No edema.  Skin:    General: Skin is warm.     Findings: No rash.  Neurological:     Mental Status: He is alert and oriented to person, place, and time. Mental status is at baseline.  Psychiatric:        Mood and Affect: Mood normal.        Behavior: Behavior normal.        Thought Content: Thought content normal.        Judgment: Judgment normal.     Diabetic Foot Exam - Simple   Simple Foot Form Diabetic Foot exam was performed with the following findings: Yes 09/11/2024  8:36 AM  Visual Inspection No deformities, no ulcerations, no other skin breakdown bilaterally: Yes Sensation Testing Intact to touch and monofilament testing bilaterally: Yes Pulse Check Posterior Tibialis and Dorsalis pulse intact bilaterally: Yes Comments    Results for orders placed or performed in visit on 09/11/24 (from the past 48 hours)  POCT HgB A1C     Status: Abnormal   Collection Time: 09/11/24  8:23 AM  Result Value Ref Range   Hemoglobin A1C 5.6 4.0 - 5.6 %   HbA1c POC (<> result, manual entry) 5.6 4.0 - 5.6 %   HbA1c, POC (prediabetic range) 5.6 (A) 5.7 - 6.4 %   HbA1c, POC (controlled diabetic range) 5.6 0.0 - 7.0 %     Assessment/plan: Marcus Taylor is a 84 y.o. male present for chronic condition management Splenic marginal zone B-cell lymphoma/pancytopenia/iron deficiency anemia/anemia of neoplastic disease:  Follows with hematology-oncology Supplements iron  Controlled type 2 diabetes mellitus with complication, without long-term current use of insulin  (HCC) Diet controlled A1c: 6.2 --> 6.0>5.7>> 5.9>5.8>6.0> 5.9> 5.3> 5.4> 5.3 > off meds> 5.5> 5.7> 6.0>5.7>5.6  A1c collected today  Continue Farxiga -for microalbuminuria - Opthalmology: UTD 12/10/2023-Dr. Barts - flu shot up-to-date 2025 - foot exam completed 09/11/2024 and is established with podiatry for routine foot exams Taylor 3 months-last 08/18/2024. - History of microalbuminuria> 53 > 89.2 > 73.9 > collected today >compliant with Farxiga   Essential hypertension/Coronary artery disease involving native coronary artery of native heart without angina pectoris/History of ST elevation myocardial infarction (STEMI) Stable Continue lisinopril  40 mg daily Continue amlodipine  5 mg qd Continue statin  - Low sodium diet encouraged. Referred to cardiology-patient desired [facility] location.   Splenic Margicnal Zone B-cell lymphoma/thrombocytopenia/anemia of chronic disease/pancytopenia/iron deficiency anemia - Continue to  follow up with heme/onc.  - baseline WBC 25- 29> following with heme. No need to call critical lab -Reviewed recent results for CBC and CMP 09/08/2024  B12 deficiency B12 within normal range with Taylor 4-week B12 1000 mcg IM injection. Continue B12 injections by nurse visit 4 weeks B12 injection provided today  Liver cirrhosis CT 2019 resolved with liver cirrhosis-completed by oncology Most likely cause is due to  NASH Recommendations of dietary modification His liver enzymes have remained normal  GERD/long-term PPI use -Prescribed Nexium by specialty team.-Continue -B12 levels are supplemented with IM injection Taylor 4 weeks - Vitamin D  and magnesium  levels collected today  Return in about 24 weeks (around 02/26/2025) for cpe (20 min), Routine chronic condition follow-up.   Orders Placed This Encounter  Procedures   Urine Microalbumin w/creat. ratio   Lipid panel   IBC + Ferritin   TSH   Magnesium    Vitamin D  (25 hydroxy)   POCT HgB A1C   HM DIABETES FOOT EXAM   Meds ordered this encounter  Medications   amLODipine  (NORVASC ) 5 MG tablet    Sig: Take 1 tablet (5 mg  total) by mouth daily.    Dispense:  90 tablet    Refill:  1   dapagliflozin  propanediol (FARXIGA ) 10 MG TABS tablet    Sig: Take 1 tablet (10 mg total) by mouth daily before breakfast.    Dispense:  30 tablet    Refill:  5   furosemide  (LASIX ) 20 MG tablet    Sig: Take 0.5-1 tablets (10-20 mg total) by mouth daily as needed.    Dispense:  90 tablet    Refill:  1   gabapentin  (NEURONTIN ) 100 MG capsule    Sig: Take 1-3 capsules (100-300 mg total) by mouth at bedtime.    Dispense:  90 capsule    Refill:  5   lisinopril  (ZESTRIL ) 40 MG tablet    Sig: Take 1 tablet (40 mg total) by mouth daily.    Dispense:  90 tablet    Refill:  1   cyanocobalamin  (VITAMIN B12) injection 1,000 mcg   atorvastatin  (LIPITOR) 40 MG tablet    Sig: Take 1 tablet (40 mg total) by mouth daily.    Dispense:  90 tablet    Refill:  3   Referral Orders  No referral(s) requested today     Note is dictated utilizing voice recognition software. Although note has been proof read prior to signing, occasional typographical errors still can be missed. If any questions arise, please do not hesitate to call for verification.  Electronically signed by: Charlies Bellini, DO [facility]  "

## 2024-09-12 ENCOUNTER — Ambulatory Visit: Payer: Self-pay | Admitting: Family Medicine

## 2024-09-12 ENCOUNTER — Ambulatory Visit

## 2024-09-17 ENCOUNTER — Ambulatory Visit: Payer: Medicare Other

## 2024-09-17 VITALS — BP 129/78 | HR 64 | Temp 97.7°F | Ht 70.5 in | Wt 173.6 lb

## 2024-09-17 DIAGNOSIS — Z0000 Encounter for general adult medical examination without abnormal findings: Secondary | ICD-10-CM

## 2024-09-17 NOTE — Patient Instructions (Signed)
 Marcus Taylor,  Thank you for taking the time for your Medicare Wellness Visit. I appreciate your continued commitment to your health goals. Please review the care plan we discussed, and feel free to reach out if I can assist you further.  Please note that Annual Wellness Visits do not include a physical exam. Some assessments may be limited, especially if the visit was conducted virtually. If needed, we may recommend an in-person follow-up with your provider.  Ongoing Care Seeing your primary care provider every 3 to 6 months helps us  monitor your health and provide consistent, personalized care. You are due for a tetanus vaccine and can get that done at your local pharmacy. Each day, aim for 6 glasses of water, plenty of protein in your diet and try to get up and walk/ stretch every hour for 5-10 minutes at a time.    Referrals If a referral was made during today's visit and you haven't received any updates within two weeks, please contact the referred provider directly to check on the status.  Recommended Screenings:  Health Maintenance  Topic Date Due   Eye exam for diabetics  12/09/2024   Hemoglobin A1C  03/11/2025   Yearly kidney function blood test for diabetes  09/08/2025   Kidney health urinalysis for diabetes  09/11/2025   Complete foot exam   09/11/2025   Medicare Annual Wellness Visit  09/17/2025   Pneumococcal Vaccine for age over 33  Completed   Flu Shot  Completed   Zoster (Shingles) Vaccine  Completed   Meningitis B Vaccine  Aged Out   DTaP/Tdap/Td vaccine  Discontinued   Colon Cancer Screening  Discontinued   COVID-19 Vaccine  Discontinued       09/17/2024    8:09 AM  Advanced Directives  Does Patient Have a Medical Advance Directive? No    Vision: Annual vision screenings are recommended for early detection of glaucoma, cataracts, and diabetic retinopathy. These exams can also reveal signs of chronic conditions such as diabetes and high blood pressure.  Dental:  Annual dental screenings help detect early signs of oral cancer, gum disease, and other conditions linked to overall health, including heart disease and diabetes.  Please see the attached documents for additional preventive care recommendations.

## 2024-09-17 NOTE — Progress Notes (Signed)
 "  Chief Complaint  Patient presents with   Medicare Wellness     Subjective:   Marcus Taylor is a 84 y.o. male who presents for a Medicare Annual Wellness Visit.  Visit info / Clinical Intake: Medicare Wellness Visit Type:: Subsequent Annual Wellness Visit Persons participating in visit and providing information:: patient Medicare Wellness Visit Mode:: In-person (required for WTM) Interpreter Needed?: No Pre-visit prep was completed: yes AWV questionnaire completed by patient prior to visit?: no Living arrangements:: lives with spouse/significant other Patient's Overall Health Status Rating: good Typical amount of pain: none Does pain affect daily life?: no Are you currently prescribed opioids?: no  Dietary Habits and Nutritional Risks How many meals a day?: 3 Eats fruit and vegetables daily?: yes Most meals are obtained by: preparing own meals In the last 2 weeks, have you had any of the following?: none Diabetic:: (!) yes Any non-healing wounds?: no How often do you check your BS?: 0 Would you like to be referred to a Nutritionist or for Diabetic Management? : no  Functional Status Activities of Daily Living (to include ambulation/medication): Independent Ambulation: Independent with device- listed below Home Assistive Devices/Equipment: Eyeglasses Medication Administration: Independent Home Management (perform basic housework or laundry): Independent Manage your own finances?: yes Primary transportation is: driving Concerns about vision?: no *vision screening is required for WTM* Concerns about hearing?: no  Fall Screening Falls in the past year?: 0 Number of falls in past year: 0 Was there an injury with Fall?: 0 Fall Risk Category Calculator: 0 Patient Fall Risk Level: Low Fall Risk  Fall Risk Patient at Risk for Falls Due to: No Fall Risks Fall risk Follow up: Falls prevention discussed; Falls evaluation completed  Home and Transportation Safety: All  rugs have non-skid backing?: N/A, no rugs All stairs or steps have railings?: N/A, no stairs Grab bars in the bathtub or shower?: (!) no Have non-skid surface in bathtub or shower?: yes Good home lighting?: yes Regular seat belt use?: yes Hospital stays in the last year:: no  Cognitive Assessment Difficulty concentrating, remembering, or making decisions? : no Will 6CIT or Mini Cog be Completed: no 6CIT or Mini Cog Declined: patient alert, oriented, able to answer questions appropriately and recall recent events  Advance Directives (For Healthcare) Does Patient Have a Medical Advance Directive?: No  Reviewed/Updated  Reviewed/Updated: Reviewed All (Medical, Surgical, Family, Medications, Allergies, Care Teams, Patient Goals)    Allergies (verified) Patient has no known allergies.   Current Medications (verified) Outpatient Encounter Medications as of 09/17/2024  Medication Sig   amLODipine  (NORVASC ) 5 MG tablet Take 1 tablet (5 mg total) by mouth daily.   aspirin  EC 81 MG tablet Take 81 mg by mouth daily.   atorvastatin  (LIPITOR) 40 MG tablet Take 1 tablet (40 mg total) by mouth daily.   dapagliflozin  propanediol (FARXIGA ) 10 MG TABS tablet Take 1 tablet (10 mg total) by mouth daily before breakfast.   diclofenac  Sodium (VOLTAREN ) 1 % GEL Apply 2 g topically 4 (four) times daily.   esomeprazole (NEXIUM) 20 MG capsule Take 20 mg by mouth every morning.   ferrous sulfate  324 MG TBEC Take 324 mg by mouth daily.   furosemide  (LASIX ) 20 MG tablet Take 0.5-1 tablets (10-20 mg total) by mouth daily as needed.   gabapentin  (NEURONTIN ) 100 MG capsule Take 1-3 capsules (100-300 mg total) by mouth at bedtime.   lisinopril  (ZESTRIL ) 40 MG tablet Take 1 tablet (40 mg total) by mouth daily.   nitroGLYCERIN  (NITROSTAT )  0.4 MG SL tablet Place 1 tablet (0.4 mg total) under the tongue every 5 (five) minutes as needed for chest pain.   Facility-Administered Encounter Medications as of 09/17/2024   Medication   cyanocobalamin  (VITAMIN B12) injection 1,000 mcg    History: Past Medical History:  Diagnosis Date   Anemia    Arthritis    Cervical radiculopathy 12/22/2019   Colonic polyp    Concussion 03/05/2018   Coronary artery disease    stents x 3 (03-2014)   Diabetes mellitus    Diverticulosis    throughout entire colon   History of colon polyps 01/27/2016   04/2017:  Three sessile polyps were found in the ascending colon. The polyps were 3 mm in size.  These polyps were removed with a cold biopsy forceps. Resection and retrieval were  complete.  - Two sessile polyps were found in the ascending colon. The polyps were 5 to 6 mm in size.  These polyps were removed with a cold snare. Resection and retrieval were complete.  - A 3 mm polyp was found in the    History of total right knee replacement 07/09/2018   HTN (hypertension)    Hypertriglyceridemia    Ingrown toenail 10/21/2019   Kidney stone    Left hamstring injury 11/02/2020   Lymphocytosis    Metatarsal fracture left   left 5th proximal phalanx    Neck pain 02/19/2018   OSA (obstructive sleep apnea)    Mild by records   PONV (postoperative nausea and vomiting)    first time, no problem since then   RBBB 01/2016   Right inguinal hernia 02/03/2016   ? 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   Splenomegaly    STEMI (ST elevation myocardial infarction) [facility])    August 2015   Subarachnoid hematoma (HCC) 03/12/2018   Subarachnoid hemorrhage (HCC) 03/05/2018   Past Surgical History:  Procedure Laterality Date   CARDIAC CATHETERIZATION     stents x 3 (03/2014)   CHOLECYSTECTOMY     INGUINAL HERNIA REPAIR Right 05/27/2015   Procedure: LAPAROSCOPIC REPAIR RIGHT  INGUINAL HERNIA;  Surgeon: Camellia Blush, MD;  Location: [facility] ORS;  Service: General;  Laterality: Right;   INSERTION OF MESH Right 05/27/2015   Procedure: INSERTION OF MESH;  Surgeon: Camellia Blush, MD;  Location: [facility]  ORS;  Service: General;  Laterality: Right;   LUMBAR DISC SURGERY     TONSILLECTOMY AND ADENOIDECTOMY     TOTAL KNEE ARTHROPLASTY Right 07/09/2018   Procedure: RIGHT TOTAL KNEE ARTHROPLASTY;  Surgeon: Anderson Maude ORN, MD;  Location: [facility] OR;  Service: Orthopedics;  Laterality: Right;   Family History  Problem Relation Age of Onset   Kidney disease Sister    Colon cancer Sister 75   Colon cancer Brother    Bone cancer Brother    Arthritis Mother    Breast cancer Mother    Heart disease Mother    Early death Father    Arthritis Son    Esophageal cancer Sister    Bladder Cancer Sister    Rectal cancer Neg Hx    Stomach cancer Neg Hx    Social History   Occupational History   Occupation: retired    Comment: used to work as an chief technology officer; now retired  Tobacco Use   Smoking status: Former    Current packs/day: 0.00    Average packs/day: 1 pack/day for 20.0 years (20.0 ttl pk-yrs)  Types: Cigarettes    Start date: 08/14/1968    Quit date: 08/14/1988    Years since quitting: 36.1    Passive exposure: Past   Smokeless tobacco: Never  Vaping Use   Vaping status: Never Used  Substance and Sexual Activity   Alcohol use: No   Drug use: No   Sexual activity: Yes    Birth control/protection: None   Tobacco Counseling Counseling given: Not Answered  SDOH Screenings   Food Insecurity: No Food Insecurity (09/17/2024)  Housing: Low Risk (09/17/2024)  Transportation Needs: No Transportation Needs (09/17/2024)  Utilities: Not At Risk (09/17/2024)  Alcohol Screen: Low Risk (09/05/2023)  Depression (PHQ2-9): Low Risk (09/17/2024)  Financial Resource Strain: Low Risk (09/05/2023)  Physical Activity: Inactive (09/17/2024)  Social Connections: Moderately Isolated (09/17/2024)  Stress: No Stress Concern Present (09/17/2024)  Tobacco Use: Medium Risk (09/17/2024)  Health Literacy: Adequate Health Literacy (09/17/2024)   See flowsheets for full screening details  Depression Screen PHQ 2 & 9  Depression Scale- Over the past 2 weeks, how often have you been bothered by any of the following problems? Little interest or pleasure in doing things: 0 Feeling down, depressed, or hopeless (PHQ Adolescent also includes...irritable): 0 PHQ-2 Total Score: 0 Trouble falling or staying asleep, or sleeping too much: 0 Feeling tired or having little energy: 0 Poor appetite or overeating (PHQ Adolescent also includes...weight loss): 0 Feeling bad about yourself - or that you are a failure or have let yourself or your family down: 0 Trouble concentrating on things, such as reading the newspaper or watching television (PHQ Adolescent also includes...like school work): 0 Moving or speaking so slowly that other people could have noticed. Or the opposite - being so fidgety or restless that you have been moving around a lot more than usual: 0 Thoughts that you would be better off dead, or of hurting yourself in some way: 0 PHQ-9 Total Score: 0 If you checked off any problems, how difficult have these problems made it for you to do your work, take care of things at home, or get along with other people?: Not difficult at all     Goals Addressed               This Visit's Progress     Patient Stated (pt-stated)        Continue current lifestyle /2026             Objective:    Today's Vitals   09/17/24 0819  BP: 129/78  Pulse: 64  Temp: 97.7 F (36.5 C)  SpO2: 95%  Weight: 173 lb 9.6 oz (78.7 kg)  Height: 5' 10.5 (1.791 m)   Body mass index is 24.56 kg/m.  Hearing/Vision screen Hearing Screening - Comments:: Denies hearing difficulties   Vision Screening - Comments:: Wears eyeglasses/[place] [facility]/UTD Immunizations and Health Maintenance Health Maintenance  Topic Date Due   OPHTHALMOLOGY EXAM  12/09/2024   HEMOGLOBIN A1C  03/11/2025   Diabetic kidney evaluation - eGFR measurement  09/08/2025   Diabetic kidney evaluation - Urine ACR  09/11/2025   FOOT EXAM  09/11/2025    Medicare Annual Wellness (AWV)  09/17/2025   Pneumococcal Vaccine: 50+ Years  Completed   Influenza Vaccine  Completed   Zoster Vaccines- Shingrix  Completed   Meningococcal B Vaccine  Aged Out   DTaP/Tdap/Td  Discontinued   Colonoscopy  Discontinued   COVID-19 Vaccine  Discontinued        Assessment/Plan:  This is a routine wellness examination  for [employer].  Patient Care Team: Catherine Charlies LABOR, DO as PCP - General (Family Medicine) Lonn Hicks, MD as Consulting Physician (Hematology and Oncology) Carles Gear, MD as Consulting Physician (Neurosurgery) Elspeth Lauraine DEL, OD as Referring Physician Gershon Donnice SAUNDERS, DPM as Consulting Physician (Podiatry) Alyse Agent, MD as Consulting Physician (Orthopedic Surgery) Margaret Eduard SAUNDERS, MD as Consulting Physician (Neurology) Jeffrie Oneil BROCKS, MD as Consulting Physician (Cardiology)  I have personally reviewed and noted the following in the patients chart:   Medical and social history Use of alcohol, tobacco or illicit drugs  Current medications and supplements including opioid prescriptions. Functional ability and status Nutritional status Physical activity Advanced directives List of other physicians Hospitalizations, surgeries, and ER visits in previous 12 months Vitals Screenings to include cognitive, depression, and falls Referrals and appointments  No orders of the defined types were placed in this encounter.  In addition, I have reviewed and discussed with patient certain preventive protocols, quality metrics, and best practice recommendations. A written personalized care plan for preventive services as well as general preventive health recommendations were provided to patient.   Riese Hellard L Ehren Berisha, CMA   09/17/2024   Return in 1 year (on 09/17/2025).  After Visit Summary: (MyChart) Due to this being a telephonic visit, the after visit summary with patients personalized plan was offered to patient via MyChart   Nurse  Notes: HM Addressed: Vaccines Due: Tdap.  Patient had no concerns to address today. "

## 2024-10-13 ENCOUNTER — Ambulatory Visit

## 2024-11-24 ENCOUNTER — Ambulatory Visit: Admitting: Podiatry

## 2024-12-16 ENCOUNTER — Ambulatory Visit: Admitting: Hematology and Oncology

## 2024-12-16 ENCOUNTER — Other Ambulatory Visit

## 2025-02-26 ENCOUNTER — Encounter: Admitting: Family Medicine

## 2025-09-23 ENCOUNTER — Ambulatory Visit
